# Patient Record
Sex: Female | Born: 1950 | Race: White | Hispanic: No | Marital: Married | Smoking: Former smoker
Health system: Southern US, Community
[De-identification: ages and names within clinical notes are randomized; demographics above are authoritative.]

## PROBLEM LIST (undated history)

## (undated) DIAGNOSIS — K589 Irritable bowel syndrome without diarrhea: Secondary | ICD-10-CM

## (undated) DIAGNOSIS — Z86010 Personal history of colonic polyps: Secondary | ICD-10-CM

## (undated) DIAGNOSIS — K529 Noninfective gastroenteritis and colitis, unspecified: Secondary | ICD-10-CM

## (undated) DIAGNOSIS — E119 Type 2 diabetes mellitus without complications: Secondary | ICD-10-CM

## (undated) DIAGNOSIS — A0472 Enterocolitis due to Clostridium difficile, not specified as recurrent: Secondary | ICD-10-CM

## (undated) DIAGNOSIS — I10 Essential (primary) hypertension: Secondary | ICD-10-CM

## (undated) DIAGNOSIS — I341 Nonrheumatic mitral (valve) prolapse: Secondary | ICD-10-CM

## (undated) DIAGNOSIS — E7800 Pure hypercholesterolemia, unspecified: Secondary | ICD-10-CM

## (undated) DIAGNOSIS — E1165 Type 2 diabetes mellitus with hyperglycemia: Secondary | ICD-10-CM

## (undated) DIAGNOSIS — F32A Depression, unspecified: Secondary | ICD-10-CM

## (undated) DIAGNOSIS — K7460 Unspecified cirrhosis of liver: Secondary | ICD-10-CM

## (undated) DIAGNOSIS — F329 Major depressive disorder, single episode, unspecified: Secondary | ICD-10-CM

## (undated) DIAGNOSIS — R600 Localized edema: Secondary | ICD-10-CM

## (undated) DIAGNOSIS — M1990 Unspecified osteoarthritis, unspecified site: Secondary | ICD-10-CM

## (undated) DIAGNOSIS — D696 Thrombocytopenia, unspecified: Secondary | ICD-10-CM

## (undated) DIAGNOSIS — B9681 Helicobacter pylori [H. pylori] as the cause of diseases classified elsewhere: Secondary | ICD-10-CM

## (undated) DIAGNOSIS — N816 Rectocele: Secondary | ICD-10-CM

## (undated) DIAGNOSIS — K2100 Gastro-esophageal reflux disease with esophagitis, without bleeding: Secondary | ICD-10-CM

## (undated) DIAGNOSIS — D649 Anemia, unspecified: Secondary | ICD-10-CM

## (undated) DIAGNOSIS — K5900 Constipation, unspecified: Secondary | ICD-10-CM

## (undated) DIAGNOSIS — K2970 Gastritis, unspecified, without bleeding: Secondary | ICD-10-CM

## (undated) DIAGNOSIS — T7840XA Allergy, unspecified, initial encounter: Secondary | ICD-10-CM

## (undated) DIAGNOSIS — F419 Anxiety disorder, unspecified: Secondary | ICD-10-CM

## (undated) DIAGNOSIS — D509 Iron deficiency anemia, unspecified: Secondary | ICD-10-CM

## (undated) DIAGNOSIS — E785 Hyperlipidemia, unspecified: Secondary | ICD-10-CM

## (undated) HISTORY — DX: Enterocolitis due to Clostridium difficile, not specified as recurrent: A04.72

## (undated) HISTORY — DX: Anxiety disorder, unspecified: F41.9

## (undated) HISTORY — DX: Pure hypercholesterolemia, unspecified: E78.00

## (undated) HISTORY — DX: Personal history of colonic polyps: Z86.010

## (undated) HISTORY — DX: Gastro-esophageal reflux disease with esophagitis, without bleeding: K21.00

## (undated) HISTORY — DX: Gastritis, unspecified, without bleeding: K29.70

## (undated) HISTORY — DX: Allergy, unspecified, initial encounter: T78.40XA

## (undated) HISTORY — PX: TUBAL LIGATION: SHX77

## (undated) HISTORY — DX: Thrombocytopenia, unspecified: D69.6

## (undated) HISTORY — DX: Unspecified cirrhosis of liver: K74.60

## (undated) HISTORY — DX: Type 2 diabetes mellitus without complications: E11.9

## (undated) HISTORY — DX: Helicobacter pylori (H. pylori) as the cause of diseases classified elsewhere: B96.81

## (undated) HISTORY — DX: Iron deficiency anemia, unspecified: D50.9

## (undated) HISTORY — PX: BILATERAL SALPINGOOPHORECTOMY: SHX1223

## (undated) HISTORY — PX: COLONOSCOPY: SHX174

## (undated) HISTORY — DX: Constipation, unspecified: K59.00

## (undated) HISTORY — DX: Rectocele: N81.6

## (undated) HISTORY — DX: Type 2 diabetes mellitus with hyperglycemia: E11.65

## (undated) HISTORY — PX: ESOPHAGOGASTRODUODENOSCOPY: SHX1529

## (undated) HISTORY — DX: Noninfective gastroenteritis and colitis, unspecified: K52.9

## (undated) HISTORY — DX: Major depressive disorder, single episode, unspecified: F32.9

## (undated) HISTORY — DX: Localized edema: R60.0

## (undated) HISTORY — PX: ABDOMINAL HYSTERECTOMY: SHX81

## (undated) HISTORY — DX: Unspecified osteoarthritis, unspecified site: M19.90

## (undated) HISTORY — DX: Irritable bowel syndrome, unspecified: K58.9

## (undated) HISTORY — DX: Depression, unspecified: F32.A

## (undated) HISTORY — DX: Anemia, unspecified: D64.9

## (undated) HISTORY — DX: Nonrheumatic mitral (valve) prolapse: I34.1

## (undated) HISTORY — DX: Hyperlipidemia, unspecified: E78.5

---

## 2001-07-29 ENCOUNTER — Encounter: Payer: Self-pay | Admitting: Specialist

## 2001-07-29 ENCOUNTER — Ambulatory Visit (HOSPITAL_COMMUNITY): Admission: RE | Admit: 2001-07-29 | Discharge: 2001-07-29 | Payer: Self-pay | Admitting: Specialist

## 2002-09-08 ENCOUNTER — Ambulatory Visit (HOSPITAL_COMMUNITY): Admission: RE | Admit: 2002-09-08 | Discharge: 2002-09-08 | Payer: Self-pay | Admitting: Specialist

## 2002-09-08 ENCOUNTER — Encounter: Payer: Self-pay | Admitting: Specialist

## 2004-05-29 ENCOUNTER — Ambulatory Visit (HOSPITAL_COMMUNITY): Admission: RE | Admit: 2004-05-29 | Discharge: 2004-05-29 | Payer: Self-pay | Admitting: *Deleted

## 2005-01-26 ENCOUNTER — Ambulatory Visit (HOSPITAL_COMMUNITY): Admission: RE | Admit: 2005-01-26 | Discharge: 2005-01-26 | Payer: Self-pay | Admitting: Pulmonary Disease

## 2005-06-14 ENCOUNTER — Ambulatory Visit (HOSPITAL_COMMUNITY): Admission: RE | Admit: 2005-06-14 | Discharge: 2005-06-14 | Payer: Self-pay | Admitting: *Deleted

## 2005-09-28 ENCOUNTER — Ambulatory Visit (HOSPITAL_COMMUNITY): Admission: RE | Admit: 2005-09-28 | Discharge: 2005-09-28 | Payer: Self-pay | Admitting: Pulmonary Disease

## 2006-07-23 ENCOUNTER — Ambulatory Visit (HOSPITAL_COMMUNITY): Admission: RE | Admit: 2006-07-23 | Discharge: 2006-07-23 | Payer: Self-pay | Admitting: Obstetrics and Gynecology

## 2006-10-22 ENCOUNTER — Ambulatory Visit (HOSPITAL_COMMUNITY): Payer: Self-pay | Admitting: Psychiatry

## 2006-11-21 ENCOUNTER — Ambulatory Visit (HOSPITAL_COMMUNITY): Payer: Self-pay | Admitting: Psychiatry

## 2007-01-21 ENCOUNTER — Ambulatory Visit (HOSPITAL_COMMUNITY): Payer: Self-pay | Admitting: Psychiatry

## 2007-02-03 ENCOUNTER — Ambulatory Visit: Payer: Self-pay | Admitting: Internal Medicine

## 2007-02-04 ENCOUNTER — Ambulatory Visit (HOSPITAL_COMMUNITY): Payer: Self-pay | Admitting: Psychiatry

## 2007-03-04 ENCOUNTER — Ambulatory Visit (HOSPITAL_COMMUNITY): Payer: Self-pay | Admitting: Psychiatry

## 2007-04-24 ENCOUNTER — Ambulatory Visit (HOSPITAL_COMMUNITY): Payer: Self-pay | Admitting: Psychiatry

## 2007-04-30 ENCOUNTER — Ambulatory Visit (HOSPITAL_BASED_OUTPATIENT_CLINIC_OR_DEPARTMENT_OTHER): Admission: RE | Admit: 2007-04-30 | Discharge: 2007-04-30 | Payer: Self-pay | Admitting: *Deleted

## 2007-06-24 ENCOUNTER — Ambulatory Visit (HOSPITAL_COMMUNITY): Payer: Self-pay | Admitting: Psychiatry

## 2007-08-04 ENCOUNTER — Ambulatory Visit (HOSPITAL_COMMUNITY): Admission: RE | Admit: 2007-08-04 | Discharge: 2007-08-04 | Payer: Self-pay | Admitting: Obstetrics and Gynecology

## 2007-08-19 ENCOUNTER — Ambulatory Visit (HOSPITAL_COMMUNITY): Payer: Self-pay | Admitting: Psychiatry

## 2007-09-24 ENCOUNTER — Ambulatory Visit: Payer: Self-pay | Admitting: Internal Medicine

## 2007-09-24 LAB — CONVERTED CEMR LAB
ALT: 75 units/L — ABNORMAL HIGH (ref 0–35)
HCV Ab: NEGATIVE
Hepatitis B Surface Ag: NEGATIVE
Total Protein: 8 g/dL (ref 6.0–8.3)

## 2007-09-29 ENCOUNTER — Ambulatory Visit (HOSPITAL_COMMUNITY): Admission: RE | Admit: 2007-09-29 | Discharge: 2007-09-29 | Payer: Self-pay | Admitting: Internal Medicine

## 2007-10-21 ENCOUNTER — Ambulatory Visit (HOSPITAL_COMMUNITY): Payer: Self-pay | Admitting: Psychiatry

## 2007-10-22 ENCOUNTER — Ambulatory Visit: Payer: Self-pay | Admitting: Internal Medicine

## 2007-10-22 LAB — CONVERTED CEMR LAB
Albumin: 4 g/dL (ref 3.5–5.2)
Alkaline Phosphatase: 62 units/L (ref 39–117)
Bilirubin, Direct: 0.1 mg/dL (ref 0.0–0.3)
Total Bilirubin: 0.7 mg/dL (ref 0.3–1.2)

## 2007-11-17 ENCOUNTER — Ambulatory Visit: Payer: Self-pay | Admitting: Internal Medicine

## 2007-11-17 LAB — CONVERTED CEMR LAB
Hemoglobin: 13.2 g/dL (ref 12.0–15.0)
Lymphocytes Relative: 33.7 % (ref 12.0–46.0)
Monocytes Relative: 7.4 % (ref 3.0–11.0)
Neutrophils Relative %: 58.3 % (ref 43.0–77.0)
Platelets: 188 10*3/uL (ref 150–400)
RBC: 4.61 M/uL (ref 3.87–5.11)
RDW: 12.7 % (ref 11.5–14.6)
Total CK: 101 units/L (ref 7–177)
WBC: 7.7 10*3/uL (ref 4.5–10.5)

## 2007-11-25 ENCOUNTER — Ambulatory Visit: Payer: Self-pay | Admitting: Internal Medicine

## 2007-11-25 ENCOUNTER — Encounter: Payer: Self-pay | Admitting: Internal Medicine

## 2007-11-25 LAB — CONVERTED CEMR LAB
Alpha-1-Globulin: 3.1 % (ref 2.9–4.9)
Alpha-2-Globulin: 10.2 % (ref 7.1–11.8)
Beta Globulin: 7.4 % — ABNORMAL HIGH (ref 4.7–7.2)
Ceruloplasmin: 44 mg/dL (ref 21–63)
Gamma Globulin: 17.4 % (ref 11.1–18.8)

## 2008-01-09 ENCOUNTER — Encounter (HOSPITAL_COMMUNITY): Admission: RE | Admit: 2008-01-09 | Discharge: 2008-02-08 | Payer: Self-pay | Admitting: Oncology

## 2008-01-09 ENCOUNTER — Ambulatory Visit (HOSPITAL_COMMUNITY): Payer: Self-pay | Admitting: Oncology

## 2008-01-20 ENCOUNTER — Ambulatory Visit (HOSPITAL_COMMUNITY): Payer: Self-pay | Admitting: Psychiatry

## 2008-02-06 DIAGNOSIS — E119 Type 2 diabetes mellitus without complications: Secondary | ICD-10-CM | POA: Insufficient documentation

## 2008-02-06 DIAGNOSIS — F411 Generalized anxiety disorder: Secondary | ICD-10-CM | POA: Insufficient documentation

## 2008-02-06 DIAGNOSIS — G5600 Carpal tunnel syndrome, unspecified upper limb: Secondary | ICD-10-CM | POA: Insufficient documentation

## 2008-02-06 DIAGNOSIS — F329 Major depressive disorder, single episode, unspecified: Secondary | ICD-10-CM

## 2008-02-06 DIAGNOSIS — E663 Overweight: Secondary | ICD-10-CM

## 2008-02-06 DIAGNOSIS — E785 Hyperlipidemia, unspecified: Secondary | ICD-10-CM | POA: Insufficient documentation

## 2008-03-15 ENCOUNTER — Ambulatory Visit (HOSPITAL_COMMUNITY): Payer: Self-pay | Admitting: Oncology

## 2008-03-15 ENCOUNTER — Encounter (HOSPITAL_COMMUNITY): Admission: RE | Admit: 2008-03-15 | Discharge: 2008-04-14 | Payer: Self-pay | Admitting: Oncology

## 2008-04-20 ENCOUNTER — Ambulatory Visit (HOSPITAL_COMMUNITY): Payer: Self-pay | Admitting: Psychiatry

## 2008-07-13 ENCOUNTER — Ambulatory Visit (HOSPITAL_COMMUNITY): Payer: Self-pay | Admitting: Psychiatry

## 2008-09-07 ENCOUNTER — Ambulatory Visit (HOSPITAL_COMMUNITY): Admission: RE | Admit: 2008-09-07 | Discharge: 2008-09-07 | Payer: Self-pay | Admitting: Obstetrics & Gynecology

## 2008-09-09 ENCOUNTER — Ambulatory Visit (HOSPITAL_COMMUNITY): Payer: Self-pay | Admitting: Psychiatry

## 2008-11-09 ENCOUNTER — Ambulatory Visit (HOSPITAL_COMMUNITY): Payer: Self-pay | Admitting: Psychiatry

## 2009-02-08 ENCOUNTER — Ambulatory Visit (HOSPITAL_COMMUNITY): Payer: Self-pay | Admitting: Psychiatry

## 2009-02-21 DIAGNOSIS — E785 Hyperlipidemia, unspecified: Secondary | ICD-10-CM

## 2009-02-21 HISTORY — DX: Hyperlipidemia, unspecified: E78.5

## 2009-03-15 ENCOUNTER — Ambulatory Visit (HOSPITAL_COMMUNITY): Payer: Self-pay | Admitting: Psychiatry

## 2009-04-14 ENCOUNTER — Ambulatory Visit (HOSPITAL_COMMUNITY): Payer: Self-pay | Admitting: Psychiatry

## 2009-05-03 ENCOUNTER — Encounter: Payer: Self-pay | Admitting: Cardiology

## 2009-05-12 ENCOUNTER — Ambulatory Visit (HOSPITAL_COMMUNITY): Payer: Self-pay | Admitting: Psychiatry

## 2009-07-12 ENCOUNTER — Ambulatory Visit (HOSPITAL_COMMUNITY): Payer: Self-pay | Admitting: Psychiatry

## 2009-08-04 ENCOUNTER — Ambulatory Visit (HOSPITAL_COMMUNITY): Payer: Self-pay | Admitting: Psychiatry

## 2009-08-23 ENCOUNTER — Ambulatory Visit (HOSPITAL_COMMUNITY): Payer: Self-pay | Admitting: Psychiatry

## 2009-10-04 ENCOUNTER — Ambulatory Visit (HOSPITAL_COMMUNITY): Payer: Self-pay | Admitting: Psychiatry

## 2009-10-12 ENCOUNTER — Ambulatory Visit (HOSPITAL_COMMUNITY): Admission: RE | Admit: 2009-10-12 | Discharge: 2009-10-12 | Payer: Self-pay | Admitting: Pulmonary Disease

## 2009-11-03 ENCOUNTER — Ambulatory Visit (HOSPITAL_COMMUNITY): Payer: Self-pay | Admitting: Psychiatry

## 2009-11-28 ENCOUNTER — Ambulatory Visit (HOSPITAL_COMMUNITY): Admission: RE | Admit: 2009-11-28 | Discharge: 2009-11-28 | Payer: Self-pay | Admitting: Pulmonary Disease

## 2009-11-30 ENCOUNTER — Encounter: Payer: Self-pay | Admitting: Internal Medicine

## 2009-12-17 HISTORY — PX: OTHER SURGICAL HISTORY: SHX169

## 2010-01-10 ENCOUNTER — Ambulatory Visit (HOSPITAL_COMMUNITY): Payer: Self-pay | Admitting: Psychiatry

## 2010-03-15 ENCOUNTER — Encounter (INDEPENDENT_AMBULATORY_CARE_PROVIDER_SITE_OTHER): Payer: Self-pay | Admitting: Cardiology

## 2010-03-16 ENCOUNTER — Ambulatory Visit (HOSPITAL_COMMUNITY): Payer: Self-pay | Admitting: Psychiatry

## 2010-05-01 ENCOUNTER — Encounter: Payer: Self-pay | Admitting: Internal Medicine

## 2010-05-08 ENCOUNTER — Encounter: Payer: Self-pay | Admitting: Internal Medicine

## 2010-06-15 ENCOUNTER — Ambulatory Visit (HOSPITAL_COMMUNITY): Payer: Self-pay | Admitting: Psychiatry

## 2010-06-27 ENCOUNTER — Encounter (INDEPENDENT_AMBULATORY_CARE_PROVIDER_SITE_OTHER): Payer: Self-pay | Admitting: *Deleted

## 2010-06-27 ENCOUNTER — Ambulatory Visit: Payer: Self-pay | Admitting: Internal Medicine

## 2010-06-27 DIAGNOSIS — D509 Iron deficiency anemia, unspecified: Secondary | ICD-10-CM | POA: Insufficient documentation

## 2010-06-27 DIAGNOSIS — D696 Thrombocytopenia, unspecified: Secondary | ICD-10-CM | POA: Insufficient documentation

## 2010-08-18 ENCOUNTER — Ambulatory Visit: Payer: Self-pay | Admitting: Internal Medicine

## 2010-10-10 ENCOUNTER — Ambulatory Visit (HOSPITAL_COMMUNITY): Payer: Self-pay | Admitting: Psychiatry

## 2010-10-26 ENCOUNTER — Ambulatory Visit (HOSPITAL_COMMUNITY)
Admission: RE | Admit: 2010-10-26 | Discharge: 2010-10-26 | Payer: Self-pay | Source: Home / Self Care | Admitting: Obstetrics & Gynecology

## 2011-01-02 ENCOUNTER — Ambulatory Visit (HOSPITAL_COMMUNITY)
Admission: RE | Admit: 2011-01-02 | Discharge: 2011-01-02 | Payer: Self-pay | Source: Home / Self Care | Attending: Psychiatry | Admitting: Psychiatry

## 2011-01-07 ENCOUNTER — Encounter: Payer: Self-pay | Admitting: Obstetrics and Gynecology

## 2011-01-16 ENCOUNTER — Ambulatory Visit (HOSPITAL_COMMUNITY)
Admission: RE | Admit: 2011-01-16 | Discharge: 2011-01-16 | Payer: Self-pay | Source: Home / Self Care | Attending: Psychiatry | Admitting: Psychiatry

## 2011-01-18 NOTE — Procedures (Signed)
Summary: Upper Endoscopy  Patient: [name] Note: All result statuses are Final unless otherwise noted.  Tests: (1) Upper Endoscopy (EGD)   EGD Upper Endoscopy       DONE     [facility]     [address]     [place], [place]  [address]           ENDOSCOPY PROCEDURE REPORT           PATIENT:  [name] [name], [name] [name]  MR#:  [id number]     BIRTHDATE:  07-02-1951, 58 yrs. old  GENDER:  female           ENDOSCOPIST:  [name], MD, [facility]           PROCEDURE DATE:  08/18/2010     PROCEDURE:  EGD, diagnostic     ASA CLASS:  Class II     INDICATIONS:  iron deficiency anemia prior colonoscopy 2008     negative           MEDICATIONS:   Fentanyl 25 mcg IV, Versed 4 mg IV     TOPICAL ANESTHETIC:  Exactacain Spray           DESCRIPTION OF PROCEDURE:   After the risks benefits and     alternatives of the procedure were thoroughly explained, informed     consent was obtained.  The LB GIF-H180 [id number] endoscope was     introduced through the mouth and advanced to the second portion of     the duodenum, without limitations.  The instrument was slowly     withdrawn as the mucosa was fully examined.     <<PROCEDUREIMAGES>>           Erythema was found in the antrum. Streaky erythema seen.     Otherwise the examination was normal. Z-line at 40 cm.     Retroflexed views revealed no abnormalities.    The scope was then     withdrawn from the patient and the procedure completed.           COMPLICATIONS:  None           ENDOSCOPIC IMPRESSION:     1) Erythema in the antrum     2) Otherwise normal examination     RECOMMENDATIONS:     1) Continue ferrous sulfate     2) return to Dr. Juanetta Gosling sometime this month re: follow-up     anemia     3) she says she performed hemoccults but I have not received the     results from Dr. Juanetta Gosling' office; if these were + would repeat a     colonoscopy, if negative, no further work-up. Defer follow-up of     hemoccults to Dr. Juanetta Gosling     4)  NOTE: ferritin was only 30 in 2008 so not necessarily a new     problem           REPEAT EXAM:  as needed           [name], MD, [facility]           CC:  Shaune Pollack, MD, The Patient           n.     Rosalie Doctor:   [name] at 08/18/2010 08:49 AM           Cassandria Anger, [id number]  Note: An exclamation mark (!) indicates a result that was not dispersed into the flowsheet. Document Creation  Date: 08/18/2010 8:49 AM _______________________________________________________________________  (1) Order result status: Final Collection or observation date-time: 08/18/2010 08:38 Requested date-time:  Receipt date-time:  Reported date-time:  Referring Physician:   Ordering Physician: Stan Head [phone]) Specimen Source:  Source: Launa Grill Order Number: [id number] Lab site:

## 2011-01-18 NOTE — Letter (Signed)
Summary: Diabetic Instructions  [facility]  [address] [place], [place] [address]   Phone: [phone]  Fax: [phone]    JASHLEY YELLIN [birth year] MRN: [id number]   _X_   ORAL DIABETIC MEDICATION INSTRUCTIONS  The day before your procedure:   Take your diabetic pill as you do normally  The day of your procedure:   Do not take your diabetic pill    We will check your blood sugar levels during the admission process and again in Recovery before discharging you home

## 2011-01-18 NOTE — Assessment & Plan Note (Signed)
Summary: low iron level--ch.    History of Present Illness Visit Type: Follow-up Consult Primary GI MD: Stan Head MD [facility] Primary Provider: Kari Baars, MD Requesting Provider: Kari Baars, MD Chief Complaint: Iron def History of Present Illness:   Patient feels weak and tired due to her iron def. She denies any other complaints.   She has been found to be anemic and iron-deficient by Dr. Juanetta Gosling in the past few months. No bleeding or GI symptoms and is s/p hysterectomy. she probably does not consume much iron in her diet.  Colonoscopy and TTG Ab negative in 2008.  Started iron about 1 month ago lab f/u yesterday - results pending   GI Review of Systems      Denies abdominal pain, acid reflux, belching, bloating, chest pain, dysphagia with liquids, dysphagia with solids, heartburn, loss of appetite, nausea, vomiting, vomiting blood, weight loss, and  weight gain.        Denies anal fissure, black tarry stools, change in bowel habit, constipation, diarrhea, diverticulosis, fecal incontinence, heme positive stool, hemorrhoids, irritable bowel syndrome, jaundice, light color stool, liver problems, rectal bleeding, and  rectal pain. Preventive Screening-Counseling & Management  Alcohol-Tobacco     Smoking Status: quit      Drug Use:  no.      Current Medications (verified): 1)  Welchol 625 Mg Tabs (Colesevelam Hcl) .... Take 3 Tablets By Mouth Twice A Day With Meals 2)  Dicyclomine Hcl 20 Mg Tabs (Dicyclomine Hcl) .... Take One By Mouth Three Times A Day 3)  Lovaza 1 Gm Caps (Omega-3-Acid Ethyl Esters) .... Take One By Mouth Two Times A Day 4)  Metformin Hcl 500 Mg Tabs (Metformin Hcl) .... Take One By Mouth Once Daily and One By Mouth Two Times A Day Every Other Day 5)  Actos 15 Mg Tabs (Pioglitazone Hcl) .... Take One By Mouth Once Daily 6)  Atenolol 25 Mg Tabs (Atenolol) .... Take 1.5 Tablets By Mouth Once Daily 7)  Nexium 40 Mg Cpdr (Esomeprazole Magnesium) ....  Take One By Mouth Once Daily 8)  Effexor Xr 75 Mg Xr24h-Cap (Venlafaxine Hcl) .... Take One By Mouth in The Morning and Two By Mouth Every Night 9)  Zetia 10 Mg Tabs (Ezetimibe) .... Take One By Mouth Every Other Day 10)  Cinnamon 1000 Mg Caps (Cinnamon) .... Take Two By Mouth Once Daily 11)  Iron 325 (65 Fe) Mg Tabs (Ferrous Sulfate) .... Take One By Mouth Once Daily 12)  Co Q-10 200 Mg Caps (Coenzyme Q10) .... Take One By Mouth Once Daily 13)  Biotin 1000 Mcg Tabs (Biotin) .... Take One By Mouth Once Daily 14)  Allergy 4 Mg Tabs (Chlorpheniramine Maleate) .... Take One By Mouth Once Daily  Allergies (verified): 1)  ! Codeine  Past History:  Past Medical History: Anemia Fatty Liver suspected (abnl transaminases and echodense liver on [place]) Anxiety and Panic Disorder Depression Diabetes II Hyperlipidemia Mitral Valve Prolapse IBS  Past Surgical History: Carpal Tunnel Release Hysterectomy  Family History: maternal uncles had cancer unknown type  Family History of Heart Disease: maternal family No FH of Colon Cancer:  Social History: Patient is a former smoker.  Alcohol Use - no Illicit Drug Use - no Smoking Status:  quit Drug Use:  no  Review of Systems       All other ROS negative except as per HPI.   Vital Signs:  Patient profile:   60 year old female Height:      72  inches Weight:      147.2 pounds BMI:     26.17 Pulse rate:   78 / minute Pulse rhythm:   regular BP sitting:   122 / 52  (right arm) Cuff size:   regular  Vitals Entered By: Harlow Mares CMA Duncan Dull) (June 27, 2010 11:25 AM)  Physical Exam  General:  Well developed, well nourished, no acute distress. Eyes:  anicteric Lungs:  Clear throughout to auscultation. Heart:  Regular rate and rhythm; no murmurs, rubs,  or bruits. Abdomen:  Soft, nontender and nondistended. No masses, hepatosplenomegaly or hernias noted. Normal bowel sounds. Skin:  1 ? spider Psych:  Alert and cooperative. Normal  mood and affect.   Impression & Recommendations:  Problem # 1:  ANEMIA, IRON DEFICIENCY (ICD-280.9) Assessment New review labs from Dr. Juanetta Gosling after iron started ? if he has done hemoccults - she thinks she may have negative colonoscopy 2008 so not doing that now but would depend on work-up we do now and clinical course as to whether it is repeated   Orders: EGD (EGD)  Problem # 2:  THROMBOCYTOPENIA (ICD-287.5) Assessment: New raises ? of liver disease - has had abnormal transaminases (not now) and ? of fatty liver will see if signs of portal htn at EGD  Patient Instructions: 1)  We will see you at your procedure on 08/29/10. 2)  Please hold diabetic medications as directed. 3)  [facility] Patient Information Guide given to patient.  4)  Upper Endoscopy brochure given.  5)  The medication list was reviewed and reconciled.  All changed / newly prescribed medications were explained.  A complete medication list was provided to the patient / caregiver.  Appended Document: low iron level--ch. No hemoccults forwarded after record request, only same labs already had

## 2011-01-18 NOTE — Letter (Signed)
Summary: Fredirick Maudlin MD  Fredirick Maudlin MD   Imported By: Lester [place] 07/07/2010 09:01:06  _____________________________________________________________________  External Attachment:    Type:   Image     Comment:   External Document

## 2011-01-18 NOTE — Procedures (Signed)
Summary: Colonoscopy: Normal   Colonoscopy  Procedure date:  11/25/2007  Findings:      Results: Normal. Location:  [facility].    Procedures Next Due Date:    Colonoscopy: 11/2017  Patient Name: [name] [name], Whisman MRN: [id number] Procedure Procedures: Colonoscopy CPT: 40981.    with biopsy. CPT: Q5068410.  Personnel: Endoscopist: [name], MD, [facility] [place].  Exam Location: Exam performed in Outpatient Clinic. Outpatient  Patient Consent: Procedure, Alternatives, Risks and Benefits discussed, consent obtained, from patient. Consent was obtained by the RN.  Indications  Average Risk Screening Routine.  History  Current Medications: Patient is not currently taking Coumadin.  Allergies: Allergic to CODEINE, JANUVIA.  Pre-Exam Physical: Performed Nov 25, 2007. Cardio-pulmonary exam, Rectal exam, HEENT exam , Abdominal exam, Mental status exam WNL.  Comments: Pt. history reviewed/updated, physical exam performed prior to initiation of sedation? YES Exam Exam: Extent of exam reached: Cecum, extent intended: Cecum.  The cecum was identified by appendiceal orifice and IC valve. Patient position: on left side. Time to Cecum: 00:05:55. Time for Withdrawl: 00:10:41. Colon retroflexion performed. Images taken. ASA Classification: II. Tolerance: excellent.  Monitoring: Pulse and BP monitoring, Oximetry used. Supplemental O2 given.  Colon Prep Used MiraLax for colon prep. Prep results: excellent.  Sedation Meds: Patient assessed and found to be appropriate for moderate (conscious) sedation. Fentanyl 75 mcg. given IV. Versed 6 mg. given IV.  Findings - NORMAL EXAM: Ascending Colon to Rectum.  OTHER FINDING: ? polypoid change of ileo-cecal valve found in Cecum. Biopsy/Other Finding taken.   Assessment  Comments: 1) POSSIBLE POLYPOID CHANGES AT ILEOCECAL VALVE 2) OTHERWISE NORMAL EXAM 3) EXCELLENT PREP Events  Unplanned Interventions: No intervention was  required.  Plans Medication Plan: Await pathology.  Disposition: After procedure patient sent to recovery. After recovery patient sent home.  Scheduling/Referral: Await pathology to schedule patient. Path Letter, to The Patient,   Comments: 1. IF THERE IS A POLYP AT ILEOCECAL VALVE, DOUBT IT COULD BE SNARED. SUSPECT IT IS REALLY NORMAL VARIANT. AWAIT BIOPSIES.  2. AMA, SPEP, CERULOPLASMIN, ANTI-SMOOTH MUSCLE ANTIBODY TODAY TO FURTHER WORK-UP ABNL LFT'S (790.4)  CC:   Kari Baars, MD  This report was created from the original endoscopy report, which was reviewed and signed by the above listed endoscopist.

## 2011-01-18 NOTE — Letter (Signed)
Summary: EGD Instructions  [facility]  [address] [place], [place] [address]   Phone: [phone]  Fax: [phone]       [name] [name]    Jul 26, 1951    MRN: [id number]       Procedure Day Dorna Bloom: [name], 08/18/10     Arrival Time: 7:30 AM     Procedure Time: 8:30 AM     Location of Procedure:                    _X_  [facility] (4th Floor)  PREPARATION FOR ENDOSCOPY   On FRIDAY, 08/18/10 THE DAY OF THE PROCEDURE:  1.   No solid foods, milk or milk products are allowed after midnight the night before your procedure.  2.   Do not drink anything colored red or purple.  Avoid juices with pulp.  No orange juice.  3.  You may drink clear liquids until 6:30 AM, which is 2 hours before your procedure.                                                                                                CLEAR LIQUIDS INCLUDE: Water Jello Ice Popsicles Tea (sugar ok, no milk/cream) Powdered fruit flavored drinks Coffee (sugar ok, no milk/cream) Gatorade Juice: apple, white grape, white cranberry  Lemonade Clear bullion, consomm, broth Carbonated beverages (any kind) Strained chicken noodle soup Hard Candy   MEDICATION INSTRUCTIONS  Unless otherwise instructed, you should take regular prescription medications with a small sip of water as early as possible the morning of your procedure.  Diabetic patients - see separate instructions.                 OTHER INSTRUCTIONS  You will need a responsible adult at least 58 years of age to accompany you and drive you home.   This person must remain in the waiting room during your procedure.  Wear loose fitting clothing that is easily removed.  Leave jewelry and other valuables at home.  However, you may wish to bring a book to read or an iPod/MP3 player to listen to music as you wait for your procedure to start.  Remove all body piercing jewelry and leave at home.  Total time from sign-in until  discharge is approximately 2-3 hours.  You should go home directly after your procedure and rest.  You can resume normal activities the day after your procedure.  The day of your procedure you should not:   Drive   Make legal decisions   Operate machinery   Drink alcohol   Return to work  You will receive specific instructions about eating, activities and medications before you leave.    The above instructions have been reviewed and explained to me by   Francee Piccolo, CMA (AAMA)     I fully understand and can verbalize these instructions _____________________________ Date 06/27/10

## 2011-01-18 NOTE — Miscellaneous (Signed)
Summary: letter not picked up from 10/2008 results--returned from front   Clinical Lists Changes     Note returned to me today from Samples drawer at front desk that Ms. Cassels did not pick up results letter dated 10/19/2008 and plan for care.  Sent to med rec to be scanned.  [name] Dubin, PharmD

## 2011-02-13 ENCOUNTER — Encounter (INDEPENDENT_AMBULATORY_CARE_PROVIDER_SITE_OTHER): Payer: 59 | Admitting: Psychiatry

## 2011-02-13 DIAGNOSIS — F411 Generalized anxiety disorder: Secondary | ICD-10-CM

## 2011-03-01 LAB — GLUCOSE, CAPILLARY: Glucose-Capillary: 130 mg/dL — ABNORMAL HIGH (ref 70–99)

## 2011-03-13 ENCOUNTER — Encounter (INDEPENDENT_AMBULATORY_CARE_PROVIDER_SITE_OTHER): Payer: 59 | Admitting: Psychiatry

## 2011-03-13 DIAGNOSIS — F411 Generalized anxiety disorder: Secondary | ICD-10-CM

## 2011-05-01 NOTE — Assessment & Plan Note (Signed)
[facility]                               LIPID CLINIC NOTE   [name], PLACIDE                     MRN:          [id number]  DATE:11/18/2007                            DOB:          Nov 18, 1951    The patient is evaluated in the lipid clinic for hyperlipidemia in the  setting of abnormal LFT's.  She has been followed by Dr. Leone Payor most  recently and is scheduled for pending colonoscopy.  The patient has been  cleared to begin intermittent use of cholesterol lowering medications by  Dr. Leone Payor.  Therefore, we will begin Vytorin 10/40 one tablet  Monday/Wednesday/Friday.  As discussed with the patient today on the  phone at 3 o'clock.  The patient will call with muscle aches, pain,  weakness, fatigue, or other problems.  She will have liver functions  obtained as well as a cholesterol panel drawn in one month at [facility].  She will call me in three weeks so that I can send  her paper work.  We discussed sending it on now, however, we have  concerns that it will be lost.      [name] Dubin, PharmD, BCPS, CPP  Electronically Signed      Rollene Rotunda, MD, [facility]  Electronically Signed   MP/MedQ  DD: 11/18/2007  DT: 11/19/2007  Job #: [id number]   cc:   [name], MD,FACG  Rollene Rotunda, MD, [facility]  Oneal Deputy. Juanetta Gosling, M.D.

## 2011-05-01 NOTE — Assessment & Plan Note (Signed)
[facility]                         GASTROENTEROLOGY OFFICE NOTE   [name], [name]                     MRN:          [id number]  DATE:10/22/2007                            DOB:          Jun 19, 1951    CHIEF COMPLAINT:  Follow-up of abnormal LFTs.   See my note of September 24, 2007, for full problem list.   In the interim I rechecked the labs, which showed persistently elevated  LFTs.  AST 67, ALT 75.  Her ferritin was 30, which is low normal.  She  had a negative hepatitis B surface antigen, C antibody, and antinuclear  antibody.  An ultrasound showed gallstones and she had echodense liver,  likely representing hepatic steatosis.  We went over these results  today.   MEDICATIONS:  1. Serax 10 mg b.i.d.  2. Metformin 500 mg daily.  3. Effexor XR 150 mg daily.  4. Nexium 40 mg daily.  5. Atenolol 25 mg daily.   She is sensitive to or allergic to CODEINE, JANUVIA or ACTOS.   The metformin she takes is b.i.d. but she had to reduce it to daily  because of diarrhea.  She still has a lot of bloating and gaseousness,  is wanting to talk about wondering if Beano would help.  A trial of  Align did not help with these problems.   ASSESSMENT:  1. Abnormal transaminases, likely due to steatosis versus      steatohepatitis.  Liver synthetic function and everything appears      to be good at this time.  2. Bloating, abdominal pain, probably irritable bowel syndrome.  The      possibility of celiac disease comes to mind, particularly since it      can cause abnormal transaminases as well.  3. She needs a screening colonoscopy (the abdominal symptoms have been      chronic).  4. Hyperlipidemia.  She needs therapy, it seems.   PLAN:  1. Recheck LFTs, check tissue transglutaminase antibody, check IgA      level today.  2. She can try Beano.  We will consider antibiotics versus other      treatment for irritable bowel syndrome unless her celiac  antibodies      are positive.  3. Check a CBC today as well (note, over time over the years, if she      ends up with what we think is steatohepatitis or steatosis, she      should have yearly CBCs to check her platelet count as well as      follow her LFTs periodically, at least once a year if not more,      depending upon which medication she is on).  A low platelet count      can be indicative of cirrhosis, which is a risk.  4. She should try to lose some weight, though she is really just      barely overweight.  5. Consider antispasmodics for irritable bowel symptoms as well.  6. Regarding her dyslipidemia treatment, it may be worthwhile to      consider more intermittent therapy rather  than daily therapy, i.e.,      take Vytorin three or four times a week.  I have seen some small      studies that indicate intermittent therapy with these or perhaps      statins (I believe the clinical series involved using a statin once      a week with minimal side effects but good results).  Obviously,      these are not randomized control trials but in this lady who is      intolerant of statins and had three times abnormal transaminases on      Vytorin, it might be worth a try.   Let's wait and see what the rest of my workup shows.  If she has  positive celiac antibodies, we will need an EGD with biopsy to confirm.  Otherwise, we will set up a follow-up or have her call back for a  colonoscopy when she is ready.  She did not really want to do a  screening colonoscopy yet.   More to follow pending these labs.     [name] Boop, MD,FACG  Electronically Signed    CEG/MedQ  DD: 10/22/2007  DT: 10/23/2007  Job #: [id number]   cc:   [name] Dubin, PharmD, BCPS, CPP  Oneal Deputy. Juanetta Gosling, M.D.

## 2011-05-01 NOTE — Assessment & Plan Note (Signed)
[facility]                         GASTROENTEROLOGY OFFICE NOTE   [name], [name]                     MRN:          [id number]  DATE:11/25/2007                            DOB:          04/07/1951    Prior to the patient's colonoscopy, we went over some of her lab work.  On November 17, 2007, she had a normal CBC and CPK.  Her tissue  transglutaminase antibody is normal.  She does not appear to have sprue.  Her IgG level was normal as well.  Her AST was 82 with top normal 37 and  ALT 97 with top normal 35 on November 5.  These were slightly higher  than previous.  Her hepatitis B surface antigen, C-antibody, antinuclear  antibody is negative.  Her ferritin was actually borderline at 30.  Her  CBC was normal.  Technically her ferritin is low normal.  It seems to me  that her problem is likely fatty liver.  She is going back on some  intermittent Vytorin, I think twice a week and she is going to have her  LFT's followed with [name] Dubin, PharmD, BCPS, CPP.  I think that is a  good plan for right now.  She is on metformin as well.  There is no  proven therapy for fatty liver.  I did discuss the option of going to  [facility] for an evaluation.  We could consider other serologic testing at  this time.  I would like to see the copies of her LFT's over time to  weigh in how much of this is from a lipid lowering agent versus  intrinsic issue.  I will plan follow-up based upon that.  It may come to  a liver biopsy.  We will hold off on that at this time as I am not sure  that would change clinical management.  I will go ahead and check  ceruloplasmin, antismooth muscle antibody, antimitochondrial antibody  today, as well as an SPEP.  This will complete the serologic workup,  though, I suspect those will be normal.  I think it is important to know  that.     [name], MD,FACG  Electronically Signed    CEG/MedQ  DD: 11/25/2007  DT: 11/25/2007  Job  #: [id number]   cc:   [name] Dubin, PharmD, BCPS, CPP  Oneal Deputy. Juanetta Gosling, M.D.

## 2011-05-01 NOTE — Assessment & Plan Note (Signed)
[facility]                         GASTROENTEROLOGY OFFICE NOTE   [name] [name], [name] [name]                     MRN:          [id number]  DATE:09/24/2007                            DOB:          05-Feb-1951    REFERRING PHYSICIANS:  Ramon Dredge L. Juanetta Gosling, M.D.  Also [name] Dubin,  PharmD, BCPS, CPP.   REASON FOR CONSULTATION:  Abnormal LFTs.   ASSESSMENT:  A 60 year old white woman with abnormal transaminases  greater than 2 times normal, but not greater than 3 times normal.  This  is in the setting of Vytorin therapy.  Possibilities include medication  reaction, certainly underlying fatty liver and nonalcoholic  steatohepatitis is possible, and she does seem to meet at least some of  the criteria for metabolic syndrome.   PLAN:  1. Repeat LFTs while she is off the Vytorin.  (They were trending      downward and she has not had them done for several months.)  2. Check an ANA, ferritin level, hepatitis B surface antigen,      hepatitis C antibody.  3. Check an ultrasound.  4. Consider checking a celiac panel.  That could be a cause of      abnormal LFTs, but will hold off on that now.  5. She has irritable bowel symptoms with chronic alternating bowel      habits.  I am going to put her on Align to see if that helps with      her bloating and gaseousness.  6. At some point, she should have a screening colonoscopy.  She does      not want to do that right now, but I will see her back in a month      and we will review all of these things and try to set that up as      well.   HISTORY:  This 60 year old white woman has been treated for  hyperlipidemia over the years.  She has been on multiple Statins and  cannot tolerate them.  She was on Vytorin earlier this year and had LFTs  showing an AST of 92, ALT 84 with top normals of 37 and 35.  That was on  April 17.  Her Vytorin was stopped.  Note that she had an ALT of 100 and  an AST of 74 a week prior to  that.  Subsequently, on May 5, her AST was  106, ALT 98, and then it fell to 64 and 77 respectively on July 8.  Note, she did have LFTs up to 3 times abnormal it seems, although not  quite.  She feels well, other than these irritable bowel complaints as  described.  There is no family history of liver disease.  She does not  use significant amounts of alcohol.  In fact, she does not drink.  There  is no chronic acetaminophen use.  She has not had exposure to  transfusions or problems with needle use, et Karie Soda.  I.e., there are no  significant liver disease risk factors that I can tell.   MEDICATIONS:  1. Serax 10 mg twice  a day.  2. Metformin 500 mg daily.  3. Effexor XR 150 mg daily.  4. Nexium 40 mg daily.  5. Atenolol 25 mg daily.   DRUG ALLERGIES:  CODEINE.  JANUVIA.  ACTOS.   PAST MEDICAL HISTORY:  1. Dyslipidemia.  2. Diabetes mellitus.  3. Overweight.  4. Anxiety and panic disorder.  5. Depression.  6. Prior tubal ligation.  7. Mitral valve prolapse, diagnosed by Dr. Daphene Jaeger in the past.  8. Previous carpal tunnel release.   FAMILY HISTORY:  See medical review form.  Grandparents have had heart  disease, alcoholism.  No colon cancer.  No liver disease is noted.   SOCIAL HISTORY:  She is married.  She is a housewife.  Two sons.  Lives  with her husband.  No alcohol, tobacco, or drugs.   REVIEW OF SYSTEMS:  Positive for insomnia.  All other systems are  negative.   PHYSICAL EXAM:  Shows her to be 5 feet 3 inches, weight 142 pounds, with  a body mass index of 25.2, which puts her in the overweight category.  Blood pressure 112/62, pulse 64.  EYES:  Anicteric.  ENT:  Normal mouth and posterior oropharynx.  NECK:  Supple.  No thyromegaly or mass.  CHEST:  Clear.  HEART:  S1, S2.  No murmurs, rubs, or gallops.  ABDOMEN:  Notable for slightly increased abdominal girth.  No  organomegaly or mass detected.  Nontender.  LYMPHATICS:  No neck or supraclavicular nodes.   No groin adenopathy.  LOWER EXTREMITIES:  Free of edema.  SKIN:  I see no striae or palmar erythema, or spider angioma.   I have reviewed the office notes we have here.  Labs as well.   I appreciate the opportunity to care for this patient.     [name], MD,FACG  Electronically Signed   CEG/MedQ  DD: 09/24/2007  DT: 09/25/2007  Job #: [id number]   cc:   Ramon Dredge L. Juanetta Gosling, M.D.  [name] Dubin, PharmD, BCPS, CPP

## 2011-05-01 NOTE — Assessment & Plan Note (Signed)
[facility]                               LIPID CLINIC NOTE   ALVINE, MOSTAFA                       MRN:          [id number]  DATE:07/13/2008                            DOB:          Dec 11, 1951    The patient follows in the Lipid Clinic.  She had labs drawn last  Thursday and I called her about her LFTs on Thursday evening.  They were  stable in the 1-1/2 to 2-1/2 times the upper range of normal values.  She has not been on any cholesterol medications since prior to this  test.  I received her lipid labs today that include total cholesterol of  322, triglyceride 186, HDL 52, LDL 233.  I called the patient tonight at  1810.  We discussed these labs and our options.  As she has been tried  on many other statins, we will try Welchol 6 tablets a day divided into  two doses with meals for a month.  I have called this to Scott at [facility]  in [place] for her request.  She will pick up a month's supply with  one refill.  She will have her labs checked in a month.  I will send the  lab order on or around August 07, 2008, to our [facility] for her  to have this blood work done at [facility] and call her when those labs  are checked.      [name] Dubin, PharmD, BCPS, CPP  Electronically Signed      Jesse Sans. Daleen Squibb, MD, [facility]  Electronically Signed   MP/MedQ  DD: 07/13/2008  DT: 07/14/2008  Job #: [id number]

## 2011-05-04 NOTE — Assessment & Plan Note (Signed)
[facility]                               LIPID CLINIC NOTE   [name] [name], [name] [name]                     MRN:          [id number]  DATE:05/08/2007                            DOB:          1951/12/01    TELEPHONE NOTE:   I spoke with the patient on the telephone at 4:30 in the afternoon on  May 08, 2007. [name] [name] had repeat LFT values completed at the  beginning of the month. These were only received today. Patient has been  feeling and doing well. She did have recent carpal tunnel surgery and  has not been taking any cholesterol lowering medicine at this time.  Liver function tests reveal normal LFT with the exception of an AST at  106, ALT of 98. In discussion with Dr. Antoine Poche I am concerned that the  patient's required GI consult in order to further assess and ascertain  the reason for her AST and ALT elevations. As the patient has not had  cholesterol medications in sometime, I am concerned in no particular  order for gallstones or fatty liver.  I will call to Dr. Juanetta [name] office  determine if GI consult would be appropriate based on Dr. Juanetta [name]  assessment. I have left voicemail for his office today and will follow  up in the next 24 hours. I appreciate the opportunity to continue to  care for this kind patient.      [name] Dubin, PharmD, BCPS, CPP  Electronically Signed      Rollene Rotunda, MD, [facility]  Electronically Signed   MP/MedQ  DD: 05/09/2007  DT: 05/09/2007  Job #: [id number]   cc:   [name] [name], M.D.

## 2011-05-04 NOTE — Assessment & Plan Note (Signed)
[facility]                               LIPID CLINIC NOTE   TRISTAN, BRAMBLE                       MRN:          [id number]  DATE:04/04/2007                            DOB:          04-23-1951    REFERRING PHYSICIAN:  Ramon Dredge L. Juanetta Gosling, M.D.   TIME OF CALL:  11:20 in the morning.   Ms. Nading supervising physician is Dr. Rollene Rotunda.   Ms. Doring is called after her lipid clinic followup blood work was  completed yesterday evening.   Briefly, she has had elevated LFTs to approximately 2.5 times the upper  limit of normal found last week on Vytorin 10/20 one tablet daily. A  repeat liver panel was obtained this week to assess further delineation  of passing illness versus more chronic liver function abnormality. The  LFTs revealed normal values with the exception of AST which was slightly  elevated at 92 and ALT slightly elevated at 84.   CURRENT MEDICATIONS:  The patient's current medications have been  reviewed and they include:  1. Effexor 75 mg daily.  2. Atenolol 25 mg daily.  3. Nexium 40 mg daily.  4. Serax twice daily.  5. Vytorin 10/20 one tablet daily.   I discussed these labs and the findings with the patient today at 11:20  in the morning. The patient will:  1. Discontinue her Vytorin 10/20 one tablet daily.  2. She will recheck her liver test in 2 weeks.   I have mailed a copy of these labs and lab requests to the patient this  morning. She will call me with problems and verify that she is not  having bad feelings and pain or darkness of urine or other symptoms.  Followup is scheduled with lab draws in [place] in 2 weeks.     [name] Dubin, PharmD, BCPS, CPP  Electronically Signed    MP/MedQ  DD: 04/04/2007  DT: 04/04/2007  Job #: [id number]

## 2011-05-04 NOTE — Op Note (Signed)
NAME:  [name], [name] 04/30/2007         ACCOUNT NO.:  [id number]   MEDICAL RECORD NO.:  [id number]          PATIENT TYPE:  AMB   LOCATION:  DSC                          FACILITY:  [facility]   PHYSICIAN:  Tennis Must Meyerdierks, M.D.DATE OF BIRTH:  [birth year]   DATE OF PROCEDURE:  DATE OF DISCHARGE:                               OPERATIVE REPORT   PREOPERATIVE DIAGNOSIS:  Left carpal tunnel syndrome.   POSTOPERATIVE DIAGNOSIS:  Left carpal tunnel syndrome.   PROCEDURE:  Decompression, median nerve, left carpal tunnel.   SURGEON:  Lowell Bouton, M.D.   ANESTHESIA:  0.5% Marcaine local with sedation.   OPERATIVE FINDINGS:  The patient had a very narrow carpal canal with  significant pressure on the nerve.  The motor branch was intact and  there were no masses in the carpal tunnel.   PROCEDURE:  Under 0.5% Marcaine local anesthesia with a tourniquet on  the left arm, the left hand was prepped and draped in the usual fashion  and after exsanguinating the limb, the tourniquet was inflated to 250  mmHg.  A 3 cm longitudinal incision was made in the palm just ulnar to  the thenar crease and carried down through the subcutaneous tissues.  Blunt dissection was carried through the superficial palmar fascia and  the carpal canal was identified using a hemostat up against the hook of  the hamate.  The transverse carpal ligament was then divided on the  ulnar border of the median nerve.  The proximal end of the ligament was  divided with the scissors after dissecting the nerve away from the  undersurface of the ligament.  The carpal canal was then palpated and  was found to be adequately decompressed.  The nerve was examined and the  motor branch was identified.  The wound was irrigated with saline.  The  skin was closed with 4-0 nylon sutures.  Sterile dressings were applied,  followed by a volar wrist splint.  The patient tolerated the procedure  well and went to the recovery room  awake and stable in good condition.      Lowell Bouton, M.D.  Electronically Signed     EMM/MEDQ  D:  04/30/2007  T:  04/30/2007  Job:  [id number]   cc:   Ramon Dredge L. Juanetta Gosling, M.D.

## 2011-05-15 ENCOUNTER — Encounter (INDEPENDENT_AMBULATORY_CARE_PROVIDER_SITE_OTHER): Payer: 59 | Admitting: Psychiatry

## 2011-05-15 DIAGNOSIS — F411 Generalized anxiety disorder: Secondary | ICD-10-CM

## 2011-07-10 ENCOUNTER — Encounter (INDEPENDENT_AMBULATORY_CARE_PROVIDER_SITE_OTHER): Payer: 59 | Admitting: Psychiatry

## 2011-07-10 DIAGNOSIS — F411 Generalized anxiety disorder: Secondary | ICD-10-CM

## 2011-09-04 ENCOUNTER — Encounter (INDEPENDENT_AMBULATORY_CARE_PROVIDER_SITE_OTHER): Payer: 59 | Admitting: Psychiatry

## 2011-09-04 DIAGNOSIS — F411 Generalized anxiety disorder: Secondary | ICD-10-CM

## 2011-09-06 LAB — IRON AND TIBC
Saturation Ratios: 14 — ABNORMAL LOW
TIBC: 430
UIBC: 368

## 2011-09-06 LAB — CBC
HCT: 36.1
Hemoglobin: 12.1
MCV: 81.7
RDW: 13.8
WBC: 5.9

## 2011-09-06 LAB — COMPREHENSIVE METABOLIC PANEL
AST: 82 — ABNORMAL HIGH
Albumin: 4
Alkaline Phosphatase: 67
CO2: 26
Chloride: 103
GFR calc non Af Amer: >60
Total Protein: 8

## 2011-09-06 LAB — FERRITIN: Ferritin: 26 (ref 10–291)

## 2011-09-10 LAB — CBC
MCHC: 34.9
WBC: 4.8

## 2011-09-10 LAB — IRON AND TIBC
Saturation Ratios: 22
UIBC: 288

## 2011-10-30 ENCOUNTER — Ambulatory Visit (INDEPENDENT_AMBULATORY_CARE_PROVIDER_SITE_OTHER): Payer: 59 | Admitting: Psychiatry

## 2011-10-30 ENCOUNTER — Other Ambulatory Visit (HOSPITAL_COMMUNITY): Payer: Self-pay | Admitting: Psychiatry

## 2011-10-30 ENCOUNTER — Encounter (HOSPITAL_COMMUNITY): Payer: 59 | Admitting: Psychiatry

## 2011-10-30 ENCOUNTER — Encounter (HOSPITAL_COMMUNITY): Payer: Self-pay | Admitting: Psychiatry

## 2011-10-30 VITALS — Wt 147.2 lb

## 2011-10-30 DIAGNOSIS — F411 Generalized anxiety disorder: Secondary | ICD-10-CM

## 2011-10-30 DIAGNOSIS — F419 Anxiety disorder, unspecified: Secondary | ICD-10-CM

## 2011-10-30 MED ORDER — PAXIL 40 MG PO TABS: ORAL_TABLET | ORAL | Status: DC

## 2011-10-30 NOTE — Progress Notes (Signed)
Patient came today for a followup appointment she's been taking Paxil with much improvement. She's been sleeping good and recently there are no severe panic attacks noted. She denies any swelling or shakes with the medication. She has no plans for Thanksgiving but hoping that she will see the family members on that day. She denies any crying spells agitation mood swing or anger. She denies any paranoid thinking or reported any side effects of medication.  Mental status examination. Patient is somewhat anxious but cooperative and maintained good eye contact her speech is soft clear and coherent. Her thought process is logical linear and goal-directed. There were no psychotic symptoms present. Her attention and concentration is okay. She is alert and oriented x3. She denies any auditory hallucinations suicidal thoughts or homicidal thoughts. Her insight judgment and pulse control is okay.  Assessment anxiety disorder  Plan continue brand name Paxil 60 mg daily. I have explained the risks and benefits of medication in detail I will see her again in 3 months.

## 2011-11-19 ENCOUNTER — Other Ambulatory Visit (HOSPITAL_COMMUNITY): Payer: Self-pay | Admitting: Psychiatry

## 2011-11-25 ENCOUNTER — Other Ambulatory Visit (HOSPITAL_COMMUNITY): Payer: Self-pay | Admitting: Psychiatry

## 2011-11-27 ENCOUNTER — Other Ambulatory Visit (HOSPITAL_COMMUNITY): Payer: Self-pay | Admitting: Psychiatry

## 2011-12-13 ENCOUNTER — Ambulatory Visit (HOSPITAL_COMMUNITY): Payer: 59

## 2011-12-13 ENCOUNTER — Ambulatory Visit (HOSPITAL_COMMUNITY)
Admission: RE | Admit: 2011-12-13 | Discharge: 2011-12-13 | Disposition: A | Discharge status: Home / Self Care | Payer: 59 | Source: Ambulatory Visit | Attending: Cardiovascular Disease | Admitting: Cardiovascular Disease

## 2011-12-13 DIAGNOSIS — I359 Nonrheumatic aortic valve disorder, unspecified: Secondary | ICD-10-CM | POA: Insufficient documentation

## 2011-12-13 DIAGNOSIS — E119 Type 2 diabetes mellitus without complications: Secondary | ICD-10-CM | POA: Insufficient documentation

## 2011-12-13 DIAGNOSIS — E785 Hyperlipidemia, unspecified: Secondary | ICD-10-CM | POA: Insufficient documentation

## 2011-12-13 NOTE — Progress Notes (Signed)
*  PRELIMINARY RESULTS* Echocardiogram 2D Echocardiogram has been performed.  Conrad [place] 12/13/2011, 10:37 AM

## 2012-01-01 ENCOUNTER — Ambulatory Visit (INDEPENDENT_AMBULATORY_CARE_PROVIDER_SITE_OTHER): Payer: 59 | Admitting: Psychiatry

## 2012-01-01 ENCOUNTER — Ambulatory Visit (HOSPITAL_COMMUNITY): Payer: 59 | Admitting: Psychiatry

## 2012-01-01 DIAGNOSIS — F411 Generalized anxiety disorder: Secondary | ICD-10-CM

## 2012-01-01 DIAGNOSIS — F419 Anxiety disorder, unspecified: Secondary | ICD-10-CM

## 2012-01-01 MED ORDER — PAXIL 40 MG PO TABS: ORAL_TABLET | ORAL | Status: DC

## 2012-01-01 NOTE — Progress Notes (Signed)
Patient came today for a followup appointment. She has a very good Christmas. She is taking Paxil brand name which is helping her. She denies any recent nervousness or panic attack. She did very well around family member on Christmas to. She is sleeping did and reported no concern or side effects of medication. She denies any crying spells agitation mood swing or anger. She denies any paranoid thinking.   Mental status examination.  Patient is pleasant cooperative and maintained good eye contact. She is well-groomed and well-dressed. She maintained good eye contact. At times she appeared anxious but overall she is well related in conversation. Her speech is soft clear and coherent. Her thought process logical linear and goal-directed. Her attention and concentration is okay. She denies any auditory or visual hallucination. There no psychotic symptoms present. She's alert and oriented x3 and her insight judgment and impulse control is okay.   Assessment  Anxiety disorder  Plan  continue brand name Paxil 60 mg daily. I have explained the risks and benefits of medication in detail I will see her again in 2 months.

## 2012-01-16 ENCOUNTER — Other Ambulatory Visit: Payer: Self-pay | Admitting: Adult Health

## 2012-01-16 DIAGNOSIS — Z139 Encounter for screening, unspecified: Secondary | ICD-10-CM

## 2012-01-22 ENCOUNTER — Other Ambulatory Visit (HOSPITAL_COMMUNITY): Payer: Self-pay | Admitting: Psychiatry

## 2012-01-22 ENCOUNTER — Ambulatory Visit (HOSPITAL_COMMUNITY): Admission: RE | Admit: 2012-01-22 | Payer: 59 | Source: Ambulatory Visit

## 2012-02-14 ENCOUNTER — Other Ambulatory Visit: Payer: Self-pay | Admitting: Adult Health

## 2012-02-14 DIAGNOSIS — Z139 Encounter for screening, unspecified: Secondary | ICD-10-CM

## 2012-02-19 ENCOUNTER — Ambulatory Visit (HOSPITAL_COMMUNITY)
Admission: RE | Admit: 2012-02-19 | Discharge: 2012-02-19 | Disposition: A | Discharge status: Home / Self Care | Payer: 59 | Source: Ambulatory Visit | Attending: Adult Health | Admitting: Adult Health

## 2012-02-19 DIAGNOSIS — Z1231 Encounter for screening mammogram for malignant neoplasm of breast: Secondary | ICD-10-CM | POA: Insufficient documentation

## 2012-02-19 DIAGNOSIS — Z139 Encounter for screening, unspecified: Secondary | ICD-10-CM

## 2012-02-26 ENCOUNTER — Ambulatory Visit (INDEPENDENT_AMBULATORY_CARE_PROVIDER_SITE_OTHER): Payer: 59 | Admitting: Psychiatry

## 2012-02-26 ENCOUNTER — Encounter (HOSPITAL_COMMUNITY): Payer: Self-pay | Admitting: Psychiatry

## 2012-02-26 DIAGNOSIS — F419 Anxiety disorder, unspecified: Secondary | ICD-10-CM

## 2012-02-26 DIAGNOSIS — F411 Generalized anxiety disorder: Secondary | ICD-10-CM

## 2012-02-26 MED ORDER — PAXIL 40 MG PO TABS: ORAL_TABLET | ORAL | Status: DC

## 2012-02-26 NOTE — Progress Notes (Signed)
Chief complaint I'm doing better on the medication  History of present illness Patient is 61 year old married Caucasian female who came for her followup appointment. She has been compliant with her Paxil 60 mg. She reported no side effects of medication. She reported less anxious, less depressed and denies any severe panic attack. She sleeps 6-7 hours without any problem. She has been taking brand name Paxil which is helping her a lot. She denies any agitation anger or mood swings. She denies any crying spells. She denies any drinking or using any illegal drugs.  Current psychiatric medication Paxil 60 mg daily Oxazepam 10 mg only as needed prescribed by primary care physician Dr. Ollen Bowl.  Medical history Hyperlipidemia, hypertension and diabetes.  Psychosocial history Patient lives with her husband. She has been married for 42 years. She has 2 children lives in [place].  Mental status examination Patient is pleasant cooperative and maintained a good eye contact. She is well-groomed and well-dressed. She is appropriate to her stated age. She described her mood is anxious and her affect is mood congruent. She's alert and oriented x3. Her attention and concentration is okay. She denies any active or passive suicidal thinking and homicidal thinking. She denies any auditory or visual hallucination. Her thought process is logical linear and goal-directed. There are no paranoia or delusions present at this time. Her insight judgment and impulse control is okay.  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 At this time she is not reporting any side effects of medication. I have explained the risks and benefits of medication in detail I will see her again in 3 months.

## 2012-03-16 ENCOUNTER — Other Ambulatory Visit (HOSPITAL_COMMUNITY): Payer: Self-pay | Admitting: Psychiatry

## 2012-03-17 ENCOUNTER — Other Ambulatory Visit (HOSPITAL_COMMUNITY): Payer: Self-pay | Admitting: Psychiatry

## 2012-03-17 NOTE — Telephone Encounter (Signed)
Given on 02/26/12 with one more additional refill

## 2012-05-20 ENCOUNTER — Other Ambulatory Visit (HOSPITAL_COMMUNITY): Payer: Self-pay | Admitting: Psychiatry

## 2012-05-27 ENCOUNTER — Encounter (HOSPITAL_COMMUNITY): Payer: Self-pay | Admitting: Psychiatry

## 2012-05-27 ENCOUNTER — Ambulatory Visit (INDEPENDENT_AMBULATORY_CARE_PROVIDER_SITE_OTHER): Payer: 59 | Admitting: Psychiatry

## 2012-05-27 DIAGNOSIS — F419 Anxiety disorder, unspecified: Secondary | ICD-10-CM

## 2012-05-27 DIAGNOSIS — F411 Generalized anxiety disorder: Secondary | ICD-10-CM

## 2012-05-27 MED ORDER — PAXIL 40 MG PO TABS: ORAL_TABLET | ORAL | Status: DC

## 2012-05-27 NOTE — Progress Notes (Signed)
Chief complaint I'm doing better on the medication  History of present illness Patient is 61 year old married Caucasian female who came for her followup appointment. She has been compliant with her Paxil 60 mg. She reported no side effects of medication. She reported less anxious, less depressed and denies any severe panic attack. She sleeps 6-7 hours without any problem.  She does not require oxazepam every night which has been prescribed by her primary care physician.  She has been taking brand name Paxil which is helping her a lot. She denies any agitation anger or mood swings. She denies any crying spells. She denies any drinking or using any illegal drugs.  Current psychiatric medication Paxil 60 mg daily Oxazepam 10 mg only as needed prescribed by primary care physician Dr. Ollen Bowl.  Medical history Hyperlipidemia, hypertension and diabetes.  Psychosocial history Patient lives with her husband. She has been married for 42 years. She has 2 children lives in [place].  Mental status examination Patient is pleasant cooperative and maintained a good eye contact. She is well-groomed and well-dressed. She is appropriate to her stated age. She described her mood is anxious and her affect is mood congruent. She's alert and oriented x3. Her attention and concentration is okay. She denies any active or passive suicidal thinking and homicidal thinking. She denies any auditory or visual hallucination. Her thought process is logical linear and goal-directed. There are no paranoia or delusions present at this time. Her insight judgment and impulse control is okay.  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  She also takes oxazepam 10 mg very rarely for insomnia.  At this time she is not reporting any side effects of medication. I have explained the risks and benefits of  medication in detail I will see her again in 3 months.  Portion of this note is generated with voice recognition software and may contain typographical error.

## 2012-06-22 ENCOUNTER — Other Ambulatory Visit (HOSPITAL_COMMUNITY): Payer: Self-pay | Admitting: Psychiatry

## 2012-06-22 NOTE — Telephone Encounter (Signed)
Given on 05/27/12 with one more refill.

## 2012-07-09 LAB — HM DIABETES FOOT EXAM

## 2012-08-26 ENCOUNTER — Ambulatory Visit (INDEPENDENT_AMBULATORY_CARE_PROVIDER_SITE_OTHER): Payer: 59 | Admitting: Psychiatry

## 2012-08-26 ENCOUNTER — Encounter (HOSPITAL_COMMUNITY): Payer: Self-pay | Admitting: Psychiatry

## 2012-08-26 VITALS — Wt 141.0 lb

## 2012-08-26 DIAGNOSIS — F411 Generalized anxiety disorder: Secondary | ICD-10-CM

## 2012-08-26 DIAGNOSIS — F419 Anxiety disorder, unspecified: Secondary | ICD-10-CM

## 2012-08-26 MED ORDER — PAXIL 40 MG PO TABS: ORAL_TABLET | ORAL | Status: DC

## 2012-08-26 MED ORDER — ZOLPIDEM TARTRATE 10 MG PO TABS: ORAL_TABLET | ORAL | Status: DC

## 2012-08-26 NOTE — Progress Notes (Signed)
Chief complaint Medication management and followup.    History of present illness Patient is 61 year old married Caucasian female who came for her followup appointment. She has been compliant with her Paxil 60 mg. She reported no side effects of medication.  She complained of insomnia on occasions and wondering if she can take Ambien as needed.  She is not taking Serax on a regular basis.  Her idea symptoms are much improved.  She denies any recent panic attack agitation anger mood swing.  She likes her brand name Paxil.  She's not drinking or using any illegal substance.  She's been regular exercise and able to lose more weight from the past.  She is excited about her upcoming wedding anniversary.  Current psychiatric medication Paxil 60 mg daily Oxazepam 10 mg only as needed prescribed by primary care physician Dr. Ollen Bowl.  She has not used in past few months.  Medical history Hyperlipidemia, hypertension and diabetes.  Psychosocial history Patient lives with her husband. She has been married for 42 years. She has 2 children lives in [place].  Mental status examination Patient is pleasant cooperative and maintained a good eye contact. She is well-groomed and well-dressed. She is appropriate to her stated age. She described her mood is anxious and her affect is mood congruent. She's alert and oriented x3. Her attention and concentration is okay. She denies any active or passive suicidal thinking and homicidal thinking. She denies any auditory or visual hallucination. Her thought process is logical linear and goal-directed. There are no paranoia or delusions present at this time. Her insight judgment and impulse control is okay.  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  She has lost weight from the past.  She is very happy about it.  I will  continue brand name Paxil 60 mg daily.  I will add Ambien 10 mg half to one tablet as needed for when necessary insomnia.  I recommend not to take Serax with Ambien.  Patient is not taking Serax on a regular basis.  I explained risks and benefits of medication especially interaction with benzodiazepine.  I recommend to call us if she has any question or concern about the medication.  I will see her again in 3 months.  Portion of this note is generated with voice recognition software and may contain typographical error.

## 2012-11-25 ENCOUNTER — Encounter (HOSPITAL_COMMUNITY): Payer: Self-pay | Admitting: Psychiatry

## 2012-11-25 ENCOUNTER — Ambulatory Visit (INDEPENDENT_AMBULATORY_CARE_PROVIDER_SITE_OTHER): Payer: 59 | Admitting: Psychiatry

## 2012-11-25 VITALS — BP 144/72 | HR 72 | Ht 63.0 in | Wt 139.4 lb

## 2012-11-25 DIAGNOSIS — F419 Anxiety disorder, unspecified: Secondary | ICD-10-CM

## 2012-11-25 DIAGNOSIS — F411 Generalized anxiety disorder: Secondary | ICD-10-CM

## 2012-11-25 DIAGNOSIS — F3289 Other specified depressive episodes: Secondary | ICD-10-CM

## 2012-11-25 DIAGNOSIS — F329 Major depressive disorder, single episode, unspecified: Secondary | ICD-10-CM

## 2012-11-25 MED ORDER — PAXIL 40 MG PO TABS: ORAL_TABLET | ORAL | Status: DC

## 2012-11-25 NOTE — Patient Instructions (Addendum)
[name] [name] is an Hydrologist who has worked with diet and medicine for 15 years.  Her phone number is [phone].  She is an excellent resource for managing diet and health.   [name] [name] is also an excellent resource about L Phenylalanine.   Cut back on sugar and carbohydrates, that means very limited fruits and starchy vegetables and very limited grains, breads  The goal is low GLYCEMIC INDEX.  Cut back on all wheat, rye, or barley  Eat avocados, eggs, lean meat like grass fed beef and chicken  Stevia is an very reasonable sweetener that helps the brain work and helps with weight management.  Have happy holidays!

## 2012-11-25 NOTE — Progress Notes (Signed)
Chief complaint Chief Complaint  Patient presents with  . Anxiety  . Follow-up  . Establish Care  . Medication Refill   Subjective: "I'm doing pretty good".  History of present illness Patient is 61 year old married Caucasian female who came for her followup appointment. Pt reports comp;inace with psychotropic medications enjoying good benefit with decreased libido noted with the Paxil.  She was informed that Periactin could be prescribed for that, but she was reluctant to try it just yet.  Discussed her using L Phenylalanine to help suppress her appetite.  Also gave her the name of Tilden Fossa to help consult on diet and food choices for her IBS, brain and diabetes.   Current psychiatric medication Paxil 60 mg daily  Medical history Hyperlipidemia, hypertension and diabetes.  Psychosocial history Patient lives with her husband. She has been married for 42 years. She has 2 children lives in [place].  Mental status examination Patient is pleasant cooperative and maintained a good eye contact. She is well-groomed and well-dressed. She is appropriate to her stated age. She described her mood is anxious and her affect is mood congruent. She's alert and oriented x3. Her attention and concentration is okay. She denies any active or passive suicidal thinking and homicidal thinking. She denies any auditory or visual hallucination. Her thought process is logical linear and goal-directed. There are no paranoia or delusions present at this time. Her insight judgment and impulse control is okay.  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  I reviewed CC, tobacco/med/surg Hx, meds effects/ side effects, problem list, therapies and responses as well as current situation/symptoms discussed options. See orders and pt instructions for more details.

## 2013-01-05 LAB — VITAMIN B12: Vitamin B-12: 504

## 2013-02-11 ENCOUNTER — Telehealth (HOSPITAL_COMMUNITY): Payer: Self-pay | Admitting: Psychiatry

## 2013-02-11 DIAGNOSIS — F419 Anxiety disorder, unspecified: Secondary | ICD-10-CM

## 2013-02-11 MED ORDER — PAXIL 40 MG PO TABS: ORAL_TABLET | ORAL | Status: DC

## 2013-02-11 NOTE — Telephone Encounter (Signed)
Request satisfied via eScripts  

## 2013-02-24 ENCOUNTER — Ambulatory Visit (INDEPENDENT_AMBULATORY_CARE_PROVIDER_SITE_OTHER): Payer: 59 | Admitting: Psychiatry

## 2013-02-24 ENCOUNTER — Encounter (HOSPITAL_COMMUNITY): Payer: Self-pay | Admitting: Psychiatry

## 2013-02-24 VITALS — Wt 138.6 lb

## 2013-02-24 DIAGNOSIS — F329 Major depressive disorder, single episode, unspecified: Secondary | ICD-10-CM

## 2013-02-24 DIAGNOSIS — F411 Generalized anxiety disorder: Secondary | ICD-10-CM

## 2013-02-24 DIAGNOSIS — F3289 Other specified depressive episodes: Secondary | ICD-10-CM

## 2013-02-24 DIAGNOSIS — F419 Anxiety disorder, unspecified: Secondary | ICD-10-CM

## 2013-02-24 MED ORDER — PAXIL 40 MG PO TABS: ORAL_TABLET | ORAL | Status: DC

## 2013-02-24 NOTE — Patient Instructions (Addendum)
CUT BACK/CUT OUT on sugar and carbohydrates, that means very limited fruits and starchy vegetables and very limited grains, breads  The goal is low GLYCEMIC INDEX.  CUT OUT all wheat, rye, or barley for the GLUTEN in them.  HIGH fat and LOW carbohydrate diet is the KEY.  Eat avocados, eggs, lean meat like grass fed beef and chicken  Nuts and seeds would be good foods as well.   Stevia is an excellent sweetener.  Safe for the brain.   Lowella Grip is also a good safe sweetener.  Almond butter is awesome.  Check out all this on the Internet.  [email or web address].com/ Has some good info on the High fat carb diet.

## 2013-02-24 NOTE — Progress Notes (Signed)
[facility] [address] Progress Note [name] [name] MRN: [id number] DOB: September 22, 1951 Age: 62 y.o.  Date: 02/24/2013 Start Time: 10:20 AM End Time: 10:40 AM  Chief Complaint: Chief Complaint  Patient presents with  . Anxiety  . Follow-up  . Medication Refill   Subjective: "I'm doing pretty good".  History of present illness Patient is 62 year old married Caucasian female who came for her followup appointment. Pt reports comp;inace with psychotropic medications enjoying good benefit with decreased libido noted with the Paxil.  She was informed that Periactin could be prescribed for that, but she was reluctant to try it just yet.  Discussed the benefit for her cholesterol adn blood sugar with the High fat low carb diet.  Gave her info on that.  Current psychiatric medication Paxil 60 mg daily Serax 10 mg once every 2 to 3 months  Family History family history includes ADD / ADHD in her brother; Alcohol abuse in her brothers; Anxiety disorder in her maternal aunt, maternal grandmother, and mother; Bipolar disorder in her other; and Drug abuse in her brother.  There is no history of Dementia, and OCD, and Paranoid behavior, and Physical abuse, and Schizophrenia, and Seizures, and Sexual abuse, .  Medical history Hyperlipidemia, hypertension and diabetes.  Psychosocial history Patient lives with her husband. She has been married for 42 years. She has 2 children lives in [place].  Mental status examination Patient is pleasant cooperative and maintained a good eye contact. She is well-groomed and well-dressed. She is appropriate to her stated age. She described her mood is anxious and her affect is mood congruent. She's alert and oriented x3. Her attention and concentration is okay. She denies any active or passive suicidal thinking and homicidal thinking. She denies any auditory or visual hallucination. Her thought process is logical linear and goal-directed. There are no  paranoia or delusions present at this time. Her insight judgment and impulse control is okay.  Lab Results: No results found for this or any previous visit (from the past 8736 hour(s)). PCP draws routine labs and cholesterol and blood sugar are the primary concerns.   Assessment  Axis I Anxiety disorder Axis II deferred Axis III see medical history Axis IV mild to moderate  Plan/Discussion: I took her vitals.  I reviewed CC, tobacco/med/surg Hx, meds effects/ side effects, problem list, therapies and responses as well as current situation/symptoms discussed options. Continue Paxil and occasional Serax See orders and pt instructions for more details.  Medical Decision Making Problem Points:  Established problem, stable/improving (1), Review of last therapy session (1) and Review of psycho-social stressors (1) Data Points:  Review or order clinical lab tests (1) Review of medication regiment & side effects (2)  I certify that outpatient services furnished can reasonably be expected to improve the patient's condition.   Orson Aloe, MD, [facility]

## 2013-02-25 ENCOUNTER — Other Ambulatory Visit: Payer: Self-pay | Admitting: Adult Health

## 2013-02-25 DIAGNOSIS — Z139 Encounter for screening, unspecified: Secondary | ICD-10-CM

## 2013-03-02 ENCOUNTER — Ambulatory Visit (HOSPITAL_COMMUNITY): Payer: Self-pay

## 2013-03-20 ENCOUNTER — Encounter: Payer: Self-pay | Admitting: *Deleted

## 2013-03-23 ENCOUNTER — Encounter: Payer: Self-pay | Admitting: Adult Health

## 2013-03-23 ENCOUNTER — Ambulatory Visit (INDEPENDENT_AMBULATORY_CARE_PROVIDER_SITE_OTHER): Payer: 59 | Admitting: Adult Health

## 2013-03-23 VITALS — BP 112/60 | HR 74 | Ht 63.0 in | Wt 140.0 lb

## 2013-03-23 DIAGNOSIS — K5900 Constipation, unspecified: Secondary | ICD-10-CM

## 2013-03-23 DIAGNOSIS — Z0000 Encounter for general adult medical examination without abnormal findings: Secondary | ICD-10-CM

## 2013-03-23 DIAGNOSIS — Z1212 Encounter for screening for malignant neoplasm of rectum: Secondary | ICD-10-CM

## 2013-03-23 DIAGNOSIS — Z01419 Encounter for gynecological examination (general) (routine) without abnormal findings: Secondary | ICD-10-CM

## 2013-03-23 DIAGNOSIS — K5901 Slow transit constipation: Secondary | ICD-10-CM

## 2013-03-23 DIAGNOSIS — F411 Generalized anxiety disorder: Secondary | ICD-10-CM

## 2013-03-23 DIAGNOSIS — N816 Rectocele: Secondary | ICD-10-CM

## 2013-03-23 LAB — HEMOCCULT GUIAC POC 1CARD (OFFICE): Fecal Occult Blood, POC: NEGATIVE

## 2013-03-23 NOTE — Patient Instructions (Addendum)
Follow in 1 year for physical  Increase fiber and water Discussed rectocele and how it affects BMs  Can sign up for my chart when gets computer

## 2013-03-23 NOTE — Progress Notes (Addendum)
Patient ID: RAJAH LAMBA, female   DOB: [birth year], 62 y.o.   MRN: [id number] History of Present Illness:[name] [name] is a 62 year old white female in for her physical. She still has issues with constipation.But has recently started using OTC herbal supplement.    Current Medications, Allergies, Past Medical History, Past Surgical History, Family History and Social History were reviewed in [employer] record.     Review of Systems:Patient denies any headaches, blurred vision, shortness of breath, chest pain, abdominal pain, problems with urination,  Sex is not often. She is having constipation issues. No joint pain or mood changes. Saw Dr. Juanetta Gosling recently and has labs there(A1c 7).     Physical Exam: General:  Well developed, well nourished, no acute distress Skin:  Warm and dry Neck:  Midline trachea, normal thyroid Lungs; Clear to auscultation bilaterally Breast:  No dominant palpable mass, retraction, or nipple discharge Cardiovascular: Regular rate and rhythm Abdomen:  Soft, non tender, no hepatosplenomegaly Pelvic:  External genitalia is normal in appearance for age.  The vagina is normal in appearance for her age with loss of rugae, Cervix and uterus are absent. No adnexal masses or tenderness noted. Rectal: Good sphincter tone, no polyps, or hemorrhoids felt.She has a moderate rectocele.( I told her how to use 2 fingers in the vagina to push back to ease the passage of stool, not interested in surgery yet).  Hemoccult negative. Extremities:  No swelling or varicosities noted Psych:  No mood changes   Impression:Yearly exam Constipation Rectocele History of diabetes, elevated cholesterol and anxiety.     Plan:Follow 1 year for physical exam, get mammogram yearly and colonoscopy in 2018. Increase fiber and water Call prn problems and when ready to quit using snuff

## 2013-04-28 ENCOUNTER — Ambulatory Visit (HOSPITAL_COMMUNITY): Payer: Self-pay

## 2013-05-20 ENCOUNTER — Ambulatory Visit (HOSPITAL_COMMUNITY)
Admission: RE | Admit: 2013-05-20 | Discharge: 2013-05-20 | Disposition: A | Discharge status: Home / Self Care | Payer: 59 | Source: Ambulatory Visit | Attending: Cardiovascular Disease | Admitting: Cardiovascular Disease

## 2013-05-20 DIAGNOSIS — R011 Cardiac murmur, unspecified: Secondary | ICD-10-CM | POA: Insufficient documentation

## 2013-05-20 DIAGNOSIS — E119 Type 2 diabetes mellitus without complications: Secondary | ICD-10-CM | POA: Insufficient documentation

## 2013-05-20 DIAGNOSIS — E785 Hyperlipidemia, unspecified: Secondary | ICD-10-CM | POA: Insufficient documentation

## 2013-05-20 DIAGNOSIS — I359 Nonrheumatic aortic valve disorder, unspecified: Secondary | ICD-10-CM

## 2013-05-20 NOTE — Progress Notes (Signed)
*  PRELIMINARY RESULTS* Echocardiogram 2D Echocardiogram has been performed.  [name] [name] Y 05/20/2013, 9:22 AM

## 2013-06-23 ENCOUNTER — Ambulatory Visit (HOSPITAL_COMMUNITY): Payer: Self-pay | Admitting: Psychiatry

## 2013-06-25 ENCOUNTER — Ambulatory Visit (HOSPITAL_COMMUNITY): Payer: Self-pay | Admitting: Psychiatry

## 2013-06-25 ENCOUNTER — Ambulatory Visit (INDEPENDENT_AMBULATORY_CARE_PROVIDER_SITE_OTHER): Payer: 59 | Admitting: Psychiatry

## 2013-06-25 ENCOUNTER — Encounter (HOSPITAL_COMMUNITY): Payer: Self-pay | Admitting: Psychiatry

## 2013-06-25 VITALS — Wt 138.0 lb

## 2013-06-25 DIAGNOSIS — F419 Anxiety disorder, unspecified: Secondary | ICD-10-CM

## 2013-06-25 DIAGNOSIS — F411 Generalized anxiety disorder: Secondary | ICD-10-CM

## 2013-06-25 MED ORDER — PAXIL 40 MG PO TABS: ORAL_TABLET | ORAL | Status: DC

## 2013-06-25 NOTE — Progress Notes (Signed)
[facility] [address] Progress Note [name] [name] MRN: [id number] DOB: Apr 28, 1951 Age: 62 y.o.  Date: 06/25/2013  Chief Complaint: Chief Complaint  Patient presents with  . Follow-up  . Medication Refill   History of present illness Patient is 62 year old married Caucasian female who came for her followup appointment. Pt reports comp;inace with psychotropic medications and denies any side effects.  She sleeping better.  She denies any panic attack or any nervousness.  Her anxiety is much better with the Paxil.  She is no longer taking Serax and her primary care physician switched to bentyl for her stomach issues.  She likes the new medication.  She is trying to maintain her weight.  Patient denies any agitation anger or mood swing.  She wants to continue her current psychiatric medication.  Current psychiatric medication Paxil 60 mg daily  Family History family history includes ADD / ADHD in her brother; Alcohol abuse in her brothers; Anxiety disorder in her maternal aunt, maternal grandmother, and mother; Bipolar disorder in her other; and Drug abuse in her brother.  There is no history of Dementia, and OCD, and Paranoid behavior, and Physical abuse, and Schizophrenia, and Seizures, and Sexual abuse, .  Medical history Hyperlipidemia, hypertension and diabetes.  Psychosocial history Patient lives with her husband. She has been married for 42 years. She has 2 children lives in [place].  Mental status examination Patient is pleasant cooperative and maintained a good eye contact. She is well-groomed and well-dressed. She is appropriate to her stated age. She described her mood is anxious and her affect is mood congruent. She's alert and oriented x3. Her attention and concentration is okay. She denies any active or passive suicidal thinking and homicidal thinking. She denies any auditory or visual hallucination. Her thought process is logical linear and goal-directed. There  are no paranoia or delusions present at this time. Her insight judgment and impulse control is okay.  Lab Results:  Results for orders placed in visit on 03/23/13 (from the past 8736 hour(s))  HEMOCCULT GUIAC POC 1CARD (OFFICE)   Collection Time    03/23/13 10:52 AM      Result Value Range   Fecal Occult Blood, POC Negative     Card #1 Date       Card #2 Fecal Occult Blod, POC       Card #2 Date       Card #3 Fecal Occult Blood, POC       Card #3 Date       PCP draws routine labs and cholesterol and blood sugar are the primary concerns.   Assessment  Axis I Anxiety disorder Axis II deferred Axis III see medical history Axis IV mild to moderate  Plan/Discussion: Patient is stable at Paxil 60 mg.  Recommend to continue.  Risk and benefits of the medication explain.  Recommend to call us back if she is any question or concern if she feels worsening of the symptom.  Followup in 5 months.  Medical Decision Making Problem Points:  Established problem, stable/improving (1), Review of last therapy session (1) and Review of psycho-social stressors (1) Data Points:  Review or order clinical lab tests (1) Review of medication regiment & side effects (2)  I certify that outpatient services furnished can reasonably be expected to improve the patient's condition.   Audrina Marten T., MD

## 2013-06-28 ENCOUNTER — Encounter: Payer: Self-pay | Admitting: *Deleted

## 2013-06-29 ENCOUNTER — Encounter: Payer: Self-pay | Admitting: Cardiovascular Disease

## 2013-06-30 ENCOUNTER — Ambulatory Visit (INDEPENDENT_AMBULATORY_CARE_PROVIDER_SITE_OTHER): Payer: 59 | Admitting: Cardiovascular Disease

## 2013-06-30 ENCOUNTER — Encounter: Payer: Self-pay | Admitting: Cardiovascular Disease

## 2013-06-30 VITALS — BP 110/62 | Ht 63.0 in | Wt 139.4 lb

## 2013-06-30 DIAGNOSIS — R002 Palpitations: Secondary | ICD-10-CM

## 2013-06-30 DIAGNOSIS — Z79899 Other long term (current) drug therapy: Secondary | ICD-10-CM

## 2013-06-30 DIAGNOSIS — I359 Nonrheumatic aortic valve disorder, unspecified: Secondary | ICD-10-CM

## 2013-06-30 DIAGNOSIS — E785 Hyperlipidemia, unspecified: Secondary | ICD-10-CM

## 2013-06-30 DIAGNOSIS — I351 Nonrheumatic aortic (valve) insufficiency: Secondary | ICD-10-CM

## 2013-06-30 MED ORDER — PITAVASTATIN CALCIUM 1 MG PO TABS: 1.0000 mg | ORAL_TABLET | Freq: Every day | ORAL | Status: DC

## 2013-06-30 NOTE — Progress Notes (Signed)
Patient ID: [name] [name], female   DOB: 07/21/1951, 62 y.o.   MRN: [id number]     HPI: [name] [name], is a 62 y.o. female who presents to the office today for a six-month cardiology evaluation. Ms. [name] [name] has a history of tachycardia palpitations which has been controlled on beta blocker therapy. Remotely, she has a history of hepatosplenomegaly. She has documented mild sclerosis and mild to moderate aortic insufficiency or aortic valve. Her last echo Doppler study was done in 2012. [name] [name] laboratory was last done by Dr. Juanetta Gosling in January 2014 which showed a glucose of 164. Total cholesterol 249 triglycerides 155 LDL cholesterol 157. She was on Z. he will call when that was done this in the past she had myalgias secondary to Lipitor, Crestor, and Zocor. Remotely, she did have mild LFT elevation in the setting of significant hyperglycemia and hepatosteatorrhea. She states on her current dose of beta blocker her palpitations have been well controlled. She denies chest pressure. She denies shortness of breath. She did have a two-year followup echo Dopplers which was done in 05/20/2013. This showed an ejection fraction of 60-65%. She had grade 1 diastolic dysfunction. Aortic valve mildly calcified annulus. She had mild-to-moderate regurgitation. Was mild aortic sclerosis. There is mild mitral regurgitation.  Past Medical History  Diagnosis Date  . Anxiety   . Diabetes mellitus type II   . Depression   . MVP (mitral valve prolapse)   . Rectocele   . Constipation 03/23/2013  . Hyperlipemia 02/21/09    NUC STRESS TEST-NORMAL- EF 74%  . Elevated cholesterol     Past Surgical History  Procedure Laterality Date  . Abdominal hysterectomy    . Carpel tunnel Bilateral 12/17/2009  . Tubal ligation    . Bilateral salpingoophorectomy      Allergies  Allergen Reactions  . Codeine Other (See Comments)    Took it years ago and doesn't remember the reaction    Current Outpatient  Prescriptions  Medication Sig Dispense Refill  . atenolol (TENORMIN) 25 MG tablet Take 50 mg by mouth 2 (two) times daily.       . Biotin 1 MG CAPS Take by mouth.      . cholecalciferol (VITAMIN D) 1000 UNITS tablet Take 1,000 Units by mouth daily.      Marland Kitchen CINNAMON PO Take 1,000 mg by mouth daily.      . clotrimazole-betamethasone (LOTRISONE) cream       . co-enzyme Q-10 30 MG capsule Take 100 mg by mouth daily. Takes 200mg       . desoximetasone (TOPICORT) 0.25 % cream       . dicyclomine (BENTYL) 20 MG tablet 20 mg every 6 (six) hours.       Boris Lown Oil 300 MG CAPS Take by mouth daily.      . metFORMIN (GLUCOPHAGE) 500 MG tablet       . PAXIL 40 MG tablet Take 1 and 1/2 tab daily  45 tablet  4  . penicillin v potassium (VEETID) 250 MG tablet       . UNABLE TO FIND Take 1,000 mg by mouth daily. triphala (herb)      . ZETIA 10 MG tablet Take 10 mg by mouth daily.       . Pitavastatin Calcium (LIVALO) 1 MG TABS Take 1 tablet (1 mg total) by mouth daily. Take 1 mg daily for 2 weeks then increase to 2 mg daily  30 tablet     No current  facility-administered medications for this visit.    Socially she is married 43 years. She has 2 children. She completed ninth grade education. There is no alcohol use. There is a history of snuff for tobacco use.  ROS is negative for fevers, chills or night sweats.  She denies any recent palpitations. She denies presyncope or syncope. She denies myalgias or arthralgias presently. She denies bleeding. She denies shortness of breath. She does not routinely exercise. There is a history of depression. Other system review is negative.  PE BP 110/62  Ht 5\' 3"  (1.6 m)  Wt 139 lb 6.4 oz (63.231 kg)  BMI 24.7 kg/m2  General: Alert, oriented, no distress.  Skin: normal turgor, no rashes HEENT: Normocephalic, atraumatic. Pupils round and reactive; sclera anicteric;no lid lag.  Nose without nasal septal hypertrophy Mouth/Parynx benign; Mallinpatti scale 2/3 Neck: No  JVD, no carotid briuts Lungs: clear to ausculatation and percussion; no wheezing or rales Heart: RRR, s1 s2 normal 1-2/6 systolic murmur in the aortic region and a 1/6 diastolic murmur compatible with her aortic sclerosis and aortic insufficiency. Abdomen: soft, nontender; no hepatosplenomehaly, BS+; abdominal aorta nontender and not dilated by palpation. Pulses 2+ Extremities: no clubbing cyanosis or edema, Homan's sign negative  Neurologic: grossly nonfocal  ECG: Sinus rhythm at 66 beats per minute. They're nondiagnostic T changes precordially.  LABS:  BMET    Component Value Date/Time   NA 136 01/09/2008 1239   K 4.3 01/09/2008 1239   CL 103 01/09/2008 1239   CO2 26 01/09/2008 1239   GLUCOSE 263* 01/09/2008 1239   BUN 11 01/09/2008 1239   CREATININE 0.66 01/09/2008 1239   CALCIUM 9.6 01/09/2008 1239   GFRNONAA >60 01/09/2008 1239   GFRAA  Value: >60        The eGFR has been calculated using the MDRD equation. This calculation has not been validated in all clinical 01/09/2008 1239     Hepatic Function Panel     Component Value Date/Time   PROT 8.0 01/09/2008 1239   ALBUMIN 4.0 01/09/2008 1239   AST 82* 01/09/2008 1239   ALT 74* 01/09/2008 1239   ALKPHOS 67 01/09/2008 1239   BILITOT 0.5 01/09/2008 1239   BILIDIR 0.1 10/22/2007 1112     CBC    Component Value Date/Time   WBC 4.8 03/15/2008 0916   RBC 4.37 03/15/2008 0916   HGB 12.5 03/15/2008 0916   HCT 35.8* 03/15/2008 0916   PLT 160 03/15/2008 0916   MCV 81.9 03/15/2008 0916   MCHC 34.9 03/15/2008 0916   RDW 15.1 03/15/2008 0916   MONOABS 0.6 11/17/2007 1451   EOSABS 0.0 11/17/2007 1451   BASOSABS 0.0 11/17/2007 1451     BNP No results found for this basename: probnp    Lipid Panel  No results found for this basename: chol, trig, hdl, cholhdl, vldl, ldlcalc     RADIOLOGY: No results found.    ASSESSMENT AND PLAN: [name] [name] palpitations have improved and she seems to be tolerating her current dose of atenolol 50 mg  twice a day. Her blood pressure is well-controlled on repeat by me was 130/78. A long discussion with her concerning her lipid status. In the past she did not tolerate simvastatin atorvastatin and Crestor. I discussed with her the opportunity of perhaps trying Livalo and provided her with samples. I recommended she discontinue her WelChol but continued Zetia. She will start Livalo at 1 mg for the frst 2 weeks and if she's able to tolerate this this  will then be titrated to 2 mg. In 4-6 weeks we will repeat CMP and do an NMR lipoprofile. I will see her in 2 months for followup evaluation.     Lennette Bihari, MD, [facility]  06/30/2013 12:19 PM

## 2013-06-30 NOTE — Patient Instructions (Addendum)
Your physician recommends that you schedule a follow-up appointment in: 2 month  Your physician has recommended you make the following change in your medication: STOP WELCHOL AND START LIVALO 1 MG FOR 2 WEEKS THEN 2 MG DAILY  Your physician recommends that you return for lab work in: 6 WEEKS CMP AND NMR LIPIDS

## 2013-07-14 ENCOUNTER — Telehealth: Payer: Self-pay | Admitting: Cardiovascular Disease

## 2013-07-14 NOTE — Telephone Encounter (Signed)
Please call-wants to give you an update on how she is doing with her cholesterol medicine.

## 2013-07-14 NOTE — Telephone Encounter (Signed)
Returned call.  Pt stated Dr. Tresa Endo put her on a cholesterol pill and told her to call back and let him know how it's doing.  Pt stated she isn't going to be able to take it b/c it's keeping her Blood Sugar up.  Stated he told her to stop taking WelChol and she wants to know if Dr. Tresa Endo wants her to go back on it or not.  Stated blood sugars have been 193, 212,197, 311, 245 since Thursday of last week.  Pt informed Dr. Tresa Endo will be notified for further instructions.  Pt verbalized understanding and agreed w/ plan.  Message forwarded to Dr. Pierre Bali, CMA.

## 2013-07-15 NOTE — Telephone Encounter (Signed)
Livalo should not increase her glucose. Continue treatment if possible.

## 2013-07-17 NOTE — Telephone Encounter (Signed)
Informed patient per Dr. Tresa Endo to continue the Livalo if possible. He doesn't feel this caused the increase i her glucose level. The patient informed me that she has stopped the livalo. She does feel that this is the reason and does not want her sugar levels to get out of control.

## 2013-07-28 ENCOUNTER — Other Ambulatory Visit: Payer: Self-pay

## 2013-07-28 MED ORDER — ATENOLOL 25 MG PO TABS: 50.0000 mg | ORAL_TABLET | Freq: Two times a day (BID) | ORAL | Status: DC

## 2013-07-28 NOTE — Telephone Encounter (Signed)
Rx was sent to pharmacy electronically. 

## 2013-07-29 ENCOUNTER — Other Ambulatory Visit: Payer: Self-pay | Admitting: *Deleted

## 2013-08-18 ENCOUNTER — Ambulatory Visit (HOSPITAL_COMMUNITY)
Admission: RE | Admit: 2013-08-18 | Discharge: 2013-08-18 | Disposition: A | Discharge status: Home / Self Care | Payer: 59 | Source: Ambulatory Visit | Attending: Adult Health | Admitting: Adult Health

## 2013-08-18 DIAGNOSIS — Z1231 Encounter for screening mammogram for malignant neoplasm of breast: Secondary | ICD-10-CM | POA: Insufficient documentation

## 2013-08-18 DIAGNOSIS — Z139 Encounter for screening, unspecified: Secondary | ICD-10-CM

## 2013-09-01 ENCOUNTER — Ambulatory Visit: Payer: Self-pay | Admitting: Cardiovascular Disease

## 2013-11-26 ENCOUNTER — Ambulatory Visit (HOSPITAL_COMMUNITY): Payer: Self-pay | Admitting: Psychiatry

## 2013-11-26 ENCOUNTER — Encounter (HOSPITAL_COMMUNITY): Payer: Self-pay | Admitting: Psychiatry

## 2013-11-26 ENCOUNTER — Ambulatory Visit (INDEPENDENT_AMBULATORY_CARE_PROVIDER_SITE_OTHER): Payer: 59 | Admitting: Psychiatry

## 2013-11-26 VITALS — BP 110/60

## 2013-11-26 DIAGNOSIS — F419 Anxiety disorder, unspecified: Secondary | ICD-10-CM

## 2013-11-26 DIAGNOSIS — F411 Generalized anxiety disorder: Secondary | ICD-10-CM

## 2013-11-26 MED ORDER — PAXIL 40 MG PO TABS: ORAL_TABLET | ORAL | Status: DC

## 2013-11-26 NOTE — Progress Notes (Signed)
Patient ID: [name] [name], female   DOB: [birth year], 62 y.o.   MRN: [id number] [facility] [address] Progress Note [name] [name] MRN: [id number] DOB: 1951/02/20 Age: 62 y.o.  Date: 11/26/2013  Chief Complaint: Chief Complaint  Patient presents with  . Anxiety  . Depression  . Follow-up   History of present illness Patient is 62 year old married Caucasian female lives with her husband in [place]. 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 She has 2 children and 3 grandchildren.  The patient states she's had anxiety issues for at least 20 years. She has mitral valve prolapse and she claims it "tore up my nerves." She's tried numerous medicines at Paxil as worked the best for her. Currently her anxiety is well controlled her mood is good. She doesn't sleep much at night only gets 3-4 hours of rest. This has been going on for about 5 years and she's not sure why she. She claims she's adjusted to it.  Current psychiatric medication Paxil 60 mg daily  Family History family history includes ADD / ADHD in her brother; Alcohol abuse in her brother, brother, and brother; Anxiety disorder in her maternal aunt, maternal grandmother, and mother; Bipolar disorder in her other; Drug abuse in her brother. There is no history of Dementia, OCD, Paranoid behavior, Physical abuse, Schizophrenia, Seizures, or Sexual abuse.  Medical history Hyperlipidemia, hypertension and diabetes.  Psychosocial history Patient lives with her husband. She has been married for 42 years. She has 2 children lives in [place].  Mental status examination Patient is pleasant cooperative and maintained a good eye contact. She is well-groomed and well-dressed. She is appropriate to her stated age. She described her mood is anxious and her affect is mood congruent. She's alert and oriented x3. Her attention and concentration is okay. She denies any active or passive suicidal thinking and homicidal thinking.  She denies any auditory or visual hallucination. Her thought process is logical linear and goal-directed. There are no paranoia or delusions present at this time. Her insight judgment and impulse control is okay.  Lab Results:  Results for orders placed in visit on 03/23/13 (from the past 8736 hour(s))  HEMOCCULT GUIAC POC 1CARD (OFFICE)   Collection Time    03/23/13 10:52 AM      Result Value Range   Fecal Occult Blood, POC Negative     Card #1 Date       Card #2 Fecal Occult Blod, POC       Card #2 Date       Card #3 Fecal Occult Blood, POC       Card #3 Date       PCP draws routine labs and cholesterol and blood sugar are the primary concerns.   Assessment  Axis I Anxiety disorder Axis II deferred Axis III see medical history Axis IV mild to moderate  Plan/Discussion: Patient is stable at Paxil 60 mg.  Recommend to continue.  Risk and benefits of the medication explain.  Recommend to call us back if she is any question or concern if she feels worsening of the symptom.  Followup in 4 months.  Medical Decision Making Problem Points:  Established problem, stable/improving (1), Review of last therapy session (1) and Review of psycho-social stressors (1) Data Points:  Review or order clinical lab tests (1) Review of medication regiment & side effects (2)  I certify that outpatient services furnished can reasonably be expected to improve the patient's condition.   Diannia Ruder, MD

## 2014-03-22 ENCOUNTER — Telehealth (HOSPITAL_COMMUNITY): Payer: Self-pay | Admitting: *Deleted

## 2014-03-22 NOTE — Telephone Encounter (Signed)
noted 

## 2014-03-24 ENCOUNTER — Encounter (HOSPITAL_COMMUNITY): Payer: Self-pay | Admitting: Psychiatry

## 2014-03-24 ENCOUNTER — Ambulatory Visit (INDEPENDENT_AMBULATORY_CARE_PROVIDER_SITE_OTHER): Payer: 59 | Admitting: Psychiatry

## 2014-03-24 VITALS — BP 120/60 | Ht 63.0 in | Wt 134.0 lb

## 2014-03-24 DIAGNOSIS — F411 Generalized anxiety disorder: Secondary | ICD-10-CM

## 2014-03-24 DIAGNOSIS — F419 Anxiety disorder, unspecified: Secondary | ICD-10-CM

## 2014-03-24 MED ORDER — PAXIL 40 MG PO TABS: ORAL_TABLET | ORAL | Status: DC

## 2014-03-24 NOTE — Progress Notes (Signed)
Patient ID: CORDIA MIKLOS, female   DOB: 1951/02/10, 63 y.o.   MRN: [id number] Patient ID: ANGELO CAROLL, female   DOB: 1951-07-07, 63 y.o.   MRN: [id number] [place] [address] Progress Note TANIYAH BALLOW MRN: [id number] DOB: Jan 31, 1951 Age: 63 y.o.  Date: 03/24/2014  Chief Complaint: Chief Complaint  Patient presents with  . Anxiety  . Depression  . Follow-up   History of present illness Patient is 63 year old married Caucasian female lives with her husband in [place]. 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 She has 2 children and 3 grandchildren.  The patient states she's had anxiety issues for at least 20 years. She has mitral valve prolapse and she claims it "tore up my nerves." She's tried numerous medicines at Paxil as worked the best for her. Currently her anxiety is well controlled her mood is good. She doesn't sleep much at night only gets 3-4 hours of rest. This has been going on for about 5 years and she's not sure why she. She claims she's adjusted to it.  The patient returns after 3 months. She continues to do fairly well. Her mood is stable. She has diabetes and hasn't really checked her blood sugar in quite some time. I urged her to see her primary doctor and have this checked and she agrees. She still doesn't sleep all that well but claims she is used to it  Current psychiatric medication Paxil 60 mg daily  Family History family history includes ADD / ADHD in her brother; Alcohol abuse in her brother, brother, and brother; Anxiety disorder in her maternal aunt, maternal grandmother, and mother; Bipolar disorder in her other; Drug abuse in her brother. There is no history of Dementia, OCD, Paranoid behavior, Physical abuse, Schizophrenia, Seizures, or Sexual abuse.  Medical history Hyperlipidemia, hypertension and diabetes.  Psychosocial history Patient lives with her husband. She has been married for 42 years. She has 2 children lives in [place].  Mental status examination Patient is pleasant cooperative and maintained a good eye contact. She is well-groomed and well-dressed. She is appropriate to her stated age. She described her mood as mildly anxious and her affect is mood congruent. She's alert and oriented x3. Her attention and concentration is okay. She denies any active or passive suicidal thinking and homicidal thinking. She denies any auditory or visual hallucination. Her thought process is logical linear and goal-directed. There are no paranoia or delusions present at this time. Her insight judgment and impulse control is okay.  Lab Results:  No results found for this or any previous visit (from the past 8736 hour(s)). PCP draws routine labs and cholesterol and blood sugar are the primary concerns.   Assessment  Axis I Anxiety disorder Axis II deferred Axis III see medical history Axis IV mild to moderate  Plan/Discussion: Patient is stable at Paxil 60 mg.  Recommend to continue.  Risk and benefits of the medication explain.  Recommend to call us back if she is any question or concern if she feels worsening of the symptom.  Followup in 4 months.  Medical Decision Making Problem Points:  Established problem, stable/improving (1), Review of last therapy session (1) and Review of psycho-social stressors (1) Data Points:  Review or order clinical lab tests (1) Review of medication regiment & side effects (2)  I certify that outpatient services furnished can reasonably be expected to improve the patient's condition.   Levonne Spiller, MD

## 2014-05-16 ENCOUNTER — Other Ambulatory Visit (HOSPITAL_COMMUNITY): Payer: Self-pay | Admitting: Psychiatry

## 2014-05-19 ENCOUNTER — Other Ambulatory Visit (HOSPITAL_COMMUNITY): Payer: Self-pay | Admitting: Psychiatry

## 2014-05-19 ENCOUNTER — Telehealth (HOSPITAL_COMMUNITY): Payer: Self-pay | Admitting: *Deleted

## 2014-05-19 DIAGNOSIS — F419 Anxiety disorder, unspecified: Secondary | ICD-10-CM

## 2014-05-19 MED ORDER — PAXIL 40 MG PO TABS: ORAL_TABLET | ORAL | Status: DC

## 2014-05-19 NOTE — Telephone Encounter (Signed)
done

## 2014-07-06 ENCOUNTER — Other Ambulatory Visit: Payer: Self-pay | Admitting: *Deleted

## 2014-07-06 MED ORDER — ATENOLOL 25 MG PO TABS: 50.0000 mg | ORAL_TABLET | Freq: Two times a day (BID) | ORAL | Status: DC

## 2014-07-06 NOTE — Telephone Encounter (Signed)
Rx refill sent to patient pharmacy   

## 2014-07-21 ENCOUNTER — Encounter (HOSPITAL_COMMUNITY): Payer: Self-pay | Admitting: Psychiatry

## 2014-07-21 ENCOUNTER — Ambulatory Visit (INDEPENDENT_AMBULATORY_CARE_PROVIDER_SITE_OTHER): Payer: 59 | Admitting: Psychiatry

## 2014-07-21 VITALS — BP 120/68 | Ht 63.0 in | Wt 135.0 lb

## 2014-07-21 DIAGNOSIS — F411 Generalized anxiety disorder: Secondary | ICD-10-CM

## 2014-07-21 MED ORDER — PAXIL 40 MG PO TABS: ORAL_TABLET | ORAL | Status: DC

## 2014-07-21 NOTE — Progress Notes (Signed)
Patient ID: [name] [name], female   DOB: Jul 11, 1951, 63 y.o.   MRN: [id number] Patient ID: [name] [name], female   DOB: 09/03/1951, 63 y.o.   MRN: [id number] Patient ID: [name] [name], female   DOB: Aug 25, 1951, 63 y.o.   MRN: [id number] [place] [address] Progress Note [name] [name] MRN: [id number] DOB: 19-Sep-1951 Age: 62 y.o.  Date: 07/21/2014  Chief Complaint: Chief Complaint  Patient presents with  . Anxiety  . Follow-up   History of present illness Patient is 63 year old married Caucasian female lives with her husband in [place]. 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 She has 2 children and 3 grandchildren.  The patient states she's had anxiety issues for at least 20 years. She has mitral valve prolapse and she claims it "tore up my nerves." She's tried numerous medicines at Paxil as worked the best for her. Currently her anxiety is well controlled her mood is good. She doesn't sleep much at night only gets 3-4 hours of rest. This has been going on for about 5 years and she's not sure why she. She claims she's adjusted to it.  The patient returns after 4 months. She continues to do well. Her mood is stable. She still doesn't sleep as much as she would like at night but she takes naps during the day to make up for it. She has been worried about her brother who is recovering from pancreatitis and she still misses her mom who died last Christmas. However she is not significantly depressed. She is staying busy with her house and yard work  Current psychiatric medication Paxil 60 mg daily  Family History family history includes ADD / ADHD in her brother; Alcohol abuse in her brother, brother, and brother; Anxiety disorder in her maternal aunt, maternal grandmother, and mother; Bipolar disorder in her other; Drug abuse in her brother. There is no history of Dementia, OCD, Paranoid behavior, Physical abuse, Schizophrenia, Seizures, or Sexual abuse.  Medical  history Hyperlipidemia, hypertension and diabetes.  Psychosocial history Patient lives with her husband. She has been married for 42 years. She has 2 children lives in [place].  Mental status examination Patient is pleasant cooperative and maintained a good eye contact. She is well-groomed and well-dressed. She is appropriate to her stated age. She described her mood as good and her affect is mood congruent. She's alert and oriented x3. Her attention and concentration is okay. She denies any active or passive suicidal thinking and homicidal thinking. She denies any auditory or visual hallucination. Her thought process is logical linear and goal-directed. There are no paranoia or delusions present at this time. Her insight judgment and impulse control is okay.  Lab Results:  No results found for this or any previous visit (from the past 8736 hour(s)). PCP draws routine labs and cholesterol and blood sugar are the primary concerns.   Assessment  Axis I Anxiety disorder Axis II deferred Axis III see medical history Axis IV mild to moderate  Plan/Discussion: Patient is stable at Paxil 60 mg.  Recommend to continue.  Risk and benefits of the medication explain.  Recommend to call us back if she is any question or concern if she feels worsening of the symptom.  Followup in 4 months.  Medical Decision Making Problem Points:  Established problem, stable/improving (1), Review of last therapy session (1) and Review of psycho-social stressors (1) Data Points:  Review or order clinical lab tests (1) Review of medication regiment & side effects (2)  I certify that outpatient services furnished can reasonably be expected to improve the patient's condition.   Levonne Spiller, MD

## 2014-07-22 ENCOUNTER — Telehealth: Payer: Self-pay | Admitting: Cardiovascular Disease

## 2014-07-22 NOTE — Telephone Encounter (Signed)
Closed encounter °

## 2014-08-09 ENCOUNTER — Other Ambulatory Visit: Payer: Self-pay

## 2014-08-09 MED ORDER — ATENOLOL 25 MG PO TABS: 50.0000 mg | ORAL_TABLET | Freq: Two times a day (BID) | ORAL | Status: DC

## 2014-08-09 NOTE — Telephone Encounter (Signed)
Rx was sent to pharmacy electronically. 

## 2014-08-17 ENCOUNTER — Ambulatory Visit (INDEPENDENT_AMBULATORY_CARE_PROVIDER_SITE_OTHER): Payer: 59 | Admitting: Psychiatry

## 2014-08-17 ENCOUNTER — Telehealth (HOSPITAL_COMMUNITY): Payer: Self-pay | Admitting: *Deleted

## 2014-08-17 ENCOUNTER — Encounter (HOSPITAL_COMMUNITY): Payer: Self-pay | Admitting: Psychiatry

## 2014-08-17 VITALS — BP 135/65 | HR 89 | Ht 63.0 in | Wt 136.2 lb

## 2014-08-17 DIAGNOSIS — F3289 Other specified depressive episodes: Secondary | ICD-10-CM

## 2014-08-17 DIAGNOSIS — F329 Major depressive disorder, single episode, unspecified: Secondary | ICD-10-CM

## 2014-08-17 DIAGNOSIS — F411 Generalized anxiety disorder: Secondary | ICD-10-CM

## 2014-08-17 MED ORDER — ARIPIPRAZOLE 2 MG PO TABS: 2.0000 mg | ORAL_TABLET | Freq: Every day | ORAL | Status: DC

## 2014-08-17 NOTE — Telephone Encounter (Signed)
It would be best for patient to come in this week. Please call back to schedule appt

## 2014-08-17 NOTE — Telephone Encounter (Signed)
pt calling stating her Paxil is not working. Pt states she is sad all the time, feeling depressed and not motivated to do anything. Pt would like for Dr. Harrington Challenger to call her back [phone].

## 2014-08-17 NOTE — Telephone Encounter (Signed)
Called pt and scheduled her for 08-17-14. Pt agreed.

## 2014-08-17 NOTE — Progress Notes (Signed)
Patient ID: [name] [name], female   DOB: 1951-02-19, 63 y.o.   MRN: [id number] Patient ID: [name] [name] , female   DOB: 1951-01-07, 63 y.o.   MRN: [id number] Patient ID: [name] [name], female   DOB: 01/08/1951, 63 y.o.   MRN: [id number] Patient ID: [name] [name], female   DOB: 11/20/1951, 63 y.o.   MRN: [id number] [name] [address] Progress Note [name] [name] MRN: [id number] DOB: [birth year] Age: 63 y.o.  Date: 08/17/2014  Chief Complaint: Chief Complaint  Patient presents with  . Anxiety  . Depression  . Follow-up   History of present illness Patient is 63 year old married Caucasian female lives with her husband in [place]. 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 She has 2 children and 3 grandchildren.  The patient states she's had anxiety issues for at least 20 years. She has mitral valve prolapse and she claims it "tore up my nerves." She's tried numerous medicines at Paxil as worked the best for her. Currently her anxiety is well controlled her mood is good. She doesn't sleep much at night only gets 3-4 hours of rest. This has been going on for about 5 years and she's not sure why she. She claims she's adjusted to it.  The patient returns after 4 weeks. She came back early because she has not been feeling right. She doesn't think the Paxil to working for her anymore because she feels more depressed. She's sad and has low energy. She's not suicidal and doesn't have any psychotic symptoms. She tried Wellbutrin before but it made her anxious. Prozac and Zoloft and Celexa did not help. She's very wary of switching antidepressants that might agree to augmentation so I suggested we add a low dose of Abilify  Current psychiatric medication Paxil 60 mg daily  Family History family history includes ADD / ADHD in her brother; Alcohol abuse in her brother, brother, and brother; Anxiety disorder in her maternal aunt, maternal grandmother, and mother; Bipolar disorder in  her other; Drug abuse in her brother. There is no history of Dementia, OCD, Paranoid behavior, Physical abuse, Schizophrenia, Seizures, or Sexual abuse.  Medical history Hyperlipidemia, hypertension and diabetes.  Psychosocial history Patient lives with her husband. She has been married for 42 years. She has 2 children lives in [place].  Mental status examination Patient is pleasant cooperative and maintained a good eye contact. She is well-groomed and well-dressed. She is appropriate to her stated age. She described her mood as low and her affect is congruent. She's thinking a lot about recent deaths in her family. She's alert and oriented x3. Her attention and concentration is okay. She denies any active or passive suicidal thinking and homicidal thinking. She denies any auditory or visual hallucination. Her thought process is logical linear and goal-directed. There are no paranoia or delusions present at this time. Her insight judgment and impulse control is okay.  Lab Results:  No results found for this or any previous visit (from the past 8736 hour(s)). PCP draws routine labs and cholesterol and blood sugar are the primary concerns.   Assessment  Axis I Anxiety disorder Axis II deferred Axis III see medical history Axis IV mild to moderate  Plan/Discussion: Patient  is not doing as well on  Paxil 60 mg.  we'll add Abilify 2 mg daily Risk and benefits of the medication explain.  Recommend to call us back if she is any question or concern if she feels worsening of the symptom.  Followup in  4 weeks  Medical Decision Making Problem Points:  Established problem, stable/improving (1), Review of last therapy session (1) and Review of psycho-social stressors (1) Data Points:  Review or order clinical lab tests (1) Review of medication regiment & side effects (2)  I certify that outpatient services furnished can reasonably be expected to improve the patient's condition.   Levonne Spiller, MD

## 2014-09-06 ENCOUNTER — Other Ambulatory Visit: Payer: Self-pay | Admitting: *Deleted

## 2014-09-06 ENCOUNTER — Telehealth: Payer: Self-pay | Admitting: Cardiovascular Disease

## 2014-09-06 DIAGNOSIS — R5383 Other fatigue: Secondary | ICD-10-CM

## 2014-09-06 DIAGNOSIS — E119 Type 2 diabetes mellitus without complications: Secondary | ICD-10-CM

## 2014-09-06 DIAGNOSIS — R5381 Other malaise: Secondary | ICD-10-CM

## 2014-09-06 DIAGNOSIS — Z79899 Other long term (current) drug therapy: Secondary | ICD-10-CM

## 2014-09-06 DIAGNOSIS — E785 Hyperlipidemia, unspecified: Secondary | ICD-10-CM

## 2014-09-06 NOTE — Telephone Encounter (Signed)
Message sent to Dr.Kelly's nurse Barry Brunner.

## 2014-09-06 NOTE — Telephone Encounter (Signed)
[name] [name] is needing a lab order mailed out to her before her appt on 09/16/14.. Thanks

## 2014-09-06 NOTE — Telephone Encounter (Signed)
Labs ordered and slips mailed to patient. 

## 2014-09-14 LAB — COMPREHENSIVE METABOLIC PANEL
ALT: 21 U/L (ref 0–35)
AST: 28 U/L (ref 0–37)
Albumin: 4.2 g/dL (ref 3.5–5.2)
Alkaline Phosphatase: 52 U/L (ref 39–117)
BUN: 9 mg/dL (ref 6–23)
CALCIUM: 9.8 mg/dL (ref 8.4–10.5)
CHLORIDE: 102 meq/L (ref 96–112)
CO2: 28 meq/L (ref 19–32)
Creat: 0.73 mg/dL (ref 0.50–1.10)
Glucose, Bld: 146 mg/dL — ABNORMAL HIGH (ref 70–99)
Potassium: 4.2 mEq/L (ref 3.5–5.3)
SODIUM: 139 meq/L (ref 135–145)
TOTAL PROTEIN: 7.4 g/dL (ref 6.0–8.3)
Total Bilirubin: 0.4 mg/dL (ref 0.2–1.2)

## 2014-09-14 LAB — CBC
HCT: 35.3 % — ABNORMAL LOW (ref 36.0–46.0)
HEMOGLOBIN: 12.1 g/dL (ref 12.0–15.0)
MCH: 26.8 pg (ref 26.0–34.0)
MCHC: 34.3 g/dL (ref 30.0–36.0)
MCV: 78.1 fL (ref 78.0–100.0)
PLATELETS: 107 10*3/uL — AB (ref 150–400)
RBC: 4.52 MIL/uL (ref 3.87–5.11)
RDW: 15.4 % (ref 11.5–15.5)
WBC: 5.7 10*3/uL (ref 4.0–10.5)

## 2014-09-14 LAB — HEMOGLOBIN A1C
Hgb A1c MFr Bld: 9.3 % — ABNORMAL HIGH (ref <5.7)
MEAN PLASMA GLUCOSE: 220 mg/dL — AB (ref <117)

## 2014-09-14 LAB — LIPID PANEL
CHOLESTEROL: 224 mg/dL — AB (ref 0–200)
HDL: 69 mg/dL (ref >39)
LDL Cholesterol: 128 mg/dL — ABNORMAL HIGH (ref 0–99)
Total CHOL/HDL Ratio: 3.2 Ratio
Triglycerides: 136 mg/dL (ref <150)
VLDL: 27 mg/dL (ref 0–40)

## 2014-09-14 LAB — TSH: TSH: 2.208 u[IU]/mL (ref 0.350–4.500)

## 2014-09-16 ENCOUNTER — Encounter: Payer: Self-pay | Admitting: Cardiovascular Disease

## 2014-09-16 ENCOUNTER — Ambulatory Visit (INDEPENDENT_AMBULATORY_CARE_PROVIDER_SITE_OTHER): Payer: 59 | Admitting: Cardiovascular Disease

## 2014-09-16 ENCOUNTER — Encounter (HOSPITAL_COMMUNITY): Payer: Self-pay | Admitting: Psychiatry

## 2014-09-16 ENCOUNTER — Ambulatory Visit (INDEPENDENT_AMBULATORY_CARE_PROVIDER_SITE_OTHER): Payer: 59 | Admitting: Psychiatry

## 2014-09-16 VITALS — BP 118/59 | HR 71 | Ht 63.0 in | Wt 135.2 lb

## 2014-09-16 VITALS — BP 128/60 | HR 78 | Ht 63.0 in | Wt 136.3 lb

## 2014-09-16 DIAGNOSIS — E118 Type 2 diabetes mellitus with unspecified complications: Secondary | ICD-10-CM

## 2014-09-16 DIAGNOSIS — R002 Palpitations: Secondary | ICD-10-CM

## 2014-09-16 DIAGNOSIS — F411 Generalized anxiety disorder: Secondary | ICD-10-CM

## 2014-09-16 DIAGNOSIS — IMO0002 Reserved for concepts with insufficient information to code with codable children: Secondary | ICD-10-CM | POA: Insufficient documentation

## 2014-09-16 DIAGNOSIS — E1169 Type 2 diabetes mellitus with other specified complication: Secondary | ICD-10-CM | POA: Insufficient documentation

## 2014-09-16 DIAGNOSIS — E119 Type 2 diabetes mellitus without complications: Secondary | ICD-10-CM

## 2014-09-16 DIAGNOSIS — F331 Major depressive disorder, recurrent, moderate: Secondary | ICD-10-CM

## 2014-09-16 DIAGNOSIS — F419 Anxiety disorder, unspecified: Secondary | ICD-10-CM

## 2014-09-16 DIAGNOSIS — E1165 Type 2 diabetes mellitus with hyperglycemia: Secondary | ICD-10-CM | POA: Insufficient documentation

## 2014-09-16 DIAGNOSIS — Z23 Encounter for immunization: Secondary | ICD-10-CM

## 2014-09-16 MED ORDER — PAROXETINE HCL 40 MG PO TABS: ORAL_TABLET | ORAL | Status: DC

## 2014-09-16 MED ORDER — NEBIVOLOL HCL 10 MG PO TABS: 10.0000 mg | ORAL_TABLET | Freq: Every day | ORAL | Status: DC

## 2014-09-16 MED ORDER — ARIPIPRAZOLE 5 MG PO TABS: 5.0000 mg | ORAL_TABLET | Freq: Every day | ORAL | Status: DC

## 2014-09-16 MED ORDER — NEBIVOLOL HCL 5 MG PO TABS: 5.0000 mg | ORAL_TABLET | Freq: Every day | ORAL | Status: DC

## 2014-09-16 NOTE — Progress Notes (Signed)
Patient ID: [name] [name], female   DOB: [birth year], 63 y.o.   MRN: [id number]     HPI: [name] [name] is a 63 y.o. female who presents to the office today for a one-year cardiology evaluation.   Ms. Sampley has a history of tachypalpitations which have been controlled with  beta blocker therapy. Remotely, she has a history of hepatosteatorrhea. She has documented mild sclerosis and mild to moderate aortic insufficiency or aortic valve. Her last echo Doppler study was done in 2012. Laboratory  by Dr. Luan Pulling in January 2014 which showed a glucose of 164. Total cholesterol 249 triglycerides 155 LDL cholesterol 157. She has had myalgias secondary to Lipitor, Crestor, and Zocor. Remotely, she did have mild LFT elevation in the setting of significant hyperglycemia and hepatosteatorrhea.  A two-year followup echo Dopplers in 05/20/2013 showed an ejection fraction of 60-65%. She had grade 1 diastolic dysfunction. Aortic valve mildly calcified annulus. She had mild-to-moderate regurgitation. Was mild aortic sclerosis. There is mild mitral regurgitation.  The palpitations have been improved with the increased dose of atenolol to 50 mg.  Since I last saw her, she admits to significant increase in depression.  Her mother died on Christmas day.  Her brother died in 04-02-23.  And her other brother recently was diagnosed with cancer.  She has been on Paxil 40 mg and recently Abilify was added to her medical regimen.  She is concerned perhaps that the beta blocker therapy, which currently is atenolol.  May potentially be contributing to some of her depression.  She has her flu shot given in the office today and this was administered.  Past Medical History  Diagnosis Date  . Anxiety   . Diabetes mellitus type II   . Depression   . MVP (mitral valve prolapse)   . Rectocele   . Constipation 03/23/2013  . Hyperlipemia 02/21/09    NUC STRESS TEST-NORMAL- EF 74%  . Elevated cholesterol     Past Surgical  History  Procedure Laterality Date  . Abdominal hysterectomy    . Carpel tunnel Bilateral 12/17/2009  . Tubal ligation    . Bilateral salpingoophorectomy      Allergies  Allergen Reactions  . Codeine Other (See Comments)    Took it years ago and doesn't remember the reaction    Current Outpatient Prescriptions  Medication Sig Dispense Refill  . ARIPiprazole (ABILIFY) 5 MG tablet Take 1 tablet (5 mg total) by mouth daily.  30 tablet  2  . benzonatate (TESSALON) 100 MG capsule as needed.       Marland Kitchen CINNAMON PO Take 1,000 mg by mouth daily.      . clotrimazole-betamethasone (LOTRISONE) cream as needed.       Marland Kitchen co-enzyme Q-10 30 MG capsule Take 200 mg by mouth daily. Takes $RemoveBefo'200mg'uoBkLrwKiDk$       . desoximetasone (TOPICORT) 0.25 % cream as needed.       Javier Docker Oil 300 MG CAPS Take by mouth daily.      . metFORMIN (GLUCOPHAGE) 500 MG tablet 500 mg 2 (two) times daily with a meal.       . Multiple Vitamins-Minerals (HAIR/SKIN/NAILS PO) Take by mouth daily.      . nebivolol (BYSTOLIC) 10 MG tablet Take 1 tablet (10 mg total) by mouth daily.  30 tablet  6  . PARoxetine (PAXIL) 40 MG tablet Take 1 and 1/2 tab daily  45 tablet  4  . WELCHOL 625 MG tablet Taking 6 Tablets daily      .  ZETIA 10 MG tablet Take 10 mg by mouth daily.        No current facility-administered medications for this visit.    Socially she is married 6 years. She has 2 children. She completed ninth grade education. There is no alcohol use. There is a history of snuff for tobacco use.   ROS General: Negative; No fevers, chills, or night sweats;  HEENT: Negative; No changes in vision or hearing, sinus congestion, difficulty swallowing Pulmonary: Negative; No cough, wheezing, shortness of breath, hemoptysis Cardiovascular: Negative; No chest pain, presyncope, syncope, palpitations GI: Negative; No nausea, vomiting, diarrhea, or abdominal pain GU: Negative; No dysuria, hematuria, or difficulty voiding Musculoskeletal: Negative; no  myalgias, joint pain, or weakness Hematologic/Oncology: Negative; no easy bruising, bleeding Endocrine: Negative; no heat/cold intolerance; no diabetes Neuro: Negative; no changes in balance, headaches Skin: Negative; No rashes or skin lesions Psychiatric: Positive for depression; No behavioral problems,  Sleep: Negative; No snoring, daytime sleepiness, hypersomnolence, bruxism, restless legs, hypnogognic hallucinations, no cataplexy Other comprehensive 14 point system review is negative.   PE BP 128/60  Pulse 78  Ht $R'5\' 3"'iA$  (1.6 m)  Wt 136 lb 4.8 oz (61.825 kg)  BMI 24.15 kg/m2  General: Alert, oriented, no distress.  Skin: normal turgor, no rashes HEENT: Normocephalic, atraumatic. Pupils round and reactive; sclera anicteric;no lid lag.  Nose without nasal septal hypertrophy Mouth/Parynx benign; Mallinpatti scale 2/3 Neck: No JVD, no carotid bruits with normal carotid Lungs: clear to ausculatation and percussion; no wheezing or rales Heart: RRR, s1 s2 normal 9-8/9 systolic murmur in the aortic region and a 1/6 diastolic murmur compatible with her aortic sclerosis and aortic insufficiency. Abdomen: soft, nontender; no hepatosplenomehaly, BS+; abdominal aorta nontender and not dilated by palpation. Pulses 2+ Extremities: no clubbing cyanosis or edema, Homan's sign negative  Neurologic: grossly nonfocal Psychological: Recent exacerbation of depression.  ECG (independently read by the): Sinus rhythm, 78 beats per minute.  ST-T changes V1 through V3, which are old.  Prior to July 2014 ECG: Sinus rhythm at 66 beats per minute. They're nondiagnostic T changes precordially.  LABS:  BMET    Component Value Date/Time   NA 139 09/13/2014 0730   K 4.2 09/13/2014 0730   CL 102 09/13/2014 0730   CO2 28 09/13/2014 0730   GLUCOSE 146* 09/13/2014 0730   BUN 9 09/13/2014 0730   CREATININE 0.73 09/13/2014 0730   CREATININE 0.66 01/09/2008 1239   CALCIUM 9.8 09/13/2014 0730   GFRNONAA >60 01/09/2008  1239   GFRAA  Value: >60        The eGFR has been calculated using the MDRD equation. This calculation has not been validated in all clinical 01/09/2008 1239     Hepatic Function Panel     Component Value Date/Time   PROT 7.4 09/13/2014 0730   ALBUMIN 4.2 09/13/2014 0730   AST 28 09/13/2014 0730   ALT 21 09/13/2014 0730   ALKPHOS 52 09/13/2014 0730   BILITOT 0.4 09/13/2014 0730   BILIDIR 0.1 10/22/2007 1112     CBC    Component Value Date/Time   WBC 5.7 09/13/2014 0730   RBC 4.52 09/13/2014 0730   HGB 12.1 09/13/2014 0730   HCT 35.3* 09/13/2014 0730   PLT 107* 09/13/2014 0730   MCV 78.1 09/13/2014 0730   MCH 26.8 09/13/2014 0730   MCHC 34.3 09/13/2014 0730   RDW 15.4 09/13/2014 0730   MONOABS 0.6 11/17/2007 1451   EOSABS 0.0 11/17/2007 1451   BASOSABS 0.0 11/17/2007 1451  BNP No results found for this basename: probnp    Lipid Panel     Component Value Date/Time   CHOL 224* 09/13/2014 0730     RADIOLOGY: No results found.    ASSESSMENT AND PLAN: Ms. Bergren is now 63 years old.  Her palpitations have improved with her increased atenolol dose to 50 mg.  However, recently, she has had increasing depression and now is on 2 medications.  I suspect this may be in reaction to the recent family deaths.  She was concerned about the possibility of beta blocker therapy contributed to his some depressive symptoms.  I elected to give her samples of diastolic take in place of atenolol.  She will start this at 5 mg and titrate this to 10 mg as tolerated and necessary for recurrent palpitations.  I reviewed her recent laboratory which revealed a hemoglobin A1c of 9.3.  I have recommended further titration of her metformin to 1000 mg twice a day and that she see Dr. Luan Pulling back in followup of her diabetes mellitus.  She has been intolerant to statin therapy.  She now is on WelChol and Zetia for hyperlipidemia.  Her most recent lipid studies were elevated with a cholesterol of 224, triglycerides  136, HDL 69, and LDL 128.  She did receive a flu shot in the office today.  I will see her back in the office in 4 months for followup evaluation.  Time spent 25 minutes  Troy Sine, MD, [facility]  09/16/2014 6:00 PM

## 2014-09-16 NOTE — Patient Instructions (Addendum)
Your physician has recommended you make the following change in your medication:STOP the atenolol. Start new prescription for Bystolic 10 mg. Take 1/2 tablet daily for 1 week then increase to 1 tablet daily.  Your physician recommends that you schedule a follow-up appointment in: 4 months.

## 2014-09-16 NOTE — Progress Notes (Signed)
Patient ID: [name] [name], female   DOB: 10-10-1951, 63 y.o.   MRN: [id number] Patient ID: [name] [name], female   DOB: 1951-02-17, 63 y.o.   MRN: [id number] Patient ID: [name] [name], female   DOB: 1951-04-29, 63 y.o.   MRN: [id number] Patient ID: [name] [name], female   DOB: 14-Jun-1951, 63 y.o.   MRN: [id number] Patient ID: [name] [name], female   DOB: 1951-07-14, 63 y.o.   MRN: [id number] [place] [address] Progress Note [name] [name] MRN: [id number] DOB: July 07, 1951 Age: 63 y.o.  Date: 09/16/2014  Chief Complaint: Chief Complaint  Patient presents with  . Anxiety  . Depression  . Follow-up   History of present illness Patient is 63 year old married Caucasian female lives with her husband in [place]. 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 She has 2 children and 3 grandchildren.  The patient states she's had anxiety issues for at least 20 years. She has mitral valve prolapse and she claims it "tore up my nerves." She's tried numerous medicines at Paxil as worked the best for her. Currently her anxiety is well controlled her mood is good. She doesn't sleep much at night only gets 3-4 hours of rest. This has been going on for about 5 years and she's not sure why she. She claims she's adjusted to it.  The patient returns after 4 weeks. Last time she was more depressed and wanted something to help as the Paxil was not working well by itself. We added Abilify 2 mg and is helped to some degree. Her energy is much better and she's doing more but she still feels sad. She's lost 3 family members in the last year and her brother is dying of cancer and her son recently moved away out of town. All these things have been "too much" she's not suicidal and wants to feel better. She's wary of changing antidepressants but agrees to an increased dose of Abilify  Current psychiatric medication Paxil 60 mg daily  Family History family history includes ADD / ADHD in her brother;  Alcohol abuse in her brother, brother, and brother; Anxiety disorder in her maternal aunt, maternal grandmother, and mother; Bipolar disorder in her other; Drug abuse in her brother. There is no history of Dementia, OCD, Paranoid behavior, Physical abuse, Schizophrenia, Seizures, or Sexual abuse.  Medical history Hyperlipidemia, hypertension and diabetes.  Psychosocial history Patient lives with her husband. She has been married for 42 years. She has 2 children lives in [place].  Mental status examination Patient is pleasant cooperative and maintained a good eye contact. She is well-groomed and well-dressed. She is appropriate to her stated age. She described her mood as low and her affect is congruent. She's thinking a lot about recent deaths in her family. She's alert and oriented x3. Her attention and concentration is okay. She denies any active or passive suicidal thinking and homicidal thinking. She denies any auditory or visual hallucination. Her thought process is logical linear and goal-directed. There are no paranoia or delusions present at this time. Her insight judgment and impulse control is okay.  Lab Results:  Results for orders placed in visit on 09/06/14 (from the past 8736 hour(s))  CBC   Collection Time    09/13/14  7:30 AM      Result Value Ref Range   WBC 5.7  4.0 - 10.5 K/uL   RBC 4.52  3.87 - 5.11 MIL/uL   Hemoglobin 12.1  12.0 - 15.0 g/dL  HCT 35.3 (*) 36.0 - 46.0 %   MCV 78.1  78.0 - 100.0 fL   MCH 26.8  26.0 - 34.0 pg   MCHC 34.3  30.0 - 36.0 g/dL   RDW 15.4  11.5 - 15.5 %   Platelets 107 (*) 150 - 400 K/uL  COMPREHENSIVE METABOLIC PANEL   Collection Time    09/13/14  7:30 AM      Result Value Ref Range   Sodium 139  135 - 145 mEq/L   Potassium 4.2  3.5 - 5.3 mEq/L   Chloride 102  96 - 112 mEq/L   CO2 28  19 - 32 mEq/L   Glucose, Bld 146 (*) 70 - 99 mg/dL   BUN 9  6 - 23 mg/dL   Creat 0.73  0.50 - 1.10 mg/dL   Total Bilirubin 0.4  0.2 - 1.2 mg/dL    Alkaline Phosphatase 52  39 - 117 U/L   AST 28  0 - 37 U/L   ALT 21  0 - 35 U/L   Total Protein 7.4  6.0 - 8.3 g/dL   Albumin 4.2  3.5 - 5.2 g/dL   Calcium 9.8  8.4 - 10.5 mg/dL  LIPID PANEL   Collection Time    09/13/14  7:30 AM      Result Value Ref Range   Cholesterol 224 (*) 0 - 200 mg/dL   Triglycerides 136  <150 mg/dL   HDL 69  >39 mg/dL   Total CHOL/HDL Ratio 3.2     VLDL 27  0 - 40 mg/dL   LDL Cholesterol 128 (*) 0 - 99 mg/dL  HEMOGLOBIN A1C   Collection Time    09/13/14  7:30 AM      Result Value Ref Range   Hemoglobin A1C 9.3 (*) <5.7 %   Mean Plasma Glucose 220 (*) <117 mg/dL  TSH   Collection Time    09/13/14  7:30 AM      Result Value Ref Range   TSH 2.208  0.350 - 4.500 uIU/mL   PCP draws routine labs and cholesterol and blood sugar are the primary concerns.   Assessment  Axis I Anxiety disorder Axis II deferred Axis III see medical history Axis IV mild to moderate  Plan/Discussion: Patient will continue Paxil 60 mg daily and increase Abilify from 2-5 mg daily Risk and benefits of the medication explain.  Recommend to call us back if she is any question or concern if she feels worsening of the symptom.  Followup in 4 weeks  Medical Decision Making Problem Points:  Established problem, stable/improving (1), Review of last therapy session (1) and Review of psycho-social stressors (1) Data Points:  Review or order clinical lab tests (1) Review of medication regiment & side effects (2)  I certify that outpatient services furnished can reasonably be expected to improve the patient's condition.   Levonne Spiller, MD

## 2014-09-16 NOTE — Patient Instructions (Signed)
Take 3 mg of Abilify for a couple days, then 4 mg for a couple days, then start 5 mg tablets

## 2014-09-23 ENCOUNTER — Telehealth: Payer: Self-pay | Admitting: *Deleted

## 2014-09-23 NOTE — Telephone Encounter (Signed)
Message copied by Lauralee Evener on Thu Sep 23, 2014  4:25 PM ------      Message from: Shelva Majestic A      Created: Thu Sep 23, 2014  9:39 AM       PLT low; Inc Glu and HbA1c, need better DM management; intolerant to statins, on zetia and welchol ------

## 2014-09-23 NOTE — Telephone Encounter (Signed)
Called patient to give lab results she informs me that he discussed them with her at the office appointment earlier this week.

## 2014-10-13 ENCOUNTER — Telehealth: Payer: Self-pay | Admitting: Cardiovascular Disease

## 2014-10-13 NOTE — Telephone Encounter (Signed)
Spoke to pt. She stated that she was previously taking Atenolol for her heart palpitations, but at the beginning of the month her and Dr. Claiborne Billings decided to discontinue that and try Bystolic. Pt feels that after taking the Bystolic for several weeks she is having more palpitations than she was with the Atenolol. Pt said that she requested to switch medications because she thought the Atenolol was adding to her depression. She states she still is having feelings of depression even with stopping the Atenolol. Pt would like to go back to taking the Atenolol because she felt it was working better for her.

## 2014-10-13 NOTE — Telephone Encounter (Signed)
Please call,having more palpitations since she started taking Bystolic.

## 2014-10-14 ENCOUNTER — Ambulatory Visit (INDEPENDENT_AMBULATORY_CARE_PROVIDER_SITE_OTHER): Payer: 59 | Admitting: Psychiatry

## 2014-10-14 ENCOUNTER — Encounter (HOSPITAL_COMMUNITY): Payer: Self-pay | Admitting: Psychiatry

## 2014-10-14 VITALS — BP 137/69 | HR 77 | Ht 63.0 in | Wt 133.2 lb

## 2014-10-14 DIAGNOSIS — F411 Generalized anxiety disorder: Secondary | ICD-10-CM

## 2014-10-14 DIAGNOSIS — F419 Anxiety disorder, unspecified: Secondary | ICD-10-CM

## 2014-10-14 MED ORDER — PAROXETINE HCL 40 MG PO TABS: ORAL_TABLET | ORAL | Status: DC

## 2014-10-14 MED ORDER — ARIPIPRAZOLE 5 MG PO TABS: 5.0000 mg | ORAL_TABLET | Freq: Every day | ORAL | Status: DC

## 2014-10-14 NOTE — Telephone Encounter (Signed)
Can go back to atenolol or increase the bystolic dose

## 2014-10-14 NOTE — Progress Notes (Signed)
Patient ID: [name] [name], female   DOB: 04/23/1951, 63 y.o.   MRN: [id number] Patient ID: [name] [name], female   DOB: Nov 19, 1951, 63 y.o.   MRN: [id number] Patient ID: [name] [name], female   DOB: 03-28-1951, 63 y.o.   MRN: [id number] Patient ID: [name] [name], female   DOB: March 25, 1951, 63 y.o.   MRN: [id number] Patient ID: [name] [name], female   DOB: 03/03/1951, 63 y.o.   MRN: [id number] Patient ID: [name] [name], female   DOB: 25-May-1951, 63 y.o.   MRN: [id number] [place] [address] Progress Note [name] [name] MRN: [id number] DOB: [birth year] Age: 63 y.o.  Date: 10/14/2014  Chief Complaint: Chief Complaint  Patient presents with  . Anxiety  . Depression  . Follow-up   History of present illness Patient is 64 year old married Caucasian female lives with her husband in [place]. 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 She has 2 children and 3 grandchildren.  The patient states she's had anxiety issues for at least 20 years. She has mitral valve prolapse and she claims it "tore up my nerves." She's tried numerous medicines at [facility] as worked the best for her. Currently her anxiety is well controlled her mood is good. She doesn't sleep much at night only gets 3-4 hours of rest. This has been going on for about 5 years and she's not sure why she. She claims she's adjusted to it.  The patient returns after 4 weeks. She's still feeling somewhat sad but the Abilify has helped her energy quite a bit. She thought perhaps the atenolol is contributing to her depression and her cardiologist changed her to bystolic but it's not made any difference in her palpitations are worse. She is going back to the atenolol. She mentioned today that she felt better when she took the name Brand Paxil so I suggested we try this for 4 weeks before we change her antidepressants. She mentions a number of losses in the last year including her mother brother-in-law and also the fact that her  brother developed cancer over the summer  Current psychiatric medication Paxil 60 mg daily  Family History family history includes ADD / ADHD in her brother; Alcohol abuse in her brother, brother, and brother; Anxiety disorder in her maternal aunt, maternal grandmother, and mother; Bipolar disorder in her other; Drug abuse in her brother. There is no history of Dementia, OCD, Paranoid behavior, Physical abuse, Schizophrenia, Seizures, or Sexual abuse.  Medical history Hyperlipidemia, hypertension and diabetes.  Psychosocial history Patient lives with her husband. She has been married for 42 years. She has 2 children lives in [place].  Mental status examination Patient is pleasant cooperative and maintained a good eye contact. She is well-groomed and well-dressed. She is appropriate to her stated age. She described her mood as low and her affect is congruent. She's thinking a lot about recent deaths in her family. She's alert and oriented x3. Her attention and concentration is okay. She denies any active or passive suicidal thinking and homicidal thinking. She denies any auditory or visual hallucination. Her thought process is logical linear and goal-directed. There are no paranoia or delusions present at this time. Her insight judgment and impulse control is okay.  Lab Results:  Results for orders placed in visit on 09/06/14 (from the past 8736 hour(s))  CBC   Collection Time    09/13/14  7:30 AM      Result Value Ref Range   WBC 5.7  4.0 -  10.5 K/uL   RBC 4.52  3.87 - 5.11 MIL/uL   Hemoglobin 12.1  12.0 - 15.0 g/dL   HCT 35.3 (*) 36.0 - 46.0 %   MCV 78.1  78.0 - 100.0 fL   MCH 26.8  26.0 - 34.0 pg   MCHC 34.3  30.0 - 36.0 g/dL   RDW 15.4  11.5 - 15.5 %   Platelets 107 (*) 150 - 400 K/uL  COMPREHENSIVE METABOLIC PANEL   Collection Time    09/13/14  7:30 AM      Result Value Ref Range   Sodium 139  135 - 145 mEq/L   Potassium 4.2  3.5 - 5.3 mEq/L   Chloride 102  96 - 112  mEq/L   CO2 28  19 - 32 mEq/L   Glucose, Bld 146 (*) 70 - 99 mg/dL   BUN 9  6 - 23 mg/dL   Creat 0.73  0.50 - 1.10 mg/dL   Total Bilirubin 0.4  0.2 - 1.2 mg/dL   Alkaline Phosphatase 52  39 - 117 U/L   AST 28  0 - 37 U/L   ALT 21  0 - 35 U/L   Total Protein 7.4  6.0 - 8.3 g/dL   Albumin 4.2  3.5 - 5.2 g/dL   Calcium 9.8  8.4 - 10.5 mg/dL  LIPID PANEL   Collection Time    09/13/14  7:30 AM      Result Value Ref Range   Cholesterol 224 (*) 0 - 200 mg/dL   Triglycerides 136  <150 mg/dL   HDL 69  >39 mg/dL   Total CHOL/HDL Ratio 3.2     VLDL 27  0 - 40 mg/dL   LDL Cholesterol 128 (*) 0 - 99 mg/dL  HEMOGLOBIN A1C   Collection Time    09/13/14  7:30 AM      Result Value Ref Range   Hemoglobin A1C 9.3 (*) <5.7 %   Mean Plasma Glucose 220 (*) <117 mg/dL  TSH   Collection Time    09/13/14  7:30 AM      Result Value Ref Range   TSH 2.208  0.350 - 4.500 uIU/mL   PCP draws routine labs and cholesterol and blood sugar are the primary concerns.   Assessment  Axis I Anxiety disorder Axis II deferred Axis III see medical history Axis IV mild to moderate  Plan/Discussion: Patient will continue Paxil 60 mg daily but we will make sure it is name brand only and continue Abilify 5 mg daily Risk and benefits of the medication explain.  Recommend to call us back if she is any question or concern if she feels worsening of the symptom.  Followup in 2 months  Medical Decision Making Problem Points:  Established problem, stable/improving (1), Review of last therapy session (1) and Review of psycho-social stressors (1) Data Points:  Review or order clinical lab tests (1) Review of medication regiment & side effects (2)  I certify that outpatient services furnished can reasonably be expected to improve the patient's condition.   Levonne Spiller, MD

## 2014-10-15 MED ORDER — ATENOLOL 50 MG PO TABS: 50.0000 mg | ORAL_TABLET | Freq: Two times a day (BID) | ORAL | Status: DC

## 2014-10-15 NOTE — Telephone Encounter (Addendum)
Pt decided after talking with Dr. Harrington Challenger (see office visit note 10/29) that she would no longer be taking the Bystolic and go back to taking the Atenolol. Dr. Claiborne Billings also stated that pt could go back on Atenolol. Pt voiced understanding that she would discontinue Bystolic and restart 50MG  of Atenolol BID. Pt requested refill. Refill request complete.

## 2014-10-15 NOTE — Telephone Encounter (Signed)
If she has resumed atenolol, she was on atenolol 50 mg daily not bid. Therefore advise either 25 mg bid or 50 mg daily.

## 2014-10-15 NOTE — Telephone Encounter (Signed)
Spoke with Pt.   Pt explained that she was taking "1 pill in the AM and 1 pill in the PM". After talking to the [facility] where she gets medicines filled I learned that the 1 pill she was describing was a 25mg  Atenolol. So the patient was only taking a total dose of 50mg  of Atenolol a day. Dr. Claiborne Billings ordered for her to take 100mg  total daily. I explained this to the patient. She voiced the understanding that she misunderstood what the pill bottle said. Pt is now clear that Dr. Claiborne Billings wants her to take 50mg  in the morning and 50mg  in the evening. She was concerned, as was I, about the change in dosage she would be taking. I informed the patient of symptoms of hypotension and told her if she experiences them to stop taking a whole pill and only take a half of pill, also to contact the office and make [place] aware. Will follow up with patient in a week or two.

## 2014-10-18 ENCOUNTER — Encounter (HOSPITAL_COMMUNITY): Payer: Self-pay | Admitting: Psychiatry

## 2014-10-19 MED ORDER — ATENOLOL 50 MG PO TABS: 50.0000 mg | ORAL_TABLET | Freq: Every day | ORAL | Status: DC

## 2014-10-19 NOTE — Addendum Note (Signed)
Addended by: Raiford Simmonds on: 10/19/2014 09:15 AM   Modules accepted: Orders

## 2014-10-19 NOTE — Telephone Encounter (Addendum)
CALLED AND SPOKE TO PATIENT  INFORMATION GVIEN PER DR KELLY. PATIENT IS ONLY TO TAKE 50 MG  DAILY. PATIENT VERBALIZED UNDERSTANDING  RN CHANGED MEDICATION ON ENCOUNTER

## 2014-10-20 LAB — MICROALBUMIN, URINE: Microalb, Ur: 1

## 2014-11-17 ENCOUNTER — Ambulatory Visit (HOSPITAL_COMMUNITY): Payer: Self-pay | Admitting: Psychiatry

## 2014-12-02 ENCOUNTER — Ambulatory Visit (INDEPENDENT_AMBULATORY_CARE_PROVIDER_SITE_OTHER): Payer: 59 | Admitting: Psychiatry

## 2014-12-02 ENCOUNTER — Encounter (HOSPITAL_COMMUNITY): Payer: Self-pay | Admitting: Psychiatry

## 2014-12-02 VITALS — BP 136/76 | HR 88 | Ht 63.0 in | Wt 132.0 lb

## 2014-12-02 DIAGNOSIS — F419 Anxiety disorder, unspecified: Secondary | ICD-10-CM

## 2014-12-02 DIAGNOSIS — F331 Major depressive disorder, recurrent, moderate: Secondary | ICD-10-CM

## 2014-12-02 DIAGNOSIS — F411 Generalized anxiety disorder: Secondary | ICD-10-CM

## 2014-12-02 MED ORDER — SERTRALINE HCL 100 MG PO TABS: 100.0000 mg | ORAL_TABLET | Freq: Every day | ORAL | Status: DC

## 2014-12-02 MED ORDER — ARIPIPRAZOLE 5 MG PO TABS: 5.0000 mg | ORAL_TABLET | Freq: Every day | ORAL | Status: DC

## 2014-12-02 NOTE — Progress Notes (Signed)
Patient ID: SHAWNA WEARING, female   DOB: [birth year], 63 y.o.   MRN: [id number] Patient ID: VIKKIE GOEDEN, female   DOB: Sep 29, 1951, 63 y.o.   MRN: [id number] Patient ID: MAKELA NIEHOFF, female   DOB: Apr 08, 1951, 63 y.o.   MRN: [id number] Patient ID: ABCDE ONEIL, female   DOB: 1951/04/23, 63 y.o.   MRN: [id number] Patient ID: KINA SHIFFMAN, female   DOB: November 17, 1951, 63 y.o.   MRN: [id number] Patient ID: NATHALY DAWKINS, female   DOB: [birth year], 63 y.o.   MRN: [id number] Patient ID: CASSONDRA STACHOWSKI, female   DOB: 1951-04-14, 63 y.o.   MRN: [id number] [place] [address] Progress Note AERON LHEUREUX MRN: [id number] DOB: [birth year] Age: 63 y.o.  Date: 12/02/2014  Chief Complaint: Chief Complaint  Patient presents with  . Depression  . Anxiety  . Follow-up   History of present illness Patient is 63 year old married Caucasian female lives with her husband in [place]. 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 She has 2 children and 3 grandchildren.  The patient states she's had anxiety issues for at least 20 years. She has mitral valve prolapse and she claims it "tore up my nerves." She's tried numerous medicines at Paxil as worked the best for her. Currently her anxiety is well controlled her mood is good. She doesn't sleep much at night only gets 3-4 hours of rest. This has been going on for about 5 years and she's not sure why she. She claims she's adjusted to it.  The patient returns after 4 weeks. She's still feeling sad. This year's been particularly difficult because her mother died last year on Christmas day. Her brother-in-law died in the last year as well. We tried brand-name Paxil but it's made no difference. She's tried numerous antidepressants but she's never tried Zoloft so I think we need to make this switch and she agrees. She denies being suicidal  Current psychiatric medication Paxil 60 mg daily Abilify 5 mg daily Family History family history includes  ADD / ADHD in her brother; Alcohol abuse in her brother, brother, and brother; Anxiety disorder in her maternal aunt, maternal grandmother, and mother; Bipolar disorder in her other; Drug abuse in her brother. There is no history of Dementia, OCD, Paranoid behavior, Physical abuse, Schizophrenia, Seizures, or Sexual abuse.  Medical history Hyperlipidemia, hypertension and diabetes.  Psychosocial history Patient lives with her husband. She has been married for 42 years. She has 2 children lives in [place].  Mental status examination Patient is pleasant cooperative and maintained a good eye contact. She is well-groomed and well-dressed. She is appropriate to her stated age. She described her mood as l depressed and her affect is congruent. She's thinking a lot about recent deaths in her family. She's alert and oriented x3. Her attention and concentration is okay. She denies any active or passive suicidal thinking and homicidal thinking. She denies any auditory or visual hallucination. Her thought process is logical linear and goal-directed. There are no paranoia or delusions present at this time. Her insight judgment and impulse control is okay.  Lab Results:  Results for orders placed or performed in visit on 09/06/14 (from the past 8736 hour(s))  CBC   Collection Time: 09/13/14  7:30 AM  Result Value Ref Range   WBC 5.7 4.0 - 10.5 K/uL   RBC 4.52 3.87 - 5.11 MIL/uL   Hemoglobin 12.1 12.0 - 15.0 g/dL   HCT 35.3 (L) 36.0 - 46.0 %  MCV 78.1 78.0 - 100.0 fL   MCH 26.8 26.0 - 34.0 pg   MCHC 34.3 30.0 - 36.0 g/dL   RDW 15.4 11.5 - 15.5 %   Platelets 107 (L) 150 - 400 K/uL  Comprehensive metabolic panel   Collection Time: 09/13/14  7:30 AM  Result Value Ref Range   Sodium 139 135 - 145 mEq/L   Potassium 4.2 3.5 - 5.3 mEq/L   Chloride 102 96 - 112 mEq/L   CO2 28 19 - 32 mEq/L   Glucose, Bld 146 (H) 70 - 99 mg/dL   BUN 9 6 - 23 mg/dL   Creat 0.73 0.50 - 1.10 mg/dL   Total Bilirubin  0.4 0.2 - 1.2 mg/dL   Alkaline Phosphatase 52 39 - 117 U/L   AST 28 0 - 37 U/L   ALT 21 0 - 35 U/L   Total Protein 7.4 6.0 - 8.3 g/dL   Albumin 4.2 3.5 - 5.2 g/dL   Calcium 9.8 8.4 - 10.5 mg/dL  Lipid panel   Collection Time: 09/13/14  7:30 AM  Result Value Ref Range   Cholesterol 224 (H) 0 - 200 mg/dL   Triglycerides 136 <150 mg/dL   HDL 69 >39 mg/dL   Total CHOL/HDL Ratio 3.2 Ratio   VLDL 27 0 - 40 mg/dL   LDL Cholesterol 128 (H) 0 - 99 mg/dL  Hemoglobin A1c   Collection Time: 09/13/14  7:30 AM  Result Value Ref Range   Hgb A1c MFr Bld 9.3 (H) <5.7 %   Mean Plasma Glucose 220 (H) <117 mg/dL  TSH   Collection Time: 09/13/14  7:30 AM  Result Value Ref Range   TSH 2.208 0.350 - 4.500 uIU/mL   PCP draws routine labs and cholesterol and blood sugar are the primary concerns.   Assessment  Axis I Anxiety disorder Axis II deferred Axis III see medical history Axis IV mild to moderate  Plan/Discussion: Patient will discontinue Paxil 60 mg daily and switch to Zoloft 100 mg daily and continue Abilify 5 mg daily Risk and benefits of the medication explain.  Recommend to call us back if she is any question or concern if she feels worsening of the symptom.  Followup in 4 weeks. She declines counseling  Medical Decision Making Problem Points:  Established problem, stable/improving (1), Review of last therapy session (1) and Review of psycho-social stressors (1) Data Points:  Review or order clinical lab tests (1) Review of medication regiment & side effects (2)  I certify that outpatient services furnished can reasonably be expected to improve the patient's condition.   Levonne Spiller, MD

## 2014-12-07 ENCOUNTER — Ambulatory Visit (HOSPITAL_COMMUNITY): Payer: Self-pay | Admitting: Psychiatry

## 2014-12-13 ENCOUNTER — Telehealth (HOSPITAL_COMMUNITY): Payer: Self-pay | Admitting: *Deleted

## 2014-12-13 NOTE — Telephone Encounter (Signed)
Pt is no longer taking Paxil. Pt is taking Sertraline HCL. Fax is about drug therapy if pt is on both medications. Pt was d/c from Paxil during last office visit.

## 2014-12-13 NOTE — Telephone Encounter (Signed)
FAX PATIENT INFORMATION REGARDING PAXIL AND SERTRALINE HCL.  PLEASE SEE FAXED INFORMATION SHEET.

## 2015-01-05 ENCOUNTER — Encounter (HOSPITAL_COMMUNITY): Payer: Self-pay | Admitting: Psychiatry

## 2015-01-05 ENCOUNTER — Ambulatory Visit (INDEPENDENT_AMBULATORY_CARE_PROVIDER_SITE_OTHER): Payer: 59 | Admitting: Psychiatry

## 2015-01-05 VITALS — BP 134/64 | HR 71 | Ht 63.0 in | Wt 138.0 lb

## 2015-01-05 DIAGNOSIS — F411 Generalized anxiety disorder: Secondary | ICD-10-CM

## 2015-01-05 DIAGNOSIS — F419 Anxiety disorder, unspecified: Secondary | ICD-10-CM

## 2015-01-05 DIAGNOSIS — F331 Major depressive disorder, recurrent, moderate: Secondary | ICD-10-CM

## 2015-01-05 MED ORDER — ARIPIPRAZOLE 5 MG PO TABS: 5.0000 mg | ORAL_TABLET | Freq: Every day | ORAL | Status: DC

## 2015-01-05 MED ORDER — SERTRALINE HCL 100 MG PO TABS: ORAL_TABLET | ORAL | Status: DC

## 2015-01-05 NOTE — Progress Notes (Signed)
Patient ID: [name] [name], female   DOB: 1951-05-14, 64 y.o.   MRN: [id number] Patient ID: [name] [name], female   DOB: Feb 25, 1951, 64 y.o.   MRN: [id number] Patient ID: [name] [name], female   DOB: April 07, 1951, 64 y.o.   MRN: [id number] Patient ID: [name] [name], female   DOB: 03-20-1951, 64 y.o.   MRN: [id number] Patient ID: [name] [name], female   DOB: 01-27-1951, 64 y.o.   MRN: [id number] Patient ID: [name] [name], female   DOB: [birth year], 64 y.o.   MRN: [id number] Patient ID: [name] [name], female   DOB: 15-Sep-1951, 64 y.o.   MRN: [id number] Patient ID: [name] [name], female   DOB: Jun 10, 1951, 64 y.o.   MRN: [id number] [name] [name] Progress Note [name] [name] MRN: [id number] DOB: 1951-09-11 Age: 64 y.o.  Date: 01/05/2015  Chief Complaint: Chief Complaint  Patient presents with  . Depression  . Anxiety  . Follow-up   History of present illness Patient is 64 year old married Caucasian female lives with her husband in [place]. 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 She has 2 children and 3 grandchildren.  The patient states she's had anxiety issues for at least 20 years. She has mitral valve prolapse and she claims it "tore up my nerves." She's tried numerous medicines at Paxil as worked the best for her. Currently her anxiety is well controlled her mood is good. She doesn't sleep much at night only gets 3-4 hours of rest. This has been going on for about 5 years and she's not sure why she. She claims she's adjusted to it.  The patient returns after 4 weeks. She is doing a little bit better and since we switched to Zoloft. She still has bad days but she has more good days and she had in the past. She doesn't feel like she's quite back to herself yet and I suggested we up the Zoloft 150 mg daily and she is in agreement. She denies suicidal ideation  Current psychiatric medication Zoloft 100 mg daily Abilify 5 mg daily Family History family history  includes ADD / ADHD in her brother; Alcohol abuse in her brother, brother, and brother; Anxiety disorder in her maternal aunt, maternal grandmother, and mother; Bipolar disorder in her other; Drug abuse in her brother. There is no history of Dementia, OCD, Paranoid behavior, Physical abuse, Schizophrenia, Seizures, or Sexual abuse.  Medical history Hyperlipidemia, hypertension and diabetes.  Psychosocial history Patient lives with her husband. She has been married for 42 years. She has 2 children lives in [place].  Mental status examination Patient is pleasant cooperative and maintained a good eye contact. She is well-groomed and well-dressed. She is appropriate to her stated age. She described her mood as improved and her affect is brighter She's thinking a lot about recent deaths in her family. She's alert and oriented x3. Her attention and concentration is okay. She denies any active or passive suicidal thinking and homicidal thinking. She denies any auditory or visual hallucination. Her thought process is logical linear and goal-directed. There are no paranoia or delusions present at this time. Her insight judgment and impulse control is okay.  Lab Results:  Results for orders placed or performed in visit on 09/06/14 (from the past 8736 hour(s))  CBC   Collection Time: 09/13/14  7:30 AM  Result Value Ref Range   WBC 5.7 4.0 - 10.5 K/uL   RBC 4.52 3.87 - 5.11 MIL/uL   Hemoglobin 12.1 12.0 -  15.0 g/dL   HCT 35.3 (L) 36.0 - 46.0 %   MCV 78.1 78.0 - 100.0 fL   MCH 26.8 26.0 - 34.0 pg   MCHC 34.3 30.0 - 36.0 g/dL   RDW 15.4 11.5 - 15.5 %   Platelets 107 (L) 150 - 400 K/uL  Comprehensive metabolic panel   Collection Time: 09/13/14  7:30 AM  Result Value Ref Range   Sodium 139 135 - 145 mEq/L   Potassium 4.2 3.5 - 5.3 mEq/L   Chloride 102 96 - 112 mEq/L   CO2 28 19 - 32 mEq/L   Glucose, Bld 146 (H) 70 - 99 mg/dL   BUN 9 6 - 23 mg/dL   Creat 0.73 0.50 - 1.10 mg/dL   Total  Bilirubin 0.4 0.2 - 1.2 mg/dL   Alkaline Phosphatase 52 39 - 117 U/L   AST 28 0 - 37 U/L   ALT 21 0 - 35 U/L   Total Protein 7.4 6.0 - 8.3 g/dL   Albumin 4.2 3.5 - 5.2 g/dL   Calcium 9.8 8.4 - 10.5 mg/dL  Lipid panel   Collection Time: 09/13/14  7:30 AM  Result Value Ref Range   Cholesterol 224 (H) 0 - 200 mg/dL   Triglycerides 136 <150 mg/dL   HDL 69 >39 mg/dL   Total CHOL/HDL Ratio 3.2 Ratio   VLDL 27 0 - 40 mg/dL   LDL Cholesterol 128 (H) 0 - 99 mg/dL  Hemoglobin A1c   Collection Time: 09/13/14  7:30 AM  Result Value Ref Range   Hgb A1c MFr Bld 9.3 (H) <5.7 %   Mean Plasma Glucose 220 (H) <117 mg/dL  TSH   Collection Time: 09/13/14  7:30 AM  Result Value Ref Range   TSH 2.208 0.350 - 4.500 uIU/mL   PCP draws routine labs and cholesterol and blood sugar are the primary concerns.   Assessment  Axis I Anxiety disorder Axis II deferred Axis III see medical history Axis IV mild to moderate  Plan/Discussion: Patient will increase Zoloft 150 mg daily and continue Abilify 5 mg daily Risk and benefits of the medication explain.  Recommend to call us back if she is any question or concern if she feels worsening of the symptom.  Followup in 6 weeks. She declines counseling  Medical Decision Making Problem Points:  Established problem, stable/improving (1), Review of last therapy session (1) and Review of psycho-social stressors (1) Data Points:  Review or order clinical lab tests (1) Review of medication regiment & side effects (2)  I certify that outpatient services furnished can reasonably be expected to improve the patient's condition.   Levonne Spiller, MD

## 2015-02-16 ENCOUNTER — Ambulatory Visit (INDEPENDENT_AMBULATORY_CARE_PROVIDER_SITE_OTHER): Payer: 59 | Admitting: Psychiatry

## 2015-02-16 ENCOUNTER — Encounter (HOSPITAL_COMMUNITY): Payer: Self-pay | Admitting: Psychiatry

## 2015-02-16 VITALS — BP 130/54 | HR 69 | Ht 63.0 in | Wt 140.0 lb

## 2015-02-16 DIAGNOSIS — F411 Generalized anxiety disorder: Secondary | ICD-10-CM

## 2015-02-16 DIAGNOSIS — F419 Anxiety disorder, unspecified: Secondary | ICD-10-CM

## 2015-02-16 MED ORDER — SERTRALINE HCL 100 MG PO TABS: 100.0000 mg | ORAL_TABLET | Freq: Every day | ORAL | Status: DC

## 2015-02-16 NOTE — Progress Notes (Signed)
Patient ID: [name] [name], female   DOB: [birth year], 64 y.o.   MRN: [id number] Patient ID: [name] [name], female   DOB: 1950-12-24, 64 y.o.   MRN: [id number] Patient ID: [name] [name], female   DOB: [birth year], 64 y.o.   MRN: [id number] Patient ID: [name] [name], female   DOB: January 07, 1951, 64 y.o.   MRN: [id number] Patient ID: [name] [name], female   DOB: May 04, 1951, 64 y.o.   MRN: [id number] Patient ID: [name] [name], female   DOB: Mar 27, 1951, 64 y.o.   MRN: [id number] Patient ID: [name] [name], female   DOB: 15-Feb-1951, 64 y.o.   MRN: [id number] Patient ID: [name] [name], female   DOB: [birth year], 64 y.o.   MRN: [id number] Patient ID: [name] [name], female   DOB: 1951/12/10, 64 y.o.   MRN: [id number] [place] [address] Progress Note WILFRED SIVERSON MRN: [id number] DOB: [birth year] Age: 64 y.o.  Date: 02/16/2015  Chief Complaint: Chief Complaint  Patient presents with  . Depression  . Follow-up   History of present illness Patient is 64 year old married Caucasian female lives with her husband in [place]. 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 She has 2 children and 3 grandchildren.  The patient states she's had anxiety issues for at least 20 years. She has mitral valve prolapse and she claims it "tore up my nerves." She's tried numerous medicines at [facility] as worked the best for her. Currently her anxiety is well controlled her mood is good. She doesn't sleep much at night only gets 3-4 hours of rest. This has been going on for about 5 years and she's not sure why she. She claims she's adjusted to it.  The patient returns after 6 weeks. She's been doing a little bit better on Zoloft. She doesn't like being on the Abilify as she does not see any benefit from it and is also causing her to eat all the time. She recently found out that her brothers cancer that started in his mouth has now spread to his lung and bones. She is very depressed about this as well. Last year  she lost her brother-in-law. She denies being suicidal and she still doing everything she needs to do at home. She would like to continue with the Zoloft. When we tried to increase it 250 mg she had heart palpitations and went back to the 100 mg dosage  Current psychiatric medication Zoloft 100 mg daily Abilify 5 mg daily Family History family history includes ADD / ADHD in her brother; Alcohol abuse in her brother, brother, and brother; Anxiety disorder in her maternal aunt, maternal grandmother, and mother; Bipolar disorder in her other; Drug abuse in her brother. There is no history of Dementia, OCD, Paranoid behavior, Physical abuse, Schizophrenia, Seizures, or Sexual abuse.  Medical history Hyperlipidemia, hypertension and diabetes.  Psychosocial history Patient lives with her husband. She has been married for 42 years. She has 2 children lives in [place].  Mental status examination Patient is pleasant cooperative and maintained a good eye contact. She is well-groomed and well-dressed. She is appropriate to her stated age. She described her mood as improved and her affect is brighter She's thinking a lot about recent deaths in her family and her brother's illness She's alert and oriented x3. Her attention and concentration is okay. She denies any active or passive suicidal thinking and homicidal thinking. She denies any auditory or visual hallucination. Her thought process is logical linear and goal-directed. There are no  paranoia or delusions present at this time. Her insight judgment and impulse control is okay.  Lab Results:  Results for orders placed or performed in visit on 09/06/14 (from the past 8736 hour(s))  CBC   Collection Time: 09/13/14  7:30 AM  Result Value Ref Range   WBC 5.7 4.0 - 10.5 K/uL   RBC 4.52 3.87 - 5.11 MIL/uL   Hemoglobin 12.1 12.0 - 15.0 g/dL   HCT 35.3 (L) 36.0 - 46.0 %   MCV 78.1 78.0 - 100.0 fL   MCH 26.8 26.0 - 34.0 pg   MCHC 34.3 30.0 - 36.0  g/dL   RDW 15.4 11.5 - 15.5 %   Platelets 107 (L) 150 - 400 K/uL  Comprehensive metabolic panel   Collection Time: 09/13/14  7:30 AM  Result Value Ref Range   Sodium 139 135 - 145 mEq/L   Potassium 4.2 3.5 - 5.3 mEq/L   Chloride 102 96 - 112 mEq/L   CO2 28 19 - 32 mEq/L   Glucose, Bld 146 (H) 70 - 99 mg/dL   BUN 9 6 - 23 mg/dL   Creat 0.73 0.50 - 1.10 mg/dL   Total Bilirubin 0.4 0.2 - 1.2 mg/dL   Alkaline Phosphatase 52 39 - 117 U/L   AST 28 0 - 37 U/L   ALT 21 0 - 35 U/L   Total Protein 7.4 6.0 - 8.3 g/dL   Albumin 4.2 3.5 - 5.2 g/dL   Calcium 9.8 8.4 - 10.5 mg/dL  Lipid panel   Collection Time: 09/13/14  7:30 AM  Result Value Ref Range   Cholesterol 224 (H) 0 - 200 mg/dL   Triglycerides 136 <150 mg/dL   HDL 69 >39 mg/dL   Total CHOL/HDL Ratio 3.2 Ratio   VLDL 27 0 - 40 mg/dL   LDL Cholesterol 128 (H) 0 - 99 mg/dL  Hemoglobin A1c   Collection Time: 09/13/14  7:30 AM  Result Value Ref Range   Hgb A1c MFr Bld 9.3 (H) <5.7 %   Mean Plasma Glucose 220 (H) <117 mg/dL  TSH   Collection Time: 09/13/14  7:30 AM  Result Value Ref Range   TSH 2.208 0.350 - 4.500 uIU/mL   PCP draws routine labs and cholesterol and blood sugar are the primary concerns.   Assessment  Axis I Anxiety disorder Axis II deferred Axis III see medical history Axis IV mild to moderate  Plan/Discussion: Patient will continue Zoloft 100 mg daily and discontinue Abilify Risk and benefits of the medication explain.  Recommend to call us back if she is any question or concern if she feels worsening of the symptom.  Followup in 2 months. She declines counseling  Medical Decision Making Problem Points:  Established problem, stable/improving (1), Review of last therapy session (1) and Review of psycho-social stressors (1) Data Points:  Review or order clinical lab tests (1) Review of medication regiment & side effects (2)  I certify that outpatient services furnished can reasonably be expected to improve  the patient's condition.   Levonne Spiller, MD

## 2015-03-10 ENCOUNTER — Telehealth: Payer: Self-pay | Admitting: Cardiovascular Disease

## 2015-03-10 NOTE — Telephone Encounter (Signed)
Spoke with pt, she states her palpitations have increased. Changes in medications include stopped paxil and started zoloft. She stopped krill oil and started fish oil. She is back on atenolol 50 mg once daily. No dietary changes, no other symptoms. Will forward for dr [facility] review.

## 2015-03-10 NOTE — Telephone Encounter (Signed)
Pt have been having palpitations,more than usual.

## 2015-03-12 NOTE — Telephone Encounter (Signed)
Change to atenolol 50 mg in am and 25 mg in pm; if palpitations continue, then 50 mg bid  If BP and P ok Hold if P < 54.

## 2015-03-14 NOTE — Telephone Encounter (Signed)
Left message for pt to call.

## 2015-03-14 NOTE — Telephone Encounter (Signed)
Spoke with pt, aware of dr [facility] recommendations. She reports she stopped the fish oil and the palpitations have improved. She is aware she can increase the atenolol if needed but she will need to let us know if she does that. Patient voiced understanding

## 2015-04-17 ENCOUNTER — Other Ambulatory Visit (HOSPITAL_COMMUNITY): Payer: Self-pay | Admitting: Psychiatry

## 2015-04-18 ENCOUNTER — Ambulatory Visit (INDEPENDENT_AMBULATORY_CARE_PROVIDER_SITE_OTHER): Payer: 59 | Admitting: Psychiatry

## 2015-04-18 ENCOUNTER — Encounter (HOSPITAL_COMMUNITY): Payer: Self-pay | Admitting: Psychiatry

## 2015-04-18 VITALS — BP 138/75 | HR 75 | Ht 63.0 in | Wt 134.8 lb

## 2015-04-18 DIAGNOSIS — F419 Anxiety disorder, unspecified: Secondary | ICD-10-CM

## 2015-04-18 DIAGNOSIS — F411 Generalized anxiety disorder: Secondary | ICD-10-CM

## 2015-04-18 DIAGNOSIS — F331 Major depressive disorder, recurrent, moderate: Secondary | ICD-10-CM

## 2015-04-18 MED ORDER — SERTRALINE HCL 100 MG PO TABS: ORAL_TABLET | ORAL | Status: DC

## 2015-04-18 NOTE — Progress Notes (Signed)
Patient ID: [name] [name], female   DOB: April 09, 1951, 64 y.o.   MRN: [id number] Patient ID: [name] [name], female   DOB: 1951-02-28, 64 y.o.   MRN: [id number] Patient ID: [name] [name], female   DOB: [birth year], 64 y.o.   MRN: [id number] Patient ID: [name] [name], female   DOB: [birth year], 64 y.o.   MRN: [id number] Patient ID: [name] [name], female   DOB: 1951/07/05, 64 y.o.   MRN: [id number] Patient ID: [name] [name], female   DOB: [birth year], 64 y.o.   MRN: [id number] Patient ID: [name] [name], female   DOB: [birth year], 64 y.o.   MRN: [id number] Patient ID: [name] [name], female   DOB: [birth year], 64 y.o.   MRN: [id number] Patient ID: [name] [name], female   DOB: 1951/10/20, 64 y.o.   MRN: [id number] [place] [address] Progress Note [name] [name] MRN: [id number] DOB: 08/10/1951 Age: 64 y.o.  Date: 04/18/2015  Chief Complaint: Chief Complaint  Patient presents with  . Depression  . Anxiety  . Follow-up   History of present illness Patient is 64 year old married Caucasian female lives with her husband in [place]. 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 She has 2 children and 3 grandchildren.  The patient states she's had anxiety issues for at least 20 years. She has mitral valve prolapse and she claims it "tore up my nerves." She's tried numerous medicines at [facility] as worked the best for her. Currently her anxiety is well controlled her mood is good. She doesn't sleep much at night only gets 3-4 hours of rest. This has been going on for about 5 years and she's not sure why she. She claims she's adjusted to it.    The patient returns after 6 weeks. She had to go up on the Zoloft because she was still depressed. She takes 100 mg in the morning and 50 in the evening. She felt like it was causing heart palpitations so she called her cardiologist and he suggested she go up on her atenolol. This seemed to take care of the palpitations. Her mood is improving and she  is less depressed but she still very worried about her brother who is slowly dying of metastatic esophageal cancer. She no longer takes Abilify doesn't feel like she needs it. She denies suicidal ideation   Current psychiatric medication Zoloft 150 mg daily  Family History family history includes ADD / ADHD in her brother; Alcohol abuse in her brother, brother, and brother; Anxiety disorder in her maternal aunt, maternal grandmother, and mother; Bipolar disorder in her other; Drug abuse in her brother. There is no history of Dementia, OCD, Paranoid behavior, Physical abuse, Schizophrenia, Seizures, or Sexual abuse.  Medical history Hyperlipidemia, hypertension and diabetes.  Psychosocial history Patient lives with her husband. She has been married for 42 years. She has 2 children lives in [place].  Mental status examination Patient is pleasant cooperative and maintained a good eye contact. She is well-groomed and well-dressed. She is appropriate to her stated age. She described her mood as goodand her affect is bright She's alert and oriented x3. Her attention and concentration is okay. She denies any active or passive suicidal thinking and homicidal thinking. She denies any auditory or visual hallucination. Her thought process is logical linear and goal-directed. There are no paranoia or delusions present at this time. Her insight judgment and impulse control is okay.  Lab Results:  Results for orders placed or performed in visit on 09/06/14 (from  the past 8736 hour(s))  CBC   Collection Time: 09/13/14  7:30 AM  Result Value Ref Range   WBC 5.7 4.0 - 10.5 K/uL   RBC 4.52 3.87 - 5.11 MIL/uL   Hemoglobin 12.1 12.0 - 15.0 g/dL   HCT 35.3 (L) 36.0 - 46.0 %   MCV 78.1 78.0 - 100.0 fL   MCH 26.8 26.0 - 34.0 pg   MCHC 34.3 30.0 - 36.0 g/dL   RDW 15.4 11.5 - 15.5 %   Platelets 107 (L) 150 - 400 K/uL  Comprehensive metabolic panel   Collection Time: 09/13/14  7:30 AM  Result Value Ref  Range   Sodium 139 135 - 145 mEq/L   Potassium 4.2 3.5 - 5.3 mEq/L   Chloride 102 96 - 112 mEq/L   CO2 28 19 - 32 mEq/L   Glucose, Bld 146 (H) 70 - 99 mg/dL   BUN 9 6 - 23 mg/dL   Creat 0.73 0.50 - 1.10 mg/dL   Total Bilirubin 0.4 0.2 - 1.2 mg/dL   Alkaline Phosphatase 52 39 - 117 U/L   AST 28 0 - 37 U/L   ALT 21 0 - 35 U/L   Total Protein 7.4 6.0 - 8.3 g/dL   Albumin 4.2 3.5 - 5.2 g/dL   Calcium 9.8 8.4 - 10.5 mg/dL  Lipid panel   Collection Time: 09/13/14  7:30 AM  Result Value Ref Range   Cholesterol 224 (H) 0 - 200 mg/dL   Triglycerides 136 <150 mg/dL   HDL 69 >39 mg/dL   Total CHOL/HDL Ratio 3.2 Ratio   VLDL 27 0 - 40 mg/dL   LDL Cholesterol 128 (H) 0 - 99 mg/dL  Hemoglobin A1c   Collection Time: 09/13/14  7:30 AM  Result Value Ref Range   Hgb A1c MFr Bld 9.3 (H) <5.7 %   Mean Plasma Glucose 220 (H) <117 mg/dL  TSH   Collection Time: 09/13/14  7:30 AM  Result Value Ref Range   TSH 2.208 0.350 - 4.500 uIU/mL   PCP draws routine labs and cholesterol and blood sugar are the primary concerns.   Assessment  Axis I Anxiety disorder Axis II deferred Axis III see medical history Axis IV mild to moderate  Plan/Discussion: Patient will continue Zoloft 100 mg every morning and 50 mg in the p.m. Risk and benefits of the medication explain.  Recommend to call us back if she is any question or concern if she feels worsening of the symptom.  Followup in 3 months. She declines counseling  Medical Decision Making Problem Points:  Established problem, stable/improving (1), Review of last therapy session (1) and Review of psycho-social stressors (1) Data Points:  Review or order clinical lab tests (1) Review of medication regiment & side effects (2)  I certify that outpatient services furnished can reasonably be expected to improve the patient's condition.   Levonne Spiller, MD

## 2015-07-18 LAB — BASIC METABOLIC PANEL
BUN: 7 (ref 4–21)
CREATININE: 0.6 (ref <=1.1)

## 2015-07-19 ENCOUNTER — Ambulatory Visit (HOSPITAL_COMMUNITY): Payer: Self-pay | Admitting: Psychiatry

## 2015-07-22 ENCOUNTER — Encounter: Payer: Self-pay | Admitting: Internal Medicine

## 2015-07-24 ENCOUNTER — Other Ambulatory Visit (HOSPITAL_COMMUNITY): Payer: Self-pay | Admitting: Psychiatry

## 2015-07-25 ENCOUNTER — Encounter (HOSPITAL_COMMUNITY): Payer: Self-pay | Admitting: Psychiatry

## 2015-07-25 ENCOUNTER — Ambulatory Visit (INDEPENDENT_AMBULATORY_CARE_PROVIDER_SITE_OTHER): Payer: 59 | Admitting: Psychiatry

## 2015-07-25 VITALS — BP 126/60 | Ht 63.0 in | Wt 129.0 lb

## 2015-07-25 DIAGNOSIS — F419 Anxiety disorder, unspecified: Secondary | ICD-10-CM | POA: Diagnosis not present

## 2015-07-25 DIAGNOSIS — F411 Generalized anxiety disorder: Secondary | ICD-10-CM

## 2015-07-25 DIAGNOSIS — F331 Major depressive disorder, recurrent, moderate: Secondary | ICD-10-CM

## 2015-07-25 MED ORDER — SERTRALINE HCL 100 MG PO TABS: 100.0000 mg | ORAL_TABLET | Freq: Two times a day (BID) | ORAL | Status: DC

## 2015-07-25 NOTE — Progress Notes (Signed)
Patient ID: [name] [name], female   DOB: 1951-08-25, 64 y.o.   MRN: [id number] Patient ID: [name] [name], female   DOB: 11/23/1951, 64 y.o.   MRN: [id number] Patient ID: [name] [name], female   DOB: 1951-04-24, 64 y.o.   MRN: [id number] Patient ID: [name] [name], female   DOB: July 06, 1951, 64 y.o.   MRN: [id number] Patient ID: [name] [name], female   DOB: Jan 09, 1951, 64 y.o.   MRN: [id number] Patient ID: [name] [name], female   DOB: 02-10-1951, 64 y.o.   MRN: [id number] Patient ID: [name] [name], female   DOB: [birth year], 64 y.o.   MRN: [id number] Patient ID: [name] [name], female   DOB: 09/23/1951, 63 y.o.   MRN: [id number] Patient ID: [name] [name], female   DOB: 05-28-1951, 64 y.o.   MRN: [id number] Patient ID: [name] [name], female   DOB: [birth year], 64 y.o.   MRN: [id number] [place] [address] Progress Note SHEYLI HORWITZ MRN: [id number] DOB: 1951/10/30 Age: 64 y.o.  Date: 07/25/2015  Chief Complaint: Chief Complaint  Patient presents with  . Depression  . Anxiety  . Follow-up   History of present illness Patient is 64 year old married Caucasian female lives with her husband in [place]. 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 She has 2 children and 3 grandchildren.  The patient states she's had anxiety issues for at least 20 years. She has mitral valve prolapse and she claims it "tore up my nerves." She's tried numerous medicines at [facility] as worked the best for her. Currently her anxiety is well controlled her mood is good. She doesn't sleep much at night only gets 3-4 hours of rest. This has been going on for about 5 years and she's not sure why she. She claims she's adjusted to it.    The patient returns after 6 weeks. She has been sad again because her brother with esophageal cancer died on 2023/05/31. She's had no appetite or energy and is lost on 129 pounds. Last March she weighed 140 pounds. She recently saw her primary physician, Dr. Luan Pulling and he's  run a lot of blood work. She is anemic and is taking iron replacement. She is not suicidal but just has low energy and is drawing a lot about the recent death and her family. She had declined an offer of counseling here. I suggested we try going up on her Zoloft and she agrees. In the past she sometimes felt it causes palpitations and I told her that we know right away if this happens   Current psychiatric medication Zoloft 150 mg daily  Family History family history includes ADD / ADHD in her brother; Alcohol abuse in her brother, brother, and brother; Anxiety disorder in her maternal aunt, maternal grandmother, and mother; Bipolar disorder in her other; Drug abuse in her brother. There is no history of Dementia, OCD, Paranoid behavior, Physical abuse, Schizophrenia, Seizures, or Sexual abuse.  Medical history Hyperlipidemia, hypertension and diabetes.  Psychosocial history Patient lives with her husband. She has been married for 42 years. She has 2 children lives in [place].  Mental status examination Patient is pleasant cooperative and maintained a good eye contact. She is well-groomed and well-dressed. She is appropriate to her stated age. She described her mood as low and sad and her affect is dysphoric She's alert and oriented x3. Her attention and concentration is okay. She denies any active or passive suicidal thinking and homicidal thinking. She denies any auditory or  visual hallucination. Her thought process is logical linear and goal-directed. There are no paranoia or delusions present at this time. Her insight judgment and impulse control is okay.  Lab Results:  Results for orders placed or performed in visit on 09/06/14 (from the past 8736 hour(s))  CBC   Collection Time: 09/13/14  7:30 AM  Result Value Ref Range   WBC 5.7 4.0 - 10.5 K/uL   RBC 4.52 3.87 - 5.11 MIL/uL   Hemoglobin 12.1 12.0 - 15.0 g/dL   HCT 35.3 (L) 36.0 - 46.0 %   MCV 78.1 78.0 - 100.0 fL   MCH 26.8  26.0 - 34.0 pg   MCHC 34.3 30.0 - 36.0 g/dL   RDW 15.4 11.5 - 15.5 %   Platelets 107 (L) 150 - 400 K/uL  Comprehensive metabolic panel   Collection Time: 09/13/14  7:30 AM  Result Value Ref Range   Sodium 139 135 - 145 mEq/L   Potassium 4.2 3.5 - 5.3 mEq/L   Chloride 102 96 - 112 mEq/L   CO2 28 19 - 32 mEq/L   Glucose, Bld 146 (H) 70 - 99 mg/dL   BUN 9 6 - 23 mg/dL   Creat 0.73 0.50 - 1.10 mg/dL   Total Bilirubin 0.4 0.2 - 1.2 mg/dL   Alkaline Phosphatase 52 39 - 117 U/L   AST 28 0 - 37 U/L   ALT 21 0 - 35 U/L   Total Protein 7.4 6.0 - 8.3 g/dL   Albumin 4.2 3.5 - 5.2 g/dL   Calcium 9.8 8.4 - 10.5 mg/dL  Lipid panel   Collection Time: 09/13/14  7:30 AM  Result Value Ref Range   Cholesterol 224 (H) 0 - 200 mg/dL   Triglycerides 136 <150 mg/dL   HDL 69 >39 mg/dL   Total CHOL/HDL Ratio 3.2 Ratio   VLDL 27 0 - 40 mg/dL   LDL Cholesterol 128 (H) 0 - 99 mg/dL  Hemoglobin A1c   Collection Time: 09/13/14  7:30 AM  Result Value Ref Range   Hgb A1c MFr Bld 9.3 (H) <5.7 %   Mean Plasma Glucose 220 (H) <117 mg/dL  TSH   Collection Time: 09/13/14  7:30 AM  Result Value Ref Range   TSH 2.208 0.350 - 4.500 uIU/mL   PCP draws routine labs and cholesterol and blood sugar are the primary concerns.   Assessment  Axis I Anxiety disorder Axis II deferred Axis III see medical history Axis IV mild to moderate  Plan/Discussion: Patient will increase Zoloft to 100 mg twice a day for depression Risk and benefits of the medication explain.  Recommend to call us back if she is any question or concern if she feels worsening of the symptom.  Followup in 6 weeks. She declines counseling  Medical Decision Making Problem Points:  Established problem, stable/improving (1), Review of last therapy session (1) and Review of psycho-social stressors (1) Data Points:  Review or order clinical lab tests (1) Review of medication regiment & side effects (2)  I certify that outpatient services furnished  can reasonably be expected to improve the patient's condition.   Levonne Spiller, MD

## 2015-07-27 LAB — FECAL OCCULT BLOOD, GUAIAC: Fecal Occult Blood: NEGATIVE

## 2015-09-06 ENCOUNTER — Encounter (HOSPITAL_COMMUNITY): Payer: Self-pay | Admitting: Psychiatry

## 2015-09-06 ENCOUNTER — Ambulatory Visit (INDEPENDENT_AMBULATORY_CARE_PROVIDER_SITE_OTHER): Payer: 59 | Admitting: Psychiatry

## 2015-09-06 VITALS — BP 127/64 | HR 60 | Ht 63.0 in | Wt 128.8 lb

## 2015-09-06 DIAGNOSIS — F419 Anxiety disorder, unspecified: Secondary | ICD-10-CM

## 2015-09-06 DIAGNOSIS — F331 Major depressive disorder, recurrent, moderate: Secondary | ICD-10-CM

## 2015-09-06 MED ORDER — SERTRALINE HCL 100 MG PO TABS: 100.0000 mg | ORAL_TABLET | Freq: Two times a day (BID) | ORAL | Status: DC

## 2015-09-06 NOTE — Progress Notes (Signed)
Patient ID: [name] [name], female   DOB: 1951-07-17, 64 y.o.   MRN: [id number] Patient ID: [name] [name], female   DOB: 1951-05-09, 64 y.o.   MRN: [id number] Patient ID: [name] [name], female   DOB: 10-23-1951, 64 y.o.   MRN: [id number] Patient ID: [name] [name], female   DOB: Apr 14, 1951, 64 y.o.   MRN: [id number] Patient ID: [name] [name], female   DOB: 1951-05-18, 64 y.o.   MRN: [id number] Patient ID: [name] [name], female   DOB: 1951-05-06, 64 y.o.   MRN: [id number] Patient ID: [name] [name], female   DOB: October 10, 1951, 64 y.o.   MRN: [id number] Patient ID: [name] [name], female   DOB: Oct 15, 1951, 64 y.o.   MRN: [id number] Patient ID: [name] [name], female   DOB: 06/14/1951, 63 y.o.   MRN: [id number] Patient ID: [name] [name], female   DOB: November 14, 1951, 64 y.o.   MRN: [id number] Patient ID: [name] [name], female   DOB: [birth year], 64 y.o.   MRN: [id number] [place] [address] Progress Note [name] [name] MRN: [id number] DOB: Jun 30, 1951 Age: 64 y.o.  Date: 09/06/2015  Chief Complaint: Chief Complaint  Patient presents with  . Depression  . Follow-up   History of present illness Patient is 64 year old married Caucasian female lives with her husband in [place]. 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 She has 2 children and 3 grandchildren.  The patient states she's had anxiety issues for at least 20 years. She has mitral valve prolapse and she claims it "tore up my nerves." She's tried numerous medicines at [facility] as worked the best for her. Currently her anxiety is well controlled her mood is good. She doesn't sleep much at night only gets 3-4 hours of rest. This has been going on for about 5 years and she's not sure why she. She claims she's adjusted to it.    The patient returns after 6 weeks. Last time she was very sad after her brother's death in 05-20-23. She wasn't eating well and had lost 10 pounds over the summer. Her mood was low so I increased her Zoloft  to 100 mg twice a day. She's doing a little bit better but still feels sad and worries a lot. Her sister had to have a carotid endarterectomy yesterday and this has her very worried. She was on Abilify in the past but started gaining weight and we stopped it. However I think she needs something for augmentation so we will try Rexulti   Current psychiatric medication Zoloft 200 mg daily  Family History family history includes ADD / ADHD in her brother; Alcohol abuse in her brother, brother, and brother; Anxiety disorder in her maternal aunt, maternal grandmother, and mother; Bipolar disorder in her other; Drug abuse in her brother. There is no history of Dementia, OCD, Paranoid behavior, Physical abuse, Schizophrenia, Seizures, or Sexual abuse.  Medical history Hyperlipidemia, hypertension and diabetes.  Psychosocial history Patient lives with her husband. She has been married for 42 years. She has 2 children lives in [place].  Mental status examination Patient is pleasant cooperative and maintained a good eye contact. She is well-groomed and well-dressed. However she looks tired and very thin She is appropriate to her stated age. She described her mood as low and sad and her affect is dysphoric She's alert and oriented x3. Her attention and concentration is okay. She denies any active or passive suicidal thinking and homicidal thinking. She denies any auditory or visual hallucination. Her  thought process is logical linear and goal-directed. There are no paranoia or delusions present at this time. Her insight judgment and impulse control is okay.  Lab Results:  Results for orders placed or performed in visit on 09/06/14 (from the past 8736 hour(s))  CBC   Collection Time: 09/13/14  7:30 AM  Result Value Ref Range   WBC 5.7 4.0 - 10.5 K/uL   RBC 4.52 3.87 - 5.11 MIL/uL   Hemoglobin 12.1 12.0 - 15.0 g/dL   HCT 35.3 (L) 36.0 - 46.0 %   MCV 78.1 78.0 - 100.0 fL   MCH 26.8 26.0 - 34.0 pg    MCHC 34.3 30.0 - 36.0 g/dL   RDW 15.4 11.5 - 15.5 %   Platelets 107 (L) 150 - 400 K/uL  Comprehensive metabolic panel   Collection Time: 09/13/14  7:30 AM  Result Value Ref Range   Sodium 139 135 - 145 mEq/L   Potassium 4.2 3.5 - 5.3 mEq/L   Chloride 102 96 - 112 mEq/L   CO2 28 19 - 32 mEq/L   Glucose, Bld 146 (H) 70 - 99 mg/dL   BUN 9 6 - 23 mg/dL   Creat 0.73 0.50 - 1.10 mg/dL   Total Bilirubin 0.4 0.2 - 1.2 mg/dL   Alkaline Phosphatase 52 39 - 117 U/L   AST 28 0 - 37 U/L   ALT 21 0 - 35 U/L   Total Protein 7.4 6.0 - 8.3 g/dL   Albumin 4.2 3.5 - 5.2 g/dL   Calcium 9.8 8.4 - 10.5 mg/dL  Lipid panel   Collection Time: 09/13/14  7:30 AM  Result Value Ref Range   Cholesterol 224 (H) 0 - 200 mg/dL   Triglycerides 136 <150 mg/dL   HDL 69 >39 mg/dL   Total CHOL/HDL Ratio 3.2 Ratio   VLDL 27 0 - 40 mg/dL   LDL Cholesterol 128 (H) 0 - 99 mg/dL  Hemoglobin A1c   Collection Time: 09/13/14  7:30 AM  Result Value Ref Range   Hgb A1c MFr Bld 9.3 (H) <5.7 %   Mean Plasma Glucose 220 (H) <117 mg/dL  TSH   Collection Time: 09/13/14  7:30 AM  Result Value Ref Range   TSH 2.208 0.350 - 4.500 uIU/mL   PCP draws routine labs and cholesterol and blood sugar are the primary concerns.   Assessment  Axis I Anxiety disorder Axis II deferred Axis III see medical history Axis IV mild to moderate  Plan/Discussion: Patient will continue Zoloft to 100 mg twice a day for depression. I have given her samples of Rexulti for augmentation Risk and benefits of the medication explain.  Recommend to call us back if she is any question or concern if she feels worsening of the symptom.  Followup in 4 weeks. She declines counseling  Medical Decision Making Problem Points:  Established problem, stable/improving (1), Review of last therapy session (1) and Review of psycho-social stressors (1) Data Points:  Review or order clinical lab tests (1) Review of medication regiment & side effects (2)  I  certify that outpatient services furnished can reasonably be expected to improve the patient's condition.   Levonne Spiller, MD

## 2015-09-23 ENCOUNTER — Encounter: Payer: Self-pay | Admitting: Internal Medicine

## 2015-09-23 ENCOUNTER — Ambulatory Visit (INDEPENDENT_AMBULATORY_CARE_PROVIDER_SITE_OTHER): Payer: Commercial Managed Care - HMO | Admitting: Internal Medicine

## 2015-09-23 VITALS — BP 126/54 | HR 76 | Ht 63.0 in | Wt 131.0 lb

## 2015-09-23 DIAGNOSIS — R197 Diarrhea, unspecified: Secondary | ICD-10-CM | POA: Diagnosis not present

## 2015-09-23 DIAGNOSIS — D509 Iron deficiency anemia, unspecified: Secondary | ICD-10-CM | POA: Diagnosis not present

## 2015-09-23 NOTE — Patient Instructions (Signed)
  You have been scheduled for a colonoscopy. Please follow written instructions given to you at your visit today.  Please pick up your over the counter prep supplies at the store. If you use inhalers (even only as needed), please bring them with you on the day of your procedure. Your physician has requested that you go to www.startemmi.com and enter the access code given to you at your visit today. This web site gives a general overview about your procedure. However, you should still follow specific instructions given to you by our office regarding your preparation for the procedure.   I appreciate the opportunity to care for you. Silvano Rusk, MD, [facility]

## 2015-09-23 NOTE — Progress Notes (Signed)
Referred by: Sinda Du, MD  Subjective:    Patient ID: [name] [name], female    DOB: 1951/04/28, 64 y.o.   MRN: [id number] Cc: anemia, diarrhea HPI the patient is here because of recurrent iron-deficiency anemia.I had performed a colonoscopy in 2008 that was negative. She returned with similar numbers and an endoscopy was unrevealing. Celiac screening is negative.Had stopped iron supplementation in the interim since I last saw her 2011 Urgent loose defecation x 4-6 weeks, some urge incontinence. Seems associated with an increase in Metformin.  No bleeding. She does not eat meat or dark green leafy vegetables or iron-containing foods it seems.  Wt Readings from Last 3 Encounters:  09/23/15 131 lb (59.421 kg)  09/06/15 128 lb 12.8 oz (58.423 kg)  07/25/15 129 lb (58.514 kg)    Allergies  Allergen Reactions  . Codeine Other (See Comments)    Took it years ago and doesn't remember the reaction   Outpatient Prescriptions Prior to Visit  Medication Sig Dispense Refill  . atenolol (TENORMIN) 50 MG tablet Take by mouth. Take 50 mg in AM and 25 mg in PM    . benzonatate (TESSALON) 100 MG capsule as needed.     Marland Kitchen BIOTIN PO Take 500 mg by mouth daily.     Marland Kitchen CINNAMON PO Take 2,000 mg by mouth daily.     . clotrimazole-betamethasone (LOTRISONE) cream as needed.     . desoximetasone (TOPICORT) 0.25 % cream as needed.     . dicyclomine (BENTYL) 20 MG tablet Take 20 mg by mouth 3 (three) times daily.     . Iron TABS Take 65 mg by mouth daily.     . metFORMIN (GLUCOPHAGE) 500 MG tablet 1,000 mg 2 (two) times daily with a meal.     . oxazepam (SERAX) 10 MG capsule Take 10 mg by mouth at bedtime as needed.     . sertraline (ZOLOFT) 100 MG tablet Take 1 tablet (100 mg total) by mouth 2 (two) times daily. 60 tablet 2  . WELCHOL 625 MG tablet Taking 6 Tablets daily    . ZETIA 10 MG tablet Take 10 mg by mouth daily.      No facility-administered medications prior to visit.   Past Medical  History  Diagnosis Date  . Anxiety   . Diabetes mellitus type II   . Depression   . MVP (mitral valve prolapse)   . Rectocele   . Constipation 03/23/2013  . Hyperlipemia 02/21/09    NUC STRESS TEST-NORMAL- EF 74%  . Elevated cholesterol   . Iron deficiency anemia    Past Surgical History  Procedure Laterality Date  . Abdominal hysterectomy    . Carpel tunnel Bilateral 12/17/2009  . Bilateral salpingoophorectomy    . Esophagogastroduodenoscopy    . Colonoscopy     Social History   Social History  . Marital Status: Married    Spouse Name: N/A  . Number of Children: N/A  . Years of Education: N/A   Social History Main Topics  . Smoking status: Former Smoker -- 30 years    Types: Cigarettes  . Smokeless tobacco: Current User    Types: Snuff     Comment: call when ready to quit  . Alcohol Use: No  . Drug Use: No  . Sexual Activity: No   Other Topics Concern  . None   Social History Narrative   Married 2 sons   She is a housewife   No caffeine   09/23/2015  Family History  Problem Relation Age of Onset  . Anxiety disorder Mother   . ADD / ADHD Brother   . Alcohol abuse Brother     x 3  . Drug abuse Brother   . Anxiety disorder Maternal Aunt   . Anxiety disorder Maternal Grandmother   . Dementia Neg Hx   . OCD Neg Hx   . Paranoid behavior Neg Hx   . Physical abuse Neg Hx   . Schizophrenia Neg Hx   . Seizures Neg Hx   . Sexual abuse Neg Hx   . Colon cancer Neg Hx   . Colon polyps Neg Hx   . Bipolar disorder Other   . Pancreatic cancer Neg Hx   . Esophageal cancer Neg Hx   . Stomach cancer Neg Hx   . Kidney disease Neg Hx   . Liver disease Neg Hx   . Diabetes Maternal Uncle        Review of Systems Depressed mood, fatigue, all other ROS negative     Objective:   Physical Exam @BP  146/64 mmHg  Pulse 76  Ht 5\' 3"  (1.6 m)  Wt 131 lb (59.421 kg)  BMI 23.21 kg/m2@  General:  Well-developed, well-nourished and in no acute  distress Eyes:  anicteric. ENT:   Mouth and posterior pharynx free of lesions. + dentures Neck:   supple w/o thyromegaly or mass.  Lungs: Clear to auscultation bilaterally. Heart:  S1S2, no rubs, murmurs, gallops. Abdomen:  soft, non-tender, no hepatosplenomegaly, hernia, or mass and BS+.  Rectal: Patti [place], [place] present.  Mild erythema in anoderm NL resting tone Non-tender No mass and no stool  Lymph:  no cervical or supraclavicular adenopathy. Extremities:   no edema, cyanosis or clubbing Skin   no rash. Neuro:  A&O x 3.  Psych:  appropriate mood and  Affect.   Data Reviewed: 2008 colonoscopy normal 2011 EGD erythematous antrum otherwise normal CBC Latest Ref Rng 09/13/2014 03/15/2008 01/09/2008  WBC 4.0 - 10.5 K/uL 5.7 4.8 5.9  Hemoglobin 12.0 - 15.0 g/dL 12.1 12.5 12.1  Hematocrit 36.0 - 46.0 % 35.3(L) 35.8(L) 36.1  Platelets 150 - 400 K/uL 107(L) 160 169    Lab Results  Component Value Date   FERRITIN 26 01/09/2008   TTG antibody -2008 IgA was normal  07/18/2015 hemoglobin 7 MCV 61 platelet count 102 white count 4 hemoglobin A1c 7.5% ferritin 8 folate and B12 normal at greater than 20 and 376    Assessment & Plan:  Diarrhea, unspecified type  Anemia, iron deficiency  Colonoscopy to investigate diarrhea and anemia.she may need additional investigation with Hemoccults and perhaps a capsule endoscopy. Whatever this is seems innocuous unless the diarrhea is a sign of something more troubling in the colon though I suspect it's from the metformin. I suspect whatever is going on lifelong iron supplementation will be necessary. We know thisis not celiac disease.  The risks and benefits as well as alternatives of endoscopic procedure(s) have been discussed and reviewed. All questions answered. The patient agrees to proceed.  I appreciate the opportunity to care for this patient. CC: Alonza Bogus, MD

## 2015-09-28 ENCOUNTER — Ambulatory Visit (AMBULATORY_SURGERY_CENTER): Payer: Commercial Managed Care - HMO | Admitting: Internal Medicine

## 2015-09-28 ENCOUNTER — Encounter: Payer: Self-pay | Admitting: Internal Medicine

## 2015-09-28 VITALS — BP 134/71 | HR 58 | Temp 95.8°F | Resp 27 | Ht 63.0 in | Wt 131.0 lb

## 2015-09-28 DIAGNOSIS — D509 Iron deficiency anemia, unspecified: Secondary | ICD-10-CM | POA: Diagnosis not present

## 2015-09-28 DIAGNOSIS — D124 Benign neoplasm of descending colon: Secondary | ICD-10-CM

## 2015-09-28 DIAGNOSIS — L309 Dermatitis, unspecified: Secondary | ICD-10-CM | POA: Insufficient documentation

## 2015-09-28 DIAGNOSIS — R197 Diarrhea, unspecified: Secondary | ICD-10-CM | POA: Diagnosis present

## 2015-09-28 LAB — GLUCOSE, CAPILLARY
GLUCOSE-CAPILLARY: 123 mg/dL — AB (ref 65–99)
GLUCOSE-CAPILLARY: 101 mg/dL — AB (ref 65–99)

## 2015-09-28 MED ORDER — SODIUM CHLORIDE 0.9 % IV SOLN: 500.0000 mL | INTRAVENOUS | Status: DC

## 2015-09-28 NOTE — Addendum Note (Signed)
Addended by: Randall Hiss B on: 09/28/2015 10:45 AM   Modules accepted: Orders

## 2015-09-28 NOTE — Op Note (Signed)
[facility]  [address] [place], [address]   COLONOSCOPY PROCEDURE REPORT  PATIENT: [name] [name], [name] [name]  MR#: [id number] BIRTHDATE: 07-08-1951 , 64  yrs. old GENDER: female ENDOSCOPIST: Gatha Mayer, MD, [facility] PROCEDURE DATE:  09/28/2015 PROCEDURE:   Colonoscopy, diagnostic, Colonoscopy with biopsy, and Colonoscopy with snare polypectomy First Screening Colonoscopy - Avg.  risk and is 50 yrs.  old or older - No.  Prior Negative Screening - Now for repeat screening. Less than 10 yrs Prior Negative Screening - Now for repeat screening.  Other: See Comments  History of Adenoma - Now for follow-up colonoscopy & has been > or = to 3 yrs.  N/A  Polyps removed today? Yes ASA CLASS:   Class II INDICATIONS:Unexplained iron deficiency anemia, Clinically significant diarrhea of unexplained origin, and Colorectal Neoplasm Risk Assessment for this procedure is average risk. MEDICATIONS: Propofol 160 mg IV and Monitored anesthesia care DESCRIPTION OF PROCEDURE:   After the risks benefits and alternatives of the procedure were thoroughly explained, informed consent was obtained.  The digital rectal exam revealed no abnormalities of the rectum.   The [id number]  endoscope was introduced through the anus and advanced to the terminal ileum which was intubated for a short distance. No adverse events experienced.   The quality of the prep was excellent.  (MiraLax was used)  The instrument was then slowly withdrawn as the colon was fully examined. Estimated blood loss is zero unless otherwise noted in this procedure report.  COLON FINDINGS: Two polypoid shaped polyps ranging from 7 to 2mm in size were found in the descending colon.  Polypectomies were performed using snare cautery (15 mm seni-pedunculated) and with a cold snare (7 mm sessile).  The resection was complete, the polyp tissue was completely retrieved and sent to histology.   The examination was otherwise  normal.   The examined terminal ileum appeared to be normal.  Retroflexed views revealed no abnormalities. The time to cecum = 3.5 Withdrawal time = 12.7   The scope was withdrawn and the procedure completed. COMPLICATIONS: There were no immediate complications.  ENDOSCOPIC IMPRESSION: 1.   Two polyps ranging from 7 to 53mm in size were found in the descending colon; polypectomies were performed using snare cautery and with a cold snare 2.   The examination was otherwise normal - excellent prep 3.   The examined terminal ileum appeared to be normal  RECOMMENDATIONS: 1.  Hold Aspirin and all other NSAIDS for 2 weeks. 2.  Await pathology results 3.  Consider hemoccults once biospy and polypectomy sites heal and if + capsule endoscopy small bowel. 4. Diarrhea better with reduced metformin 5. Calmoseptine for perianal dermatitis  eSigned:  Gatha Mayer, MD, [facility] 09/28/2015 10:58 AM  cc: Velvet Bathe, MD and The Patient

## 2015-09-28 NOTE — Progress Notes (Signed)
Called to room to assist during endoscopic procedure.  Patient ID and intended procedure confirmed with present staff. Received instructions for my participation in the procedure from the performing physician.  

## 2015-09-28 NOTE — Progress Notes (Signed)
A/ox3 pleased with MAC, report to RN 

## 2015-09-28 NOTE — Patient Instructions (Addendum)
I found and removed two polyps. I took biopsies to see if there is microscopic colitis. No cause of anemia seen.  I will let you know results and next steps. Try calmoseptine for the perianal rash.  I appreciate the opportunity to care for you. Gatha Mayer, MD, FACG    YOU HAD AN ENDOSCOPIC PROCEDURE TODAY AT [facility]:   Refer to the procedure report that was given to you for any specific questions about what was found during the examination.  If the procedure report does not answer your questions, please call your gastroenterologist to clarify.  If you requested that your care partner not be given the details of your procedure findings, then the procedure report has been included in a sealed envelope for you to review at your convenience later.  YOU SHOULD EXPECT: Some feelings of bloating in the abdomen. Passage of more gas than usual.  Walking can help get rid of the air that was put into your GI tract during the procedure and reduce the bloating. If you had a lower endoscopy (such as a colonoscopy or flexible sigmoidoscopy) you may notice spotting of blood in your stool or on the toilet paper. If you underwent a bowel prep for your procedure, you may not have a normal bowel movement for a few days.  Please Note:  You might notice some irritation and congestion in your nose or some drainage.  This is from the oxygen used during your procedure.  There is no need for concern and it should clear up in a day or so.  SYMPTOMS TO REPORT IMMEDIATELY:   Following lower endoscopy (colonoscopy or flexible sigmoidoscopy):  Excessive amounts of blood in the stool  Significant tenderness or worsening of abdominal pains  Swelling of the abdomen that is new, acute  Fever of 100F or higher    For urgent or emergent issues, a gastroenterologist can be reached at any hour by calling [phone].   DIET: Your first meal following the procedure should be a small  meal and then it is ok to progress to your normal diet. Heavy or fried foods are harder to digest and may make you feel nauseous or bloated.  Likewise, meals heavy in dairy and vegetables can increase bloating.  Drink plenty of fluids but you should avoid alcoholic beverages for 24 hours.  ACTIVITY:  You should plan to take it easy for the rest of today and you should NOT DRIVE or use heavy machinery until tomorrow (because of the sedation medicines used during the test).    FOLLOW UP: Our staff will call the number listed on your records the next business day following your procedure to check on you and address any questions or concerns that you may have regarding the information given to you following your procedure. If we do not reach you, we will leave a message.  However, if you are feeling well and you are not experiencing any problems, there is no need to return our call.  We will assume that you have returned to your regular daily activities without incident.  If any biopsies were taken you will be contacted by phone or by letter within the next 1-3 weeks.  Please call us at [phone] if you have not heard about the biopsies in 3 weeks.    SIGNATURES/CONFIDENTIALITY: You and/or your care partner have signed paperwork which will be entered into your electronic medical record.  These signatures attest to the fact that  that the information above on your After Visit Summary has been reviewed and is understood.  Full responsibility of the confidentiality of this discharge information lies with you and/or your care-partner.   INFORMATION ON POLYPS GIVEN TO YOU TODAY  HOLD ASPIRIN AND ANTI INFLAMMATORY PRODUCTS FOR 2 WEEKS- TYLENOL OK  CALMOSEPTINE FOR PERIANAL DERMATITIS  MAY CONTINUE IMODIUM 4-6 PER DAY FOR DIARRHEA

## 2015-09-29 ENCOUNTER — Telehealth: Payer: Self-pay | Admitting: *Deleted

## 2015-09-29 NOTE — Telephone Encounter (Signed)
  Follow up Call-  Call back number 09/28/2015  Post procedure Call Back phone  # [phone]  Permission to leave phone message Yes     Patient questions:  Do you have a fever, pain , or abdominal swelling? No. Pain Score  0 *  Have you tolerated food without any problems? Yes.    Have you been able to return to your normal activities? Yes.    Do you have any questions about your discharge instructions: Diet   No. Medications  No. Follow up visit  No.  Do you have questions or concerns about your Care? No.  Actions: * If pain score is 4 or above: No action needed, pain <4.

## 2015-10-05 ENCOUNTER — Other Ambulatory Visit: Payer: Self-pay

## 2015-10-05 ENCOUNTER — Encounter: Payer: Self-pay | Admitting: Internal Medicine

## 2015-10-05 DIAGNOSIS — Z860101 Personal history of adenomatous and serrated colon polyps: Secondary | ICD-10-CM

## 2015-10-05 DIAGNOSIS — D509 Iron deficiency anemia, unspecified: Secondary | ICD-10-CM

## 2015-10-05 DIAGNOSIS — Z86010 Personal history of colonic polyps: Secondary | ICD-10-CM

## 2015-10-05 HISTORY — DX: Personal history of adenomatous and serrated colon polyps: Z86.0101

## 2015-10-05 HISTORY — DX: Personal history of colonic polyps: Z86.010

## 2015-10-06 ENCOUNTER — Encounter (HOSPITAL_COMMUNITY): Payer: Self-pay | Admitting: Psychiatry

## 2015-10-06 ENCOUNTER — Ambulatory Visit (INDEPENDENT_AMBULATORY_CARE_PROVIDER_SITE_OTHER): Payer: 59 | Admitting: Psychiatry

## 2015-10-06 VITALS — BP 138/73 | HR 62 | Ht 63.0 in | Wt 128.4 lb

## 2015-10-06 DIAGNOSIS — F419 Anxiety disorder, unspecified: Secondary | ICD-10-CM | POA: Diagnosis not present

## 2015-10-06 DIAGNOSIS — F331 Major depressive disorder, recurrent, moderate: Secondary | ICD-10-CM

## 2015-10-06 MED ORDER — SERTRALINE HCL 100 MG PO TABS: 100.0000 mg | ORAL_TABLET | Freq: Two times a day (BID) | ORAL | Status: DC

## 2015-10-06 NOTE — Progress Notes (Signed)
Patient ID: [name] [name], female   DOB: 10/27/1951, 64 y.o.   MRN: [id number] Patient ID: [name] [name], female   DOB: 1951/02/14, 64 y.o.   MRN: [id number] Patient ID: [name] [name], female   DOB: November 12, 1951, 64 y.o.   MRN: [id number] Patient ID: [name] [name], female   DOB: 26-Jan-1951, 64 y.o.   MRN: [id number] Patient ID: [name] [name], female   DOB: Aug 30, 1951, 64 y.o.   MRN: [id number] Patient ID: [name] [name], female   DOB: 11-Apr-1951, 64 y.o.   MRN: [id number] Patient ID: [name] [name], female   DOB: 1951/08/25, 64 y.o.   MRN: [id number] Patient ID: [name] [name], female   DOB: Jun 11, 1951, 64 y.o.   MRN: [id number] Patient ID: [name] [name], female   DOB: [birth year], 64 y.o.   MRN: [id number] Patient ID: [name] [name], female   DOB: 12-01-1951, 64 y.o.   MRN: [id number] Patient ID: [name] [name], female   DOB: 1951/04/30, 64 y.o.   MRN: [id number] Patient ID: [name] [name], female   DOB: Mar 03, 1951, 64 y.o.   MRN: [id number] [place] [address] Progress Note [name] [name] MRN: [id number] DOB: 19-Dec-1950 Age: 64 y.o.  Date: 10/06/2015  Chief Complaint: Chief Complaint  Patient presents with  . Depression  . Follow-up   History of present illness Patient is 64 year old married Caucasian female lives with her husband in [place]. 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 She has 2 children and 3 grandchildren.  The patient states she's had anxiety issues for at least 20 years. She has mitral valve prolapse and she claims it "tore up my nerves." She's tried numerous medicines at [facility] as worked the best for her. Currently her anxiety is well controlled her mood is good. She doesn't sleep much at night only gets 3-4 hours of rest. This has been going on for about 5 years and she's not sure why she. She claims she's adjusted to it.  The patient returns after 4 weeks. Last time I gave her Rexulti for augmentation for her Zoloft. She claims she was  depressed and did not much appetite. However she never took it because she claims "I'm already on too much medicine." She starting to eat a little bit better since she had a colonoscopy a and everything was normal and 2 polyps were removed. She also found out that metformin was causing diarrhea and she's been switched to [place],. She's not quite as sad and she thinks is just that time his past since her brother died. She really doesn't want to go on anything else for augmentation and states she's going to go little bit longer on just the Zoloft. She denies suicidal ideation     Current psychiatric medication Zoloft 200 mg daily  Family History family history includes ADD / ADHD in her brother; Alcohol abuse in her brother; Anxiety disorder in her maternal aunt, maternal grandmother, and mother; Bipolar disorder in her other; Diabetes in her maternal uncle; Drug abuse in her brother. There is no history of Dementia, OCD, Paranoid behavior, Physical abuse, Schizophrenia, Seizures, Sexual abuse, Colon cancer, Colon polyps, Pancreatic cancer, Esophageal cancer, Stomach cancer, Kidney disease, or Liver disease.  Medical history Hyperlipidemia, hypertension and diabetes.  Psychosocial history Patient lives with her husband. She has been married for 42 years. She has 2 children lives in [place].  Mental status examination Patient is pleasant cooperative and maintained a good eye contact. She is well-groomed and well-dressed. However she  looks very thin She is appropriate to her stated age. She described her mood as better and her affect is a little brighter She's alert and oriented x3. Her attention and concentration is okay. She denies any active or passive suicidal thinking and homicidal thinking. She denies any auditory or visual hallucination. Her thought process is logical linear and goal-directed. There are no paranoia or delusions present at this time. Her insight judgment and impulse  control is okay.  Lab Results:  Results for orders placed or performed in visit on 09/28/15 (from the past 8736 hour(s))  Glucose, capillary   Collection Time: 09/28/15  9:20 AM  Result Value Ref Range   Glucose-Capillary 123 (H) 65 - 99 mg/dL  Glucose, capillary   Collection Time: 09/28/15 10:53 AM  Result Value Ref Range   Glucose-Capillary 101 (H) 65 - 99 mg/dL   PCP draws routine labs and cholesterol and blood sugar are the primary concerns.   Assessment  Axis I Anxiety disorder Axis II deferred Axis III see medical history Axis IV mild to moderate  Plan/Discussion: Patient will continue Zoloft to 100 mg twice a day for depression.  Risk and benefits of the medication explain.  Recommend to call us back if she is any question or concern if she feels worsening of the symptom.  Followup in 3 months. She declines counseling  Medical Decision Making Problem Points:  Established problem, stable/improving (1), Review of last therapy session (1) and Review of psycho-social stressors (1) Data Points:  Review or order clinical lab tests (1) Review of medication regiment & side effects (2)  I certify that outpatient services furnished can reasonably be expected to improve the patient's condition.   Levonne Spiller, MD

## 2015-10-12 ENCOUNTER — Other Ambulatory Visit: Payer: Self-pay | Admitting: Cardiovascular Disease

## 2015-10-12 ENCOUNTER — Other Ambulatory Visit: Payer: Self-pay

## 2015-10-12 MED ORDER — ATENOLOL 50 MG PO TABS: ORAL_TABLET | ORAL | Status: DC

## 2015-10-12 NOTE — Telephone Encounter (Signed)
Called patient who states she takes atenolol 50mg  QAM and 25mg  QHS. She is not taking 50mg  BID (or an extra 1/2 tablet) for palpitations. Previous instructions were too long to be e-scribed to pharmacy, thus was simplified. Patient scheduled for OV 11/17/15 with Dr. Claiborne Billings   Rx(s) sent to pharmacy electronically.

## 2015-11-17 ENCOUNTER — Ambulatory Visit (INDEPENDENT_AMBULATORY_CARE_PROVIDER_SITE_OTHER): Payer: Commercial Managed Care - HMO | Admitting: Cardiovascular Disease

## 2015-11-17 ENCOUNTER — Encounter: Payer: Self-pay | Admitting: Cardiovascular Disease

## 2015-11-17 VITALS — BP 128/64 | HR 52 | Ht 64.0 in | Wt 130.0 lb

## 2015-11-17 DIAGNOSIS — R002 Palpitations: Secondary | ICD-10-CM | POA: Diagnosis not present

## 2015-11-17 NOTE — Progress Notes (Signed)
Patient ID: ATIANNA HAIDAR, female   DOB: 15-Aug-1951, 64 y.o.   MRN: [id number]     HPI: [name] [name] is a 64 y.o. female who presents to the office today for a one-year cardiology evaluation.  [name] [name] has a history of tachypalpitations which have been controlled with  beta blocker therapy. Remotely, she has a history of hepatosteatorrhea. She has documented mild sclerosis and mild to moderate aortic insufficiency or aortic valve. [name] last echo Doppler study was done in 2012. Laboratory  by [name] [name] in January 2014 showed a glucose of 164. Total cholesterol 249 triglycerides 155 LDL cholesterol 157. She has had myalgias secondary to Lipitor, Crestor, and Zocor. Remotely, she did have mild LFT elevation in the setting of significant hyperglycemia and hepatosteatorrhea.  A two-year followup echo Dopplers in 05/20/2013 showed an ejection fraction of 60-65%. She had grade 1 diastolic dysfunction. Aortic valve mildly calcified annulus. She had mild-to-moderate regurgitation. Was mild aortic sclerosis. There is mild mitral regurgitation.  The palpitations have been improved with the increased dose of atenolol to 50 mg.  She has had significant issues with both depression and anxiety.  2 years ago.  [name] [name] died on Christmas day.  [name] [name] died in last 04-25-23 and another [name] died this year.   she denies episodes of chest pain. She is no longer on metformin and is now on interval, 4.  Diabetes.  She continues to take atenolol 50 mg in the morning and 25 mg at night. She is on Zetia and WelChol for hyperlipidemia.  She presents for evaluation.  Past Medical History  Diagnosis Date  . Anxiety   . Diabetes mellitus type II   . Depression   . MVP (mitral valve prolapse)   . Rectocele   . Constipation 03/23/2013  . Hyperlipemia 02/21/09    NUC STRESS TEST-NORMAL- EF 74%  . Elevated cholesterol   . Iron deficiency anemia   . Hx of adenomatous colonic polyps 10/05/2015    Past  Surgical History  Procedure Laterality Date  . Abdominal hysterectomy    . Carpel tunnel Bilateral 12/17/2009  . Bilateral salpingoophorectomy    . Esophagogastroduodenoscopy    . Colonoscopy      Allergies  Allergen Reactions  . Codeine Other (See Comments)    Took it years ago and doesn't remember the reaction    Current Outpatient Prescriptions  Medication Sig Dispense Refill  . atenolol (TENORMIN) 50 MG tablet Take 1 tablet (11m) by mouth in the morning and 1/2 tablet (216m at night. 45 tablet 2  . benzonatate (TESSALON) 100 MG capsule as needed.     . Marland KitchenIOTIN PO Take 500 mg by mouth daily.     . canagliflozin (INVOKANA) 300 MG TABS tablet Take 300 mg by mouth daily before breakfast.    . CINNAMON PO Take 2,000 mg by mouth daily.     . clotrimazole-betamethasone (LOTRISONE) cream as needed.     . desoximetasone (TOPICORT) 0.25 % cream as needed.     . dicyclomine (BENTYL) 20 MG tablet Take 20 mg by mouth 3 (three) times daily.     . Iron TABS Take 65 mg by mouth daily.     . Marland Kitchenxazepam (SERAX) 10 MG capsule Take 10 mg by mouth at bedtime as needed.     . sertraline (ZOLOFT) 100 MG tablet Take 1 tablet (100 mg total) by mouth 2 (two) times daily. 60 tablet 2  . WELCHOL 625 MG tablet Taking 6 Tablets daily    .  ZETIA 10 MG tablet Take 10 mg by mouth daily.      No current facility-administered medications for this visit.    Socially she is married 67 years. She has 2 children. She completed ninth grade education. There is no alcohol use. There is a history of snuff for tobacco use.   ROS General: Negative; No fevers, chills, or night sweats;  HEENT: Negative; No changes in vision or hearing, sinus congestion, difficulty swallowing Pulmonary: Negative; No cough, wheezing, shortness of breath, hemoptysis Cardiovascular: Negative; No chest pain, presyncope, syncope, palpitations GI: Negative; No nausea, vomiting, diarrhea, or abdominal pain GU: Negative; No dysuria, hematuria,  or difficulty voiding Musculoskeletal: Negative; no myalgias, joint pain, or weakness Hematologic/Oncology: Negative; no easy bruising, bleeding Endocrine: Negative; no heat/cold intolerance; no diabetes Neuro: Negative; no changes in balance, headaches Skin: Negative; No rashes or skin lesions Psychiatric: Positive for depression; No behavioral problems,  Sleep: Negative; No snoring, daytime sleepiness, hypersomnolence, bruxism, restless legs, hypnogognic hallucinations, no cataplexy Other comprehensive 14 point system review is negative.   PE BP 128/64 mmHg  Pulse 52  Ht _0  (1.626 m)  Wt 130 lb (58.968 kg)  BMI 22.30 kg/m2   Wt Readings from Last 3 Encounters:  11/17/15 130 lb (58.968 kg)  10/06/15 128 lb 6.4 oz (58.242 kg)  09/28/15 131 lb (59.421 kg)   General: Alert, oriented, no distress.  Skin: normal turgor, no rashes HEENT: Normocephalic, atraumatic. Pupils round and reactive; sclera anicteric;no lid lag.  Nose without nasal septal hypertrophy Mouth/Parynx benign; Mallinpatti scale 2/3 Neck: No JVD, no carotid bruits with normal carotid Lungs: clear to ausculatation and percussion; no wheezing or rales Heart: RRR, s1 s2 normal 7-8/4 systolic murmur in the aortic region and a 1/6 diastolic murmur compatible with [name] aortic sclerosis and aortic insufficiency. Abdomen: soft, nontender; no hepatosplenomehaly, BS+; abdominal aorta nontender and not dilated by palpation. Pulses 2+ Extremities: no clubbing cyanosis or edema, Homan's sign negative  Neurologic: grossly nonfocal Psychological: Recent exacerbation of depression.  ECG (independently read by me):  Sinus bradycardia 52 bpm.  Nonspecific ST-T changes.  October 2015 ECG (independently read by the): Sinus rhythm, 78 beats per minute.  ST-T changes V1 through V3, which are old.  Prior to July 2014 ECG: Sinus rhythm at 66 beats per minute. They're nondiagnostic T changes precordially.  LABS:  BMP Latest Ref Rng  09/13/2014 01/09/2008  Glucose 70 - 99 mg/dL 146(H) 263(H)  BUN 6 - 23 mg/dL 9 11  Creatinine 0.50 - 1.10 mg/dL 0.73 0.66  Sodium 135 - 145 mEq/L 139 136  Potassium 3.5 - 5.3 mEq/L 4.2 4.3  Chloride 96 - 112 mEq/L 102 103  CO2 19 - 32 mEq/L 28 26  Calcium 8.4 - 10.5 mg/dL 9.8 9.6   Hepatic Function Latest Ref Rng 09/13/2014 01/09/2008 10/22/2007  Total Protein 6.0 - 8.3 g/dL 7.4 8.0 7.9  Albumin 3.5 - 5.2 g/dL 4.2 4.0 4.0  AST 0 - 37 U/L 28 82(H) 82(H)  ALT 0 - 35 U/L 21 74(H) 97(H)  Alk Phosphatase 39 - 117 U/L 52 67 62  Total Bilirubin 0.2 - 1.2 mg/dL 0.4 0.5 0.7  Bilirubin, Direct 0.0-0.3 mg/dL - - 0.1   CBC Latest Ref Rng 09/13/2014 03/15/2008 01/09/2008  WBC 4.0 - 10.5 K/uL 5.7 4.8 5.9  Hemoglobin 12.0 - 15.0 g/dL 12.1 12.5 12.1  Hematocrit 36.0 - 46.0 % 35.3(L) 35.8(L) 36.1  Platelets 150 - 400 K/uL 107(L) 160 169   Lab Results  Component  Value Date   MCV 78.1 09/13/2014   MCV 81.9 03/15/2008   MCV 81.7 01/09/2008   Lab Results  Component Value Date   TSH 2.208 09/13/2014   Lab Results  Component Value Date   HGBA1C 9.3* 09/13/2014   Lipid Panel     Component Value Date/Time   CHOL 224* 09/13/2014 0730   TRIG 136 09/13/2014 0730   HDL 69 09/13/2014 0730   CHOLHDL 3.2 09/13/2014 0730   VLDL 27 09/13/2014 0730   LDLCALC 128* 09/13/2014 0730   RADIOLOGY: No results found.    ASSESSMENT AND PLAN: Ms. Bettendorf is  Is a 64 year old female who has a history of tachycardia palpitations that have been controlled with beta blocker therapy.  Over the past year.  She is tolerated atenolol 50 mg in the morning and 25 mg at night.  [name] ECG today shows sinus bradycardia for which she is asymptomatic. She now is on interval, for [name] diabetes mellitus. She has statin intolerance for hyperlipidemia and has been on combination therapy with both WelChol and Zetia. He does not have established CAD. She continues to have issues with depression and anxiety.  She is followed by Dr. Velvet Bathe in [place], for primary care. She tells me that he had recently checked laboratory. Last year, [name] diabetes was not well controlled.  He has a remote history of hepatis stearrhea and last year follow-up  Liver function studies were improved.  She will continue [name] current medical regimen.  I will see [name] one year for reevaluation.  Time spent 25 minutes  Troy Sine, MD, [facility]  11/17/2015 7:25 PM

## 2015-11-17 NOTE — Patient Instructions (Signed)
Your physician wants you to follow-up in: 6 months or sooner if needed. You will receive a reminder letter in the mail two months in advance. If you don't receive a letter, please call our office to schedule the follow-up appointment.   If you need a refill on your cardiac medications before your next appointment, please call your pharmacy. 

## 2015-11-29 ENCOUNTER — Other Ambulatory Visit: Payer: Self-pay | Admitting: Cardiovascular Disease

## 2015-11-29 NOTE — Telephone Encounter (Signed)
Rx request sent to pharmacy.  

## 2015-12-07 ENCOUNTER — Other Ambulatory Visit: Payer: 59

## 2015-12-07 DIAGNOSIS — D509 Iron deficiency anemia, unspecified: Secondary | ICD-10-CM

## 2015-12-07 LAB — HEMOCCULT SLIDES (X 3 CARDS)
Fecal Occult Blood: NEGATIVE
OCCULT 1: NEGATIVE
OCCULT 2: NEGATIVE
OCCULT 3: NEGATIVE
OCCULT 4: NEGATIVE
OCCULT 5: NEGATIVE

## 2015-12-07 NOTE — Progress Notes (Signed)
Quick Note:  Let her know no blood in stool Needs to stay on ferrous sulfate and f/u PCP  See me prn ______

## 2016-01-04 ENCOUNTER — Ambulatory Visit (HOSPITAL_COMMUNITY): Payer: Self-pay | Admitting: Psychiatry

## 2016-01-16 ENCOUNTER — Ambulatory Visit (INDEPENDENT_AMBULATORY_CARE_PROVIDER_SITE_OTHER): Payer: 59 | Admitting: Psychiatry

## 2016-01-16 ENCOUNTER — Encounter (HOSPITAL_COMMUNITY): Payer: Self-pay | Admitting: Psychiatry

## 2016-01-16 VITALS — BP 120/61 | HR 60 | Ht 64.0 in | Wt 135.4 lb

## 2016-01-16 DIAGNOSIS — F331 Major depressive disorder, recurrent, moderate: Secondary | ICD-10-CM | POA: Diagnosis not present

## 2016-01-16 MED ORDER — SERTRALINE HCL 100 MG PO TABS: 100.0000 mg | ORAL_TABLET | Freq: Two times a day (BID) | ORAL | Status: DC

## 2016-01-16 NOTE — Progress Notes (Signed)
Patient ID: [name] [name], female   DOB: Sep 15, 1951, 65 y.o.   MRN: [id number] Patient ID: [name] [name], female   DOB: [birth year], 65 y.o.   MRN: [id number] Patient ID: [name] [name], female   DOB: November 14, 1951, 65 y.o.   MRN: [id number] Patient ID: [name] [name], female   DOB: May 25, 1951, 65 y.o.   MRN: [id number] Patient ID: [name] [name], female   DOB: [birth year], 65 y.o.   MRN: [id number] Patient ID: [name] [name], female   DOB: 1951/12/01, 65 y.o.   MRN: [id number] Patient ID: [name] [name], female   DOB: 1951-06-18, 65 y.o.   MRN: [id number] Patient ID: [name] [name], female   DOB: [birth year], 65 y.o.   MRN: [id number] Patient ID: [name] [name], female   DOB: 1951/09/26, 65 y.o.   MRN: [id number] Patient ID: [name] [name], female   DOB: 1951/04/06, 65 y.o.   MRN: [id number] Patient ID: [name] [name], female   DOB: 1951/08/23, 65 y.o.   MRN: [id number] Patient ID: [name] [name], female   DOB: 06-26-1951, 65 y.o.   MRN: [id number] Patient ID: [name] [name], female   DOB: 1951-12-11, 65 y.o.   MRN: [id number] [place] [address] Progress Note [name] [name] MRN: [id number] DOB: 02/01/1951 Age: 65 y.o.  Date: 01/16/2016  Chief Complaint: Chief Complaint  Patient presents with  . Depression  . Follow-up   History of present illness Patient is 65 year old married Caucasian female lives with her husband in [place]. 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 She has 2 children and 3 grandchildren.  The patient states she's had anxiety issues for at least 20 years. She has mitral valve prolapse and she claims it "tore up my nerves." She's tried numerous medicines at [facility] as worked the best for her. Currently her anxiety is well controlled her mood is good. She doesn't sleep much at night only gets 3-4 hours of rest. This has been going on for about 5 years and she's not sure why she. She claims she's adjusted to it.  The patient returns after 3 months. She has  had yet another death in the family. Her 40 year old nephew died right before Christmas. He had Crohn's disease and numerous other medical problems. She states this makes for [name] [name] her family in the last year or so. She feels sad a lot but doesn't think she would benefit from any change in medication. She thinks the Zoloft helped and most of it is due to the circumstances. Her primary doctor gave her Serax and she takes this at only on very infrequent occasions when she feels extremely anxious     Current psychiatric medication Zoloft 200 mg daily  Family History family history includes ADD / ADHD in her brother; Alcohol abuse in her brother; Anxiety disorder in her maternal aunt, maternal grandmother, and mother; Bipolar disorder in her other; Diabetes in her maternal uncle; Drug abuse in her brother. There is no history of Dementia, OCD, Paranoid behavior, Physical abuse, Schizophrenia, Seizures, Sexual abuse, Colon cancer, Colon polyps, Pancreatic cancer, Esophageal cancer, Stomach cancer, Kidney disease, or Liver disease.  Medical history Hyperlipidemia, hypertension and diabetes.  Psychosocial history Patient lives with her husband. She has been married for 42 years. She has 2 children lives in [place].  Mental status examination Patient is pleasant cooperative and maintained a good eye contact. She is well-groomed and well-dressed. However she looks very thin She is appropriate to her stated age. She described her  mood as sad and her affect is a little constricted but she was able to smile and laugh at times She's alert and oriented x3. Her attention and concentration is okay. She denies any active or passive suicidal thinking and homicidal thinking. She denies any auditory or visual hallucination. Her thought process is logical linear and goal-directed. There are no paranoia or delusions present at this time. Her insight judgment and impulse control is okay.  Lab Results:  Results  for orders placed or performed in visit on 12/07/15 (from the past 8736 hour(s))  Hemoccult Cards (X3 cards)   Collection Time: 12/07/15  2:31 PM  Result Value Ref Range   OCCULT 1 Negative Negative   OCCULT 2 Negative Negative   OCCULT 3 Negative Negative   OCCULT 4 Negative Negative   OCCULT 5 Negative Negative   Fecal Occult Blood Negative Negative  Results for orders placed or performed in visit on 09/28/15 (from the past 8736 hour(s))  Glucose, capillary   Collection Time: 09/28/15  9:20 AM  Result Value Ref Range   Glucose-Capillary 123 (H) 65 - 99 mg/dL  Glucose, capillary   Collection Time: 09/28/15 10:53 AM  Result Value Ref Range   Glucose-Capillary 101 (H) 65 - 99 mg/dL   PCP draws routine labs and cholesterol and blood sugar are the primary concerns.   Assessment  Axis I Anxiety disorder Axis II deferred Axis III see medical history Axis IV mild to moderate  Plan/Discussion: Patient will continue Zoloft to 100 mg twice a day for depression.  Risk and benefits of the medication explain.  Recommend to call us back if she is any question or concern if she feels worsening of the symptom.  Followup in 3 months. She declines counseling  Medical Decision Making Problem Points:  Established problem, stable/improving (1), Review of last therapy session (1) and Review of psycho-social stressors (1) Data Points:  Review or order clinical lab tests (1) Review of medication regiment & side effects (2)  I certify that outpatient services furnished can reasonably be expected to improve the patient's condition.   Levonne Spiller, MD

## 2016-01-18 ENCOUNTER — Encounter: Payer: Self-pay | Admitting: Internal Medicine

## 2016-01-22 ENCOUNTER — Other Ambulatory Visit: Payer: Self-pay | Admitting: Cardiovascular Disease

## 2016-01-23 NOTE — Telephone Encounter (Signed)
Rx request sent to pharmacy.  

## 2016-04-10 ENCOUNTER — Ambulatory Visit (INDEPENDENT_AMBULATORY_CARE_PROVIDER_SITE_OTHER): Payer: 59 | Admitting: Psychiatry

## 2016-04-10 ENCOUNTER — Encounter (HOSPITAL_COMMUNITY): Payer: Self-pay | Admitting: Psychiatry

## 2016-04-10 VITALS — BP 131/67 | HR 58 | Ht 64.0 in | Wt 130.6 lb

## 2016-04-10 DIAGNOSIS — F411 Generalized anxiety disorder: Secondary | ICD-10-CM

## 2016-04-10 DIAGNOSIS — F331 Major depressive disorder, recurrent, moderate: Secondary | ICD-10-CM | POA: Diagnosis not present

## 2016-04-10 MED ORDER — SERTRALINE HCL 100 MG PO TABS: 100.0000 mg | ORAL_TABLET | Freq: Two times a day (BID) | ORAL | Status: DC

## 2016-04-10 NOTE — Progress Notes (Signed)
Patient ID: [name] [name], female   DOB: 10-19-1951, 65 y.o.   MRN: [id number] Patient ID: [name] [name], female   DOB: Jul 30, 1951, 65 y.o.   MRN: [id number] Patient ID: [name] [name], female   DOB: August 11, 1951, 65 y.o.   MRN: [id number] Patient ID: [name] [name], female   DOB: Feb 15, 1951, 64 y.o.   MRN: [id number] Patient ID: [name] [name], female   DOB: Dec 30, 1950, 65 y.o.   MRN: [id number] Patient ID: [name] [name], female   DOB: [birth year], 65 y.o.   MRN: [id number] Patient ID: [name] [name], female   DOB: May 02, 1951, 65 y.o.   MRN: [id number] Patient ID: [name] [name], female   DOB: 1951-09-29, 65 y.o.   MRN: [id number] Patient ID: [name] [name], female   DOB: 24-Feb-1951, 65 y.o.   MRN: [id number] Patient ID: [name] [name], female   DOB: 1951-12-09, 65 y.o.   MRN: [id number] Patient ID: [name] [name], female   DOB: Oct 03, 1951, 64 y.o.   MRN: [id number] Patient ID: [name] [name], female   DOB: 1951-04-03, 65 y.o.   MRN: [id number] Patient ID: [name] [name], female   DOB: [birth year], 65 y.o.   MRN: [id number] Patient ID: [name] [name], female   DOB: [birth year], 66 y.o.   MRN: [id number] [name] [id number] Progress Note [name] [name] MRN: [id number] DOB: Jul 13, 1951 Age: 65 y.o.  Date: 04/10/2016  Chief Complaint: Chief Complaint  Patient presents with  . Depression  . Follow-up   History of present illness Patient is 65 year old married Caucasian female lives with her husband in [place]. 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 She has 2 children and 3 grandchildren.  The patient states she's had anxiety issues for at least 20 years. She has mitral valve prolapse and she claims it "tore up my nerves." She's tried numerous medicines at [facility] as worked the best for her. Currently her anxiety is well controlled her mood is good. She doesn't sleep much at night only gets 3-4 hours of rest. This has been going on for about 5 years and she's not sure why she.  She claims she's adjusted to it.  The patient returns after 3 months. She she states that she starting to come out of her depression. She thinks it's from going through 4 deaths in her family in 2 years. Recently her mood is been better most of the time and she staying busy. She recently had 2 of her grandchildren visiting. She denies suicidal ideation. She still not sleeping that well and I suggested adding melatonin 5-10 mg     Current psychiatric medication Zoloft 200 mg daily  Family History family history includes ADD / ADHD in her brother; Alcohol abuse in her brother; Anxiety disorder in her maternal aunt, maternal grandmother, and mother; Bipolar disorder in her other; Diabetes in her maternal uncle; Drug abuse in her brother. There is no history of Dementia, OCD, Paranoid behavior, Physical abuse, Schizophrenia, Seizures, Sexual abuse, Colon cancer, Colon polyps, Pancreatic cancer, Esophageal cancer, Stomach cancer, Kidney disease, or Liver disease.  Medical history Hyperlipidemia, hypertension and diabetes.  Psychosocial history Patient lives with her husband. She has been married for 42 years. She has 2 children lives in [place].  Mental status examination Patient is pleasant cooperative and maintained a good eye contact. She is well-groomed and well-dressed. She is appropriate to her stated age. She described her mood as improved and her affect is a little constricted but she was able  to smile and laugh at times She's alert and oriented x3. Her attention and concentration is okay. She denies any active or passive suicidal thinking and homicidal thinking. She denies any auditory or visual hallucination. Her thought process is logical linear and goal-directed. There are no paranoia or delusions present at this time. Her insight judgment and impulse control is okay.  Lab Results:  Results for orders placed or performed in visit on 12/07/15 (from the past 8736 hour(s))   Hemoccult Cards (X3 cards)   Collection Time: 12/07/15  2:31 PM  Result Value Ref Range   OCCULT 1 Negative Negative   OCCULT 2 Negative Negative   OCCULT 3 Negative Negative   OCCULT 4 Negative Negative   OCCULT 5 Negative Negative   Fecal Occult Blood Negative Negative  Results for orders placed or performed in visit on 09/28/15 (from the past 8736 hour(s))  Glucose, capillary   Collection Time: 09/28/15  9:20 AM  Result Value Ref Range   Glucose-Capillary 123 (H) 65 - 99 mg/dL  Glucose, capillary   Collection Time: 09/28/15 10:53 AM  Result Value Ref Range   Glucose-Capillary 101 (H) 65 - 99 mg/dL   PCP draws routine labs and cholesterol and blood sugar are the primary concerns.   Assessment  Axis I Anxiety disorder Axis II deferred Axis III see medical history Axis IV mild to moderate  Plan/Discussion: Patient will continue Zoloft to 100 mg twice a day for depression.She can add melatonin as needed for insomnia  Risk and benefits of the medication explain.  Recommend to call us back if she is any question or concern if she feels worsening of the symptom.  Followup in 4 months. She declines counseling  Medical Decision Making Problem Points:  Established problem, stable/improving (1), Review of last therapy session (1) and Review of psycho-social stressors (1) Data Points:  Review or order clinical lab tests (1) Review of medication regiment & side effects (2)  I certify that outpatient services furnished can reasonably be expected to improve the patient's condition.   Levonne Spiller, MD

## 2016-05-13 ENCOUNTER — Other Ambulatory Visit: Payer: Self-pay | Admitting: Cardiovascular Disease

## 2016-05-17 ENCOUNTER — Other Ambulatory Visit: Payer: Self-pay | Admitting: Cardiovascular Disease

## 2016-05-29 ENCOUNTER — Ambulatory Visit (INDEPENDENT_AMBULATORY_CARE_PROVIDER_SITE_OTHER): Payer: Commercial Managed Care - HMO | Admitting: Cardiovascular Disease

## 2016-05-29 ENCOUNTER — Encounter: Payer: Self-pay | Admitting: Cardiovascular Disease

## 2016-05-29 VITALS — BP 108/56 | HR 56 | Ht 63.0 in | Wt 134.0 lb

## 2016-05-29 DIAGNOSIS — F411 Generalized anxiety disorder: Secondary | ICD-10-CM | POA: Diagnosis not present

## 2016-05-29 DIAGNOSIS — E119 Type 2 diabetes mellitus without complications: Secondary | ICD-10-CM | POA: Diagnosis not present

## 2016-05-29 DIAGNOSIS — E785 Hyperlipidemia, unspecified: Secondary | ICD-10-CM | POA: Diagnosis not present

## 2016-05-29 DIAGNOSIS — R002 Palpitations: Secondary | ICD-10-CM | POA: Diagnosis not present

## 2016-05-29 DIAGNOSIS — E782 Mixed hyperlipidemia: Secondary | ICD-10-CM | POA: Insufficient documentation

## 2016-05-29 NOTE — Patient Instructions (Signed)
Your physician wants you to follow-up in: 1 year or sooner if needed. You will receive a reminder letter in the mail two months in advance. If you don't receive a letter, please call our office to schedule the follow-up appointment.   If you need a refill on your cardiac medications before your next appointment, please call your pharmacy.   

## 2016-05-29 NOTE — Progress Notes (Signed)
Patient ID: [name] [name], female   DOB: 18-Nov-1951, 65 y.o.   MRN: [id number]     HPI: [name] [name] is a 65 y.o. female who presents to the office today for a 7 month cardiology evaluation.  Ms. Sweeting has a history of tachypalpitations which have been controlled with  beta blocker therapy. Remotely, she has a history of hepatosteatorrhea. She has documented mild sclerosis and mild to moderate aortic insufficiency or aortic valve. Her last echo Doppler study was done in 23-Jan-2011. Laboratory  by Dr. Luan Pulling in 01/23/2013 showed a glucose of 164. Total cholesterol 249 triglycerides 155 LDL cholesterol 157. She has had myalgias secondary to Lipitor, Crestor, and Zocor. Remotely, she did have mild LFT elevation in the setting of significant hyperglycemia and hepatosteatorrhea.  A two-year followup echo Dopplers in 05/20/2013 showed an ejection fraction of 60-65%. She had grade 1 diastolic dysfunction. Aortic valve mildly calcified annulus. She had mild-to-moderate regurgitation. Was mild aortic sclerosis. There is mild mitral regurgitation.  The palpitations have been improved with the increased dose of atenolol to 50 mg.  She has had significant issues with both depression and anxiety.  Over the past 2 years her mother died on Christmas day.  Her brother died in last 23-Apr-2023 and another brother also died in Jan 23, 2015.  She denies episodes of chest pain.  She is unaware of any recurrent episodes of palpitations or tachycardia.  She continues to take atenolol 50 mg in the morning and 25 mg in the evening.  She is statin intolerant and has been taking WelChol 6 tablets daily and Zetia 10 mg daily for hyperlipidemia.  She is taking invokana 300 mg daily for her diabetes.  She continues to take Zoloft 100 mg twice a day for her anxiety and depression   She presents for evaluation.  Past Medical History  Diagnosis Date  . Anxiety   . Diabetes mellitus type II   . Depression   . MVP (mitral valve prolapse)    . Rectocele   . Constipation 03/23/2013  . Hyperlipemia 02/21/09    NUC STRESS TEST-NORMAL- EF 74%  . Elevated cholesterol   . Iron deficiency anemia   . Hx of adenomatous colonic polyps 10/05/2015    Past Surgical History  Procedure Laterality Date  . Abdominal hysterectomy    . Carpel tunnel Bilateral 12/17/2009  . Bilateral salpingoophorectomy    . Esophagogastroduodenoscopy    . Colonoscopy      Allergies  Allergen Reactions  . Codeine Other (See Comments)    Took it years ago and doesn't remember the reaction    Current Outpatient Prescriptions  Medication Sig Dispense Refill  . atenolol (TENORMIN) 50 MG tablet TAKE 1 TABLET IN THE MORNING AND 1/2 TABLET IN THE EVENING 135 tablet 1  . benzonatate (TESSALON) 100 MG capsule as needed.     Marland Kitchen BIOTIN PO Take 500 mg by mouth daily.     . canagliflozin (INVOKANA) 300 MG TABS tablet Take 300 mg by mouth daily before breakfast.    . CINNAMON PO Take 2,000 mg by mouth daily.     . clotrimazole-betamethasone (LOTRISONE) cream as needed.     . desoximetasone (TOPICORT) 0.25 % cream as needed.     . dicyclomine (BENTYL) 20 MG tablet Take 20 mg by mouth 3 (three) times daily.     . Iron TABS Take 65 mg by mouth daily.     Marland Kitchen oxazepam (SERAX) 10 MG capsule Take 10 mg by mouth  at bedtime as needed.     . sertraline (ZOLOFT) 100 MG tablet Take 1 tablet (100 mg total) by mouth 2 (two) times daily. 60 tablet 3  . WELCHOL 625 MG tablet Taking 6 Tablets daily    . ZETIA 10 MG tablet Take 10 mg by mouth daily.      No current facility-administered medications for this visit.    Socially she is married 66 years. She has 2 children. She completed ninth grade education. There is no alcohol use. There is a history of snuff for tobacco use.   ROS General: Negative; No fevers, chills, or night sweats;  HEENT: Negative; No changes in vision or hearing, sinus congestion, difficulty swallowing Pulmonary: Negative; No cough, wheezing, shortness  of breath, hemoptysis Cardiovascular: Negative; No chest pain, presyncope, syncope, palpitations GI: Negative; No nausea, vomiting, diarrhea, or abdominal pain GU: Negative; No dysuria, hematuria, or difficulty voiding Musculoskeletal: Negative; no myalgias, joint pain, or weakness Hematologic/Oncology: Negative; no easy bruising, bleeding Endocrine: Negative; no heat/cold intolerance; no diabetes Neuro: Negative; no changes in balance, headaches Skin: Negative; No rashes or skin lesions Psychiatric: Positive for depression; No behavioral problems,  Sleep: Negative; No snoring, daytime sleepiness, hypersomnolence, bruxism, restless legs, hypnogognic hallucinations, no cataplexy Other comprehensive 14 point system review is negative.   PE BP 108/56 mmHg  Pulse 56  Ht 5' 3" (1.6 m)  Wt 134 lb (60.782 kg)  BMI 23.74 kg/m2   Repeat blood pressure by me was 128/66.  Wt Readings from Last 3 Encounters:  05/29/16 134 lb (60.782 kg)  04/10/16 130 lb 9.6 oz (59.24 kg)  01/16/16 135 lb 6.4 oz (61.417 kg)   General: Alert, oriented, no distress.  Skin: normal turgor, no rashes HEENT: Normocephalic, atraumatic. Pupils round and reactive; sclera anicteric;no lid lag.  Nose without nasal septal hypertrophy Mouth/Parynx benign; Mallinpatti scale 2/3 Neck: No JVD, no carotid bruits with normal carotid Lungs: clear to ausculatation and percussion; no wheezing or rales Heart: RRR, s1 s2 normal 2-9/9 systolic murmur in the aortic region and a 1/6 diastolic murmur compatible with her aortic sclerosis and aortic insufficiency. Abdomen: soft, nontender; no hepatosplenomehaly, BS+; abdominal aorta nontender and not dilated by palpation. Pulses 2+ Extremities: no clubbing cyanosis or edema, Homan's sign negative  Neurologic: grossly nonfocal Psychological: Recent exacerbation of siituational depression after the deaths of 3 of her family members  ECG (independently read by me): Sinus bradycardia 57  bpm.  Nonspecific ST-T changes.  December 2016 ECG (independently read by me):  Sinus bradycardia 52 bpm.  Nonspecific ST-T changes.  October 2015 ECG (independently read by the): Sinus rhythm, 78 beats per minute.  ST-T changes V1 through V3, which are old.  Prior to July 2014 ECG: Sinus rhythm at 66 beats per minute. They're nondiagnostic T changes precordially.  LABS:  BMP Latest Ref Rng 09/13/2014 01/09/2008  Glucose 70 - 99 mg/dL 146(H) 263(H)  BUN 6 - 23 mg/dL 9 11  Creatinine 0.50 - 1.10 mg/dL 0.73 0.66  Sodium 135 - 145 mEq/L 139 136  Potassium 3.5 - 5.3 mEq/L 4.2 4.3  Chloride 96 - 112 mEq/L 102 103  CO2 19 - 32 mEq/L 28 26  Calcium 8.4 - 10.5 mg/dL 9.8 9.6   Hepatic Function Latest Ref Rng 09/13/2014 01/09/2008 10/22/2007  Total Protein 6.0 - 8.3 g/dL 7.4 8.0 7.9  Albumin 3.5 - 5.2 g/dL 4.2 4.0 4.0  AST 0 - 37 U/L 28 82(H) 82(H)  ALT 0 - 35 U/L 21 74(H) 97(H)  Alk Phosphatase 39 - 117 U/L 52 67 62  Total Bilirubin 0.2 - 1.2 mg/dL 0.4 0.5 0.7  Bilirubin, Direct 0.0-0.3 mg/dL - - 0.1   CBC Latest Ref Rng 09/13/2014 03/15/2008 01/09/2008  WBC 4.0 - 10.5 K/uL 5.7 4.8 5.9  Hemoglobin 12.0 - 15.0 g/dL 12.1 12.5 12.1  Hematocrit 36.0 - 46.0 % 35.3(L) 35.8(L) 36.1  Platelets 150 - 400 K/uL 107(L) 160 169   Lab Results  Component Value Date   MCV 78.1 09/13/2014   MCV 81.9 03/15/2008   MCV 81.7 01/09/2008   Lab Results  Component Value Date   TSH 2.208 09/13/2014   Lab Results  Component Value Date   HGBA1C 9.3* 09/13/2014   Lipid Panel     Component Value Date/Time   CHOL 224* 09/13/2014 0730   TRIG 136 09/13/2014 0730   HDL 69 09/13/2014 0730   CHOLHDL 3.2 09/13/2014 0730   VLDL 27 09/13/2014 0730   LDLCALC 128* 09/13/2014 0730   RADIOLOGY: No results found.    ASSESSMENT AND PLAN: Ms. Parks is  Is a 65 year old female who has a history of tachypalpitations that have been controlled with beta blocker therapy. She is tolerating atenolol 50 mg in the  morning and 25 mg at night.  Her ECG today shows sinus bradycardia for which she is asymptomatic. She is on invokana for her diabetes mellitus. She has statin intolerance for hyperlipidemia and has been on combination therapy with both WelChol and Zetia.  She has a history of hepatis stearrhea with previous elevation of her liver function studies.  She does not have established CAD. She continues to have issues with depression and anxiety.  She is followed by Dr. Velvet Bathe in [place], for primary care.  Dr. Luan Pulling.  Will be checking laboratory.  She continues to have issues with depression but this has been improved with Zoloft.  She has remained active and has been planting a lot of flowers in her garden.  She will continue current therapy as prescribed.  I will see her in one year for reevaluation.    Troy Sine, MD, [facility]  05/29/2016 5:24 PM

## 2016-08-07 ENCOUNTER — Ambulatory Visit (INDEPENDENT_AMBULATORY_CARE_PROVIDER_SITE_OTHER): Payer: 59 | Admitting: Psychiatry

## 2016-08-07 ENCOUNTER — Encounter (HOSPITAL_COMMUNITY): Payer: Self-pay | Admitting: Psychiatry

## 2016-08-07 VITALS — BP 133/61 | HR 56 | Ht 63.0 in | Wt 130.6 lb

## 2016-08-07 DIAGNOSIS — F331 Major depressive disorder, recurrent, moderate: Secondary | ICD-10-CM | POA: Diagnosis not present

## 2016-08-07 MED ORDER — SERTRALINE HCL 100 MG PO TABS: 100.0000 mg | ORAL_TABLET | Freq: Two times a day (BID) | ORAL | 3 refills | Status: DC

## 2016-08-07 MED ORDER — BREXPIPRAZOLE 1 MG PO TABS: 1.0000 mg | ORAL_TABLET | Freq: Every day | ORAL | 2 refills | Status: DC

## 2016-08-07 NOTE — Progress Notes (Signed)
Patient ID: [name] [name], female   DOB: 02/21/1951, 65 y.o.   MRN: [id number] Patient ID: [name] [name], female   DOB: [birth year], 65 y.o.   MRN: [id number] Patient ID: [name] [name], female   DOB: 05-May-1951, 65 y.o.   MRN: [id number] Patient ID: [name] [name], female   DOB: 1951/06/24, 65 y.o.   MRN: [id number] Patient ID: [name] [name], female   DOB: 05/09/1951, 65 y.o.   MRN: [id number] Patient ID: [name] [name], female   DOB: 1951-12-06, 65 y.o.   MRN: [id number] Patient ID: [name] [name], female   DOB: [birth year], 65 y.o.   MRN: [id number] Patient ID: [name] [name], female   DOB: February 03, 1951, 65 y.o.   MRN: [id number] Patient ID: [name] [name], female   DOB: Nov 18, 1951, 65 y.o.   MRN: [id number] Patient ID: [name] [name], female   DOB: 11-May-1951, 65 y.o.   MRN: [id number] Patient ID: [name] [name], female   DOB: [birth year], 65 y.o.   MRN: [id number] Patient ID: [name] [name], female   DOB: 1951-04-14, 65 y.o.   MRN: [id number] Patient ID: [name] [name], female   DOB: 02-05-1951, 65 y.o.   MRN: [id number] Patient ID: [name] [name], female   DOB: 10-Apr-1951, 65 y.o.   MRN: [id number] [place] [id number] Progress Note [name] [name] MRN: [id number] DOB: 1951/11/06 Age: 65 y.o.  Date: 08/07/2016  Chief Complaint: Chief Complaint  Patient presents with  . Depression  . Anxiety  . Follow-up   History of present illness Patient is 65 year old married Caucasian female lives with her husband in [place]. 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 She has 2 children and 3 grandchildren.  The patient states she's had anxiety issues for at least 20 years. She has mitral valve prolapse and she claims it "tore up my nerves." She's tried numerous medicines at [facility]axil as worked the best for her. Currently her anxiety is well controlled her mood is good. She doesn't sleep much at night only gets 3-4 hours of rest. This has been going on for about 5 years and she's not  sure why she. She claims she's adjusted to it.  The patient returns after 3 months. She she states that she tries to stay busy but still feels depressed. She's never gotten over the deaths of several family members. Last year I given her Rexulti samples for augmentation and she never use them. She is coming to the conclusion that she needs to try something to boost up the Zoloft. Her energy is low and she doesn't feel like doing a lot. She denies suicidal ideation.     Current psychiatric medication Zoloft 200 mg daily  Family History family history includes ADD / ADHD in her brother; Alcohol abuse in her brother; Anxiety disorder in her maternal aunt, maternal grandmother, and mother; Bipolar disorder in her other; Diabetes in her maternal uncle; Drug abuse in her brother.  Medical history Hyperlipidemia, hypertension and diabetes.  Psychosocial history Patient lives with her husband. She has been married for 42 years. She has 2 children lives in [place].  Mental status examination Patient is pleasant cooperative and maintained a good eye contact. She is well-groomed and well-dressed. She is appropriate to her stated age. She described her mood as low and her affect is a little constricted  She's alert and oriented x3. Her attention and concentration is okay. She denies any active or passive suicidal thinking and homicidal thinking. She denies any  auditory or visual hallucination. Her thought process is logical linear and goal-directed. There are no paranoia or delusions present at this time. Her insight judgment and impulse control is okay.  Lab Results:  Results for orders placed or performed in visit on 12/07/15 (from the past 8736 hour(s))  Hemoccult Cards (X3 cards)   Collection Time: 12/07/15  2:31 PM  Result Value Ref Range   OCCULT 1 Negative Negative   OCCULT 2 Negative Negative   OCCULT 3 Negative Negative   OCCULT 4 Negative Negative   OCCULT 5 Negative Negative    Fecal Occult Blood Negative Negative  Results for orders placed or performed in visit on 09/28/15 (from the past 8736 hour(s))  Glucose, capillary   Collection Time: 09/28/15  9:20 AM  Result Value Ref Range   Glucose-Capillary 123 (H) 65 - 99 mg/dL  Glucose, capillary   Collection Time: 09/28/15 10:53 AM  Result Value Ref Range   Glucose-Capillary 101 (H) 65 - 99 mg/dL   PCP draws routine labs and cholesterol and blood sugar are the primary concerns.   Assessment  Axis I Anxiety disorder Axis II deferred Axis III see medical history Axis IV mild to moderate  Plan/Discussion: Patient will continue Zoloft to 100 mg twice a day for depression. She was given Rexulti samples to begin with 0.5 mg daily and then advance to 1 mg daily after one week Risk and benefits of the medication explain.  Recommend to call us back if she is any question or concern if she feels worsening of the symptom.  Followup in 4 weeks. She declines counseling  Medical Decision Making Problem Points:  Established problem, stable/improving (1), Review of last therapy session (1) and Review of psycho-social stressors (1) Data Points:  Review or order clinical lab tests (1) Review of medication regiment & side effects (2)  I certify that outpatient services furnished can reasonably be expected to improve the patient's condition.   Levonne Spiller, MD

## 2016-08-13 ENCOUNTER — Telehealth (HOSPITAL_COMMUNITY): Payer: Self-pay | Admitting: *Deleted

## 2016-08-13 NOTE — Telephone Encounter (Signed)
Pt called stating she would like to inform Dr. Harrington Challenger that she will not be taking her Rexulti that she was changed to. Per pt, after she read all of the side effects, she have decided to keep taking the Zoloft. Per pt, she did not start the [place]. Per pt, since she did not take the [place], she would like to resch her appt she have with Dr. Harrington Challenger in 4 wks. Pt was rescheduled and informed note will be sent to provider. Pt verbalized understanding .

## 2016-08-13 NOTE — Telephone Encounter (Signed)
noted 

## 2016-09-05 ENCOUNTER — Ambulatory Visit (HOSPITAL_COMMUNITY): Payer: Self-pay | Admitting: Psychiatry

## 2016-09-25 ENCOUNTER — Encounter (HOSPITAL_COMMUNITY): Payer: Self-pay | Admitting: Psychiatry

## 2016-09-25 ENCOUNTER — Ambulatory Visit (INDEPENDENT_AMBULATORY_CARE_PROVIDER_SITE_OTHER): Payer: 59 | Admitting: Psychiatry

## 2016-09-25 VITALS — BP 140/65 | HR 63 | Ht 63.0 in | Wt 130.6 lb

## 2016-09-25 DIAGNOSIS — F419 Anxiety disorder, unspecified: Secondary | ICD-10-CM

## 2016-09-25 DIAGNOSIS — F331 Major depressive disorder, recurrent, moderate: Secondary | ICD-10-CM

## 2016-09-25 MED ORDER — SERTRALINE HCL 100 MG PO TABS: 100.0000 mg | ORAL_TABLET | Freq: Two times a day (BID) | ORAL | 3 refills | Status: DC

## 2016-09-25 NOTE — Progress Notes (Signed)
Patient ID: [name] [name], female   DOB: May 10, 1951, 65 y.o.   MRN: [id number] Patient ID: [name] [name], female   DOB: Aug 10, 1951, 65 y.o.   MRN: [id number] Patient ID: [name] [name], female   DOB: 02-Mar-1951, 65 y.o.   MRN: [id number] Patient ID: [name] [name], female   DOB: 1951/03/18, 65 y.o.   MRN: [id number] Patient ID: [name] [name], female   DOB: Dec 01, 1951, 65 y.o.   MRN: [id number] Patient ID: [name] [name], female   DOB: 13-Jan-1951, 65 y.o.   MRN: [id number] Patient ID: [name] [name], female   DOB: 04/08/1951, 65 y.o.   MRN: [id number] Patient ID: [name] [name], female   DOB: 08/02/1951, 65 y.o.   MRN: [id number] Patient ID: [name] [name], female   DOB: 07-10-1951, 65 y.o.   MRN: [id number] Patient ID: [name] [name], female   DOB: [birth year], 65 y.o.   MRN: [id number] Patient ID: [name] [name], female   DOB: 30-Dec-1950, 65 y.o.   MRN: [id number] Patient ID: [name] [name], female   DOB: 1951/05/15, 65 y.o.   MRN: [id number] Patient ID: [name] [name], female   DOB: February 07, 1951, 65 y.o.   MRN: [id number] Patient ID: [name] [name], female   DOB: February 13, 1951, 65 y.o.   MRN: [id number] [place] [id number] Progress Note [name] [name] MRN: [id number] DOB: 11-Jul-1951 Age: 65 y.o.  Date: 09/25/2016  Chief Complaint: Chief Complaint  Patient presents with  . Depression  . Follow-up   History of present illness Patient is 65 year old married Caucasian female lives with her husband in [place]. 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 She has 2 children and 3 grandchildren.  The patient states she's had anxiety issues for at least 20 years. She has mitral valve prolapse and she claims it "tore up my nerves." She's tried numerous medicines at [facility] as worked the best for her. Currently her anxiety is well controlled her mood is good. She doesn't sleep much at night only gets 3-4 hours of rest. This has been going on for about 5 years and she's not sure why  she. She claims she's adjusted to it.  The patient returns after 6 weeks. Last time I gave her rexulti samples to try along with the Zoloft because she claimed she was still depressed. She was afraid to try them because of potential side effects. She states that she is doing a little bit better on her own. She is staying busy. She sleeping fairly well. She denies any suicidal ideation. She states that she has good and bad days and the bad days happen when she thinks about her deceased family members. She still may try either XLT and she has the samples and she will let me know if she does try them and they help.     Current psychiatric medication Zoloft 200 mg daily  Family History family history includes ADD / ADHD in her brother; Alcohol abuse in her brother; Anxiety disorder in her maternal aunt, maternal grandmother, and mother; Bipolar disorder in her other; Diabetes in her maternal uncle; Drug abuse in her brother.  Medical history Hyperlipidemia, hypertension and diabetes.  Psychosocial history Patient lives with her husband. She has been married for 42 years. She has 2 children lives in [place].  Mental status examination Patient is pleasant cooperative and maintained a good eye contact. She is well-groomed and well-dressed. She is appropriate to her stated age. She described her mood as good today and  her affect is a little brighter She's alert and oriented x3. Her attention and concentration is okay. She denies any active or passive suicidal thinking and homicidal thinking. She denies any auditory or visual hallucination. Her thought process is logical linear and goal-directed. There are no paranoia or delusions present at this time. Her insight judgment and impulse control is okay.  Lab Results:  Results for orders placed or performed in visit on 12/07/15 (from the past 8736 hour(s))  Hemoccult Cards (X3 cards)   Collection Time: 12/07/15  2:31 PM  Result Value Ref Range    OCCULT 1 Negative Negative   OCCULT 2 Negative Negative   OCCULT 3 Negative Negative   OCCULT 4 Negative Negative   OCCULT 5 Negative Negative   Fecal Occult Blood Negative Negative  Results for orders placed or performed in visit on 09/28/15 (from the past 8736 hour(s))  Glucose, capillary   Collection Time: 09/28/15  9:20 AM  Result Value Ref Range   Glucose-Capillary 123 (H) 65 - 99 mg/dL  Glucose, capillary   Collection Time: 09/28/15 10:53 AM  Result Value Ref Range   Glucose-Capillary 101 (H) 65 - 99 mg/dL   PCP draws routine labs and cholesterol and blood sugar are the primary concerns.   Assessment  Axis I Anxiety disorder Axis II deferred Axis III see medical history Axis IV mild to moderate  Plan/Discussion: Patient will continue Zoloft to 100 mg twice a day for depression. She still has Rexulti samples to begin with 0.5 mg daily and then advance to 1 mg daily after one week Risk and benefits of the medication explain.  Recommend to call us back if she is any question or concern if she feels worsening of the symptom.  Followup in 3 months. She declines counseling  Medical Decision Making Problem Points:  Established problem, stable/improving (1), Review of last therapy session (1) and Review of psycho-social stressors (1) Data Points:  Review or order clinical lab tests (1) Review of medication regiment & side effects (2)  I certify that outpatient services furnished can reasonably be expected to improve the patient's condition.   Levonne Spiller, MD

## 2016-10-08 LAB — TSH: TSH: 2 (ref <=5.90)

## 2016-10-21 ENCOUNTER — Other Ambulatory Visit: Payer: Self-pay | Admitting: Cardiovascular Disease

## 2016-10-22 NOTE — Telephone Encounter (Signed)
Rx request sent to pharmacy.  

## 2016-11-12 ENCOUNTER — Other Ambulatory Visit: Payer: Self-pay | Admitting: Cardiovascular Disease

## 2016-11-12 NOTE — Telephone Encounter (Signed)
REFILL 

## 2016-12-19 ENCOUNTER — Other Ambulatory Visit (HOSPITAL_COMMUNITY): Payer: Self-pay | Admitting: Psychiatry

## 2016-12-19 ENCOUNTER — Telehealth (HOSPITAL_COMMUNITY): Payer: Self-pay | Admitting: *Deleted

## 2016-12-19 MED ORDER — BREXPIPRAZOLE 2 MG PO TABS: 2.0000 mg | ORAL_TABLET | Freq: Every day | ORAL | 2 refills | Status: DC

## 2016-12-19 NOTE — Telephone Encounter (Signed)
Called pt to resch appt for 12-26-16 due to provider out of office that morning. lmtcb and office number provided on voicemail.

## 2016-12-19 NOTE — Telephone Encounter (Signed)
Sent to pharmacy 

## 2016-12-19 NOTE — Telephone Encounter (Signed)
Called pt and lmtcb due to previous phone call

## 2016-12-19 NOTE — Progress Notes (Unsigned)
rexulti 

## 2016-12-19 NOTE — Telephone Encounter (Signed)
phone call from patient. had samples of Rexulti 2 mg.  She has been on them almost three weeks.  she only have one more week left.    She said she is doing better and would like prescription.   She would like generic if they come in generic.

## 2016-12-20 ENCOUNTER — Telehealth (HOSPITAL_COMMUNITY): Payer: Self-pay | Admitting: *Deleted

## 2016-12-20 ENCOUNTER — Encounter (HOSPITAL_COMMUNITY): Payer: Self-pay | Admitting: Psychiatry

## 2016-12-20 NOTE — Telephone Encounter (Signed)
phone call again to contact patient regarding provider out of office 12/26/16.   Female answered and said she is not home.

## 2016-12-26 ENCOUNTER — Ambulatory Visit (HOSPITAL_COMMUNITY): Payer: Self-pay | Admitting: Psychiatry

## 2017-01-02 ENCOUNTER — Ambulatory Visit (HOSPITAL_COMMUNITY): Payer: Self-pay | Admitting: Psychiatry

## 2017-01-07 ENCOUNTER — Ambulatory Visit (HOSPITAL_COMMUNITY): Payer: Self-pay | Admitting: Psychiatry

## 2017-01-15 ENCOUNTER — Ambulatory Visit (INDEPENDENT_AMBULATORY_CARE_PROVIDER_SITE_OTHER): Payer: 59 | Admitting: Psychiatry

## 2017-01-15 ENCOUNTER — Encounter (HOSPITAL_COMMUNITY): Payer: Self-pay | Admitting: Psychiatry

## 2017-01-15 VITALS — BP 133/67 | HR 63 | Ht 63.0 in | Wt 131.8 lb

## 2017-01-15 DIAGNOSIS — F411 Generalized anxiety disorder: Secondary | ICD-10-CM

## 2017-01-15 DIAGNOSIS — F331 Major depressive disorder, recurrent, moderate: Secondary | ICD-10-CM | POA: Diagnosis not present

## 2017-01-15 MED ORDER — SERTRALINE HCL 100 MG PO TABS: 100.0000 mg | ORAL_TABLET | Freq: Two times a day (BID) | ORAL | 3 refills | Status: DC

## 2017-01-15 MED ORDER — BREXPIPRAZOLE 2 MG PO TABS: 2.0000 mg | ORAL_TABLET | Freq: Every day | ORAL | 2 refills | Status: DC

## 2017-01-15 NOTE — Progress Notes (Signed)
Patient ID: [name] [name], female   DOB: 02-12-1951, 66 y.o.   MRN: [id number] Patient ID: [name] [name], female   DOB: [birth year], 66 y.o.   MRN: [id number] Patient ID: [name] [name], female   DOB: 1950-12-23, 66 y.o.   MRN: [id number] Patient ID: [name] [name], female   DOB: [birth year], 66 y.o.   MRN: [id number] Patient ID: [name] [name], female   DOB: Jan 19, 1951, 66 y.o.   MRN: [id number] Patient ID: [name] [name], female   DOB: Jan 24, 1951, 66 y.o.   MRN: [id number] Patient ID: [name] [name], female   DOB: 04-Nov-1951, 66 y.o.   MRN: [id number] Patient ID: [name] [name], female   DOB: 1951-04-21, 66 y.o.   MRN: [id number] Patient ID: [name] [name], female   DOB: 12-31-1950, 66 y.o.   MRN: [id number] Patient ID: [name] [name], female   DOB: 20-Jan-1951, 66 y.o.   MRN: [id number] Patient ID: [name] [name], female   DOB: 12/14/1951, 66 y.o.   MRN: [id number] Patient ID: [name] [name], female   DOB: Oct 28, 1951, 66 y.o.   MRN: [id number] Patient ID: [name] [name], female   DOB: May 13, 1951, 66 y.o.   MRN: [id number] Patient ID: [name] [name], female   DOB: Feb 06, 1951, 66 y.o.   MRN: [id number] [place] [id number] Progress Note [name] [name] MRN: [id number] DOB: 1951/08/24 Age: 66 y.o.  Date: 01/15/2017  Chief Complaint: Chief Complaint  Patient presents with  . Depression  . Follow-up   History of present illness Patient is 66 year old married Caucasian female lives with her husband in [place]. 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 She has 2 children and 3 grandchildren.  The patient states she's had anxiety issues for at least 20 years. She has mitral valve prolapse and she claims it "tore up my nerves." She's tried numerous medicines at [facility] as worked the best for her. Currently her anxiety is well controlled her mood is good. She doesn't sleep much at night only gets 3-4 hours of rest. This has been going on for about 5 years and she's not sure why she.  She claims she's adjusted to it.  The patient returns after 3 months. Last time we added Rexulti to her Zoloft and she thinks it's helped a little bit. She still misses her family who have died over the last couple years. She does enjoy spending time with her children and grandchildren. Her energy is still somewhat low and she is reluctant to go out in crowds because of her irritable bowel. She is worried that she will have a diarrhea episode. I suggested she go back to GI since there are a lot of new medications that she can try for this. She denies being suicidal and in general her mood is stable     Current psychiatric medication Zoloft 200 mg daily  Family History family history includes ADD / ADHD in her brother; Alcohol abuse in her brother; Anxiety disorder in her maternal aunt, maternal grandmother, and mother; Bipolar disorder in her other; Diabetes in her maternal uncle; Drug abuse in her brother.  Medical history Hyperlipidemia, hypertension and diabetes.  Psychosocial history Patient lives with her husband. She has been married for 42 years. She has 2 children lives in [place].  Mental status examination Patient is pleasant cooperative and maintained a good eye contact. She is well-groomed and well-dressed. She is appropriate to her stated age. She described her mood as good today and her affect is  fairly bright She's alert and oriented x3. Her attention and concentration is okay. She denies any active or passive suicidal thinking and homicidal thinking. She denies any auditory or visual hallucination. Her thought process is logical linear and goal-directed. There are no paranoia or delusions present at this time. Her insight judgment and impulse control is okay.  Lab Results:  No results found for this or any previous visit (from the past 8736 hour(s)). PCP draws routine labs and cholesterol and blood sugar are the primary concerns.   Assessment  Axis I Anxiety  disorder Axis II deferred Axis III see medical history Axis IV mild to moderate  Plan/Discussion: Patient will continue Zoloft to 100 mg twice a day for depression. She will continue Rexulti 2 milligrams daily for augmentation  Recommend to call us back if she is any question or concern if she feels worsening of the symptom.  Followup in 3 months. She declines counseling  Medical Decision Making Problem Points:  Established problem, stable/improving (1), Review of last therapy session (1) and Review of psycho-social stressors (1) Data Points:  Review or order clinical lab tests (1) Review of medication regiment & side effects (2)  I certify that outpatient services furnished can reasonably be expected to improve the patient's condition.   Levonne Spiller, MD

## 2017-02-21 LAB — HEMOGLOBIN A1C: HEMOGLOBIN A1C: 9.7

## 2017-04-02 ENCOUNTER — Ambulatory Visit (INDEPENDENT_AMBULATORY_CARE_PROVIDER_SITE_OTHER): Payer: 59 | Admitting: Psychiatry

## 2017-04-02 ENCOUNTER — Encounter (HOSPITAL_COMMUNITY): Payer: Self-pay | Admitting: Psychiatry

## 2017-04-02 VITALS — BP 124/60 | HR 62 | Ht 63.0 in | Wt 132.0 lb

## 2017-04-02 DIAGNOSIS — F419 Anxiety disorder, unspecified: Secondary | ICD-10-CM | POA: Diagnosis not present

## 2017-04-02 DIAGNOSIS — F331 Major depressive disorder, recurrent, moderate: Secondary | ICD-10-CM

## 2017-04-02 MED ORDER — BREXPIPRAZOLE 2 MG PO TABS: 2.0000 mg | ORAL_TABLET | Freq: Every day | ORAL | 2 refills | Status: DC

## 2017-04-02 MED ORDER — SERTRALINE HCL 100 MG PO TABS: 100.0000 mg | ORAL_TABLET | Freq: Two times a day (BID) | ORAL | 3 refills | Status: DC

## 2017-04-02 NOTE — Progress Notes (Signed)
Patient ID: [name] [name], female   DOB: [birth year], 66 y.o.   MRN: [id number] Patient ID: [name] [name], female   DOB: [birth year], 66 y.o.   MRN: [id number] Patient ID: [name] [name], female   DOB: 25-Apr-1951, 66 y.o.   MRN: [id number] Patient ID: [name] [name], female   DOB: [birth year], 66 y.o.   MRN: [id number] Patient ID: [name] [name], female   DOB: 1951/05/23, 66 y.o.   MRN: [id number] Patient ID: [name] [name], female   DOB: 06/17/1951, 66 y.o.   MRN: [id number] Patient ID: [name] [name], female   DOB: 1951-06-16, 66 y.o.   MRN: [id number] Patient ID: [name] [name], female   DOB: 06/11/1951, 66 y.o.   MRN: [id number] Patient ID: [name] [name], female   DOB: 1951-05-22, 66 y.o.   MRN: [id number] Patient ID: [name] [name], female   DOB: 06/05/1951, 66 y.o.   MRN: [id number] Patient ID: [name] [name], female   DOB: 1951-10-08, 66 y.o.   MRN: [id number] Patient ID: [name] [name], female   DOB: Apr 30, 1951, 66 y.o.   MRN: [id number] Patient ID: [name] [name], female   DOB: February 16, 1951, 66 y.o.   MRN: [id number] Patient ID: [name] [name], female   DOB: 1951-02-04, 66 y.o.   MRN: [id number] [place] [address] Progress Note [name] [name] MRN: [id number] DOB: 07/30/1951 Age: 66 y.o.  Date: 04/02/2017  Chief Complaint: Chief Complaint  Patient presents with  . Depression  . Anxiety  . Follow-up   History of present illness Patient is 66 year old married Caucasian female lives with her husband in [place]. 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 She has 2 children and 3 grandchildren.  The patient states she's had anxiety issues for at least 20 years. She has mitral valve prolapse and she claims it "tore up my nerves." She's tried numerous medicines at [facility] as worked the best for her. Currently her anxiety is well controlled her mood is good. She doesn't sleep much at night only gets 3-4 hours of rest. This has been going on for about 5 years and she's not  sure why she. She claims she's adjusted to it.  The patient returns after 3 months. Her mood is pretty good and she is staying busy. She is still not sleeping well and missed her sleep study because her son was ill. She doesn't want to try any medication for sleep because she is "already on too much medicine." I suggested she try melatonin. She takes a nap in the afternoons and this may be coming into her nighttime sleep. She does think the addition of Rexulti has helped her mood. She denies suicidal ideation     Current psychiatric medication Zoloft 200 mg daily  Family History family history includes ADD / ADHD in her brother; Alcohol abuse in her brother; Anxiety disorder in her maternal aunt, maternal grandmother, and mother; Bipolar disorder in her other; Diabetes in her maternal uncle; Drug abuse in her brother.  Medical history Hyperlipidemia, hypertension and diabetes.  Psychosocial history Patient lives with her husband. She has been married for 42 years. She has 2 children lives in [place].  Mental status examination Patient is pleasant cooperative and maintained a good eye contact. She is well-groomed and well-dressed. She is appropriate to her stated age. She described her mood as good  and her affect is fairly bright She's alert and oriented x3. Her attention and concentration is okay. She denies any active or passive  suicidal thinking and homicidal thinking. She denies any auditory or visual hallucination. Her thought process is logical linear and goal-directed. There are no paranoia or delusions present at this time. Her insight judgment and impulse control is okay.  Lab Results:  No results found for this or any previous visit (from the past 8736 hour(s)). PCP draws routine labs and cholesterol and blood sugar are the primary concerns.   Assessment  Axis I Anxiety disorder Axis II deferred Axis III see medical history Axis IV mild to  moderate  Plan/Discussion: Patient will continue Zoloft to 100 mg twice a day for depression. She will continue Rexulti 2 milligrams daily for augmentation  Recommend to call us back if she is any question or concern if she feels worsening of the symptom.  Followup in 4 months. She declines counseling  Medical Decision Making Problem Points:  Established problem, stable/improving (1), Review of last therapy session (1) and Review of psycho-social stressors (1) Data Points:  Review or order clinical lab tests (1) Review of medication regiment & side effects (2)  I certify that outpatient services furnished can reasonably be expected to improve the patient's condition.   Levonne Spiller, MD

## 2017-06-11 ENCOUNTER — Ambulatory Visit (INDEPENDENT_AMBULATORY_CARE_PROVIDER_SITE_OTHER): Payer: 59 | Admitting: Internal Medicine

## 2017-06-11 ENCOUNTER — Encounter (INDEPENDENT_AMBULATORY_CARE_PROVIDER_SITE_OTHER): Payer: Self-pay

## 2017-06-11 ENCOUNTER — Encounter: Payer: Self-pay | Admitting: Internal Medicine

## 2017-06-11 VITALS — BP 118/58 | HR 60 | Ht 63.0 in | Wt 133.0 lb

## 2017-06-11 DIAGNOSIS — R1033 Periumbilical pain: Secondary | ICD-10-CM

## 2017-06-11 NOTE — Patient Instructions (Addendum)
  Hold your Januvia per Dr Carlean Purl and call us in a week with an update on how your doing.    I appreciate the opportunity to care for you. Silvano Rusk, MD, [facility]

## 2017-06-11 NOTE — Progress Notes (Signed)
[name] [name] 66 y.o. 1951-03-01 [id number]  Assessment & Plan:   Encounter Diagnosis  Name Primary?  . Periumbilical abdominal pain Yes    1. Abdominal pain - Could be side effect of Januvia. Advised patient to try stopping it for 1 week to see if abdominal pain resolves, and to call us about whether or not that worked. Discussed the possibility of higher blood sugar as a result of this. Instructed to call with any questions or concerns.   I have personally seen the patient, reviewed and repeated key elements of the history and physical and participated in formation of the assessment and plan the student has documented. Gatha Mayer, MD, Maxwell Marion, MD   Subjective:   Chief Complaint:  HPI [name] [name] is a 66 year old female who presents with abdominal pain, which "feels like it's knotted up" x 1 month. She associates this with recent emotional stress from 4 deaths in the family. Her family physician prescribed her dicyclomine 20mg  tid about a year ago which reportedly worked at first but lately has not been working as well.   She also recently started taking Zoloft 100mg  and Rexulti 2mg  for anxiety and depression, which she reports has been helping.   Of note, she is T2 diabetic and is taking Invokana and Januvia.  Denies fever, chills, weight loss, diarrhea/constipation, melena. Does report some occasional bloating. Denies dysuria, but does report increase in urinary frequency ever since she started taking Invokana. She feels this is not a new problem, but a recurrence of a problem she's had in the past.   Wt Readings from Last 3 Encounters:  06/11/17 133 lb (60.3 kg)  04/02/17 132 lb (59.9 kg)  01/15/17 131 lb 12.8 oz (59.8 kg)    Allergies  Allergen Reactions  . Codeine Other (See Comments)    Took it years ago and doesn't remember the reaction   Current Meds  Medication Sig  . atenolol (TENORMIN) 50 MG tablet Take by mouth. Taking 50 mg in Am  and 25 mg in PM  . benzonatate (TESSALON) 100 MG capsule as needed.   Marland Kitchen BIOTIN PO Take 500 mg by mouth daily.   . Brexpiprazole (REXULTI) 2 MG TABS Take 2 mg by mouth daily.  Marland Kitchen CINNAMON PO Take 2,000 mg by mouth daily.   . clotrimazole-betamethasone (LOTRISONE) cream as needed.   . desoximetasone (TOPICORT) 0.25 % cream as needed.   . dicyclomine (BENTYL) 20 MG tablet Take 20 mg by mouth 3 (three) times daily.   . sertraline (ZOLOFT) 100 MG tablet Take 1 tablet (100 mg total) by mouth 2 (two) times daily.  Marland Kitchen SITagliptin Phosphate (JANUVIA PO) Take by mouth.  [name] [name] 625 MG tablet Taking 6 Tablets daily  . ZETIA 10 MG tablet Take 10 mg by mouth daily.    Past Medical History:  Diagnosis Date  . Anxiety   . Constipation 03/23/2013  . Depression   . Diabetes mellitus type II   . Elevated cholesterol   . Hx of adenomatous colonic polyps 10/05/2015  . Hyperlipemia 02/21/09   NUC STRESS TEST-NORMAL- EF 74%  . Iron deficiency anemia   . MVP (mitral valve prolapse)   . Rectocele    Past Surgical History:  Procedure Laterality Date  . ABDOMINAL HYSTERECTOMY    . BILATERAL SALPINGOOPHORECTOMY    . carpel tunnel Bilateral 12/17/2009  . COLONOSCOPY    . ESOPHAGOGASTRODUODENOSCOPY     Social History   Social History  .  Marital status: Married    Spouse name: N/A  . Number of children: N/A  . Years of education: N/A   Occupational History  . Not on file.   Social History Main Topics  . Smoking status: Former Smoker    Years: 30.00    Types: Cigarettes  . Smokeless tobacco: Current User    Types: Snuff     Comment: call when ready to quit  . Alcohol use No  . Drug use: No  . Sexual activity: No   Other Topics Concern  . Not on file   Social History Narrative   Married 2 sons   She is a housewife   No caffeine   09/23/2015      family history includes ADD / ADHD in her brother; Alcohol abuse in her brother; Anxiety disorder in her maternal aunt, maternal grandmother,  and mother; Bipolar disorder in her other; Diabetes in her maternal uncle; Drug abuse in her brother.   Review of Systems Noted in HPI. Otherwise negative.   Objective:   Physical Exam @BP  (!) 118/58 (BP Location: Left Arm, Patient Position: Sitting, Cuff Size: Normal)   Pulse 60   Ht 5\' 3"  (1.6 m)   Wt 133 lb (60.3 kg)   BMI 23.56 kg/m @  General:  Well-developed, well-nourished and in no acute distress Eyes:  anicteric. Neck:   supple w/o thyromegaly or mass.  Lungs: Clear to auscultation bilaterally. Heart:  S1S2, no rubs, gallops. Blowing systolic murmur noted. Consistent with history.  Abdomen:  soft, non-tender, no hepatosplenomegaly, hernia, or mass and BS+.  Rectal: Not done Lymph:  no cervical or supraclavicular adenopathy. Extremities:   no edema, cyanosis or clubbing Skin   no rash. Neuro:  A&O x 3.  Psych:  appropriate mood and  Affect.   Data Reviewed: Prior notes reviewed

## 2017-06-21 ENCOUNTER — Telehealth: Payer: Self-pay | Admitting: Internal Medicine

## 2017-06-21 NOTE — Telephone Encounter (Signed)
Holding [name] [name] has not helped her symptoms.  Please advise

## 2017-06-23 NOTE — Telephone Encounter (Signed)
Resume januvia  I recommend an EGD re periumbilical and epigastric pain  Can offer an 0730 if needed

## 2017-06-24 NOTE — Telephone Encounter (Signed)
Patient notified of the recommendations She will come in for a pre-visit on Wed and EGD 07/03/17 7:30

## 2017-06-26 ENCOUNTER — Ambulatory Visit (AMBULATORY_SURGERY_CENTER): Payer: Self-pay

## 2017-06-26 VITALS — Ht 62.5 in | Wt 136.4 lb

## 2017-06-26 DIAGNOSIS — R1013 Epigastric pain: Secondary | ICD-10-CM

## 2017-06-26 NOTE — Progress Notes (Signed)
No allergies to eggs or soy No diet meds No home oxygen No past problems with anesthesia  Declined emmi 

## 2017-06-28 LAB — HEMOGLOBIN A1C: Hemoglobin A1C: 10.4

## 2017-07-03 ENCOUNTER — Ambulatory Visit (AMBULATORY_SURGERY_CENTER): Payer: 59 | Admitting: Internal Medicine

## 2017-07-03 VITALS — BP 104/53 | HR 72 | Temp 98.4°F | Resp 13 | Ht 62.0 in | Wt 136.0 lb

## 2017-07-03 DIAGNOSIS — K2950 Unspecified chronic gastritis without bleeding: Secondary | ICD-10-CM | POA: Diagnosis not present

## 2017-07-03 DIAGNOSIS — G8929 Other chronic pain: Secondary | ICD-10-CM | POA: Diagnosis not present

## 2017-07-03 DIAGNOSIS — B9681 Helicobacter pylori [H. pylori] as the cause of diseases classified elsewhere: Secondary | ICD-10-CM

## 2017-07-03 DIAGNOSIS — R1013 Epigastric pain: Secondary | ICD-10-CM | POA: Diagnosis not present

## 2017-07-03 DIAGNOSIS — K2970 Gastritis, unspecified, without bleeding: Secondary | ICD-10-CM | POA: Diagnosis not present

## 2017-07-03 MED ORDER — SODIUM CHLORIDE 0.9 % IV SOLN: 500.0000 mL | INTRAVENOUS | Status: DC

## 2017-07-03 NOTE — Progress Notes (Signed)
Called to room to assist during endoscopic procedure.  Patient ID and intended procedure confirmed with present staff. Received instructions for my participation in the procedure from the performing physician.  

## 2017-07-03 NOTE — Progress Notes (Signed)
A/ox3 pleased with MAC, report to Sheila RN 

## 2017-07-03 NOTE — Op Note (Signed)
[place] Patient Name: [name] [name] Procedure Date: 07/03/2017 7:28 AM MRN: [id number] Endoscopist: Gatha Mayer , MD Age: 66 Referring MD:  Date of Birth: [birth year] Gender: Female Account #: [id number] Procedure:                Upper GI endoscopy Indications:              Epigastric abdominal pain Medicines:                Propofol per Anesthesia, Monitored Anesthesia Care Procedure:                Pre-Anesthesia Assessment:                           - Prior to the procedure, a History and Physical                            was performed, and patient medications and                            allergies were reviewed. The patient's tolerance of                            previous anesthesia was also reviewed. The risks                            and benefits of the procedure and the sedation                            options and risks were discussed with the patient.                            All questions were answered, and informed consent                            was obtained. Prior Anticoagulants: The patient has                            taken no previous anticoagulant or antiplatelet                            agents. ASA Grade Assessment: II - A patient with                            mild systemic disease. After reviewing the risks                            and benefits, the patient was deemed in                            satisfactory condition to undergo the procedure.                           After obtaining informed consent, the endoscope was  passed under direct vision. Throughout the                            procedure, the patient's blood pressure, pulse, and                            oxygen saturations were monitored continuously. The                            Endoscope was introduced through the mouth, and                            advanced to the second part of duodenum. The upper                            GI  endoscopy was accomplished without difficulty.                            The patient tolerated the procedure well. Scope In: Scope Out: Findings:                 Diffuse moderate mucosal changes characterized by                            congestion, friability (with contact bleeding),                            smoothness and altered texture were found in the                            entire examined stomach, antrum minimally involved                            mainly body and proximal. Biopsies were taken with                            a cold forceps for histology. Verification of                            patient identification for the specimen was done.                            Estimated blood loss was minimal.                           The exam was otherwise without abnormality.                           The cardia and gastric fundus were otherwise normal                            on retroflexion. Complications:            No immediate complications. Estimated Blood Loss:     Estimated blood loss was minimal. Impression:               -  Congested, friable (with contact bleeding),                            smooth and texture changed mucosa in the stomach,                            especially proximal. Biopsied.                           - The examination was otherwise normal. Recommendation:           - Patient has a contact number available for                            emergencies. The signs and symptoms of potential                            delayed complications were discussed with the                            patient. Return to normal activities tomorrow.                            Written discharge instructions were provided to the                            patient.                           - Resume previous diet.                           - Continue present medications.                           - Await pathology results. Gatha Mayer, MD 07/03/2017 7:55:21  AM This report has been signed electronically.

## 2017-07-03 NOTE — Progress Notes (Signed)
Pt's states no medical or surgical changes since previsit or office visit. 

## 2017-07-03 NOTE — Patient Instructions (Addendum)
The stomach lining looks a bit irritated - not sure why - I took biopsies.  Will let you know by next week.  Hang in there.  I appreciate the opportunity to care for you. Gatha Mayer, MD, FACG    YOU HAD AN ENDOSCOPIC PROCEDURE TODAY AT [facility]:   Refer to the procedure report that was given to you for any specific questions about what was found during the examination.  If the procedure report does not answer your questions, please call your gastroenterologist to clarify.  If you requested that your care partner not be given the details of your procedure findings, then the procedure report has been included in a sealed envelope for you to review at your convenience later.  YOU SHOULD EXPECT: Some feelings of bloating in the abdomen. Passage of more gas than usual.  Walking can help get rid of the air that was put into your GI tract during the procedure and reduce the bloating. If you had a lower endoscopy (such as a colonoscopy or flexible sigmoidoscopy) you may notice spotting of blood in your stool or on the toilet paper. If you underwent a bowel prep for your procedure, you may not have a normal bowel movement for a few days.  Please Note:  You might notice some irritation and congestion in your nose or some drainage.  This is from the oxygen used during your procedure.  There is no need for concern and it should clear up in a day or so.  SYMPTOMS TO REPORT IMMEDIATELY:     Following upper endoscopy (EGD)  Vomiting of blood or coffee ground material  New chest pain or pain under the shoulder blades  Painful or persistently difficult swallowing  New shortness of breath  Fever of 100F or higher  Black, tarry-looking stools  For urgent or emergent issues, a gastroenterologist can be reached at any hour by calling [phone].   DIET:  We do recommend a small meal at first, but then you may proceed to your regular diet.  Drink plenty of fluids but  you should avoid alcoholic beverages for 24 hours.  ACTIVITY:  You should plan to take it easy for the rest of today and you should NOT DRIVE or use heavy machinery until tomorrow (because of the sedation medicines used during the test).    FOLLOW UP: Our staff will call the number listed on your records the next business day following your procedure to check on you and address any questions or concerns that you may have regarding the information given to you following your procedure. If we do not reach you, we will leave a message.  However, if you are feeling well and you are not experiencing any problems, there is no need to return our call.  We will assume that you have returned to your regular daily activities without incident.  If any biopsies were taken you will be contacted by phone or by letter within the next 1-3 weeks.  Please call us at [phone] if you have not heard about the biopsies in 3 weeks.    SIGNATURES/CONFIDENTIALITY: You and/or your care partner have signed paperwork which will be entered into your electronic medical record.  These signatures attest to the fact that that the information above on your After Visit Summary has been reviewed and is understood.  Full responsibility of the confidentiality of this discharge information lies with you and/or your care-partner.   Resume medications. Information  given on Gastritis.

## 2017-07-04 ENCOUNTER — Telehealth: Payer: Self-pay

## 2017-07-04 NOTE — Telephone Encounter (Signed)
  Follow up Call-  Call back number 07/03/2017 09/28/2015  Post procedure Call Back phone  # [phone]  Permission to leave phone message Yes Yes  Some recent data might be hidden     Patient questions:  Do you have a fever, pain , or abdominal swelling? No. Pain Score  0 *  Have you tolerated food without any problems? Yes.    Have you been able to return to your normal activities? Yes.    Do you have any questions about your discharge instructions: Diet   No. Medications  No. Follow up visit  No.  Do you have questions or concerns about your Care? No.  Actions: * If pain score is 4 or above: No action needed, pain <4.

## 2017-07-10 ENCOUNTER — Other Ambulatory Visit: Payer: Self-pay

## 2017-07-10 ENCOUNTER — Encounter: Payer: Self-pay | Admitting: Internal Medicine

## 2017-07-10 DIAGNOSIS — A048 Other specified bacterial intestinal infections: Secondary | ICD-10-CM

## 2017-07-10 DIAGNOSIS — K2970 Gastritis, unspecified, without bleeding: Secondary | ICD-10-CM

## 2017-07-10 DIAGNOSIS — B9681 Helicobacter pylori [H. pylori] as the cause of diseases classified elsewhere: Secondary | ICD-10-CM

## 2017-07-10 HISTORY — DX: Helicobacter pylori (H. pylori) as the cause of diseases classified elsewhere: B96.81

## 2017-07-10 MED ORDER — DOXYCYCLINE HYCLATE 100 MG PO CAPS: 100.0000 mg | ORAL_CAPSULE | Freq: Two times a day (BID) | ORAL | 0 refills | Status: AC

## 2017-07-10 MED ORDER — BISMUTH SUBSALICYLATE 262 MG PO CHEW: 524.0000 mg | CHEWABLE_TABLET | Freq: Four times a day (QID) | ORAL | 0 refills | Status: AC

## 2017-07-10 MED ORDER — METRONIDAZOLE 250 MG PO TABS: 250.0000 mg | ORAL_TABLET | Freq: Four times a day (QID) | ORAL | 0 refills | Status: AC

## 2017-07-10 MED ORDER — OMEPRAZOLE 20 MG PO CPDR: 20.0000 mg | DELAYED_RELEASE_CAPSULE | Freq: Two times a day (BID) | ORAL | 0 refills | Status: DC

## 2017-07-10 NOTE — Progress Notes (Signed)
Please call patient and let her know we found H pylori gastritis  1) Omeprazole 20 mg 2 times a day x 14 d 2) Pepto Bismol 2 tabs (262 mg each) 4 times a day x 14 d 3) Metronidazole 250 mg 4 times a day x 14 d 4) doxycycline 100 mg 2 times a day x 14 d  After 14 d stop omeprazole also  In 4 weeks after treatment completed do H. Pylori stool antigen - dx H. Pylori gastritis   [facility] no letter or recall needed but cc her PCP

## 2017-07-29 ENCOUNTER — Encounter (HOSPITAL_COMMUNITY): Payer: Self-pay | Admitting: Psychiatry

## 2017-07-29 ENCOUNTER — Ambulatory Visit (INDEPENDENT_AMBULATORY_CARE_PROVIDER_SITE_OTHER): Payer: 59 | Admitting: Psychiatry

## 2017-07-29 VITALS — BP 123/58 | HR 61 | Ht 62.0 in | Wt 131.4 lb

## 2017-07-29 DIAGNOSIS — F331 Major depressive disorder, recurrent, moderate: Secondary | ICD-10-CM | POA: Diagnosis not present

## 2017-07-29 DIAGNOSIS — I341 Nonrheumatic mitral (valve) prolapse: Secondary | ICD-10-CM | POA: Diagnosis not present

## 2017-07-29 DIAGNOSIS — Z818 Family history of other mental and behavioral disorders: Secondary | ICD-10-CM | POA: Diagnosis not present

## 2017-07-29 DIAGNOSIS — Z811 Family history of alcohol abuse and dependence: Secondary | ICD-10-CM | POA: Diagnosis not present

## 2017-07-29 MED ORDER — BREXPIPRAZOLE 2 MG PO TABS: 2.0000 mg | ORAL_TABLET | Freq: Every day | ORAL | 2 refills | Status: DC

## 2017-07-29 MED ORDER — SERTRALINE HCL 100 MG PO TABS: 100.0000 mg | ORAL_TABLET | Freq: Two times a day (BID) | ORAL | 3 refills | Status: DC

## 2017-07-29 NOTE — Progress Notes (Signed)
Patient ID: [name] [name], female   DOB: February 19, 1951, 66 y.o.   MRN: [id number] Patient ID: [name] [name], female   DOB: 08/22/1951, 66 y.o.   MRN: [id number] Patient ID: [name] [name], female   DOB: 1951/06/27, 66 y.o.   MRN: [id number] Patient ID: [name] [name], female   DOB: 25-Sep-1951, 66 y.o.   MRN: [id number] Patient ID: [name] [name], female   DOB: December 27, 1950, 66 y.o.   MRN: [id number] Patient ID: [name] [name], female   DOB: [birth year], 66 y.o.   MRN: [id number] Patient ID: [name] [name], female   DOB: 07/09/1951, 66 y.o.   MRN: [id number] Patient ID: [name] [name], female   DOB: [birth year], 66 y.o.   MRN: [id number] Patient ID: [name] [name], female   DOB: [birth year], 66 y.o.   MRN: [id number] Patient ID: [name] [name], female   DOB: 22-Dec-1950, 66 y.o.   MRN: [id number] Patient ID: [name] [name], female   DOB: 10-20-1951, 66 y.o.   MRN: [id number] Patient ID: [name] [name], female   DOB: [birth year], 66 y.o.   MRN: [id number] Patient ID: [name] [name], female   DOB: September 13, 1951, 66 y.o.   MRN: [id number] Patient ID: [name] [name], female   DOB: 05-Jan-1951, 66 y.o.   MRN: [id number] [place] [address] Progress Note [name] [name] MRN: [id number] DOB: [birth year] Age: 66 y.o.  Date: 07/29/2017  Chief Complaint: Chief Complaint  Patient presents with  . Depression  . Anxiety  . Follow-up   History of present illness Patient is 66 year old married Caucasian female lives with her husband in [place]. 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 She has 2 children and 3 grandchildren.  The patient states she's had anxiety issues for at least 20 years. She has mitral valve prolapse and she claims it "tore up my nerves." She's tried numerous medicines at [facility] as worked the best for her. Currently her anxiety is well controlled her mood is good. She doesn't sleep much at night only gets 3-4 hours of rest. This has been going on for about 5 years and she's not  sure why she. She claims she's adjusted to it.  The patient returns after 3 months. He states that she's been more worried lately because her diabetes is not under good control. Her last A1c was around 10. She's trying to eat healthy foods but doesn't seem to be working that well. She is already on 2 oral medications and may have to go on insulin. She is actually losing not gaining weight. She is scheduled to see an endocrine specialist next month. She states she is not depressed but still only sleeps about 4 hours a night but doesn't want to go on sleeping pills. She is willing to try melatonin. She still maintains that that rexulti has helped her mood     Current psychiatric medication Zoloft 200 mg daily  Family History family history includes ADD / ADHD in her brother; Alcohol abuse in her brother; Anxiety disorder in her maternal aunt, maternal grandmother, and mother; Bipolar disorder in her other; Diabetes in her maternal uncle; Drug abuse in her brother.  Medical history Hyperlipidemia, hypertension and diabetes.  Psychosocial history Patient lives with her husband. She has been married for 42 years. She has 2 children lives in [place].  Mental status examination Patient is pleasant cooperative and maintained a good eye contact. She is well-groomed and well-dressed. She is appropriate to her stated age. She described her  mood as good  and her affect is fairly bright She's alert and oriented x3. Her attention and concentration is okay. She denies any active or passive suicidal thinking and homicidal thinking. She denies any auditory or visual hallucination. Her thought process is logical linear and goal-directed. There are no paranoia or delusions present at this time. Her insight judgment and impulse control is okay.  Lab Results:  No results found for this or any previous visit (from the past 8736 hour(s)). PCP draws routine labs and cholesterol and blood sugar are the primary  concerns.   Assessment  Axis I Anxiety disorder Axis II deferred Axis III see medical history Axis IV mild to moderate  Plan/Discussion: Patient will continue Zoloft to 100 mg twice a day for depression. She will continue Rexulti 2 milligrams daily for augmentation  Recommend to call us back if she is any question or concern if she feels worsening of the symptom.  Followup in 3 months. She declines counseling  Medical Decision Making Problem Points:  Established problem, stable/improving (1), Review of last therapy session (1) and Review of psycho-social stressors (1) Data Points:  Review or order clinical lab tests (1) Review of medication regiment & side effects (2)  I certify that outpatient services furnished can reasonably be expected to improve the patient's condition.   Levonne Spiller, MD

## 2017-09-03 ENCOUNTER — Ambulatory Visit (INDEPENDENT_AMBULATORY_CARE_PROVIDER_SITE_OTHER): Payer: 59 | Admitting: "Endocrinology

## 2017-09-03 ENCOUNTER — Encounter: Payer: Self-pay | Admitting: "Endocrinology

## 2017-09-03 VITALS — BP 125/64 | HR 65 | Ht 62.0 in | Wt 134.0 lb

## 2017-09-03 DIAGNOSIS — E1165 Type 2 diabetes mellitus with hyperglycemia: Secondary | ICD-10-CM | POA: Diagnosis not present

## 2017-09-03 DIAGNOSIS — IMO0002 Reserved for concepts with insufficient information to code with codable children: Secondary | ICD-10-CM

## 2017-09-03 DIAGNOSIS — E782 Mixed hyperlipidemia: Secondary | ICD-10-CM | POA: Diagnosis not present

## 2017-09-03 DIAGNOSIS — E118 Type 2 diabetes mellitus with unspecified complications: Secondary | ICD-10-CM

## 2017-09-03 NOTE — Progress Notes (Signed)
Subjective:    Patient ID: [name] [name], female    DOB: 12/16/1951.  she is being seen in consultation for management of currently uncontrolled symptomatic diabetes requested by  Sinda Du, MD.   Past Medical History:  Diagnosis Date  . Anxiety   . Constipation 03/23/2013  . Depression   . Diabetes mellitus type II   . Elevated cholesterol   . Helicobacter pylori gastritis 07/10/2017   Dx EGD -  1) Omeprazole 20 mg 2 times a day x 14 d 2) Pepto Bismol 2 tabs (262 mg each) 4 times a day x 14 d 3) Metronidazole 250 mg 4 times a day x 14 d 4) doxycycline 100 mg 2 times a day x 14 d  After 14 d stop omeprazole also  In 4 weeks after treatment completed do H. Pylori stool antigen - dx H. Pylori gastritis   . Hx of adenomatous colonic polyps 10/05/2015  . Hyperlipemia 02/21/09   NUC STRESS TEST-NORMAL- EF 74%  . Iron deficiency anemia   . MVP (mitral valve prolapse)   . Rectocele    Past Surgical History:  Procedure Laterality Date  . ABDOMINAL HYSTERECTOMY    . BILATERAL SALPINGOOPHORECTOMY    . carpel tunnel Bilateral 12/17/2009  . COLONOSCOPY    . ESOPHAGOGASTRODUODENOSCOPY     Social History   Social History  . Marital status: Married    Spouse name: N/A  . Number of children: N/A  . Years of education: N/A   Social History Main Topics  . Smoking status: Former Smoker    Years: 30.00    Types: Cigarettes    Quit date: 09/03/1997  . Smokeless tobacco: Current User    Types: Snuff     Comment: call when ready to quit  . Alcohol use No  . Drug use: No  . Sexual activity: No   Other Topics Concern  . None   Social History Narrative   Married 2 sons   She is a housewife   No caffeine   09/23/2015      Outpatient Encounter Prescriptions as of 09/03/2017  Medication Sig  . [DISCONTINUED] canagliflozin (INVOKANA) 300 MG TABS tablet Take 300 mg by mouth daily before breakfast.  . atenolol (TENORMIN) 50 MG tablet Take by mouth. Taking 50 mg in Am and 25 mg in PM   . BIOTIN PO Take 500 mg by mouth daily.   . Brexpiprazole (REXULTI) 2 MG TABS Take 2 mg by mouth daily.  Marland Kitchen CINNAMON PO Take 2,000 mg by mouth daily.   Marland Kitchen dicyclomine (BENTYL) 20 MG tablet Take 20 mg by mouth 3 (three) times daily.   . sertraline (ZOLOFT) 100 MG tablet Take 1 tablet (100 mg total) by mouth 2 (two) times daily.  Marland Kitchen SITagliptin Phosphate (JANUVIA PO) Take 100 mg by mouth daily.  Earnestine Mealing 625 MG tablet Taking 6 Tablets daily  . ZETIA 10 MG tablet Take 10 mg by mouth daily.   . [DISCONTINUED] clotrimazole-betamethasone (LOTRISONE) cream as needed.   . [DISCONTINUED] desoximetasone (TOPICORT) 0.25 % cream as needed.   . [DISCONTINUED] omeprazole (PRILOSEC) 20 MG capsule Take 1 capsule (20 mg total) by mouth 2 (two) times daily.   Facility-Administered Encounter Medications as of 09/03/2017  Medication  . 0.9 %  sodium chloride infusion    ALLERGIES: Allergies  Allergen Reactions  . Codeine Other (See Comments)    Took it years ago and doesn't remember the reaction    VACCINATION STATUS: Immunization History  Administered Date(s) Administered  . Influenza,inj,Quad PF,6+ Mos 09/16/2014    Diabetes  She presents for her initial diabetic visit. She has type 2 diabetes mellitus. Onset time: She was diagnosed at approximate age of 66 years. Her disease course has been worsening (Her last 3 A1c's starting from the most recent: 10.4%, 9.7%, 8.5%.). There are no hypoglycemic associated symptoms. Pertinent negatives for hypoglycemia include no confusion, headaches, pallor or seizures. Associated symptoms include blurred vision, fatigue, polydipsia and polyuria. Pertinent negatives for diabetes include no chest pain and no polyphagia. There are no hypoglycemic complications. Symptoms are worsening. There are no diabetic complications. Risk factors for coronary artery disease include diabetes mellitus, dyslipidemia, sedentary lifestyle and tobacco exposure. Current diabetic treatment  includes oral agent (dual therapy). Her weight is stable. She is following a generally unhealthy diet. When asked about meal planning, she reported none. She has not had a previous visit with a dietitian. She never participates in exercise. (She did not bring any meter nor logs to review today. She admits that she does not monitor blood glucose regularly.) An ACE inhibitor/angiotensin II receptor blocker is not being taken. She does not see a podiatrist.Eye exam is current (She reports no retinopathy from a visit with ophthalmologist 2 years ago.).  Hyperlipidemia  This is a chronic problem. The current episode started more than 1 year ago. Exacerbating diseases include diabetes. Pertinent negatives include no chest pain, myalgias or shortness of breath. Risk factors for coronary artery disease include diabetes mellitus, dyslipidemia and a sedentary lifestyle.    Review of Systems  Constitutional: Positive for fatigue. Negative for chills, fever and unexpected weight change.  HENT: Negative for trouble swallowing and voice change.   Eyes: Positive for blurred vision. Negative for visual disturbance.  Respiratory: Negative for cough, shortness of breath and wheezing.   Cardiovascular: Negative for chest pain, palpitations and leg swelling.  Gastrointestinal: Negative for diarrhea, nausea and vomiting.  Endocrine: Positive for polydipsia and polyuria. Negative for cold intolerance, heat intolerance and polyphagia.  Musculoskeletal: Negative for arthralgias and myalgias.  Skin: Negative for color change, pallor, rash and wound.  Neurological: Negative for seizures and headaches.  Psychiatric/Behavioral: Negative for confusion and suicidal ideas.    Objective:    BP 125/64   Pulse 65   Ht 5' 2" (1.575 m)   Wt 134 lb (60.8 kg)   BMI 24.51 kg/m   Wt Readings from Last 3 Encounters:  09/03/17 134 lb (60.8 kg)  07/03/17 136 lb (61.7 kg)  06/26/17 136 lb 6.4 oz (61.9 kg)     Physical Exam   Constitutional: She is oriented to person, place, and time. She appears well-developed.  HENT:  Head: Normocephalic and atraumatic.  Eyes: EOM are normal.  Neck: Normal range of motion. Neck supple. No tracheal deviation present. No thyromegaly present.  Cardiovascular: Normal rate and regular rhythm.   Pulmonary/Chest: Effort normal and breath sounds normal.  Abdominal: Soft. Bowel sounds are normal. There is no tenderness. There is no guarding.  Musculoskeletal: Normal range of motion. She exhibits no edema.  Neurological: She is alert and oriented to person, place, and time. She has normal reflexes. No cranial nerve deficit. Coordination normal.  Skin: Skin is warm and dry. No rash noted. No erythema. No pallor.  Psychiatric: She has a normal mood and affect. Judgment normal.    CMP ( most recent) CMP     Component Value Date/Time   NA 139 09/13/2014 0730   K 4.2 09/13/2014 0730     CL 102 09/13/2014 0730   CO2 28 09/13/2014 0730   GLUCOSE 146 (H) 09/13/2014 0730   BUN 7 07/18/2015   CREATININE 0.6 07/18/2015   CREATININE 0.73 09/13/2014 0730   CALCIUM 9.8 09/13/2014 0730   PROT 7.4 09/13/2014 0730   ALBUMIN 4.2 09/13/2014 0730   AST 28 09/13/2014 0730   ALT 21 09/13/2014 0730   ALKPHOS 52 09/13/2014 0730   BILITOT 0.4 09/13/2014 0730   GFRNONAA >60 01/09/2008 1239   GFRAA  01/09/2008 1239    >60        The eGFR has been calculated using the MDRD equation. This calculation has not been validated in all clinical     Diabetic Labs (most recent): Lab Results  Component Value Date   HGBA1C 10.4 06/28/2017   HGBA1C 9.7 02/21/2017   HGBA1C 9.3 (H) 09/13/2014     Lipid Panel ( most recent) Lipid Panel     Component Value Date/Time   CHOL 224 (H) 09/13/2014 0730   TRIG 136 09/13/2014 0730   HDL 69 09/13/2014 0730   CHOLHDL 3.2 09/13/2014 0730   VLDL 27 09/13/2014 0730   LDLCALC 128 (H) 09/13/2014 0730     Assessment & Plan:   1. Uncontrolled type 2 diabetes  mellitus with complication, without long-term current use of insulin ([place])  - Patient has currently uncontrolled symptomatic type 2 DM since  65 years of age,  with most recent A1c of 10.4 %. Recent labs reviewed.  - She does not report any gross complications, however, RAECHAL RABEN remains at a high risk for more acute and chronic complications which include CAD, CVA, CKD, retinopathy, and neuropathy. These are all discussed in detail with the patient.  - I have counseled her on diet management by adopting a carbohydrate restricted/protein rich diet.  - Suggestion is made for her to avoid simple carbohydrates  from her diet including Cakes, Sweet Desserts, Ice Cream, Soda (diet and regular), Sweet Tea, Candies, Chips, Cookies, Store Bought Juices, Alcohol in Excess of  1-2 drinks a day, Artificial Sweeteners, and "Sugar-free" Products. This will help patient to have stable blood glucose profile and potentially avoid unintended weight gain.  - I encouraged her to switch to  unprocessed or minimally processed complex starch and increased protein intake (animal or plant source), fruits, and vegetables.  - she is advised to stick to a routine mealtimes to eat 3 meals  a day and avoid unnecessary snacks ( to snack only to correct hypoglycemia).   - she will be scheduled with Jearld Fenton, RDN, CDE for individualized DM education.  - I have approached her with the following individualized plan to manage diabetes and patient agrees:   - I  will proceed to initiate  strict monitoring of glucose 4 times a day-before meals and at bedtime, and return in one week with her meter and logs for reevaluation.  - If her lisinopril profile is significantly above target, she would be considered for basal insulin.  -Patient is encouraged to call clinic for blood glucose levels less than 70 or above 300 mg /dl. - I will continue Januvia 100 mg by mouth daily, therapeutically suitable for patient . - I will  discontinue Invokana , risk outweighs benefit for this patient.  - She is not a suitable candidate for  incretin therapy due to her body habitus.  - Patient specific target  A1c;  LDL, HDL, Triglycerides, and  Waist Circumference were discussed in detail.  2) BP/HTN:  controlled. Patient is not on ACEI/ARB. 3) Lipids/HPL:   lipid panel unknown, patient is not on statins, I advised her to continue Zetia and welchol.  4)  Weight/Diet: CDE Consult will be initiated , exercise, and detailed carbohydrates information provided.  5) Chronic Care/Health Maintenance:  -she  Is not  on ACEI/ARB and Statin medications,   is encouraged to continue to follow up with Ophthalmology, Dentist,  Podiatrist at least yearly or according to recommendations, and advised to  stay away from smoking. I have recommended yearly flu vaccine and pneumonia vaccination at least every 5 years; moderate intensity exercise for up to 150 minutes weekly; and  sleep for at least 7 hours a day.  - Time spent with the patient: 1 hour, of which >50% was spent in obtaining information about her symptoms, reviewing her previous labs, evaluations, and treatments, counseling her about her  currently uncontrolled diabetes, hyperlipidemia, and developing a plan for long term treatment; she had a number of questions which I addressed.  - Patient to bring meter and  blood glucose logs during her next visit.  - I advised patient to maintain close follow up with Hawkins, Edward, MD for primary care needs.  Follow up plan: - Return in about 1 week (around 09/10/2017) for follow up with meter and logs- no labs.  Gebre , MD Phone: [phone]  Fax: [phone]   09/03/2017, 2:03 PM This note was partially dictated with voice recognition software. Similar sounding words can be transcribed inadequately or may not  be corrected upon review. 

## 2017-09-03 NOTE — Patient Instructions (Signed)

## 2017-09-11 ENCOUNTER — Ambulatory Visit (INDEPENDENT_AMBULATORY_CARE_PROVIDER_SITE_OTHER): Payer: 59 | Admitting: "Endocrinology

## 2017-09-11 ENCOUNTER — Encounter: Payer: Self-pay | Admitting: "Endocrinology

## 2017-09-11 VITALS — BP 118/61 | HR 70 | Ht 62.0 in | Wt 137.0 lb

## 2017-09-11 DIAGNOSIS — E118 Type 2 diabetes mellitus with unspecified complications: Secondary | ICD-10-CM | POA: Diagnosis not present

## 2017-09-11 DIAGNOSIS — E1165 Type 2 diabetes mellitus with hyperglycemia: Secondary | ICD-10-CM | POA: Diagnosis not present

## 2017-09-11 DIAGNOSIS — E782 Mixed hyperlipidemia: Secondary | ICD-10-CM | POA: Diagnosis not present

## 2017-09-11 DIAGNOSIS — IMO0002 Reserved for concepts with insufficient information to code with codable children: Secondary | ICD-10-CM

## 2017-09-11 DIAGNOSIS — Z91199 Patient's noncompliance with other medical treatment and regimen due to unspecified reason: Secondary | ICD-10-CM | POA: Insufficient documentation

## 2017-09-11 DIAGNOSIS — Z9119 Patient's noncompliance with other medical treatment and regimen: Secondary | ICD-10-CM

## 2017-09-11 MED ORDER — METFORMIN HCL 500 MG PO TABS: 500.0000 mg | ORAL_TABLET | Freq: Two times a day (BID) | ORAL | 2 refills | Status: DC

## 2017-09-11 NOTE — Progress Notes (Signed)
Subjective:    Patient ID: [name] [name], female    DOB: 1950-12-22.  she is being seen in consultation for management of currently uncontrolled symptomatic diabetes requested by  Kari Baars, MD.   Past Medical History:  Diagnosis Date  . Anxiety   . Constipation 03/23/2013  . Depression   . Diabetes mellitus type II   . Elevated cholesterol   . Helicobacter pylori gastritis 07/10/2017   Dx EGD -  1) Omeprazole 20 mg 2 times a day x 14 d 2) Pepto Bismol 2 tabs (262 mg each) 4 times a day x 14 d 3) Metronidazole 250 mg 4 times a day x 14 d 4) doxycycline 100 mg 2 times a day x 14 d  After 14 d stop omeprazole also  In 4 weeks after treatment completed do H. Pylori stool antigen - dx H. Pylori gastritis   . Hx of adenomatous colonic polyps 10/05/2015  . Hyperlipemia 02/21/09   NUC STRESS TEST-NORMAL- EF 74%  . Iron deficiency anemia   . MVP (mitral valve prolapse)   . Rectocele    Past Surgical History:  Procedure Laterality Date  . ABDOMINAL HYSTERECTOMY    . BILATERAL SALPINGOOPHORECTOMY    . carpel tunnel Bilateral 12/17/2009  . COLONOSCOPY    . ESOPHAGOGASTRODUODENOSCOPY     Social History   Social History  . Marital status: Married    Spouse name: N/A  . Number of children: N/A  . Years of education: N/A   Social History Main Topics  . Smoking status: Former Smoker    Years: 30.00    Types: Cigarettes    Quit date: 09/03/1997  . Smokeless tobacco: Current User    Types: Snuff     Comment: call when ready to quit  . Alcohol use No  . Drug use: No  . Sexual activity: No   Other Topics Concern  . None   Social History Narrative   Married 2 sons   She is a housewife   No caffeine   09/23/2015      Outpatient Encounter Prescriptions as of 09/11/2017  Medication Sig  . atenolol (TENORMIN) 50 MG tablet Take by mouth. Taking 50 mg in Am and 25 mg in PM  . BIOTIN PO Take 500 mg by mouth daily.   . Brexpiprazole (REXULTI) 2 MG TABS Take 2 mg by mouth daily.   Marland Kitchen CINNAMON PO Take 2,000 mg by mouth daily.   Marland Kitchen dicyclomine (BENTYL) 20 MG tablet Take 20 mg by mouth 3 (three) times daily.   . metFORMIN (GLUCOPHAGE) 500 MG tablet Take 1 tablet (500 mg total) by mouth 2 (two) times daily with a meal.  . sertraline (ZOLOFT) 100 MG tablet Take 1 tablet (100 mg total) by mouth 2 (two) times daily.  Marland Kitchen SITagliptin Phosphate (JANUVIA PO) Take 100 mg by mouth daily.  [name] [name] 625 MG tablet Taking 6 Tablets daily  . ZETIA 10 MG tablet Take 10 mg by mouth daily.   . [DISCONTINUED] 0.9 %  sodium chloride infusion    No facility-administered encounter medications on file as of 09/11/2017.     ALLERGIES: Allergies  Allergen Reactions  . Codeine Other (See Comments)    Took it years ago and doesn't remember the reaction    VACCINATION STATUS: Immunization History  Administered Date(s) Administered  . Influenza,inj,Quad PF,6+ Mos 09/16/2014    Diabetes  She presents for her follow-up diabetic visit. She has type 2 diabetes mellitus. Onset time: She  was diagnosed at approximate age of 82 years. Her disease course has been worsening (Her last 3 A1c's starting from the most recent: 10.4%, 9.7%, 8.5%.). There are no hypoglycemic associated symptoms. Pertinent negatives for hypoglycemia include no confusion, headaches, pallor or seizures. Associated symptoms include blurred vision, fatigue, polydipsia and polyuria. Pertinent negatives for diabetes include no chest pain and no polyphagia. There are no hypoglycemic complications. Symptoms are worsening. There are no diabetic complications. Risk factors for coronary artery disease include diabetes mellitus, dyslipidemia, sedentary lifestyle and tobacco exposure. Current diabetic treatment includes oral agent (dual therapy). Her weight is stable. She is following a generally unhealthy diet. When asked about meal planning, she reported none. She has not had a previous visit with a dietitian. She never participates in  exercise. Her breakfast blood glucose range is generally >200 mg/dl. Her lunch blood glucose range is generally >200 mg/dl. Her dinner blood glucose range is generally >200 mg/dl. Her bedtime blood glucose range is generally >200 mg/dl. Her overall blood glucose range is >200 mg/dl. An ACE inhibitor/angiotensin II receptor blocker is not being taken. She does not see a podiatrist.Eye exam is current (She reports no retinopathy from a visit with ophthalmologist 2 years ago.).  Hyperlipidemia  This is a chronic problem. The current episode started more than 1 year ago. Exacerbating diseases include diabetes. Pertinent negatives include no chest pain, myalgias or shortness of breath. Risk factors for coronary artery disease include diabetes mellitus, dyslipidemia and a sedentary lifestyle.    Review of Systems  Constitutional: Positive for fatigue. Negative for chills, fever and unexpected weight change.  HENT: Negative for trouble swallowing and voice change.   Eyes: Positive for blurred vision. Negative for visual disturbance.  Respiratory: Negative for cough, shortness of breath and wheezing.   Cardiovascular: Negative for chest pain, palpitations and leg swelling.  Gastrointestinal: Negative for diarrhea, nausea and vomiting.  Endocrine: Positive for polydipsia and polyuria. Negative for cold intolerance, heat intolerance and polyphagia.  Musculoskeletal: Negative for arthralgias and myalgias.  Skin: Negative for color change, pallor, rash and wound.  Neurological: Negative for seizures and headaches.  Psychiatric/Behavioral: Negative for confusion and suicidal ideas.    Objective:    BP 118/61   Pulse 70   Ht '5\' 2"'$  (1.575 m)   Wt 137 lb (62.1 kg)   BMI 25.06 kg/m   Wt Readings from Last 3 Encounters:  09/11/17 137 lb (62.1 kg)  09/03/17 134 lb (60.8 kg)  07/03/17 136 lb (61.7 kg)     Physical Exam  Constitutional: She is oriented to person, place, and time. She appears  well-developed.  HENT:  Head: Normocephalic and atraumatic.  Eyes: EOM are normal.  Neck: Normal range of motion. Neck supple. No tracheal deviation present. No thyromegaly present.  Cardiovascular: Normal rate and regular rhythm.   Pulmonary/Chest: Effort normal and breath sounds normal.  Abdominal: Soft. Bowel sounds are normal. There is no tenderness. There is no guarding.  Musculoskeletal: Normal range of motion. She exhibits no edema.  Neurological: She is alert and oriented to person, place, and time. She has normal reflexes. No cranial nerve deficit. Coordination normal.  Skin: Skin is warm and dry. No rash noted. No erythema. No pallor.  Psychiatric: She has a normal mood and affect. Judgment normal.    CMP ( most recent) CMP     Component Value Date/Time   NA 139 09/13/2014 0730   K 4.2 09/13/2014 0730   CL 102 09/13/2014 0730   CO2 28  09/13/2014 0730   GLUCOSE 146 (H) 09/13/2014 0730   BUN 7 07/18/2015   CREATININE 0.6 07/18/2015   CREATININE 0.73 09/13/2014 0730   CALCIUM 9.8 09/13/2014 0730   PROT 7.4 09/13/2014 0730   ALBUMIN 4.2 09/13/2014 0730   AST 28 09/13/2014 0730   ALT 21 09/13/2014 0730   ALKPHOS 52 09/13/2014 0730   BILITOT 0.4 09/13/2014 0730   GFRNONAA >60 01/09/2008 1239   GFRAA  01/09/2008 1239    >60        The eGFR has been calculated using the MDRD equation. This calculation has not been validated in all clinical     Diabetic Labs (most recent): Lab Results  Component Value Date   HGBA1C 10.4 06/28/2017   HGBA1C 9.7 02/21/2017   HGBA1C 9.3 (H) 09/13/2014     Lipid Panel ( most recent) Lipid Panel     Component Value Date/Time   CHOL 224 (H) 09/13/2014 0730   TRIG 136 09/13/2014 0730   HDL 69 09/13/2014 0730   CHOLHDL 3.2 09/13/2014 0730   VLDL 27 09/13/2014 0730   LDLCALC 128 (H) 09/13/2014 0730     Assessment & Plan:   1. Uncontrolled type 2 diabetes mellitus with complication, without long-term current use of insulin  ([place])  - Patient has currently uncontrolled symptomatic type 2 DM since  66 years of age,  with most recent A1c of 10.4 %. Recent labs reviewed. - She came with persistently above target blood glucose readings averaging between 250 and 300 mg/dL. - She does not report any gross complications, however, MAYDELL KNOEBEL remains at a high risk for more acute and chronic complications which include CAD, CVA, CKD, retinopathy, and neuropathy. These are all discussed in detail with the patient.  - I have counseled her on diet management by adopting a carbohydrate restricted/protein rich diet.  Suggestion is made for her to avoid simple carbohydrates  from her diet including Cakes, Sweet Desserts, Ice Cream, Soda (diet and regular), Sweet Tea, Candies, Chips, Cookies, Store Bought Juices, Alcohol in Excess of  1-2 drinks a day, Artificial Sweeteners, and "Sugar-free" Products. This will help patient to have stable blood glucose profile and potentially avoid unintended weight gain.   - I encouraged her to switch to  unprocessed or minimally processed complex starch and increased protein intake (animal or plant source), fruits, and vegetables.  - she is advised to stick to a routine mealtimes to eat 3 meals  a day and avoid unnecessary snacks ( to snack only to correct hypoglycemia).   - I have approached her with the following individualized plan to manage diabetes and patient agrees:   - Based on her presenting glycemic profile ( EAG 250-300) , she was approached for at least basal insulin, however, unfortunately she declined this offer- she says she wants to discuss this with her husband. - She would rather retry the metformin that she said she did not tolerate before. - I will continue Januvia 100 mg by mouth daily, therapeutically suitable for patient . - I discussed and initiated low-dose metformin 500 mg by mouth twice a day. - She is willing to continue to monitor blood glucose 2 times  daily-before breakfast and at bedtime. -Patient is encouraged to call clinic for blood glucose levels less than 70 or above 300 mg /dl. - She is not a suitable candidate for  incretin therapy due to her body habitus.  - Patient specific target  A1c;  LDL, HDL, Triglycerides, and  Waist Circumference were discussed in detail.  2) BP/HTN:  controlled. Patient is not on ACEI/ARB. 3) Lipids/HPL:   lipid panel unknown, patient is not on statins, I advised her to continue Zetia and welchol.  4)  Weight/Diet: CDE Consult has been initiated , exercise, and detailed carbohydrates information provided.  5) Chronic Care/Health Maintenance:  -she  Is not  on ACEI/ARB and Statin medications,   is encouraged to continue to follow up with Ophthalmology, Dentist,  Podiatrist at least yearly or according to recommendations, and advised to  stay away from smoking. I have recommended yearly flu vaccine and pneumonia vaccination at least every 5 years; moderate intensity exercise for up to 150 minutes weekly; and  sleep for at least 7 hours a day.  - Time spent with the patient: 25 min, of which >50% was spent in reviewing her sugar logs , discussing her hypo- and hyper-glycemic episodes, reviewing her current and  previous labs and insulin doses and developing a plan to avoid hypo- and hyper-glycemia.   - Patient to bring meter and  blood glucose logs during her next visit.  - I advised patient to maintain close follow up with Sinda Du, MD for primary care needs.  Follow up plan: - Return in about 5 weeks (around 10/16/2017) for meter, and logs, follow up with pre-visit labs, meter, and logs.  Glade Lloyd, MD Phone: [phone]  Fax: [phone]   09/11/2017, 1:16 PM This note was partially dictated with voice recognition software. Similar sounding words can be transcribed inadequately or may not  be corrected upon review.

## 2017-09-11 NOTE — Patient Instructions (Signed)

## 2017-09-18 ENCOUNTER — Other Ambulatory Visit: Payer: Self-pay | Admitting: Adult Health

## 2017-09-18 DIAGNOSIS — Z1231 Encounter for screening mammogram for malignant neoplasm of breast: Secondary | ICD-10-CM

## 2017-09-25 ENCOUNTER — Ambulatory Visit (HOSPITAL_COMMUNITY)
Admission: RE | Admit: 2017-09-25 | Discharge: 2017-09-25 | Disposition: A | Discharge status: Home / Self Care | Payer: 59 | Source: Ambulatory Visit | Attending: Adult Health | Admitting: Adult Health

## 2017-09-25 DIAGNOSIS — Z1231 Encounter for screening mammogram for malignant neoplasm of breast: Secondary | ICD-10-CM | POA: Diagnosis not present

## 2017-09-25 LAB — HM MAMMOGRAPHY

## 2017-10-01 ENCOUNTER — Encounter: Payer: 59 | Attending: Pulmonary Disease | Admitting: Nutrition

## 2017-10-01 VITALS — Ht 62.0 in | Wt 137.0 lb

## 2017-10-01 DIAGNOSIS — E1165 Type 2 diabetes mellitus with hyperglycemia: Secondary | ICD-10-CM | POA: Insufficient documentation

## 2017-10-01 DIAGNOSIS — E118 Type 2 diabetes mellitus with unspecified complications: Secondary | ICD-10-CM | POA: Insufficient documentation

## 2017-10-01 DIAGNOSIS — Z713 Dietary counseling and surveillance: Secondary | ICD-10-CM | POA: Diagnosis present

## 2017-10-01 DIAGNOSIS — IMO0002 Reserved for concepts with insufficient information to code with codable children: Secondary | ICD-10-CM

## 2017-10-01 DIAGNOSIS — E785 Hyperlipidemia, unspecified: Secondary | ICD-10-CM

## 2017-10-01 NOTE — Patient Instructions (Signed)
Goals 1. Follow Plate Method 2. Eat three balanced meals per day 3. Eat 2-3 carb choices per meal 4. No snacks unless BS is less than 70. 5. Take Medications as prescribed Cut out sweets, sodas, diet sodas and processed foods.

## 2017-10-01 NOTE — Progress Notes (Signed)
  Medical Nutrition Therapy:  Appt start time: 1100 end time:  1200.   Assessment:  Primary concerns today: Diabets Type 2. Has had it for 3-5 years. Sees Dr. Dorris Fetch.  Metformin  1000 mg a day and Januvia 100 mg/d. NO longer taking Invokana. Live with husband.  She doees the cooking and shopping.  Most foods baked and broiled and some fried.  Testing blood sugars twice a day.  Forgot her meter FBS:  140- 235 mg/d;.   HS 140-250's.  Eat three meals per day.  Has lost 7-8 lbs when on the Invokana. No longer on Invokana. Denies signs of high blood sugars. Can't tell when bs are high.   Preferred Learning Style:   No preference indicated   Learning Readiness:  Ready  Change in progress   MEDICATIONS: se list   DIETARY INTAKE:  24-hr recall:  B ( AM): Toast and egg, water, coffee Snk ( AM):  misc sweets L ( PM):  Black eyed peas, greens, slaw and cornbread, water Snk ( PM): misc sweets or chips etc. D ( PM):same at lunch. water Snk ( PM): potato chips,  Det Rootbeer Beverages: water, diet sodas.  Usual physical activity: walking  Estimated energy needs: 1500  calories 170 g carbohydrates 112 g protein 42 g fat  Progress Towards Goal(s):  In progress.   Nutritional Diagnosis:  NB-1.1 Food and nutrition-related knowledge deficit As related to Diabetes.  As evidenced by A1C 10.4%..    Intervention:  Nutrition and Diabetes education provided on My Plate, CHO counting, meal planning, portion sizes, timing of meals, avoiding snacks between meals unless having a low blood sugar, target ranges for A1C and blood sugars, signs/symptoms and treatment of hyper/hypoglycemia, monitoring blood sugars, taking medications as prescribed, benefits of exercising 30 minutes per day and prevention of complications of DM.  Goals 1. Follow Plate Method 2. Eat three balanced meals per day 3. Eat 2-3 carb choices per meal 4. No snacks unless BS is less than 70. 5. Take Medications as  prescribed Cut out sweets, sodas, diet sodas and processed foods.  Teaching Method Utilized: Visual Auditory Hands on  Handouts given during visit include:  The Plate Method   Meal Plan Card   Barriers to learning/adherence to lifestyle change:  none  Demonstrated degree of understanding via:  Teach Back   Monitoring/Evaluation:  Dietary intake, exercise,  Meal planning, and body weight in  2 weeks.

## 2017-10-17 LAB — COMPLETE METABOLIC PANEL WITH GFR
AG Ratio: 1.2 (calc) (ref 1.0–2.5)
ALBUMIN MSPROF: 4.2 g/dL (ref 3.6–5.1)
ALT: 20 U/L (ref 6–29)
AST: 30 U/L (ref 10–35)
Alkaline phosphatase (APISO): 48 U/L (ref 33–130)
BUN: 11 mg/dL (ref 7–25)
CALCIUM: 9.4 mg/dL (ref 8.6–10.4)
CO2: 25 mmol/L (ref 20–32)
CREATININE: 0.79 mg/dL (ref 0.50–0.99)
Chloride: 103 mmol/L (ref 98–110)
GFR, EST NON AFRICAN AMERICAN: 79 mL/min/{1.73_m2} (ref >=60)
GFR, Est African American: 92 mL/min/{1.73_m2} (ref >=60)
GLOBULIN: 3.4 g/dL (ref 1.9–3.7)
Glucose, Bld: 142 mg/dL — ABNORMAL HIGH (ref 65–99)
Potassium: 3.9 mmol/L (ref 3.5–5.3)
Sodium: 137 mmol/L (ref 135–146)
TOTAL PROTEIN: 7.6 g/dL (ref 6.1–8.1)
Total Bilirubin: 0.6 mg/dL (ref 0.2–1.2)

## 2017-10-17 LAB — HEMOGLOBIN A1C
EAG (MMOL/L): 11.2 (calc)
Hgb A1c MFr Bld: 8.7 % of total Hgb — ABNORMAL HIGH (ref <5.7)
Mean Plasma Glucose: 203 (calc)

## 2017-10-23 ENCOUNTER — Encounter: Payer: 59 | Attending: Pulmonary Disease | Admitting: Nutrition

## 2017-10-23 ENCOUNTER — Encounter: Payer: Self-pay | Admitting: "Endocrinology

## 2017-10-23 ENCOUNTER — Ambulatory Visit (INDEPENDENT_AMBULATORY_CARE_PROVIDER_SITE_OTHER): Payer: 59 | Admitting: "Endocrinology

## 2017-10-23 VITALS — BP 131/68 | HR 72 | Ht 62.0 in | Wt 137.0 lb

## 2017-10-23 VITALS — Ht 62.0 in | Wt 137.0 lb

## 2017-10-23 DIAGNOSIS — E1165 Type 2 diabetes mellitus with hyperglycemia: Secondary | ICD-10-CM

## 2017-10-23 DIAGNOSIS — E118 Type 2 diabetes mellitus with unspecified complications: Principal | ICD-10-CM

## 2017-10-23 DIAGNOSIS — E782 Mixed hyperlipidemia: Secondary | ICD-10-CM

## 2017-10-23 DIAGNOSIS — IMO0002 Reserved for concepts with insufficient information to code with codable children: Secondary | ICD-10-CM

## 2017-10-23 NOTE — Progress Notes (Signed)
  Medical Nutrition Therapy:  Appt start time: 1030 end time:  1100  Assessment:  Primary concerns today: Diabets Type 2.  Saw Dr. Dorris Fetch today. Metformin 500 mg BID and Januvia. FBS:  120's  Bedtimes. 150-200's.  She has started walking daily 25 minutes. Cut ouit sweets and junk food. Still struggling to give up diet cokes and chips. Make slow progress. Engaged for healthier eating habits and lifestyle.  Lab Results  Component Value Date   HGBA1C 8.7 (H) 10/16/2017   Preferred Learning Style:   No preference indicated   Learning Readiness:  Ready  Change in progress   MEDICATIONS: se list   DIETARY INTAKE:  24-hr recall:  B ( AM): egg and 1 slice toast, fruit. Water, Snk ( AM):   L ( PM): Pot roast, blacked eyed peas and steamed veggies, water Snk ( PM):  D ( PM): Left overs from lunch. Snk ( PM): chips, Beverages: water, diet sodas.  Usual physical activity: walking  Estimated energy needs: 1500  calories 170 g carbohydrates 112 g protein 42 g fat  Progress Towards Goal(s):  In progress.   Nutritional Diagnosis:  NB-1.1 Food and nutrition-related knowledge deficit As related to Diabetes.  As evidenced by A1C 10.4%..    Intervention:  Nutrition and Diabetes education provided on My Plate, CHO counting, meal planning, portion sizes, timing of meals, avoiding snacks between meals unless having a low blood sugar, target ranges for A1C and blood sugars, signs/symptoms and treatment of hyper/hypoglycemia, monitoring blood sugars, taking medications as prescribed, benefits of exercising 30 minutes per day and prevention of complications of DM. Goal . Eat 2-3 carbchoices per meal Get A1C down to 7%. Test blood sugars as discussed. Cut out diet soda and chips Walk 30 minutes daily   Teaching Method Utilized: Visual Auditory Hands on  Handouts given during visit include:  The Plate Method   Meal Plan Card   Barriers to learning/adherence to lifestyle  change:  none  Demonstrated degree of understanding via:  Teach Back   Monitoring/Evaluation:  Dietary intake, exercise,  Meal planning, and body weight in  2-3 months

## 2017-10-23 NOTE — Patient Instructions (Addendum)
Goal . Eat 2-3 carbchoices per meal Get A1C down to 7%. Test blood sugars as discussed. Cut out diet soda and chips Walk 30 minutes daily

## 2017-10-23 NOTE — Progress Notes (Signed)
Subjective:    Patient ID: [name] [name], female    DOB: Aug 16, 1951.  she is being seen in f/u for management of currently uncontrolled symptomatic diabetes requested by  Sinda Du, MD.   Past Medical History:  Diagnosis Date  . Anxiety   . Constipation 03/23/2013  . Depression   . Diabetes mellitus type II   . Elevated cholesterol   . Helicobacter pylori gastritis 07/10/2017   Dx EGD -  1) Omeprazole 20 mg 2 times a day x 14 d 2) Pepto Bismol 2 tabs (262 mg each) 4 times a day x 14 d 3) Metronidazole 250 mg 4 times a day x 14 d 4) doxycycline 100 mg 2 times a day x 14 d  After 14 d stop omeprazole also  In 4 weeks after treatment completed do H. Pylori stool antigen - dx H. Pylori gastritis   . Hx of adenomatous colonic polyps 10/05/2015  . Hyperlipemia 02/21/09   NUC STRESS TEST-NORMAL- EF 74%  . Iron deficiency anemia   . MVP (mitral valve prolapse)   . Rectocele    Past Surgical History:  Procedure Laterality Date  . ABDOMINAL HYSTERECTOMY    . BILATERAL SALPINGOOPHORECTOMY    . carpel tunnel Bilateral 12/17/2009  . COLONOSCOPY    . ESOPHAGOGASTRODUODENOSCOPY     Social History   Socioeconomic History  . Marital status: Married    Spouse name: None  . Number of children: None  . Years of education: None  . Highest education level: None  Social Needs  . Financial resource strain: None  . Food insecurity - worry: None  . Food insecurity - inability: None  . Transportation needs - medical: None  . Transportation needs - non-medical: None  Occupational History  . None  Tobacco Use  . Smoking status: Former Smoker    Years: 30.00    Types: Cigarettes    Last attempt to quit: 09/03/1997    Years since quitting: 20.1  . Smokeless tobacco: Current User    Types: Snuff  . Tobacco comment: call when ready to quit  Substance and Sexual Activity  . Alcohol use: No    Alcohol/week: 0.0 oz  . Drug use: No  . Sexual activity: No    Birth control/protection:  Surgical  Other Topics Concern  . None  Social History Narrative   Married 2 sons   She is a housewife   No caffeine   09/23/2015   Outpatient Encounter Medications as of 10/23/2017  Medication Sig  . atenolol (TENORMIN) 50 MG tablet Take by mouth. Taking 50 mg in Am and 25 mg in PM  . BIOTIN PO Take 500 mg by mouth daily.   . Brexpiprazole (REXULTI) 2 MG TABS Take 2 mg by mouth daily.  Marland Kitchen CINNAMON PO Take 2,000 mg by mouth daily.   Marland Kitchen dicyclomine (BENTYL) 20 MG tablet Take 20 mg by mouth 3 (three) times daily.   . metFORMIN (GLUCOPHAGE) 500 MG tablet Take 1 tablet (500 mg total) by mouth 2 (two) times daily with a meal.  . sertraline (ZOLOFT) 100 MG tablet Take 1 tablet (100 mg total) by mouth 2 (two) times daily.  Marland Kitchen SITagliptin Phosphate (JANUVIA PO) Take 100 mg by mouth daily.  Earnestine Mealing 625 MG tablet Taking 6 Tablets daily  . ZETIA 10 MG tablet Take 10 mg by mouth daily.    No facility-administered encounter medications on file as of 10/23/2017.     ALLERGIES: Allergies  Allergen Reactions  .  Codeine Other (See Comments)    Took it years ago and doesn't remember the reaction    VACCINATION STATUS: Immunization History  Administered Date(s) Administered  . Influenza,inj,Quad PF,6+ Mos 09/16/2014    Diabetes  She presents for her follow-up diabetic visit. She has type 2 diabetes mellitus. Onset time: She was diagnosed at approximate age of 52 years. Her disease course has been improving (Her last 3 A1c's starting from the most recent: 10.4%, 9.7%, 8.5%.). There are no hypoglycemic associated symptoms. Pertinent negatives for hypoglycemia include no confusion, headaches, pallor or seizures. Associated symptoms include blurred vision and fatigue. Pertinent negatives for diabetes include no chest pain, no polydipsia, no polyphagia and no polyuria. There are no hypoglycemic complications. Symptoms are improving. There are no diabetic complications. Risk factors for coronary artery  disease include diabetes mellitus, dyslipidemia, sedentary lifestyle and tobacco exposure. Current diabetic treatment includes oral agent (dual therapy). Her weight is stable. She is following a generally unhealthy diet. When asked about meal planning, she reported none. She has not had a previous visit with a dietitian. She never participates in exercise. Her breakfast blood glucose range is generally 180-200 mg/dl. Her bedtime blood glucose range is generally 180-200 mg/dl. Her overall blood glucose range is 180-200 mg/dl. An ACE inhibitor/angiotensin II receptor blocker is not being taken. She does not see a podiatrist.Eye exam is current (She reports no retinopathy from a visit with ophthalmologist 2 years ago.).  Hyperlipidemia  This is a chronic problem. The current episode started more than 1 year ago. Exacerbating diseases include diabetes. Pertinent negatives include no chest pain, myalgias or shortness of breath. Risk factors for coronary artery disease include diabetes mellitus, dyslipidemia and a sedentary lifestyle.    Review of Systems  Constitutional: Positive for fatigue. Negative for chills, fever and unexpected weight change.  HENT: Negative for trouble swallowing and voice change.   Eyes: Positive for blurred vision. Negative for visual disturbance.  Respiratory: Negative for cough, shortness of breath and wheezing.   Cardiovascular: Negative for chest pain, palpitations and leg swelling.  Gastrointestinal: Negative for diarrhea, nausea and vomiting.  Endocrine: Negative for cold intolerance, heat intolerance, polydipsia, polyphagia and polyuria.  Musculoskeletal: Negative for arthralgias and myalgias.  Skin: Negative for color change, pallor, rash and wound.  Neurological: Negative for seizures and headaches.  Psychiatric/Behavioral: Negative for confusion and suicidal ideas.    Objective:    BP 131/68 (BP Location: Right Arm, Patient Position: Sitting, Cuff Size: Normal)    Pulse 72   Ht 5\' 2"  (1.575 m)   Wt 137 lb (62.1 kg)   BMI 25.06 kg/m   Wt Readings from Last 3 Encounters:  10/23/17 137 lb (62.1 kg)  10/01/17 137 lb (62.1 kg)  09/11/17 137 lb (62.1 kg)     Physical Exam  Constitutional: She is oriented to person, place, and time. She appears well-developed.  HENT:  Head: Normocephalic and atraumatic.  Eyes: EOM are normal.  Neck: Normal range of motion. Neck supple. No tracheal deviation present. No thyromegaly present.  Cardiovascular: Normal rate and regular rhythm.  Pulmonary/Chest: Effort normal and breath sounds normal.  Abdominal: Soft. Bowel sounds are normal. There is no tenderness. There is no guarding.  Musculoskeletal: Normal range of motion. She exhibits no edema.  Neurological: She is alert and oriented to person, place, and time. She has normal reflexes. No cranial nerve deficit. Coordination normal.  Skin: Skin is warm and dry. No rash noted. No erythema. No pallor.  Psychiatric: She has  a normal mood and affect. Judgment normal.    CMP ( most recent) CMP     Component Value Date/Time   NA 137 10/16/2017 0727   K 3.9 10/16/2017 0727   CL 103 10/16/2017 0727   CO2 25 10/16/2017 0727   GLUCOSE 142 (H) 10/16/2017 0727   BUN 11 10/16/2017 0727   BUN 7 07/18/2015   CREATININE 0.79 10/16/2017 0727   CALCIUM 9.4 10/16/2017 0727   PROT 7.6 10/16/2017 0727   ALBUMIN 4.2 09/13/2014 0730   AST 30 10/16/2017 0727   ALT 20 10/16/2017 0727   ALKPHOS 52 09/13/2014 0730   BILITOT 0.6 10/16/2017 0727   GFRNONAA 79 10/16/2017 0727   GFRAA 92 10/16/2017 0727     Diabetic Labs (most recent): Lab Results  Component Value Date   HGBA1C 8.7 (H) 10/16/2017   HGBA1C 10.4 06/28/2017   HGBA1C 9.7 02/21/2017     Lipid Panel ( most recent) Lipid Panel     Component Value Date/Time   CHOL 224 (H) 09/13/2014 0730   TRIG 136 09/13/2014 0730   HDL 69 09/13/2014 0730   CHOLHDL 3.2 09/13/2014 0730   VLDL 27 09/13/2014 0730   LDLCALC  128 (H) 09/13/2014 0730     Assessment & Plan:   1. Uncontrolled type 2 diabetes mellitus with complication, without long-term current use of insulin ([place])  - Patient has currently uncontrolled symptomatic type 2 DM since  66 years of age. - She came with improved glycemic profile and significantly improved A1c of 8.7% from 10.4%. Recent labs reviewed.  - She does not report any gross complications, however, DARLEAN WARMOTH remains at a high risk for more acute and chronic complications which include CAD, CVA, CKD, retinopathy, and neuropathy. These are all discussed in detail with the patient.  - I have counseled her on diet management by adopting a carbohydrate restricted/protein rich diet.  -  Suggestion is made for her to avoid simple carbohydrates  from her diet including Cakes, Sweet Desserts / Pastries, Ice Cream, Soda (diet and regular), Sweet Tea, Candies, Chips, Cookies, Store Bought Juices, Alcohol in Excess of  1-2 drinks a day, Artificial Sweeteners, and "Sugar-free" Products. This will help patient to have stable blood glucose profile and potentially avoid unintended weight gain.  - I encouraged her to switch to  unprocessed or minimally processed complex starch and increased protein intake (animal or plant source), fruits, and vegetables.  - she is advised to stick to a routine mealtimes to eat 3 meals  a day and avoid unnecessary snacks ( to snack only to correct hypoglycemia).   - I have approached her with the following individualized plan to manage diabetes and patient agrees:   - Based on her presenting with improved glycemic profile, and A1c of 8.7% from 10.4%, she will not need insulin treatment for now.  - She has tolerated metformin, will continue at 500 minute grams by mouth twice a day. - I will continue Januvia 100 mg by mouth daily, therapeutically suitable for patient .  -Patient is encouraged to call clinic for blood glucose levels less than 70 or above 300  mg /dl. - She is not a suitable candidate for  incretin therapy due to her body habitus.  - Patient specific target  A1c;  LDL, HDL, Triglycerides, and  Waist Circumference were discussed in detail.  2) BP/HTN:  controlled. Patient is not on ACEI/ARB. 3) Lipids/HPL:   lipid panel unknown, patient is not on statins, I  advised her to continue Zetia and welchol.  4)  Weight/Diet: CDE Consult is in progress , exercise, and detailed carbohydrates information provided.  5) Chronic Care/Health Maintenance:  -she  Is not  on ACEI/ARB and Statin medications,   is encouraged to continue to follow up with Ophthalmology, Dentist,  Podiatrist at least yearly or according to recommendations, and advised to  stay away from smoking. I have recommended yearly flu vaccine and pneumonia vaccination at least every 5 years; moderate intensity exercise for up to 150 minutes weekly; and  sleep for at least 7 hours a day.  - I advised patient to maintain close follow up with Sinda Du, MD for primary care needs.  Follow up plan: - Return in about 3 months (around 01/23/2018) for follow up with pre-visit labs. - Time spent with the patient: 25 min, of which >50% was spent in reviewing her sugar logs , discussing her hypo- and hyper-glycemic episodes, reviewing her current and  previous labs and insulin doses and developing a plan to avoid hypo- and hyper-glycemia.   Glade Lloyd, MD Phone: [phone]  Fax: [phone]   10/23/2017, 10:22 AM This note was partially dictated with voice recognition software. Similar sounding words can be transcribed inadequately or may not  be corrected upon review.

## 2017-10-24 ENCOUNTER — Encounter (HOSPITAL_COMMUNITY): Payer: Self-pay | Admitting: Psychiatry

## 2017-10-24 ENCOUNTER — Ambulatory Visit (INDEPENDENT_AMBULATORY_CARE_PROVIDER_SITE_OTHER): Payer: 59 | Admitting: Psychiatry

## 2017-10-24 VITALS — BP 133/65 | HR 64 | Ht 62.0 in | Wt 136.6 lb

## 2017-10-24 DIAGNOSIS — F331 Major depressive disorder, recurrent, moderate: Secondary | ICD-10-CM | POA: Diagnosis not present

## 2017-10-24 DIAGNOSIS — Z811 Family history of alcohol abuse and dependence: Secondary | ICD-10-CM

## 2017-10-24 DIAGNOSIS — Z87891 Personal history of nicotine dependence: Secondary | ICD-10-CM

## 2017-10-24 DIAGNOSIS — Z818 Family history of other mental and behavioral disorders: Secondary | ICD-10-CM

## 2017-10-24 DIAGNOSIS — G4700 Insomnia, unspecified: Secondary | ICD-10-CM

## 2017-10-24 DIAGNOSIS — Z813 Family history of other psychoactive substance abuse and dependence: Secondary | ICD-10-CM

## 2017-10-24 MED ORDER — BREXPIPRAZOLE 2 MG PO TABS: 2.0000 mg | ORAL_TABLET | Freq: Every day | ORAL | 2 refills | Status: DC

## 2017-10-24 MED ORDER — SERTRALINE HCL 100 MG PO TABS: 100.0000 mg | ORAL_TABLET | Freq: Two times a day (BID) | ORAL | 3 refills | Status: DC

## 2017-10-24 NOTE — Progress Notes (Signed)
[facility] MD/PA/NP OP Progress Note  10/24/2017 10:47 AM [name] [name]  MRN:  [id number]  Chief Complaint:  Chief Complaint    Depression; Anxiety; Follow-up     HPI: Patient is 66 year old married Caucasian female lives with her husband in [place]. 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 She has 2 children and 3 grandchildren.  The patient states she's had anxiety issues for at least 20 years. She has mitral valve prolapse and she claims it "tore up my nerves." She's tried numerous medicines at [facility] as worked the best for her. Currently her anxiety is well controlled her mood is good. She doesn't sleep much at night only gets 3-4 hours of rest. This has been going on for about 5 years and she's not sure why she. She claims she's adjusted to it.  She returns after 3 months.  She states that she has been working very hard to get her A1c down.  It was up as high as 10.4.  She is working with a Engineer, maintenance (IT) and changing her eating habits.  It is now down to 8.7.  She is also exercising and walking.  This is helped her sleep a little bit.  For the most part her mood has been stable and she denies being significantly depressed.  She still does not sleep all that much but it may be a little bit better with the exercise.  She does not feel particularly tired.  The addition of rexulti has helped her mood Visit Diagnosis: Major depression  Past Psychiatric History: Long-term outpatient treatment  Past Medical History:  Past Medical History:  Diagnosis Date  . Anxiety   . Constipation 03/23/2013  . Depression   . Diabetes mellitus type II   . Elevated cholesterol   . Helicobacter pylori gastritis 07/10/2017   Dx EGD -  1) Omeprazole 20 mg 2 times a day x 14 d 2) Pepto Bismol 2 tabs (262 mg each) 4 times a day x 14 d 3) Metronidazole 250 mg 4 times a day x 14 d 4) doxycycline 100 mg 2 times a day x 14 d  After 14 d stop omeprazole also  In 4 weeks after treatment completed do H. Pylori stool  antigen - dx H. Pylori gastritis   . Hx of adenomatous colonic polyps 10/05/2015  . Hyperlipemia 02/21/09   NUC STRESS TEST-NORMAL- EF 74%  . Iron deficiency anemia   . MVP (mitral valve prolapse)   . Rectocele     Past Surgical History:  Procedure Laterality Date  . ABDOMINAL HYSTERECTOMY    . BILATERAL SALPINGOOPHORECTOMY    . carpel tunnel Bilateral 12/17/2009  . COLONOSCOPY    . ESOPHAGOGASTRODUODENOSCOPY      Family Psychiatric History: See below Family History:  Family History  Problem Relation Age of Onset  . Anxiety disorder Mother   . ADD / ADHD Brother   . Alcohol abuse Brother        x 3  . Drug abuse Brother   . Anxiety disorder Maternal Aunt   . Anxiety disorder Maternal Grandmother   . Bipolar disorder Other   . Diabetes Maternal Uncle   . Dementia Neg Hx   . OCD Neg Hx   . Paranoid behavior Neg Hx   . Physical abuse Neg Hx   . Schizophrenia Neg Hx   . Seizures Neg Hx   . Sexual abuse Neg Hx   . Colon cancer Neg Hx   . Colon polyps Neg Hx   .  Pancreatic cancer Neg Hx   . Esophageal cancer Neg Hx   . Stomach cancer Neg Hx   . Kidney disease Neg Hx   . Liver disease Neg Hx     Social History:  Social History   Socioeconomic History  . Marital status: Married    Spouse name: None  . Number of children: None  . Years of education: None  . Highest education level: None  Social Needs  . Financial resource strain: None  . Food insecurity - worry: None  . Food insecurity - inability: None  . Transportation needs - medical: None  . Transportation needs - non-medical: None  Occupational History  . None  Tobacco Use  . Smoking status: Former Smoker    Years: 30.00    Types: Cigarettes    Last attempt to quit: 09/03/1997    Years since quitting: 20.1  . Smokeless tobacco: Current User    Types: Snuff  . Tobacco comment: call when ready to quit  Substance and Sexual Activity  . Alcohol use: No    Alcohol/week: 0.0 oz  . Drug use: No  . Sexual  activity: No    Birth control/protection: Surgical  Other Topics Concern  . None  Social History Narrative   Married 2 sons   She is a housewife   No caffeine   09/23/2015    Allergies:  Allergies  Allergen Reactions  . Codeine Other (See Comments)    Took it years ago and doesn't remember the reaction    Metabolic Disorder Labs: Lab Results  Component Value Date   HGBA1C 8.7 (H) 10/16/2017   MPG 203 10/16/2017   MPG 220 (H) 09/13/2014   No results found for: PROLACTIN Lab Results  Component Value Date   CHOL 224 (H) 09/13/2014   TRIG 136 09/13/2014   HDL 69 09/13/2014   CHOLHDL 3.2 09/13/2014   VLDL 27 09/13/2014   LDLCALC 128 (H) 09/13/2014   Lab Results  Component Value Date   TSH 2.208 09/13/2014    Therapeutic Level Labs: No results found for: LITHIUM No results found for: VALPROATE No components found for:  CBMZ  Current Medications: Current Outpatient Medications  Medication Sig Dispense Refill  . atenolol (TENORMIN) 50 MG tablet Take by mouth. Taking 50 mg in Am and 25 mg in PM    . BIOTIN PO Take 500 mg by mouth daily.     . Brexpiprazole (REXULTI) 2 MG TABS Take 2 mg daily by mouth. 30 tablet 2  . CINNAMON PO Take 2,000 mg by mouth daily.     Marland Kitchen dicyclomine (BENTYL) 20 MG tablet Take 20 mg by mouth 3 (three) times daily.     . metFORMIN (GLUCOPHAGE) 500 MG tablet Take 1 tablet (500 mg total) by mouth 2 (two) times daily with a meal. 60 tablet 2  . sertraline (ZOLOFT) 100 MG tablet Take 1 tablet (100 mg total) 2 (two) times daily by mouth. 60 tablet 3  . SITagliptin Phosphate (JANUVIA PO) Take 100 mg by mouth daily.    Earnestine Mealing 625 MG tablet Taking 6 Tablets daily    . ZETIA 10 MG tablet Take 10 mg by mouth daily.      No current facility-administered medications for this visit.      Musculoskeletal: Strength & Muscle Tone: within normal limits Gait & Station: normal Patient leans: N/A  Psychiatric Specialty Exam: Review of Systems   Psychiatric/Behavioral: The patient has insomnia.   All other systems reviewed  and are negative.   Blood pressure 133/65, pulse 64, height 5\' 2"  (1.575 m), weight 136 lb 9.6 oz (62 kg).Body mass index is 24.98 kg/m.  General Appearance: Casual and Fairly Groomed  Eye Contact:  Good  Speech:  Clear and Coherent  Volume:  Normal  Mood:  Euthymic  Affect:  Congruent  Thought Process:  Goal Directed  Orientation:  Full (Time, Place, and Person)  Thought Content: WDL   Suicidal Thoughts:  No  Homicidal Thoughts:  No  Memory:  Immediate;   Good Recent;   Good Remote;   Good  Judgement:  Fair  Insight:  Fair  Psychomotor Activity:  Normal  Concentration:  Concentration: Good and Attention Span: Good  Recall:  Good  Fund of Knowledge: Good  Language: Good  Akathisia:  No  Handed:  Right  AIMS (if indicated): not done  Assets:  Communication Skills Desire for Improvement Resilience Social Support  ADL's:  Intact  Cognition: WNL  Sleep:  Fair   Screenings: PHQ2-9     Office Visit from 10/23/2017 in [facility] Nutrition from 10/01/2017 in [facility] Office Visit from 09/11/2017 in [facility] Office Visit from 09/03/2017 in [facility]  PHQ-2 Total Score  0  0  0  0       Assessment and Plan: Patient is a 66 year old female with a history of depression.  She has had a good response to Zoloft 200 mg daily and the addition of Rexulti has helped as well.  I praised her for working on her diet and exercise which will help mood.  She will return to see me in 3 months   Levonne Spiller, MD 10/24/2017, 10:47 AM

## 2017-10-31 ENCOUNTER — Encounter: Payer: Self-pay | Admitting: Nutrition

## 2017-11-15 ENCOUNTER — Other Ambulatory Visit: Payer: Self-pay | Admitting: Cardiovascular Disease

## 2017-12-06 ENCOUNTER — Other Ambulatory Visit: Payer: Self-pay | Admitting: "Endocrinology

## 2017-12-26 ENCOUNTER — Other Ambulatory Visit (HOSPITAL_COMMUNITY): Payer: Self-pay | Admitting: Pulmonary Disease

## 2017-12-26 DIAGNOSIS — M7989 Other specified soft tissue disorders: Secondary | ICD-10-CM

## 2017-12-26 DIAGNOSIS — R609 Edema, unspecified: Secondary | ICD-10-CM

## 2017-12-26 LAB — HM DIABETES FOOT EXAM

## 2017-12-27 ENCOUNTER — Ambulatory Visit (HOSPITAL_COMMUNITY)
Admission: RE | Admit: 2017-12-27 | Discharge: 2017-12-27 | Disposition: A | Discharge status: Home / Self Care | Payer: 59 | Source: Ambulatory Visit | Attending: Pulmonary Disease | Admitting: Pulmonary Disease

## 2017-12-27 ENCOUNTER — Other Ambulatory Visit (HOSPITAL_COMMUNITY): Payer: Self-pay

## 2017-12-27 DIAGNOSIS — R2243 Localized swelling, mass and lump, lower limb, bilateral: Secondary | ICD-10-CM | POA: Insufficient documentation

## 2017-12-27 DIAGNOSIS — I080 Rheumatic disorders of both mitral and aortic valves: Secondary | ICD-10-CM | POA: Diagnosis not present

## 2017-12-27 DIAGNOSIS — R609 Edema, unspecified: Secondary | ICD-10-CM | POA: Insufficient documentation

## 2017-12-27 DIAGNOSIS — E785 Hyperlipidemia, unspecified: Secondary | ICD-10-CM | POA: Insufficient documentation

## 2017-12-27 DIAGNOSIS — Z87891 Personal history of nicotine dependence: Secondary | ICD-10-CM | POA: Diagnosis not present

## 2017-12-27 DIAGNOSIS — E119 Type 2 diabetes mellitus without complications: Secondary | ICD-10-CM | POA: Diagnosis not present

## 2017-12-27 DIAGNOSIS — M7989 Other specified soft tissue disorders: Secondary | ICD-10-CM

## 2017-12-27 NOTE — Progress Notes (Signed)
*  PRELIMINARY RESULTS* Echocardiogram 2D Echocardiogram has been performed.  Leavy Cella 12/27/2017, 2:24 PM

## 2018-01-14 ENCOUNTER — Telehealth: Payer: Self-pay | Admitting: Nutrition

## 2018-01-14 NOTE — Telephone Encounter (Signed)
TC from [employer]. BS was 311 mg/dl AFTER(not before)  she ate breakfast; toast and PB this am. Hasn't been testing per Dr. Dorris Fetch' recommendation. +Dry mouth, + Urination +Craves ice  And sweets.  Eating three meals per day; B)toast + PB L) PIntos, biscuit, chicken,  D) Same as lunch. Drinks some water but not a lot.  Metformin 500 mg BID Januvia 100 mg a day A1C 10/18   8.7%  She is scheduled to get labs done next Monday.  Per Discussion with DR. Nida, he requested I contact patient and encourage increase water to 4-5 bottles per day, increase lower carb vegetables and test blood sugars BEFORE meals and bedtime( 4 times per day). IF BS is above 300 BEFORE meals 3x, call his office. Pt. Verbalized understanding.

## 2018-01-20 LAB — COMPLETE METABOLIC PANEL WITH GFR
AG Ratio: 1.2 (calc) (ref 1.0–2.5)
ALT: 21 U/L (ref 6–29)
AST: 35 U/L (ref 10–35)
Albumin: 4 g/dL (ref 3.6–5.1)
Alkaline phosphatase (APISO): 50 U/L (ref 33–130)
BUN: 10 mg/dL (ref 7–25)
CO2: 26 mmol/L (ref 20–32)
CREATININE: 0.78 mg/dL (ref 0.50–0.99)
Calcium: 8.9 mg/dL (ref 8.6–10.4)
Chloride: 104 mmol/L (ref 98–110)
GFR, EST NON AFRICAN AMERICAN: 80 mL/min/{1.73_m2} (ref >=60)
GFR, Est African American: 93 mL/min/{1.73_m2} (ref >=60)
GLOBULIN: 3.4 g/dL (ref 1.9–3.7)
Glucose, Bld: 251 mg/dL — ABNORMAL HIGH (ref 65–99)
Potassium: 4.1 mmol/L (ref 3.5–5.3)
Sodium: 137 mmol/L (ref 135–146)
Total Bilirubin: 0.6 mg/dL (ref 0.2–1.2)
Total Protein: 7.4 g/dL (ref 6.1–8.1)

## 2018-01-20 LAB — HEMOGLOBIN A1C
EAG (MMOL/L): 13 (calc)
Hgb A1c MFr Bld: 9.8 % of total Hgb — ABNORMAL HIGH (ref <5.7)
MEAN PLASMA GLUCOSE: 235 (calc)

## 2018-01-24 ENCOUNTER — Ambulatory Visit (HOSPITAL_COMMUNITY): Payer: Self-pay | Admitting: Psychiatry

## 2018-01-24 ENCOUNTER — Ambulatory Visit (HOSPITAL_COMMUNITY): Payer: 59 | Admitting: Psychiatry

## 2018-01-24 ENCOUNTER — Encounter (HOSPITAL_COMMUNITY): Payer: Self-pay | Admitting: Psychiatry

## 2018-01-24 VITALS — BP 137/74 | HR 68 | Ht 62.0 in | Wt 137.0 lb

## 2018-01-24 DIAGNOSIS — F331 Major depressive disorder, recurrent, moderate: Secondary | ICD-10-CM

## 2018-01-24 DIAGNOSIS — I341 Nonrheumatic mitral (valve) prolapse: Secondary | ICD-10-CM

## 2018-01-24 DIAGNOSIS — E118 Type 2 diabetes mellitus with unspecified complications: Secondary | ICD-10-CM | POA: Diagnosis not present

## 2018-01-24 DIAGNOSIS — Z811 Family history of alcohol abuse and dependence: Secondary | ICD-10-CM

## 2018-01-24 DIAGNOSIS — F419 Anxiety disorder, unspecified: Secondary | ICD-10-CM

## 2018-01-24 DIAGNOSIS — G4700 Insomnia, unspecified: Secondary | ICD-10-CM

## 2018-01-24 DIAGNOSIS — Z818 Family history of other mental and behavioral disorders: Secondary | ICD-10-CM | POA: Diagnosis not present

## 2018-01-24 DIAGNOSIS — Z813 Family history of other psychoactive substance abuse and dependence: Secondary | ICD-10-CM

## 2018-01-24 DIAGNOSIS — F17220 Nicotine dependence, chewing tobacco, uncomplicated: Secondary | ICD-10-CM | POA: Diagnosis not present

## 2018-01-24 MED ORDER — SERTRALINE HCL 100 MG PO TABS: 100.0000 mg | ORAL_TABLET | Freq: Two times a day (BID) | ORAL | 3 refills | Status: DC

## 2018-01-24 MED ORDER — BREXPIPRAZOLE 2 MG PO TABS: 2.0000 mg | ORAL_TABLET | Freq: Every day | ORAL | 3 refills | Status: DC

## 2018-01-24 NOTE — Progress Notes (Signed)
[facility] MD/PA/NP OP Progress Note  01/24/2018 10:20 AM [name] [name]  MRN:  [id number]  Chief Complaint:  Chief Complaint    Depression; Follow-up     EHU:[id number] is 67 year old married Caucasian female lives with her husband in [place]. 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 She has 2 children and 3 grandchildren.  The patient states she's had anxiety issues for at least 20 years. She has mitral valve prolapse and she claims it "tore up my nerves." She's tried numerous medicines at [facility] as worked the best for her. Currently her anxiety is well controlled her mood is good. She doesn't sleep much at night only gets 3-4 hours of rest. This has been going on for about 5 years and she's not sure why she. She claims she's adjusted to it.  She returns after 3 months.  She states that she is struggling to get her diabetes under control.  She still not sleeping well and only sleeps 3-4 hours a night.  On further questioning it turns out that she falls asleep on the couch and from the TV every night sleeps 3 or 4 hours and then gets up and often has a snack.  I explained that this is not good for either her mental health or her diabetes.   this is probably why her blood sugar is high in the morning and also why she is so tired.  She states her husband gets up and goes to bed at 9 PM and I urged her to do the same thing because a person cannot get proper sleep on the couch.  She states overall her mood is good and she is less anxious and has more energy since we added Rexulti Visit Diagnosis:    ICD-10-CM   1. 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 Anxiety   . Constipation 03/23/2013  . Depression   . Diabetes mellitus type II   . Elevated cholesterol   . Helicobacter pylori gastritis 07/10/2017   Dx EGD -  1) Omeprazole 20  mg 2 times a day x 14 d 2) Pepto Bismol 2 tabs (262 mg each) 4 times a day x 14 d 3) Metronidazole 250 mg 4 times a day x 14 d 4) doxycycline 100 mg 2 times a day x 14 d  After 14 d stop omeprazole also  In 4 weeks after treatment completed do H. Pylori stool antigen - dx H. Pylori gastritis   . Hx of adenomatous colonic polyps 10/05/2015  . Hyperlipemia 02/21/09   NUC STRESS TEST-NORMAL- EF 74%  . Iron deficiency anemia   . MVP (mitral valve prolapse)   . Rectocele     Past Surgical History:  Procedure Laterality Date  . ABDOMINAL HYSTERECTOMY    . BILATERAL SALPINGOOPHORECTOMY    . carpel tunnel Bilateral 12/17/2009  . COLONOSCOPY    . ESOPHAGOGASTRODUODENOSCOPY      Family Psychiatric History: See below  Family History:  Family History  Problem Relation Age of Onset  . Anxiety disorder Mother   . ADD / ADHD Brother   . Alcohol abuse Brother        x 3  . Drug abuse Brother   . Anxiety disorder Maternal Aunt   . Anxiety disorder Maternal Grandmother   . Bipolar disorder Other   . Diabetes Maternal Uncle   .  Dementia Neg Hx   . OCD Neg Hx   . Paranoid behavior Neg Hx   . Physical abuse Neg Hx   . Schizophrenia Neg Hx   . Seizures Neg Hx   . Sexual abuse Neg Hx   . Colon cancer Neg Hx   . Colon polyps Neg Hx   . Pancreatic cancer Neg Hx   . Esophageal cancer Neg Hx   . Stomach cancer Neg Hx   . Kidney disease Neg Hx   . Liver disease Neg Hx     Social History:  Social History   Socioeconomic History  . Marital status: Married    Spouse name: None  . Number of children: None  . Years of education: None  . Highest education level: None  Social Needs  . Financial resource strain: None  . Food insecurity - worry: None  . Food insecurity - inability: None  . Transportation needs - medical: None  . Transportation needs - non-medical: None  Occupational History  . None  Tobacco Use  . Smoking status: Former Smoker    Years: 30.00    Types: Cigarettes    Last  attempt to quit: 09/03/1997    Years since quitting: 20.4  . Smokeless tobacco: Current User    Types: Snuff  . Tobacco comment: call when ready to quit  Substance and Sexual Activity  . Alcohol use: No    Alcohol/week: 0.0 oz  . Drug use: No  . Sexual activity: No    Birth control/protection: Surgical  Other Topics Concern  . None  Social History Narrative   Married 2 sons   She is a housewife   No caffeine   09/23/2015    Allergies:  Allergies  Allergen Reactions  . Codeine Other (See Comments)    Took it years ago and doesn't remember the reaction    Metabolic Disorder Labs: Lab Results  Component Value Date   HGBA1C 9.8 (H) 01/20/2018   MPG 235 01/20/2018   MPG 203 10/16/2017   No results found for: PROLACTIN Lab Results  Component Value Date   CHOL 224 (H) 09/13/2014   TRIG 136 09/13/2014   HDL 69 09/13/2014   CHOLHDL 3.2 09/13/2014   VLDL 27 09/13/2014   LDLCALC 128 (H) 09/13/2014   Lab Results  Component Value Date   TSH 2.208 09/13/2014    Therapeutic Level Labs: No results found for: LITHIUM No results found for: VALPROATE No components found for:  CBMZ  Current Medications: Current Outpatient Medications  Medication Sig Dispense Refill  . atenolol (TENORMIN) 50 MG tablet TAKE 1 TABLET (50 MG TOTAL) BY MOUTH 2 (TWO) TIMES DAILY. 180 tablet 3  . BIOTIN PO Take 500 mg by mouth daily.     . Brexpiprazole (REXULTI) 2 MG TABS Take 2 mg by mouth daily. 30 tablet 3  . CINNAMON PO Take 2,000 mg by mouth daily.     Marland Kitchen dicyclomine (BENTYL) 20 MG tablet Take 20 mg by mouth 3 (three) times daily.     . metFORMIN (GLUCOPHAGE) 500 MG tablet TAKE 1 TABLET (500 MG TOTAL) BY MOUTH 2 (TWO) TIMES DAILY WITH A MEAL. 60 tablet 2  . sertraline (ZOLOFT) 100 MG tablet Take 1 tablet (100 mg total) by mouth 2 (two) times daily. 60 tablet 3  . SITagliptin Phosphate (JANUVIA PO) Take 100 mg by mouth daily.    Earnestine Mealing 625 MG tablet Taking 6 Tablets daily    . ZETIA 10 MG  tablet Take 10 mg by mouth daily.      No current facility-administered medications for this visit.      Musculoskeletal: Strength & Muscle Tone: within normal limits Gait & Station: normal Patient leans: N/A  Psychiatric Specialty Exam: Review of Systems  Psychiatric/Behavioral: The patient has insomnia.   All other systems reviewed and are negative.   Blood pressure 137/74, pulse 68, height 5\' 2"  (1.575 m), weight 137 lb (62.1 kg), SpO2 99 %.Body mass index is 25.06 kg/m.  General Appearance: Casual and Fairly Groomed  Eye Contact:  Good  Speech:  Clear and Coherent  Volume:  Normal  Mood:  Euthymic  Affect:  Congruent  Thought Process:  Goal Directed  Orientation:  Full (Time, Place, and Person)  Thought Content: WDL   Suicidal Thoughts:  No  Homicidal Thoughts:  No  Memory:  Immediate;   Good Recent;   Good Remote;   Fair  Judgement:  Fair  Insight:  Fair  Psychomotor Activity:  Normal  Concentration:  Concentration: Good and Attention Span: Good  Recall:  Good  Fund of Knowledge: Good  Language: Good  Akathisia:  No  Handed:  Right  AIMS (if indicated): not done  Assets:  Communication Skills Desire for Improvement Resilience Social Support Talents/Skills  ADL's:  Intact  Cognition: WNL  Sleep:  Poor   Screenings: PHQ2-9     Office Visit from 10/23/2017 in [facility] Nutrition from 10/01/2017 in [facility] Office Visit from 09/11/2017 in [facility] Office Visit from 09/03/2017 in [facility]  PHQ-2 Total Score  0  0  0  0       Assessment and Plan: This patient is a 67 year old female with a history of depression anxiety and insomnia.  Her mood has improved on the combination of Zoloft 200 mg daily and rexulti 2 mg daily for augmentation.  We discussed at length how to improve her sleep hygiene and she claims that she will try.  At her  request the patient will return to see me in 6 months   Levonne Spiller, MD 01/24/2018, 10:20 AM

## 2018-01-27 ENCOUNTER — Encounter: Payer: Self-pay | Admitting: Nutrition

## 2018-01-27 ENCOUNTER — Ambulatory Visit: Payer: 59 | Admitting: "Endocrinology

## 2018-01-27 ENCOUNTER — Encounter: Payer: 59 | Attending: Pulmonary Disease | Admitting: Nutrition

## 2018-01-27 ENCOUNTER — Encounter: Payer: Self-pay | Admitting: "Endocrinology

## 2018-01-27 VITALS — Ht 62.0 in | Wt 140.0 lb

## 2018-01-27 VITALS — BP 136/65 | HR 64 | Ht 62.0 in | Wt 140.0 lb

## 2018-01-27 DIAGNOSIS — E118 Type 2 diabetes mellitus with unspecified complications: Secondary | ICD-10-CM | POA: Insufficient documentation

## 2018-01-27 DIAGNOSIS — IMO0002 Reserved for concepts with insufficient information to code with codable children: Secondary | ICD-10-CM

## 2018-01-27 DIAGNOSIS — E782 Mixed hyperlipidemia: Secondary | ICD-10-CM | POA: Diagnosis not present

## 2018-01-27 DIAGNOSIS — E1165 Type 2 diabetes mellitus with hyperglycemia: Secondary | ICD-10-CM

## 2018-01-27 MED ORDER — INSULIN GLARGINE 300 UNIT/ML ~~LOC~~ SOPN: 20.0000 [IU] | PEN_INJECTOR | Freq: Every day | SUBCUTANEOUS | 2 refills | Status: DC

## 2018-01-27 MED ORDER — INSULIN PEN NEEDLE 32G X 4 MM MISC: 1.0000 | Freq: Four times a day (QID) | 5 refills | Status: DC

## 2018-01-27 NOTE — Patient Instructions (Signed)
Goals 1. Continue to increase water intake to 5 bottles per day 2. Stop sucking on mints during day 3. Cut out sugared cereal 4. Increase and add 2 slices toast with breakfast. 5. Increase fresh fruits and vegetables

## 2018-01-27 NOTE — Progress Notes (Signed)
Subjective:    Patient ID: [name] [name], female    DOB: 03/31/1951.  she is being seen in f/u for management of currently uncontrolled symptomatic diabetes requested by  Sinda Du, MD.   Past Medical History:  Diagnosis Date  . Anxiety   . Constipation 03/23/2013  . Depression   . Diabetes mellitus type II   . Elevated cholesterol   . Helicobacter pylori gastritis 07/10/2017   Dx EGD -  1) Omeprazole 20 mg 2 times a day x 14 d 2) Pepto Bismol 2 tabs (262 mg each) 4 times a day x 14 d 3) Metronidazole 250 mg 4 times a day x 14 d 4) doxycycline 100 mg 2 times a day x 14 d  After 14 d stop omeprazole also  In 4 weeks after treatment completed do H. Pylori stool antigen - dx H. Pylori gastritis   . Hx of adenomatous colonic polyps 10/05/2015  . Hyperlipemia 02/21/09   NUC STRESS TEST-NORMAL- EF 74%  . Iron deficiency anemia   . MVP (mitral valve prolapse)   . Rectocele    Past Surgical History:  Procedure Laterality Date  . ABDOMINAL HYSTERECTOMY    . BILATERAL SALPINGOOPHORECTOMY    . carpel tunnel Bilateral 12/17/2009  . COLONOSCOPY    . ESOPHAGOGASTRODUODENOSCOPY     Social History   Socioeconomic History  . Marital status: Married    Spouse name: None  . Number of children: None  . Years of education: None  . Highest education level: None  Social Needs  . Financial resource strain: None  . Food insecurity - worry: None  . Food insecurity - inability: None  . Transportation needs - medical: None  . Transportation needs - non-medical: None  Occupational History  . None  Tobacco Use  . Smoking status: Former Smoker    Years: 30.00    Types: Cigarettes    Last attempt to quit: 09/03/1997    Years since quitting: 20.4  . Smokeless tobacco: Current User    Types: Snuff  . Tobacco comment: call when ready to quit  Substance and Sexual Activity  . Alcohol use: No    Alcohol/week: 0.0 oz  . Drug use: No  . Sexual activity: No    Birth control/protection:  Surgical  Other Topics Concern  . None  Social History Narrative   Married 2 sons   She is a housewife   No caffeine   09/23/2015   Outpatient Encounter Medications as of 01/27/2018  Medication Sig  . atenolol (TENORMIN) 50 MG tablet TAKE 1 TABLET (50 MG TOTAL) BY MOUTH 2 (TWO) TIMES DAILY.  Marland Kitchen BIOTIN PO Take 500 mg by mouth daily.   . Brexpiprazole (REXULTI) 2 MG TABS Take 2 mg by mouth daily.  Marland Kitchen CINNAMON PO Take 2,000 mg by mouth daily.   Marland Kitchen dicyclomine (BENTYL) 20 MG tablet Take 20 mg by mouth 3 (three) times daily.   . Insulin Glargine (TOUJEO SOLOSTAR) 300 UNIT/ML SOPN Inject 20 Units into the skin at bedtime.  . Insulin Pen Needle (BD PEN NEEDLE NANO U/F) 32G X 4 MM MISC 1 each by Does not apply route 4 (four) times daily.  . metFORMIN (GLUCOPHAGE) 500 MG tablet TAKE 1 TABLET (500 MG TOTAL) BY MOUTH 2 (TWO) TIMES DAILY WITH A MEAL.  Marland Kitchen sertraline (ZOLOFT) 100 MG tablet Take 1 tablet (100 mg total) by mouth 2 (two) times daily.  Marland Kitchen SITagliptin Phosphate (JANUVIA PO) Take 100 mg by mouth daily.  Marland Kitchen  WELCHOL 625 MG tablet Taking 6 Tablets daily  . ZETIA 10 MG tablet Take 10 mg by mouth daily.    No facility-administered encounter medications on file as of 01/27/2018.     ALLERGIES: Allergies  Allergen Reactions  . Codeine Other (See Comments)    Took it years ago and doesn't remember the reaction    VACCINATION STATUS: Immunization History  Administered Date(s) Administered  . Influenza,inj,Quad PF,6+ Mos 09/16/2014    Diabetes  She presents for her follow-up diabetic visit. She has type 2 diabetes mellitus. Onset time: She was diagnosed at approximate age of 67 years. Her disease course has been worsening (Her last 3 A1c's starting from the most recent: 10.4%, 9.7%, 8.5%.). There are no hypoglycemic associated symptoms. Pertinent negatives for hypoglycemia include no confusion, headaches, pallor or seizures. Associated symptoms include blurred vision, fatigue, polydipsia,  polyphagia and polyuria. Pertinent negatives for diabetes include no chest pain. There are no hypoglycemic complications. Symptoms are worsening. There are no diabetic complications. Risk factors for coronary artery disease include diabetes mellitus, dyslipidemia, sedentary lifestyle and tobacco exposure. Current diabetic treatment includes oral agent (dual therapy). Her weight is stable. She is following a generally unhealthy diet. When asked about meal planning, she reported none. She has not had a previous visit with a dietitian. She never participates in exercise. Her breakfast blood glucose range is generally >200 mg/dl. Her bedtime blood glucose range is generally >200 mg/dl. Her overall blood glucose range is >200 mg/dl. An ACE inhibitor/angiotensin II receptor blocker is not being taken. She does not see a podiatrist.Eye exam is current (She reports no retinopathy from a visit with ophthalmologist 2 years ago.).  Hyperlipidemia  This is a chronic problem. The current episode started more than 1 year ago. Exacerbating diseases include diabetes. Pertinent negatives include no chest pain, myalgias or shortness of breath. Current antihyperlipidemic treatment includes ezetimibe. Compliance problems include adherence to diet.  Risk factors for coronary artery disease include diabetes mellitus, dyslipidemia and a sedentary lifestyle.    Review of Systems  Constitutional: Positive for fatigue. Negative for chills, fever and unexpected weight change.  HENT: Negative for trouble swallowing and voice change.   Eyes: Positive for blurred vision. Negative for visual disturbance.  Respiratory: Negative for cough, shortness of breath and wheezing.   Cardiovascular: Negative for chest pain, palpitations and leg swelling.  Gastrointestinal: Negative for diarrhea, nausea and vomiting.  Endocrine: Positive for polydipsia, polyphagia and polyuria. Negative for cold intolerance and heat intolerance.   Musculoskeletal: Negative for arthralgias and myalgias.  Skin: Negative for color change, pallor, rash and wound.  Neurological: Negative for seizures and headaches.  Psychiatric/Behavioral: Negative for confusion and suicidal ideas.    Objective:    BP 136/65   Pulse 64   Ht 5\' 2"  (1.575 m)   Wt 140 lb (63.5 kg)   BMI 25.61 kg/m   Wt Readings from Last 3 Encounters:  01/27/18 140 lb (63.5 kg)  01/27/18 140 lb (63.5 kg)  10/23/17 137 lb (62.1 kg)     Physical Exam  Constitutional: She is oriented to person, place, and time. She appears well-developed.  HENT:  Head: Normocephalic and atraumatic.  Eyes: EOM are normal.  Neck: Normal range of motion. Neck supple. No tracheal deviation present. No thyromegaly present.  Cardiovascular: Normal rate and regular rhythm.  Pulmonary/Chest: Effort normal and breath sounds normal.  Abdominal: Soft. Bowel sounds are normal. There is no tenderness. There is no guarding.  Musculoskeletal: Normal range of motion.  She exhibits no edema.  Neurological: She is alert and oriented to person, place, and time. She has normal reflexes. No cranial nerve deficit. Coordination normal.  Skin: Skin is warm and dry. No rash noted. No erythema. No pallor.  Psychiatric: She has a normal mood and affect. Judgment normal.    CMP ( most recent) CMP     Component Value Date/Time   NA 137 01/20/2018 0921   K 4.1 01/20/2018 0921   CL 104 01/20/2018 0921   CO2 26 01/20/2018 0921   GLUCOSE 251 (H) 01/20/2018 0921   BUN 10 01/20/2018 0921   BUN 7 07/18/2015   CREATININE 0.78 01/20/2018 0921   CALCIUM 8.9 01/20/2018 0921   PROT 7.4 01/20/2018 0921   ALBUMIN 4.2 09/13/2014 0730   AST 35 01/20/2018 0921   ALT 21 01/20/2018 0921   ALKPHOS 52 09/13/2014 0730   BILITOT 0.6 01/20/2018 0921   GFRNONAA 80 01/20/2018 0921   GFRAA 93 01/20/2018 0921     Diabetic Labs (most recent): Lab Results  Component Value Date   HGBA1C 9.8 (H) 01/20/2018   HGBA1C  8.7 (H) 10/16/2017   HGBA1C 10.4 06/28/2017     Lipid Panel ( most recent) Lipid Panel     Component Value Date/Time   CHOL 224 (H) 09/13/2014 0730   TRIG 136 09/13/2014 0730   HDL 69 09/13/2014 0730   CHOLHDL 3.2 09/13/2014 0730   VLDL 27 09/13/2014 0730   LDLCALC 128 (H) 09/13/2014 0730     Assessment & Plan:   1. Uncontrolled type 2 diabetes mellitus with complication, without long-term current use of insulin ([place])  - Patient has currently uncontrolled symptomatic type 2 DM since  67 years of age. - She came with worsening glycemic profile, A1c increasing to 9.8% from 8.7%.    Recent labs reviewed.  - She does not report any gross complications, however, [name] [name] remains at a high risk for more acute and chronic complications which include CAD, CVA, CKD, retinopathy, and neuropathy. These are all discussed in detail with the patient.  - I have counseled her on diet management by adopting a carbohydrate restricted/protein rich diet.  -  Suggestion is made for her to avoid simple carbohydrates  from her diet including Cakes, Sweet Desserts / Pastries, Ice Cream, Soda (diet and regular), Sweet Tea, Candies, Chips, Cookies, Store Bought Juices, Alcohol in Excess of  1-2 drinks a day, Artificial Sweeteners, and "Sugar-free" Products. This will help patient to have stable blood glucose profile and potentially avoid unintended weight gain.   - I encouraged her to switch to  unprocessed or minimally processed complex starch and increased protein intake (animal or plant source), fruits, and vegetables.  - she is advised to stick to a routine mealtimes to eat 3 meals  a day and avoid unnecessary snacks ( to snack only to correct hypoglycemia).   - I have approached her with the following individualized plan to manage diabetes and patient agrees:   - Based on her presentation with hyperglycemia significantly above target and higher A1c of 9.8% from 8.7%, she is approached for at  least basal insulin.  She reluctantly accepts.  -I discussed and initiated Toujeo 20 units nightly associated with strict monitoring of blood glucose 4 times a day-before meals and at bedtime and for her to return in 2 weeks with her meter and logs for reevaluation.  - She has tolerated metformin, will continue at 500 mg by mouth twice a day. -  I will continue Januvia 100 mg by mouth daily, therapeutically suitable for patient .  -Patient is encouraged to call clinic for blood glucose levels less than 70 or above 300 mg /dl. - She is not a suitable candidate for  incretin therapy due to her body habitus.  - Patient specific target  A1c;  LDL, HDL, Triglycerides, and  Waist Circumference were discussed in detail.  2) BP/HTN: Her blood pressure is controlled to target.   Patient is not on ACEI/ARB. 3) Lipids/HPL: Her recent LDL showing uncontrolled status at 128.   I advised her to continue Zetia and welchol.  4)  Weight/Diet: CDE Consult is in progress , exercise, and detailed carbohydrates information provided.  5) Chronic Care/Health Maintenance:  -she  is not  on ACEI/ARB and Statin medications,   is encouraged to continue to follow up with Ophthalmology, Dentist,  Podiatrist at least yearly or according to recommendations, and advised to  stay away from smoking. I have recommended yearly flu vaccine and pneumonia vaccination at least every 5 years; moderate intensity exercise for up to 150 minutes weekly; and  sleep for at least 7 hours a day. - Time spent with the patient: 25 min, of which >50% was spent in reviewing her blood glucose logs , discussing her hypo- and hyper-glycemic episodes, reviewing her current and  previous labs and insulin doses and developing a plan to avoid hypo- and hyper-glycemia. Please refer to Patient Instructions for Blood Glucose Monitoring and Insulin/Medications Dosing Guide"  in media tab for additional information.  - I advised patient to maintain close follow  up with Sinda Du, MD for primary care needs.  Follow up plan: - Return in about 2 weeks (around 02/10/2018) for follow up with meter and logs- no labs. - Time spent with the patient: 25 min, of which >50% was spent in reviewing her sugar logs , discussing her hypo- and hyper-glycemic episodes, reviewing her current and  previous labs and insulin doses and developing a plan to avoid hypo- and hyper-glycemia.   Glade Lloyd, MD Phone: [phone]  Fax: [phone]   01/27/2018, 8:13 PM This note was partially dictated with voice recognition software. Similar sounding words can be transcribed inadequately or may not  be corrected upon review.

## 2018-01-27 NOTE — Progress Notes (Signed)
  Medical Nutrition Therapy:  Appt start time: 0930 end time:  1000 Assessment:  Primary concerns today: Diabets Type 2.  Sees  Dr. Dorris Fetch today. Metformin 500 mg BID and Januvia. FBS: BS 200-300's in am. Complains of being thirsty often, frequent urination and being tired. Still eating sugared cereal and craving sweets often. Drinks only 2 bottles of water a day.  Down to drinking 1 diet soda a day.  Diet is low in fresh fruit and vegetables.  A1C 9.8%, up from 8.7%.  Lab Results  Component Value Date   HGBA1C 9.8 (H) 01/20/2018   Preferred Learning Style:   No preference indicated   Learning Readiness:  Ready  Change in progress   MEDICATIONS: se list   DIETARY INTAKE:  24-hr recall:  B ( AM): Toast 2 slices and pb, water Snk ( AM):   L ( PM): [place] chicken, baked potatoes and steamed vegetables, water Snk ( PM):  Diet soda D ( PM):  Bowl of cereal-lucky charms. water Snk ( PM): orange Beverages: water,  Usual physical activity: walking  Estimated energy needs: 1500  calories 170 g carbohydrates 112 g protein 42 g fat  Progress Towards Goal(s):  In progress.   Nutritional Diagnosis:  NB-1.1 Food and nutrition-related knowledge deficit As related to Diabetes.  As evidenced by A1C 10.4%..    Intervention:  Nutrition and Diabetes education provided on My Plate, CHO counting, meal planning, portion sizes, timing of meals, avoiding snacks between meals unless having a low blood sugar, target ranges for A1C and blood sugars, signs/symptoms and treatment of hyper/hypoglycemia, monitoring blood sugars, taking medications as prescribed, benefits of exercising 30 minutes per day and prevention of complications of DM.   Goal 1. Continue to increase water intake to 5 bottles per day 2. Stop sucking on mints during day 3. Cut out sugared cereal and eat non sugared cereal. 4. Increase and add 2 slices toast with breakfast. 5. Increase fresh fruits and vegetables   Teaching  Method Utilized: Visual Auditory Hands on  Handouts given during visit include:  The Plate Method   Meal Plan Card   Barriers to learning/adherence to lifestyle change:  none  Demonstrated degree of understanding via:  Teach Back   Monitoring/Evaluation:  Dietary intake, exercise,  Meal planning, and body weight in  2-3 months. She may benefit from a low dose of long acting insulin to improve her DM.

## 2018-02-10 ENCOUNTER — Encounter: Payer: Self-pay | Admitting: "Endocrinology

## 2018-02-10 ENCOUNTER — Encounter: Payer: 59 | Attending: Pulmonary Disease | Admitting: Nutrition

## 2018-02-10 ENCOUNTER — Ambulatory Visit: Payer: 59 | Admitting: "Endocrinology

## 2018-02-10 VITALS — BP 146/70 | HR 70 | Ht 62.0 in | Wt 140.0 lb

## 2018-02-10 DIAGNOSIS — E1165 Type 2 diabetes mellitus with hyperglycemia: Secondary | ICD-10-CM | POA: Insufficient documentation

## 2018-02-10 DIAGNOSIS — E118 Type 2 diabetes mellitus with unspecified complications: Secondary | ICD-10-CM | POA: Insufficient documentation

## 2018-02-10 DIAGNOSIS — I10 Essential (primary) hypertension: Secondary | ICD-10-CM | POA: Diagnosis not present

## 2018-02-10 DIAGNOSIS — IMO0002 Reserved for concepts with insufficient information to code with codable children: Secondary | ICD-10-CM

## 2018-02-10 DIAGNOSIS — Z713 Dietary counseling and surveillance: Secondary | ICD-10-CM | POA: Diagnosis not present

## 2018-02-10 DIAGNOSIS — E782 Mixed hyperlipidemia: Secondary | ICD-10-CM

## 2018-02-10 MED ORDER — INSULIN GLARGINE 300 UNIT/ML ~~LOC~~ SOPN: 30.0000 [IU] | PEN_INJECTOR | Freq: Every day | SUBCUTANEOUS | 2 refills | Status: DC

## 2018-02-10 MED ORDER — LISINOPRIL 10 MG PO TABS: 10.0000 mg | ORAL_TABLET | Freq: Every day | ORAL | 3 refills | Status: DC

## 2018-02-10 NOTE — Progress Notes (Signed)
  Medical Nutrition Therapy:  Appt start time: 1030 end time:  1100 Assessment:  Primary concerns today: Diabets Type 2.  Hasn't changed much at all  She notes she hasn't been focused in managing her diabetes. BS are still running 200's. Saw Dr. Dorris Fetch today. Toujeo increased to 30 units a day, Januvia and Metformin 500 mg BID. Stays with dry mouth Says she craves ice all the time, and has no energy.Marland Kitchen Has been anemic in the past and was on iron. Advised to contact PCP to discuss rechecking for anemia and start treatment.   Current diet is still inconsistent to meet her needs and improve her DM.  Lab Results  Component Value Date   HGBA1C 9.8 (H) 01/20/2018   Preferred Learning Style:   No preference indicated   Learning Readiness:  Ready  Change in progress   MEDICATIONS: se list   DIETARY INTAKE:  24-hr recall:  B ( AM): Toast 2 slices and pb, water Snk ( AM):   L ( PM):Greek chicken and baked potatoes and steamed vegetables, water Snk ( PM):  misc yogurt  D ( PM):  Left overs from lunch. Snk ( PM): misc junk food. Beverages: water,  Usual physical activity: walking  Estimated energy needs: 1500  calories 170 g carbohydrates 112 g protein 42 g fat  Progress Towards Goal(s):  In progress.   Nutritional Diagnosis:  NB-1.1 Food and nutrition-related knowledge deficit As related to Diabetes.  As evidenced by A1C 10.4%..    Intervention:  Nutrition and Diabetes education provided on My Plate, CHO counting, meal planning, portion sizes, timing of meals, avoiding snacks between meals unless having a low blood sugar, target ranges for A1C and blood sugars, signs/symptoms and treatment of hyper/hypoglycemia, monitoring blood sugars, taking medications as prescribed, benefits of exercising 30 minutes per day and prevention of complications of DM.   Goals Drink 4 bottles of water per day. INcrease vegetables and fruit with meals. Contact Dr. Luan Pulling to get blood work for  possible anemia and ask about taking iron pills.   Teaching Method Utilized: Visual Auditory Hands on  Handouts given during visit include:  The Plate Method   Meal Plan Card   Barriers to learning/adherence to lifestyle change:  none  Demonstrated degree of understanding via:  Teach Back   Monitoring/Evaluation:  Dietary intake, exercise,  Meal planning, and body weight in  2-3 months. She may need to be evaluated for possible anemia.

## 2018-02-10 NOTE — Progress Notes (Signed)
Subjective:    Patient ID: [name], female    DOB: 1951/07/12.  she is being seen in follow-up for management of currently uncontrolled symptomatic diabetes requested by  Sinda Du, MD.   Past Medical History:  Diagnosis Date  . Anxiety   . Constipation 03/23/2013  . Depression   . Diabetes mellitus type II   . Elevated cholesterol   . Helicobacter pylori gastritis 07/10/2017   Dx EGD -  1) Omeprazole 20 mg 2 times a day x 14 d 2) Pepto Bismol 2 tabs (262 mg each) 4 times a day x 14 d 3) Metronidazole 250 mg 4 times a day x 14 d 4) doxycycline 100 mg 2 times a day x 14 d  After 14 d stop omeprazole also  In 4 weeks after treatment completed do H. Pylori stool antigen - dx H. Pylori gastritis   . Hx of adenomatous colonic polyps 10/05/2015  . Hyperlipemia 02/21/09   NUC STRESS TEST-NORMAL- EF 74%  . Iron deficiency anemia   . MVP (mitral valve prolapse)   . Rectocele    Past Surgical History:  Procedure Laterality Date  . ABDOMINAL HYSTERECTOMY    . BILATERAL SALPINGOOPHORECTOMY    . carpel tunnel Bilateral 12/17/2009  . COLONOSCOPY    . ESOPHAGOGASTRODUODENOSCOPY     Social History   Socioeconomic History  . Marital status: Married    Spouse name: None  . Number of children: None  . Years of education: None  . Highest education level: None  Social Needs  . Financial resource strain: None  . Food insecurity - worry: None  . Food insecurity - inability: None  . Transportation needs - medical: None  . Transportation needs - non-medical: None  Occupational History  . None  Tobacco Use  . Smoking status: Former Smoker    Years: 30.00    Types: Cigarettes    Last attempt to quit: 09/03/1997    Years since quitting: 20.4  . Smokeless tobacco: Current User    Types: Snuff  . Tobacco comment: call when ready to quit  Substance and Sexual Activity  . Alcohol use: No    Alcohol/week: 0.0 oz  . Drug use: No  . Sexual activity: No    Birth control/protection:  Surgical  Other Topics Concern  . None  Social History Narrative   Married 2 sons   She is a housewife   No caffeine   09/23/2015   Outpatient Encounter Medications as of 02/10/2018  Medication Sig  . atenolol (TENORMIN) 50 MG tablet TAKE 1 TABLET (50 MG TOTAL) BY MOUTH 2 (TWO) TIMES DAILY.  Marland Kitchen BIOTIN PO Take 500 mg by mouth daily.   . Brexpiprazole (REXULTI) 2 MG TABS Take 2 mg by mouth daily.  Marland Kitchen CINNAMON PO Take 2,000 mg by mouth daily.   Marland Kitchen dicyclomine (BENTYL) 20 MG tablet Take 20 mg by mouth 3 (three) times daily.   . Insulin Glargine (TOUJEO SOLOSTAR) 300 UNIT/ML SOPN Inject 20 Units into the skin at bedtime.  . Insulin Pen Needle (BD PEN NEEDLE NANO U/F) 32G X 4 MM MISC 1 each by Does not apply route 4 (four) times daily.  . metFORMIN (GLUCOPHAGE) 500 MG tablet TAKE 1 TABLET (500 MG TOTAL) BY MOUTH 2 (TWO) TIMES DAILY WITH A MEAL.  Marland Kitchen sertraline (ZOLOFT) 100 MG tablet Take 1 tablet (100 mg total) by mouth 2 (two) times daily.  Marland Kitchen SITagliptin Phosphate (JANUVIA PO) Take 100 mg by mouth daily.  Marland Kitchen  WELCHOL 625 MG tablet Taking 6 Tablets daily  . ZETIA 10 MG tablet Take 10 mg by mouth daily.    No facility-administered encounter medications on file as of 02/10/2018.     ALLERGIES: Allergies  Allergen Reactions  . Codeine Other (See Comments)    Took it years ago and doesn't remember the reaction    VACCINATION STATUS: Immunization History  Administered Date(s) Administered  . Influenza,inj,Quad PF,6+ Mos 09/16/2014    Diabetes  She presents for her follow-up diabetic visit. She has type 2 diabetes mellitus. Onset time: She was diagnosed at approximate age of 66 years. Her disease course has been worsening (Her last 3 A1c's starting from the most recent: 10.4%, 9.7%, 8.5%.). There are no hypoglycemic associated symptoms. Pertinent negatives for hypoglycemia include no confusion, headaches, pallor or seizures. Associated symptoms include blurred vision, fatigue, polydipsia,  polyphagia and polyuria. Pertinent negatives for diabetes include no chest pain. There are no hypoglycemic complications. Symptoms are improving. There are no diabetic complications. Risk factors for coronary artery disease include diabetes mellitus, dyslipidemia, sedentary lifestyle and tobacco exposure. Current diabetic treatment includes oral agent (dual therapy). Her weight is stable. She is following a generally unhealthy diet. When asked about meal planning, she reported none. She has not had a previous visit with a dietitian. She never participates in exercise. Her breakfast blood glucose range is generally >200 mg/dl. Her lunch blood glucose range is generally 180-200 mg/dl. Her dinner blood glucose range is generally 180-200 mg/dl. Her bedtime blood glucose range is generally 180-200 mg/dl. Her overall blood glucose range is 180-200 mg/dl. An ACE inhibitor/angiotensin II receptor blocker is not being taken. She does not see a podiatrist.Eye exam is current (She reports no retinopathy from a visit with ophthalmologist 2 years ago.).  Hyperlipidemia  This is a chronic problem. The current episode started more than 1 year ago. Exacerbating diseases include diabetes. Pertinent negatives include no chest pain, myalgias or shortness of breath. Current antihyperlipidemic treatment includes ezetimibe. Compliance problems include adherence to diet.  Risk factors for coronary artery disease include diabetes mellitus, dyslipidemia and a sedentary lifestyle.    Review of Systems  Constitutional: Positive for fatigue. Negative for chills, fever and unexpected weight change.  HENT: Negative for trouble swallowing and voice change.   Eyes: Positive for blurred vision. Negative for visual disturbance.  Respiratory: Negative for cough, shortness of breath and wheezing.   Cardiovascular: Negative for chest pain, palpitations and leg swelling.  Gastrointestinal: Negative for diarrhea, nausea and vomiting.   Endocrine: Positive for polydipsia, polyphagia and polyuria. Negative for cold intolerance and heat intolerance.  Musculoskeletal: Negative for arthralgias and myalgias.  Skin: Negative for color change, pallor, rash and wound.  Neurological: Negative for seizures and headaches.  Psychiatric/Behavioral: Negative for confusion and suicidal ideas.    Objective:    BP (!) 146/70   Pulse 70   Ht 5\' 2"  (1.575 m)   Wt 140 lb (63.5 kg)   BMI 25.61 kg/m   Wt Readings from Last 3 Encounters:  02/10/18 140 lb (63.5 kg)  01/27/18 140 lb (63.5 kg)  01/27/18 140 lb (63.5 kg)     Physical Exam  Constitutional: She is oriented to person, place, and time. She appears well-developed.  HENT:  Head: Normocephalic and atraumatic.  Eyes: EOM are normal.  Neck: Normal range of motion. Neck supple. No tracheal deviation present. No thyromegaly present.  Cardiovascular: Normal rate and regular rhythm.  Pulmonary/Chest: Effort normal and breath sounds normal.  Abdominal:  Soft. Bowel sounds are normal. There is no tenderness. There is no guarding.  Musculoskeletal: Normal range of motion. She exhibits no edema.  Neurological: She is alert and oriented to person, place, and time. She has normal reflexes. No cranial nerve deficit. Coordination normal.  Skin: Skin is warm and dry. No rash noted. No erythema. No pallor.  Psychiatric: She has a normal mood and affect. Judgment normal.    CMP ( most recent) CMP     Component Value Date/Time   NA 137 01/20/2018 0921   K 4.1 01/20/2018 0921   CL 104 01/20/2018 0921   CO2 26 01/20/2018 0921   GLUCOSE 251 (H) 01/20/2018 0921   BUN 10 01/20/2018 0921   BUN 7 07/18/2015   CREATININE 0.78 01/20/2018 0921   CALCIUM 8.9 01/20/2018 0921   PROT 7.4 01/20/2018 0921   ALBUMIN 4.2 09/13/2014 0730   AST 35 01/20/2018 0921   ALT 21 01/20/2018 0921   ALKPHOS 52 09/13/2014 0730   BILITOT 0.6 01/20/2018 0921   GFRNONAA 80 01/20/2018 0921   GFRAA 93 01/20/2018  0921     Diabetic Labs (most recent): Lab Results  Component Value Date   HGBA1C 9.8 (H) 01/20/2018   HGBA1C 8.7 (H) 10/16/2017   HGBA1C 10.4 06/28/2017     Lipid Panel ( most recent) Lipid Panel     Component Value Date/Time   CHOL 224 (H) 09/13/2014 0730   TRIG 136 09/13/2014 0730   HDL 69 09/13/2014 0730   CHOLHDL 3.2 09/13/2014 0730   VLDL 27 09/13/2014 0730   LDLCALC 128 (H) 09/13/2014 0730     Assessment & Plan:   1. Uncontrolled type 2 diabetes mellitus with complication, without long-term current use of insulin ([place])  - Patient has currently uncontrolled symptomatic type 2 DM since  67 years of age. - She came with still above target fasting blood glucose profile, improving postprandial blood glucose profile.  Her recent A1c was high at 9.8%.       Recent labs reviewed.  - She does not report any gross complications, however, DEBBORAH ALONGE remains at a high risk for more acute and chronic complications which include CAD, CVA, CKD, retinopathy, and neuropathy. These are all discussed in detail with the patient.  - I have counseled her on diet management by adopting a carbohydrate restricted/protein rich diet.  -  Suggestion is made for her to avoid simple carbohydrates  from her diet including Cakes, Sweet Desserts / Pastries, Ice Cream, Soda (diet and regular), Sweet Tea, Candies, Chips, Cookies, Store Bought Juices, Alcohol in Excess of  1-2 drinks a day, Artificial Sweeteners, and "Sugar-free" Products. This will help patient to have stable blood glucose profile and potentially avoid unintended weight gain.  - I encouraged her to switch to  unprocessed or minimally processed complex starch and increased protein intake (animal or plant source), fruits, and vegetables.  - she is advised to stick to a routine mealtimes to eat 3 meals  a day and avoid unnecessary snacks ( to snack only to correct hypoglycemia).   - I have approached her with the following  individualized plan to manage diabetes and patient agrees:   - Based on her presentation with significant fasting hyperglycemia , she will need higher dose of basal insulin.   -She is getting more comfortable to using insulin. -I discussed and increased Toujeo to 30 units nightly associated with strict monitoring of blood glucose 2 times a day-before breakfast and at bedtime . -  She has tolerated metformin, will continue at 500 mg by mouth twice a day. - I will continue Januvia 100 mg by mouth daily, therapeutically suitable for patient .  -Patient is encouraged to call clinic for blood glucose levels less than 70 or above 300 mg /dl. - She is not a suitable candidate for GLP-1RA therapy due to her body habitus.  - Patient specific target  A1c;  LDL, HDL, Triglycerides, and  Waist Circumference were discussed in detail.  2) BP/HTN: Her blood pressure is controlled to target.   Patient is not on ACEI/ARB.  I will initiate low-dose lisinopril therapy for her. 3) Lipids/HPL: Her recent LDL showing uncontrolled status at 128.   I advised her to continue Zetia and welchol.  4)  Weight/Diet: CDE Consult is in progress , exercise, and detailed carbohydrates information provided.  5) Chronic Care/Health Maintenance:  -she  is not  on ACEI/ARB and Statin medications,   is encouraged to continue to follow up with Ophthalmology, Dentist,  Podiatrist at least yearly or according to recommendations, and advised to  stay away from smoking. I have recommended yearly flu vaccine and pneumonia vaccination at least every 5 years; moderate intensity exercise for up to 150 minutes weekly; and  sleep for at least 7 hours a day.  - Time spent with the patient: 25 min, of which >50% was spent in reviewing her blood glucose logs , discussing her hypo- and hyper-glycemic episodes, reviewing her current and  previous labs and insulin doses and developing a plan to avoid hypo- and hyper-glycemia. Please refer to Patient  Instructions for Blood Glucose Monitoring and Insulin/Medications Dosing Guide"  in media tab for additional information.   - I advised patient to maintain close follow up with Sinda Du, MD for primary care needs.  Follow up plan: - Return in about 10 weeks (around 04/21/2018) for follow up with pre-visit labs, meter, and logs. - Time spent with the patient: 25 min, of which >50% was spent in reviewing her sugar logs , discussing her hypo- and hyper-glycemic episodes, reviewing her current and  previous labs and insulin doses and developing a plan to avoid hypo- and hyper-glycemia.   Glade Lloyd, MD Phone: [phone]  Fax: [phone]   02/10/2018, 10:21 AM This note was partially dictated with voice recognition software. Similar sounding words can be transcribed inadequately or may not  be corrected upon review.

## 2018-02-10 NOTE — Patient Instructions (Signed)

## 2018-02-10 NOTE — Patient Instructions (Addendum)
Goals Drink 4 bottles of water per day. INcrease vegetables and fruit with meals. Contact Dr. Luan Pulling to get blood work for possible anemia and ask about taking iron pills.

## 2018-02-12 ENCOUNTER — Encounter: Payer: Self-pay | Admitting: Nutrition

## 2018-02-18 LAB — IRON,TIBC AND FERRITIN PANEL: Ferritin: 19

## 2018-03-08 ENCOUNTER — Other Ambulatory Visit: Payer: Self-pay | Admitting: "Endocrinology

## 2018-04-16 LAB — COMPLETE METABOLIC PANEL WITH GFR
AG Ratio: 1.4 (calc) (ref 1.0–2.5)
ALBUMIN MSPROF: 4.2 g/dL (ref 3.6–5.1)
ALT: 20 U/L (ref 6–29)
AST: 33 U/L (ref 10–35)
Alkaline phosphatase (APISO): 50 U/L (ref 33–130)
BILIRUBIN TOTAL: 0.6 mg/dL (ref 0.2–1.2)
BUN: 12 mg/dL (ref 7–25)
CHLORIDE: 103 mmol/L (ref 98–110)
CO2: 28 mmol/L (ref 20–32)
CREATININE: 0.79 mg/dL (ref 0.50–0.99)
Calcium: 9.1 mg/dL (ref 8.6–10.4)
GFR, EST AFRICAN AMERICAN: 92 mL/min/{1.73_m2} (ref >=60)
GFR, Est Non African American: 79 mL/min/{1.73_m2} (ref >=60)
GLOBULIN: 3 g/dL (ref 1.9–3.7)
GLUCOSE: 193 mg/dL — AB (ref 65–99)
Potassium: 4.2 mmol/L (ref 3.5–5.3)
SODIUM: 139 mmol/L (ref 135–146)
TOTAL PROTEIN: 7.2 g/dL (ref 6.1–8.1)

## 2018-04-16 LAB — T4, FREE: FREE T4: 1 ng/dL (ref 0.8–1.8)

## 2018-04-16 LAB — TSH: TSH: 1.32 mIU/L (ref 0.40–4.50)

## 2018-04-16 LAB — LIPID PANEL
CHOL/HDL RATIO: 2.9 (calc) (ref <5.0)
Cholesterol: 188 mg/dL (ref <200)
HDL: 64 mg/dL (ref >50)
LDL CHOLESTEROL (CALC): 105 mg/dL — AB
Non-HDL Cholesterol (Calc): 124 mg/dL (calc) (ref <130)
Triglycerides: 101 mg/dL (ref <150)

## 2018-04-17 LAB — HEMOGLOBIN A1C
Hgb A1c MFr Bld: 8.8 % of total Hgb — ABNORMAL HIGH (ref <5.7)
Mean Plasma Glucose: 206 (calc)
eAG (mmol/L): 11.4 (calc)

## 2018-04-17 LAB — VITAMIN D 25 HYDROXY (VIT D DEFICIENCY, FRACTURES): VIT D 25 HYDROXY: 26 ng/mL — AB (ref 30–100)

## 2018-04-23 ENCOUNTER — Encounter: Payer: Self-pay | Admitting: Nutrition

## 2018-04-23 ENCOUNTER — Ambulatory Visit: Payer: 59 | Admitting: "Endocrinology

## 2018-04-23 ENCOUNTER — Encounter: Payer: Self-pay | Admitting: "Endocrinology

## 2018-04-23 ENCOUNTER — Encounter: Payer: 59 | Attending: Pulmonary Disease | Admitting: Nutrition

## 2018-04-23 VITALS — Ht 62.0 in | Wt 143.0 lb

## 2018-04-23 VITALS — BP 135/65 | HR 65 | Ht 62.0 in | Wt 143.0 lb

## 2018-04-23 DIAGNOSIS — E1165 Type 2 diabetes mellitus with hyperglycemia: Secondary | ICD-10-CM

## 2018-04-23 DIAGNOSIS — E118 Type 2 diabetes mellitus with unspecified complications: Secondary | ICD-10-CM | POA: Diagnosis not present

## 2018-04-23 DIAGNOSIS — I10 Essential (primary) hypertension: Secondary | ICD-10-CM | POA: Diagnosis not present

## 2018-04-23 DIAGNOSIS — Z713 Dietary counseling and surveillance: Secondary | ICD-10-CM | POA: Diagnosis not present

## 2018-04-23 DIAGNOSIS — E782 Mixed hyperlipidemia: Secondary | ICD-10-CM | POA: Diagnosis not present

## 2018-04-23 DIAGNOSIS — IMO0002 Reserved for concepts with insufficient information to code with codable children: Secondary | ICD-10-CM

## 2018-04-23 DIAGNOSIS — Z794 Long term (current) use of insulin: Secondary | ICD-10-CM | POA: Diagnosis not present

## 2018-04-23 DIAGNOSIS — Z6826 Body mass index (BMI) 26.0-26.9, adult: Secondary | ICD-10-CM | POA: Diagnosis not present

## 2018-04-23 MED ORDER — SITAGLIPTIN PHOSPHATE 50 MG PO TABS: 100.0000 mg | ORAL_TABLET | Freq: Every day | ORAL | 6 refills | Status: DC

## 2018-04-23 MED ORDER — INSULIN GLARGINE 300 UNIT/ML ~~LOC~~ SOPN: 40.0000 [IU] | PEN_INJECTOR | Freq: Every day | SUBCUTANEOUS | 2 refills | Status: DC

## 2018-04-23 NOTE — Patient Instructions (Signed)

## 2018-04-23 NOTE — Progress Notes (Signed)
Subjective:    Patient ID: [name] [name], female    DOB: 1951/01/18.  she is being seen in follow-up for management of currently uncontrolled symptomatic diabetes requested by  Sinda Du, MD.   Past Medical History:  Diagnosis Date  . Anxiety   . Constipation 03/23/2013  . Depression   . Diabetes mellitus type II   . Elevated cholesterol   . Helicobacter pylori gastritis 07/10/2017   Dx EGD -  1) Omeprazole 20 mg 2 times a day x 14 d 2) Pepto Bismol 2 tabs (262 mg each) 4 times a day x 14 d 3) Metronidazole 250 mg 4 times a day x 14 d 4) doxycycline 100 mg 2 times a day x 14 d  After 14 d stop omeprazole also  In 4 weeks after treatment completed do H. Pylori stool antigen - dx H. Pylori gastritis   . Hx of adenomatous colonic polyps 10/05/2015  . Hyperlipemia 02/21/09   NUC STRESS TEST-NORMAL- EF 74%  . Iron deficiency anemia   . MVP (mitral valve prolapse)   . Rectocele    Past Surgical History:  Procedure Laterality Date  . ABDOMINAL HYSTERECTOMY    . BILATERAL SALPINGOOPHORECTOMY    . carpel tunnel Bilateral 12/17/2009  . COLONOSCOPY    . ESOPHAGOGASTRODUODENOSCOPY     Social History   Socioeconomic History  . Marital status: Married    Spouse name: Not on file  . Number of children: Not on file  . Years of education: Not on file  . Highest education level: Not on file  Occupational History  . Not on file  Social Needs  . Financial resource strain: Not on file  . Food insecurity:    Worry: Not on file    Inability: Not on file  . Transportation needs:    Medical: Not on file    Non-medical: Not on file  Tobacco Use  . Smoking status: Former Smoker    Years: 30.00    Types: Cigarettes    Last attempt to quit: 09/03/1997    Years since quitting: 20.6  . Smokeless tobacco: Current User    Types: Snuff  . Tobacco comment: call when ready to quit  Substance and Sexual Activity  . Alcohol use: No    Alcohol/week: 0.0 oz  . Drug use: No  . Sexual  activity: Never    Birth control/protection: Surgical  Lifestyle  . Physical activity:    Days per week: Not on file    Minutes per session: Not on file  . Stress: Not on file  Relationships  . Social connections:    Talks on phone: Not on file    Gets together: Not on file    Attends religious service: Not on file    Active member of club or organization: Not on file    Attends meetings of clubs or organizations: Not on file    Relationship status: Not on file  Other Topics Concern  . Not on file  Social History Narrative   Married 2 sons   She is a housewife   No caffeine   09/23/2015   Outpatient Encounter Medications as of 04/23/2018  Medication Sig  . atenolol (TENORMIN) 50 MG tablet TAKE 1 TABLET (50 MG TOTAL) BY MOUTH 2 (TWO) TIMES DAILY.  Marland Kitchen BIOTIN PO Take 500 mg by mouth daily.   . Brexpiprazole (REXULTI) 2 MG TABS Take 2 mg by mouth daily.  Marland Kitchen CINNAMON PO Take 2,000 mg by mouth  daily.   . dicyclomine (BENTYL) 20 MG tablet Take 20 mg by mouth 3 (three) times daily.   . Insulin Glargine (TOUJEO SOLOSTAR) 300 UNIT/ML SOPN Inject 40 Units into the skin at bedtime.  . Insulin Pen Needle (BD PEN NEEDLE NANO U/F) 32G X 4 MM MISC 1 each by Does not apply route 4 (four) times daily.  Marland Kitchen lisinopril (PRINIVIL,ZESTRIL) 10 MG tablet Take 1 tablet (10 mg total) by mouth daily.  . metFORMIN (GLUCOPHAGE) 500 MG tablet TAKE 1 TABLET (500 MG TOTAL) BY MOUTH 2 (TWO) TIMES DAILY WITH A MEAL.  Marland Kitchen sertraline (ZOLOFT) 100 MG tablet Take 1 tablet (100 mg total) by mouth 2 (two) times daily.  . sitaGLIPtin (JANUVIA) 50 MG tablet Take 2 tablets (100 mg total) by mouth daily.  Earnestine Mealing 625 MG tablet Taking 6 Tablets daily  . ZETIA 10 MG tablet Take 10 mg by mouth daily.   . [DISCONTINUED] Insulin Glargine (TOUJEO SOLOSTAR) 300 UNIT/ML SOPN Inject 30 Units into the skin at bedtime.  . [DISCONTINUED] SITagliptin Phosphate (JANUVIA PO) Take 100 mg by mouth daily.   No facility-administered encounter  medications on file as of 04/23/2018.     ALLERGIES: Allergies  Allergen Reactions  . Codeine Other (See Comments)    Took it years ago and doesn't remember the reaction    VACCINATION STATUS: Immunization History  Administered Date(s) Administered  . Influenza,inj,Quad PF,6+ Mos 09/16/2014    Diabetes  She presents for her follow-up diabetic visit. She has type 2 diabetes mellitus. Onset time: She was diagnosed at approximate age of 66 years. Her disease course has been improving (Her last 3 A1c's starting from the most recent: 10.4%, 9.7%, 8.5%.). There are no hypoglycemic associated symptoms. Pertinent negatives for hypoglycemia include no confusion, headaches, pallor or seizures. Associated symptoms include fatigue. Pertinent negatives for diabetes include no blurred vision, no chest pain, no polydipsia, no polyphagia and no polyuria. There are no hypoglycemic complications. Symptoms are improving. There are no diabetic complications. Risk factors for coronary artery disease include diabetes mellitus, dyslipidemia, sedentary lifestyle and tobacco exposure. Current diabetic treatment includes oral agent (dual therapy). Her weight is stable. She is following a generally unhealthy diet. When asked about meal planning, she reported none. She has not had a previous visit with a dietitian. She never participates in exercise. Her breakfast blood glucose range is generally 140-180 mg/dl. Her bedtime blood glucose range is generally 180-200 mg/dl. Her overall blood glucose range is 180-200 mg/dl. An ACE inhibitor/angiotensin II receptor blocker is not being taken. She does not see a podiatrist.Eye exam is current (She reports no retinopathy from a visit with ophthalmologist 2 years ago.).  Hyperlipidemia  This is a chronic problem. The current episode started more than 1 year ago. Exacerbating diseases include diabetes. Pertinent negatives include no chest pain, myalgias or shortness of breath. Current  antihyperlipidemic treatment includes ezetimibe. Compliance problems include adherence to diet.  Risk factors for coronary artery disease include diabetes mellitus, dyslipidemia and a sedentary lifestyle.    Review of Systems  Constitutional: Positive for fatigue. Negative for chills, fever and unexpected weight change.  HENT: Negative for trouble swallowing and voice change.   Eyes: Negative for blurred vision and visual disturbance.  Respiratory: Negative for cough, shortness of breath and wheezing.   Cardiovascular: Negative for chest pain, palpitations and leg swelling.  Gastrointestinal: Negative for diarrhea, nausea and vomiting.  Endocrine: Negative for cold intolerance, heat intolerance, polydipsia, polyphagia and polyuria.  Musculoskeletal: Negative for  arthralgias and myalgias.  Skin: Negative for color change, pallor, rash and wound.  Neurological: Negative for seizures and headaches.  Psychiatric/Behavioral: Negative for confusion and suicidal ideas.    Objective:    BP 135/65   Pulse 65   Ht 5\' 2"  (1.575 m)   Wt 143 lb (64.9 kg)   BMI 26.16 kg/m   Wt Readings from Last 3 Encounters:  04/23/18 143 lb (64.9 kg)  04/23/18 143 lb (64.9 kg)  02/10/18 140 lb (63.5 kg)     Physical Exam  Constitutional: She is oriented to person, place, and time. She appears well-developed.  HENT:  Head: Normocephalic and atraumatic.  Eyes: EOM are normal.  Neck: Normal range of motion. Neck supple. No tracheal deviation present. No thyromegaly present.  Cardiovascular: Normal rate.  Pulmonary/Chest: Effort normal.  Abdominal: There is no tenderness. There is no guarding.  Musculoskeletal: Normal range of motion. She exhibits no edema.  Neurological: She is alert and oriented to person, place, and time. She has normal reflexes. No cranial nerve deficit. Coordination normal.  Skin: Skin is warm and dry. No rash noted. No erythema. No pallor.  Psychiatric: She has a normal mood and  affect. Judgment normal.    CMP ( most recent) CMP     Component Value Date/Time   NA 139 04/16/2018 0732   K 4.2 04/16/2018 0732   CL 103 04/16/2018 0732   CO2 28 04/16/2018 0732   GLUCOSE 193 (H) 04/16/2018 0732   BUN 12 04/16/2018 0732   BUN 7 07/18/2015   CREATININE 0.79 04/16/2018 0732   CALCIUM 9.1 04/16/2018 0732   PROT 7.2 04/16/2018 0732   ALBUMIN 4.2 09/13/2014 0730   AST 33 04/16/2018 0732   ALT 20 04/16/2018 0732   ALKPHOS 52 09/13/2014 0730   BILITOT 0.6 04/16/2018 0732   GFRNONAA 79 04/16/2018 0732   GFRAA 92 04/16/2018 0732     Diabetic Labs (most recent): Lab Results  Component Value Date   HGBA1C 8.8 (H) 04/16/2018   HGBA1C 9.8 (H) 01/20/2018   HGBA1C 8.7 (H) 10/16/2017     Lipid Panel ( most recent) Lipid Panel     Component Value Date/Time   CHOL 188 04/16/2018 0732   TRIG 101 04/16/2018 0732   HDL 64 04/16/2018 0732   CHOLHDL 2.9 04/16/2018 0732   VLDL 27 09/13/2014 0730   LDLCALC 105 (H) 04/16/2018 0732     Assessment & Plan:   1. Uncontrolled type 2 diabetes mellitus with complication, without long-term current use of insulin ([place])  - Patient has currently uncontrolled symptomatic type 2 DM since  67 years of age. - She came with improved blood glucose profile, A1c improving to 8.8% from 9.8%.      Recent labs reviewed.  - She does not report any gross complications, however, DEZAREE TRACEY remains at a high risk for more acute and chronic complications which include CAD, CVA, CKD, retinopathy, and neuropathy. These are all discussed in detail with the patient.  - I have counseled her on diet management by adopting a carbohydrate restricted/protein rich diet.  -  Suggestion is made for her to avoid simple carbohydrates  from her diet including Cakes, Sweet Desserts / Pastries, Ice Cream, Soda (diet and regular), Sweet Tea, Candies, Chips, Cookies, Store Bought Juices, Alcohol in Excess of  1-2 drinks a day, Artificial Sweeteners, and  "Sugar-free" Products. This will help patient to have stable blood glucose profile and potentially avoid unintended weight gain.  -  I encouraged her to switch to  unprocessed or minimally processed complex starch and increased protein intake (animal or plant source), fruits, and vegetables.  - she is advised to stick to a routine mealtimes to eat 3 meals  a day and avoid unnecessary snacks ( to snack only to correct hypoglycemia).   - I have approached her with the following individualized plan to manage diabetes and patient agrees:   -She presents with improved but still significantly above target glucose profile both fasting and postprandial.   -I advised her to increase her Lantus to 40 units nightly, associated with strict monitoring of blood glucose 2 times a day-before breakfast and at bedtime . - She has tolerated metformin, will continue at 500 mg by mouth twice a day. - I discussed and lowered her Januvia to 50 mg p.o. daily with breakfast after she finishes her current supply of Januvia 100 mg.    -Patient is encouraged to call clinic for blood glucose levels less than 70 or above 300 mg /dl. - She is not a suitable candidate for GLP-1RA therapy due to her body habitus.  - Patient specific target  A1c;  LDL, HDL, Triglycerides, and  Waist Circumference were discussed in detail.  2) BP/HTN: Her blood pressure is controlled to target.  She has responded to low-dose lisinopril with better control of high blood pressure.  I advised her to continue lisinopril 10 mg p.o. daily with breakfast.  3) Lipids/HPL: Her recent LDL showing uncontrolled status at 128.  She does not tolerate statins.  I advised her to continue Zetia and welchol.  4)  Weight/Diet: CDE Consult is in progress , exercise, and detailed carbohydrates information provided.  5) Chronic Care/Health Maintenance:  -she  is not  on ACEI/ARB and Statin medications,   is encouraged to continue to follow up with Ophthalmology,  Dentist,  Podiatrist at least yearly or according to recommendations, and advised to  stay away from smoking. I have recommended yearly flu vaccine and pneumonia vaccination at least every 5 years; moderate intensity exercise for up to 150 minutes weekly; and  sleep for at least 7 hours a day.    - I advised patient to maintain close follow up with Sinda Du, MD for primary care needs.  - Time spent with the patient: 25 min, of which >50% was spent in reviewing her blood glucose logs , discussing her hypo- and hyper-glycemic episodes, reviewing her current and  previous labs and insulin doses and developing a plan to avoid hypo- and hyper-glycemia. Please refer to Patient Instructions for Blood Glucose Monitoring and Insulin/Medications Dosing Guide"  in media tab for additional information. [name] [name] participated in the discussions, expressed understanding, and voiced agreement with the above plans.  All questions were answered to her satisfaction. she is encouraged to contact clinic should she have any questions or concerns prior to her return visit.   Follow up plan: - Return in about 3 months (around 07/24/2018) for follow up with pre-visit labs, meter, and logs. - Time spent with the patient: 25 min, of which >50% was spent in reviewing her sugar logs , discussing her hypo- and hyper-glycemic episodes, reviewing her current and  previous labs and insulin doses and developing a plan to avoid hypo- and hyper-glycemia.   Glade Lloyd, MD Phone: [phone]  Fax: [phone]   04/23/2018, 1:06 PM This note was partially dictated with voice recognition software. Similar sounding words can be transcribed inadequately or may not  be corrected  upon review.

## 2018-04-23 NOTE — Patient Instructions (Addendum)
Goals 1. Walk 30 minutes daily. 2. Cut out the junk food. 3. Keep drinking water 4. Get A1C to 7.5%. Taking 40 units of Toujeo daily, Januvia and Metformin daily.

## 2018-04-23 NOTE — Progress Notes (Signed)
  Medical Nutrition Therapy:  Appt start time: 1100 end time:  1130 Assessment:  Primary concerns today: Diabets Type 2.  Hasn't changed much at all. Still trying to get rid of junk food. She notes she hasn't been focused in managing her diabetes.. Saw Dr. Dorris Fetch today. Toujeo increased to 40 units a day, Januvia and Metformin 500 mg BID.    Current diet is still inconsistent to meet her needs and improve her DM. Hasn't had iron levels check yet from PCP. Needs to make an appt.   Willing to start walking.  Lab Results  Component Value Date   HGBA1C 8.8 (H) 04/16/2018   Preferred Learning Style:   No preference indicated   Learning Readiness:  Ready  Change in progress   MEDICATIONS: see list   DIETARY INTAKE:  24-hr recall:  B ( AM): Toast 2 slices and pb, water Snk ( AM):   L ( PM):PInto beans, grilled vegetables, water Snk ( PM):  misc yogurt  D ( PM):  Left overs from lunch. Snk ( PM):  Usual physical activity: walking  Estimated energy needs: 1500  calories 170 g carbohydrates 112 g protein 42 g fat  Progress Towards Goal(s):  In progress.   Nutritional Diagnosis:  NB-1.1 Food and nutrition-related knowledge deficit As related to Diabetes.  As evidenced by A1C 10.4%..    Intervention:  Nutrition and Diabetes education provided on My Plate, CHO counting, meal planning, portion sizes, timing of meals, avoiding snacks between meals unless having a low blood sugar, target ranges for A1C and blood sugars, signs/symptoms and treatment of hyper/hypoglycemia, monitoring blood sugars, taking medications as prescribed, benefits of exercising 30 minutes per day and prevention of complications of DM.   Goals Drink 4 bottles of water per day. INcrease vegetables and fruit with meals. Contact Dr. Luan Pulling to get blood work for possible anemia and ask about taking iron pills.   Teaching Method Utilized: Visual Auditory Hands on  Handouts given during visit include:  The  Plate Method   Meal Plan Card   Barriers to learning/adherence to lifestyle change:  none  Demonstrated degree of understanding via:  Teach Back   Monitoring/Evaluation:  Dietary intake, exercise,  Meal planning, and body weight in  2-3 months. She may need to be evaluated for possible anemia.

## 2018-05-28 LAB — COMPLETE METABOLIC PANEL WITH GFR
AG Ratio: 1.4 (calc) (ref 1.0–2.5)
ALBUMIN MSPROF: 4.5 g/dL (ref 3.6–5.1)
ALKALINE PHOSPHATASE (APISO): 51 U/L (ref 33–130)
ALT: 23 U/L (ref 6–29)
AST: 32 U/L (ref 10–35)
BUN: 9 mg/dL (ref 7–25)
CHLORIDE: 102 mmol/L (ref 98–110)
CO2: 27 mmol/L (ref 20–32)
CREATININE: 0.73 mg/dL (ref 0.50–0.99)
Calcium: 9.7 mg/dL (ref 8.6–10.4)
GFR, Est African American: 101 mL/min/{1.73_m2} (ref >=60)
GFR, Est Non African American: 87 mL/min/{1.73_m2} (ref >=60)
GLUCOSE: 233 mg/dL — AB (ref 65–99)
Globulin: 3.3 g/dL (calc) (ref 1.9–3.7)
Potassium: 4.4 mmol/L (ref 3.5–5.3)
Sodium: 137 mmol/L (ref 135–146)
Total Bilirubin: 0.6 mg/dL (ref 0.2–1.2)
Total Protein: 7.8 g/dL (ref 6.1–8.1)

## 2018-05-28 LAB — HEMOGLOBIN A1C
Hgb A1c MFr Bld: 8.4 % of total Hgb — ABNORMAL HIGH (ref <5.7)
Mean Plasma Glucose: 194 (calc)
eAG (mmol/L): 10.8 (calc)

## 2018-06-08 ENCOUNTER — Other Ambulatory Visit: Payer: Self-pay | Admitting: "Endocrinology

## 2018-06-14 ENCOUNTER — Other Ambulatory Visit: Payer: Self-pay | Admitting: "Endocrinology

## 2018-06-16 ENCOUNTER — Telehealth: Payer: Self-pay | Admitting: "Endocrinology

## 2018-06-16 NOTE — Telephone Encounter (Signed)
[name] [name] is asking for a refill on Insulin Glargine (TOUJEO SOLOSTAR) 300 UNIT/ML SOPN  Please advise?

## 2018-06-17 NOTE — Telephone Encounter (Signed)
Rx sent 

## 2018-07-03 ENCOUNTER — Other Ambulatory Visit: Payer: Self-pay | Admitting: "Endocrinology

## 2018-07-23 ENCOUNTER — Encounter (HOSPITAL_COMMUNITY): Payer: Self-pay | Admitting: Psychiatry

## 2018-07-23 ENCOUNTER — Ambulatory Visit: Payer: 59 | Admitting: "Endocrinology

## 2018-07-23 ENCOUNTER — Ambulatory Visit (HOSPITAL_COMMUNITY): Payer: 59 | Admitting: Psychiatry

## 2018-07-23 ENCOUNTER — Ambulatory Visit: Payer: Self-pay | Admitting: Nutrition

## 2018-07-23 VITALS — BP 160/70 | HR 69 | Ht 62.0 in | Wt 144.0 lb

## 2018-07-23 DIAGNOSIS — F331 Major depressive disorder, recurrent, moderate: Secondary | ICD-10-CM | POA: Diagnosis not present

## 2018-07-23 MED ORDER — SERTRALINE HCL 100 MG PO TABS: 100.0000 mg | ORAL_TABLET | Freq: Two times a day (BID) | ORAL | 5 refills | Status: DC

## 2018-07-23 NOTE — Progress Notes (Signed)
[facility] MD/PA/NP OP Progress Note  07/23/2018 10:18 AM [name] [name]  MRN:  [id number]  Chief Complaint:  Chief Complaint    Depression; Anxiety; Follow-up     HPI: Patient is 67 year old married Caucasian female lives with her husband in [place]. 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 She has 2 children and 3 grandchildren.  The patient states she's had anxiety issues for at least 20 years. She has mitral valve prolapse and she claims it "tore up my nerves." She's tried numerous medicines at [facility] as worked the best for her. Currently her anxiety is well controlled her mood is good. She doesn't sleep much at night only gets 3-4 hours of rest. This has been going on for about 5 years and she's not sure why she. She claims she's adjusted to it.   the patient returns after 6 months.  She claims that she stopped the [place] because she is worried about being able to pay for it when she goes on Medicare in October.  She states that she has been off his about 2 months and has not seen any difference in her mood.  She states her mood is generally fairly neutral.  She denies being seriously depressed or suicidal.  Her sleep has not changed much.  She still falls asleep on the couch and then goes to bed later.  She is working with a nutritionist regarding her blood sugar but she does not seem to be following any of the recommendations because she is "hard headed."  On the positive side her A1c is continue to improve although it is still over 8. Visit Diagnosis:    ICD-10-CM   1. 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 Anxiety   . Constipation 03/23/2013  . Depression   . Diabetes mellitus type II   . Elevated cholesterol   . Helicobacter pylori gastritis 07/10/2017   Dx EGD -  1) Omeprazole 20 mg 2 times a day x 14 d 2) Pepto  Bismol 2 tabs (262 mg each) 4 times a day x 14 d 3) Metronidazole 250 mg 4 times a day x 14 d 4) doxycycline 100 mg 2 times a day x 14 d  After 14 d stop omeprazole also  In 4 weeks after treatment completed do H. Pylori stool antigen - dx H. Pylori gastritis   . Hx of adenomatous colonic polyps 10/05/2015  . Hyperlipemia 02/21/09   NUC STRESS TEST-NORMAL- EF 74%  . Iron deficiency anemia   . MVP (mitral valve prolapse)   . Rectocele     Past Surgical History:  Procedure Laterality Date  . ABDOMINAL HYSTERECTOMY    . BILATERAL SALPINGOOPHORECTOMY    . carpel tunnel Bilateral 12/17/2009  . COLONOSCOPY    . ESOPHAGOGASTRODUODENOSCOPY      Family Psychiatric History: See below  Family History:  Family History  Problem Relation Age of Onset  . Anxiety disorder Mother   . ADD / ADHD Brother   . Alcohol abuse Brother        x 3  . Drug abuse Brother   . Anxiety disorder Maternal Aunt   . Anxiety disorder Maternal Grandmother   . Bipolar disorder Other   . Diabetes Maternal Uncle   . Dementia Neg Hx   . OCD Neg Hx   . Paranoid behavior Neg Hx   .  Physical abuse Neg Hx   . Schizophrenia Neg Hx   . Seizures Neg Hx   . Sexual abuse Neg Hx   . Colon cancer Neg Hx   . Colon polyps Neg Hx   . Pancreatic cancer Neg Hx   . Esophageal cancer Neg Hx   . Stomach cancer Neg Hx   . Kidney disease Neg Hx   . Liver disease Neg Hx     Social History:  Social History   Socioeconomic History  . Marital status: Married    Spouse name: Not on file  . Number of children: Not on file  . Years of education: Not on file  . Highest education level: Not on file  Occupational History  . Not on file  Social Needs  . Financial resource strain: Not on file  . Food insecurity:    Worry: Not on file    Inability: Not on file  . Transportation needs:    Medical: Not on file    Non-medical: Not on file  Tobacco Use  . Smoking status: Former Smoker    Years: 30.00    Types: Cigarettes     Last attempt to quit: 09/03/1997    Years since quitting: 20.8  . Smokeless tobacco: Current User    Types: Snuff  . Tobacco comment: call when ready to quit  Substance and Sexual Activity  . Alcohol use: No    Alcohol/week: 0.0 oz  . Drug use: No  . Sexual activity: Never    Birth control/protection: Surgical  Lifestyle  . Physical activity:    Days per week: Not on file    Minutes per session: Not on file  . Stress: Not on file  Relationships  . Social connections:    Talks on phone: Not on file    Gets together: Not on file    Attends religious service: Not on file    Active member of club or organization: Not on file    Attends meetings of clubs or organizations: Not on file    Relationship status: Not on file  Other Topics Concern  . Not on file  Social History Narrative   Married 2 sons   She is a housewife   No caffeine   09/23/2015    Allergies:  Allergies  Allergen Reactions  . Codeine Other (See Comments)    Took it years ago and doesn't remember the reaction    Metabolic Disorder Labs: Lab Results  Component Value Date   HGBA1C 8.4 (H) 05/27/2018   MPG 194 05/27/2018   MPG 206 04/16/2018   No results found for: PROLACTIN Lab Results  Component Value Date   CHOL 188 04/16/2018   TRIG 101 04/16/2018   HDL 64 04/16/2018   CHOLHDL 2.9 04/16/2018   VLDL 27 09/13/2014   LDLCALC 105 (H) 04/16/2018   LDLCALC 128 (H) 09/13/2014   Lab Results  Component Value Date   TSH 1.32 04/16/2018   TSH 2.208 09/13/2014    Therapeutic Level Labs: No results found for: LITHIUM No results found for: VALPROATE No components found for:  CBMZ  Current Medications: Current Outpatient Medications  Medication Sig Dispense Refill  . atenolol (TENORMIN) 50 MG tablet TAKE 1 TABLET (50 MG TOTAL) BY MOUTH 2 (TWO) TIMES DAILY. 180 tablet 3  . BIOTIN PO Take 500 mg by mouth daily.     Marland Kitchen CINNAMON PO Take 2,000 mg by mouth daily.     Marland Kitchen dicyclomine (BENTYL) 20 MG tablet  Take 20 mg by mouth 3 (three) times daily.     . Insulin Glargine (TOUJEO SOLOSTAR) 300 UNIT/ML SOPN Inject 40 Units into the skin at bedtime. 3 pen 2  . Insulin Glargine (TOUJEO SOLOSTAR) 300 UNIT/ML SOPN Inject 40 mLs into the skin at bedtime. 4.5 mL 2  . Insulin Pen Needle (BD PEN NEEDLE NANO U/F) 32G X 4 MM MISC 1 each by Does not apply route 4 (four) times daily. 150 each 5  . lisinopril (PRINIVIL,ZESTRIL) 10 MG tablet Take 1 tablet (10 mg total) by mouth daily. 30 tablet 3  . metFORMIN (GLUCOPHAGE) 500 MG tablet TAKE 1 TABLET (500 MG TOTAL) BY MOUTH 2 (TWO) TIMES DAILY WITH A MEAL. 60 tablet 2  . sertraline (ZOLOFT) 100 MG tablet Take 1 tablet (100 mg total) by mouth 2 (two) times daily. 60 tablet 5  . sitaGLIPtin (JANUVIA) 50 MG tablet Take 2 tablets (100 mg total) by mouth daily. 30 tablet 6  . WELCHOL 625 MG tablet Taking 6 Tablets daily    . ZETIA 10 MG tablet Take 10 mg by mouth daily.      No current facility-administered medications for this visit.      Musculoskeletal: Strength & Muscle Tone: within normal limits Gait & Station: normal Patient leans: N/A  Psychiatric Specialty Exam: Review of Systems  Constitutional: Positive for malaise/fatigue.  All other systems reviewed and are negative.   Blood pressure (!) 160/70, pulse 69, height 5\' 2"  (1.575 m), weight 144 lb (65.3 kg), SpO2 98 %.Body mass index is 26.34 kg/m.  General Appearance: Casual, Neat and Well Groomed  Eye Contact:  Good  Speech:  Clear and Coherent  Volume:  Normal  Mood:  Euthymic  Affect:  Congruent and Flat  Thought Process:  Goal Directed  Orientation:  Full (Time, Place, and Person)  Thought Content: Rumination   Suicidal Thoughts:  No  Homicidal Thoughts:  No  Memory:  Immediate;   Good Recent;   Good Remote;   Fair  Judgement:  Good  Insight:  Fair  Psychomotor Activity:  Decreased  Concentration:  Concentration: Good and Attention Span: Good  Recall:  Good  Fund of Knowledge: Fair   Language: Good  Akathisia:  No  Handed:  Right  AIMS (if indicated): not done  Assets:  Communication Skills Desire for Improvement Resilience Social Support Talents/Skills  ADL's:  Intact  Cognition: WNL  Sleep:  Fair   Screenings: PHQ2-9     Office Visit from 04/23/2018 in [facility] Most recent reading at 04/23/2018 10:19 AM Nutrition from 01/27/2018 in [facility] Most recent reading at 01/27/2018  4:12 PM Office Visit from 01/27/2018 in [facility] Most recent reading at 01/27/2018 10:08 AM Office Visit from 10/23/2017 in [facility] Most recent reading at 10/23/2017 10:11 AM Nutrition from 10/01/2017 in [facility] Most recent reading at 10/01/2017 11:54 AM  PHQ-2 Total Score  0  0  0  0  0       Assessment and Plan: Patient is a 67 year old female with a history of depression.  She is currently on Zoloft 100 mg twice daily.  She wants to remain on this regimen and states that she feels as good as she is ever been.  She is rather nonchalant about wanting to make changes or improvements.  She is not suicidal and is functioning well at home and therefore does not want to make any changes.  She  does not want to stay on Rexulti because of the cost.  She will continue her current regimen and return to see me in 6 months   Levonne Spiller, MD 07/23/2018, 10:18 AM

## 2018-07-24 ENCOUNTER — Ambulatory Visit (HOSPITAL_COMMUNITY): Payer: Self-pay | Admitting: Psychiatry

## 2018-07-30 ENCOUNTER — Other Ambulatory Visit (HOSPITAL_COMMUNITY): Payer: Self-pay | Admitting: Psychiatry

## 2018-07-30 DIAGNOSIS — F331 Major depressive disorder, recurrent, moderate: Secondary | ICD-10-CM

## 2018-07-30 MED ORDER — SERTRALINE HCL 100 MG PO TABS: 100.0000 mg | ORAL_TABLET | Freq: Two times a day (BID) | ORAL | 2 refills | Status: DC

## 2018-08-04 ENCOUNTER — Other Ambulatory Visit: Payer: Self-pay | Admitting: "Endocrinology

## 2018-08-06 ENCOUNTER — Encounter: Payer: Self-pay | Admitting: "Endocrinology

## 2018-08-06 ENCOUNTER — Encounter: Payer: Self-pay | Admitting: Nutrition

## 2018-08-06 ENCOUNTER — Ambulatory Visit (INDEPENDENT_AMBULATORY_CARE_PROVIDER_SITE_OTHER): Payer: 59 | Admitting: "Endocrinology

## 2018-08-06 ENCOUNTER — Encounter: Payer: 59 | Attending: Pulmonary Disease | Admitting: Nutrition

## 2018-08-06 VITALS — Ht 62.0 in | Wt 142.0 lb

## 2018-08-06 VITALS — BP 133/66 | HR 71 | Ht 62.0 in | Wt 142.0 lb

## 2018-08-06 DIAGNOSIS — E782 Mixed hyperlipidemia: Secondary | ICD-10-CM | POA: Diagnosis not present

## 2018-08-06 DIAGNOSIS — E1165 Type 2 diabetes mellitus with hyperglycemia: Secondary | ICD-10-CM | POA: Diagnosis not present

## 2018-08-06 DIAGNOSIS — IMO0002 Reserved for concepts with insufficient information to code with codable children: Secondary | ICD-10-CM

## 2018-08-06 DIAGNOSIS — E118 Type 2 diabetes mellitus with unspecified complications: Secondary | ICD-10-CM | POA: Insufficient documentation

## 2018-08-06 DIAGNOSIS — I10 Essential (primary) hypertension: Secondary | ICD-10-CM

## 2018-08-06 DIAGNOSIS — Z713 Dietary counseling and surveillance: Secondary | ICD-10-CM | POA: Insufficient documentation

## 2018-08-06 MED ORDER — INSULIN GLARGINE 300 UNIT/ML ~~LOC~~ SOPN: 50.0000 [IU] | PEN_INJECTOR | Freq: Every day | SUBCUTANEOUS | 2 refills | Status: DC

## 2018-08-06 NOTE — Patient Instructions (Signed)

## 2018-08-06 NOTE — Patient Instructions (Addendum)
Goals 1.Cut out honey and just do PB and toast for breakfast 2. Drink more water 3. Walk 15 minutes a day. 4. Eat fruit and drink water when craaving sweets Don't buy soda and sweets so theyre not in the house.

## 2018-08-06 NOTE — Progress Notes (Signed)
  Medical Nutrition Therapy:  Appt start time: 0930  end time:  1000 Asessment:  Primary concerns today: Diabetes Type 2. She says she hasn't made any changes.  Toujeo incrased to 50 units , Januvia, Metformin 500 mg BID.  She notes she is trying to walk some and  cut down on the sweets. BS are high due to usually sweets eaten with dinner or between meals. A1C down to 8.4% from 8.8%. She sees Dr. Dorris Fetch. Saw him today..    Lab Results  Component Value Date   HGBA1C 8.4 (H) 05/27/2018   Preferred Learning Style:   No preference indicated   Learning Readiness:  Ready  Change in progress   MEDICATIONS: see list   DIETARY INTAKE:  24-hr recall:  B ( AM): Toast 2 slices and pb/honey , water Snk ( AM):   L ( PM): Grilled vegetables, green beans and potroast, water Snk ( PM):  D ( PM):  Same as lunch. Snk ( PM): Diet soda, chips or sweet , Usual physical activity: walking  Estimated energy needs: 1500  calories 170 g carbohydrates 112 g protein 42 g fat  Progress Towards Goal(s):  In progress.   Nutritional Diagnosis:  NB-1.1 Food and nutrition-related knowledge deficit As related to Diabetes.  As evidenced by A1C 10.4%..    Intervention:  Nutrition and Diabetes education provided on My Plate, CHO counting, meal planning, portion sizes, timing of meals, avoiding snacks between meals unless having a low blood sugar, target ranges for A1C and blood sugars, signs/symptoms and treatment of hyper/hypoglycemia, monitoring blood sugars, taking medications as prescribed, benefits of exercising 30 minutes per day and prevention of complications of DM.  Goals 1.Cut out honey and just do PB and toast for breakfast 2. Drink more water 3. Walk 15 minutes a day. 4. Eat fruit and drink water when craaving sweets Don't buy soda and sweets so theyre not in the house.  Teaching Method Utilized: Visual Auditory Hands on  Handouts given during visit include:  The Plate Method    Meal Plan Card   Barriers to learning/adherence to lifestyle change:  none  Demonstrated degree of understanding via:  Teach Back   Monitoring/Evaluation:  Dietary intake, exercise,  Meal planning, and body weight in  2-3 months.

## 2018-08-06 NOTE — Progress Notes (Signed)
Endocrinology follow-up note  Subjective:    Patient ID: [name] [name], female    DOB: Jul 14, 1951.  she is being seen in follow-up for management of currently uncontrolled symptomatic diabetes requested by  Sinda Du, MD.   Past Medical History:  Diagnosis Date  . Anxiety   . Constipation 03/23/2013  . Depression   . Diabetes mellitus type II   . Elevated cholesterol   . Helicobacter pylori gastritis 07/10/2017   Dx EGD -  1) Omeprazole 20 mg 2 times a day x 14 d 2) Pepto Bismol 2 tabs (262 mg each) 4 times a day x 14 d 3) Metronidazole 250 mg 4 times a day x 14 d 4) doxycycline 100 mg 2 times a day x 14 d  After 14 d stop omeprazole also  In 4 weeks after treatment completed do H. Pylori stool antigen - dx H. Pylori gastritis   . Hx of adenomatous colonic polyps 10/05/2015  . Hyperlipemia 02/21/09   NUC STRESS TEST-NORMAL- EF 74%  . Iron deficiency anemia   . MVP (mitral valve prolapse)   . Rectocele    Past Surgical History:  Procedure Laterality Date  . ABDOMINAL HYSTERECTOMY    . BILATERAL SALPINGOOPHORECTOMY    . carpel tunnel Bilateral 12/17/2009  . COLONOSCOPY    . ESOPHAGOGASTRODUODENOSCOPY     Social History   Socioeconomic History  . Marital status: Married    Spouse name: Not on file  . Number of children: Not on file  . Years of education: Not on file  . Highest education level: Not on file  Occupational History  . Not on file  Social Needs  . Financial resource strain: Not on file  . Food insecurity:    Worry: Not on file    Inability: Not on file  . Transportation needs:    Medical: Not on file    Non-medical: Not on file  Tobacco Use  . Smoking status: Former Smoker    Years: 30.00    Types: Cigarettes    Last attempt to quit: 09/03/1997    Years since quitting: 20.9  . Smokeless tobacco: Current User    Types: Snuff  . Tobacco comment: call when ready to quit  Substance and Sexual Activity  . Alcohol use: No    Alcohol/week: 0.0  standard drinks  . Drug use: No  . Sexual activity: Never    Birth control/protection: Surgical  Lifestyle  . Physical activity:    Days per week: Not on file    Minutes per session: Not on file  . Stress: Not on file  Relationships  . Social connections:    Talks on phone: Not on file    Gets together: Not on file    Attends religious service: Not on file    Active member of club or organization: Not on file    Attends meetings of clubs or organizations: Not on file    Relationship status: Not on file  Other Topics Concern  . Not on file  Social History Narrative   Married 2 sons   She is a housewife   No caffeine   09/23/2015   Outpatient Encounter Medications as of 08/06/2018  Medication Sig  . atenolol (TENORMIN) 50 MG tablet TAKE 1 TABLET (50 MG TOTAL) BY MOUTH 2 (TWO) TIMES DAILY.  Marland Kitchen BIOTIN PO Take 500 mg by mouth daily.   Marland Kitchen CINNAMON PO Take 2,000 mg by mouth daily.   Marland Kitchen dicyclomine (BENTYL) 20 MG  tablet Take 20 mg by mouth 3 (three) times daily.   . Insulin Glargine (TOUJEO SOLOSTAR) 300 UNIT/ML SOPN Inject 50 Units into the skin at bedtime.  . Insulin Pen Needle (BD PEN NEEDLE NANO U/F) 32G X 4 MM MISC 1 each by Does not apply route 4 (four) times daily.  Marland Kitchen lisinopril (PRINIVIL,ZESTRIL) 10 MG tablet Take 1 tablet (10 mg total) by mouth daily.  . metFORMIN (GLUCOPHAGE) 500 MG tablet TAKE 1 TABLET (500 MG TOTAL) BY MOUTH 2 (TWO) TIMES DAILY WITH A MEAL.  Marland Kitchen sertraline (ZOLOFT) 100 MG tablet Take 1 tablet (100 mg total) by mouth 2 (two) times daily.  . sitaGLIPtin (JANUVIA) 50 MG tablet Take 2 tablets (100 mg total) by mouth daily.  Earnestine Mealing 625 MG tablet Taking 6 Tablets daily  . ZETIA 10 MG tablet Take 10 mg by mouth daily.   . [DISCONTINUED] Insulin Glargine (TOUJEO SOLOSTAR) 300 UNIT/ML SOPN Inject 40 Units into the skin at bedtime.  . [DISCONTINUED] Insulin Glargine (TOUJEO SOLOSTAR) 300 UNIT/ML SOPN Inject 40 mLs into the skin at bedtime.   No facility-administered  encounter medications on file as of 08/06/2018.     ALLERGIES: Allergies  Allergen Reactions  . Codeine Other (See Comments)    Took it years ago and doesn't remember the reaction    VACCINATION STATUS: Immunization History  Administered Date(s) Administered  . Influenza,inj,Quad PF,6+ Mos 09/16/2014    Diabetes  She presents for her follow-up diabetic visit. She has type 2 diabetes mellitus. Onset time: She was diagnosed at approximate age of 12 years. Her disease course has been improving. There are no hypoglycemic associated symptoms. Pertinent negatives for hypoglycemia include no confusion, headaches, pallor or seizures. Pertinent negatives for diabetes include no blurred vision, no chest pain, no fatigue, no polydipsia, no polyphagia and no polyuria. There are no hypoglycemic complications. Symptoms are improving. There are no diabetic complications. Risk factors for coronary artery disease include diabetes mellitus, dyslipidemia, sedentary lifestyle and tobacco exposure. Current diabetic treatment includes oral agent (dual therapy). Her weight is stable. She is following a generally unhealthy diet. When asked about meal planning, she reported none. She has not had a previous visit with a dietitian. She never participates in exercise. Her breakfast blood glucose range is generally 140-180 mg/dl. Her bedtime blood glucose range is generally 140-180 mg/dl. Her overall blood glucose range is 140-180 mg/dl. An ACE inhibitor/angiotensin II receptor blocker is not being taken. She does not see a podiatrist.Eye exam is current (She reports no retinopathy from a visit with ophthalmologist 2 years ago.).  Hyperlipidemia  This is a chronic problem. The current episode started more than 1 year ago. Exacerbating diseases include diabetes. Pertinent negatives include no chest pain, myalgias or shortness of breath. Current antihyperlipidemic treatment includes ezetimibe. Compliance problems include  adherence to diet.  Risk factors for coronary artery disease include diabetes mellitus, dyslipidemia and a sedentary lifestyle.    Review of Systems  Constitutional: Negative for chills, fatigue, fever and unexpected weight change.  HENT: Negative for trouble swallowing and voice change.   Eyes: Negative for blurred vision and visual disturbance.  Respiratory: Negative for cough, shortness of breath and wheezing.   Cardiovascular: Negative for chest pain, palpitations and leg swelling.  Gastrointestinal: Negative for diarrhea, nausea and vomiting.  Endocrine: Negative for cold intolerance, heat intolerance, polydipsia, polyphagia and polyuria.  Musculoskeletal: Negative for arthralgias and myalgias.  Skin: Negative for color change, pallor, rash and wound.  Neurological: Negative for seizures and  headaches.  Psychiatric/Behavioral: Negative for confusion and suicidal ideas.    Objective:    BP 133/66   Pulse 71   Ht 5\' 2"  (1.575 m)   Wt 142 lb (64.4 kg)   BMI 25.97 kg/m   Wt Readings from Last 3 Encounters:  08/06/18 142 lb (64.4 kg)  08/06/18 142 lb (64.4 kg)  04/23/18 143 lb (64.9 kg)     Physical Exam  Constitutional: She is oriented to person, place, and time. She appears well-developed.  HENT:  Head: Normocephalic and atraumatic.  Eyes: EOM are normal.  Neck: Normal range of motion. Neck supple. No tracheal deviation present. No thyromegaly present.  Cardiovascular: Normal rate.  Pulmonary/Chest: Effort normal.  Abdominal: There is no tenderness. There is no guarding.  Musculoskeletal: Normal range of motion. She exhibits no edema.  Neurological: She is alert and oriented to person, place, and time. She has normal reflexes. No cranial nerve deficit. Coordination normal.  Skin: Skin is warm and dry. No rash noted. No erythema. No pallor.  Psychiatric: She has a normal mood and affect. Judgment normal.    CMP ( most recent) CMP     Component Value Date/Time   NA  137 05/27/2018 1039   K 4.4 05/27/2018 1039   CL 102 05/27/2018 1039   CO2 27 05/27/2018 1039   GLUCOSE 233 (H) 05/27/2018 1039   BUN 9 05/27/2018 1039   BUN 7 07/18/2015   CREATININE 0.73 05/27/2018 1039   CALCIUM 9.7 05/27/2018 1039   PROT 7.8 05/27/2018 1039   ALBUMIN 4.2 09/13/2014 0730   AST 32 05/27/2018 1039   ALT 23 05/27/2018 1039   ALKPHOS 52 09/13/2014 0730   BILITOT 0.6 05/27/2018 1039   GFRNONAA 87 05/27/2018 1039   GFRAA 101 05/27/2018 1039     Diabetic Labs (most recent): Lab Results  Component Value Date   HGBA1C 8.4 (H) 05/27/2018   HGBA1C 8.8 (H) 04/16/2018   HGBA1C 9.8 (H) 01/20/2018     Lipid Panel ( most recent) Lipid Panel     Component Value Date/Time   CHOL 188 04/16/2018 0732   TRIG 101 04/16/2018 0732   HDL 64 04/16/2018 0732   CHOLHDL 2.9 04/16/2018 0732   VLDL 27 09/13/2014 0730   LDLCALC 105 (H) 04/16/2018 0732     Assessment & Plan:   1. Uncontrolled type 2 diabetes mellitus with complication, without long-term current use of insulin ([place])  - Patient has currently uncontrolled symptomatic type 2 DM since  67 years of age. - She came with improved blood glucose profile, A1c improving to 8.4% from 9.8%.      Recent labs reviewed.  - She does not report any gross complications, however, RONIYA TETRO remains at a high risk for more acute and chronic complications which include CAD, CVA, CKD, retinopathy, and neuropathy. These are all discussed in detail with the patient.  - I have counseled her on diet management by adopting a carbohydrate restricted/protein rich diet.  -  Suggestion is made for her to avoid simple carbohydrates  from her diet including Cakes, Sweet Desserts / Pastries, Ice Cream, Soda (diet and regular), Sweet Tea, Candies, Chips, Cookies, Store Bought Juices, Alcohol in Excess of  1-2 drinks a day, Artificial Sweeteners, and "Sugar-free" Products. This will help patient to have stable blood glucose profile and  potentially avoid unintended weight gain.  - I encouraged her to switch to  unprocessed or minimally processed complex starch and increased protein intake (animal or  plant source), fruits, and vegetables.  - she is advised to stick to a routine mealtimes to eat 3 meals  a day and avoid unnecessary snacks ( to snack only to correct hypoglycemia).   - I have approached her with the following individualized plan to manage diabetes and patient agrees:   -She presents with improved but still significantly above target glucose profile both fasting and postprandial.   -I advised her to increase her Toujeo to 50 units nightly , associated with strict monitoring of blood glucose 2 times a day-before breakfast and at bedtime . - She has tolerated metformin, will continue at 500 mg by mouth twice a day. - I advised her to continue Januvia 50 mg p.o. daily with breakfast.     -Patient is encouraged to call clinic for blood glucose levels less than 70 or above 300 mg /dl. - She is not a suitable candidate for GLP-1RA therapy due to her body habitus.  - Patient specific target  A1c;  LDL, HDL, Triglycerides, and  Waist Circumference were discussed in detail.  2) BP/HTN: Her blood pressure is controlled to target.  She has responded to low-dose lisinopril with better control of high blood pressure.  She is advised to continue lisinopril 10 mg p.o. daily with breakfast.    3) Lipids/HPL: Her recent LDL showing uncontrolled status at 128.  She does not tolerate statins.  I advised her to continue Zetia and welchol.  4)  Weight/Diet: CDE Consult is in progress , exercise, and detailed carbohydrates information provided.  5) Chronic Care/Health Maintenance:  -she  is not  on ACEI/ARB and Statin medications,   is encouraged to continue to follow up with Ophthalmology, Dentist,  Podiatrist at least yearly or according to recommendations, and advised to  stay away from smoking. I have recommended yearly flu  vaccine and pneumonia vaccination at least every 5 years; moderate intensity exercise for up to 150 minutes weekly; and  sleep for at least 7 hours a day.    - I advised patient to maintain close follow up with Sinda Du, MD for primary care needs.  - Time spent with the patient: 25 min, of which >50% was spent in reviewing her blood glucose logs , discussing her hypo- and hyper-glycemic episodes, reviewing her current and  previous labs and insulin doses and developing a plan to avoid hypo- and hyper-glycemia. Please refer to Patient Instructions for Blood Glucose Monitoring and Insulin/Medications Dosing Guide"  in media tab for additional information. [name] participated in the discussions, expressed understanding, and voiced agreement with the above plans.  All questions were answered to her satisfaction. she is encouraged to contact clinic should she have any questions or concerns prior to her return visit.   Follow up plan: - Return in about 3 months (around 11/06/2018) for Meter, and Logs, Follow up with Pre-visit Labs, Meter, and Logs.  Glade Lloyd, MD Phone: [phone]  Fax: [phone]   08/06/2018, 10:21 AM This note was partially dictated with voice recognition software. Similar sounding words can be transcribed inadequately or may not  be corrected upon review.

## 2018-08-19 ENCOUNTER — Other Ambulatory Visit: Payer: Self-pay | Admitting: Cardiovascular Disease

## 2018-08-19 ENCOUNTER — Other Ambulatory Visit: Payer: Self-pay | Admitting: "Endocrinology

## 2018-09-30 ENCOUNTER — Other Ambulatory Visit: Payer: Self-pay | Admitting: "Endocrinology

## 2018-10-12 DIAGNOSIS — E118 Type 2 diabetes mellitus with unspecified complications: Secondary | ICD-10-CM | POA: Diagnosis not present

## 2018-10-14 ENCOUNTER — Other Ambulatory Visit (HOSPITAL_COMMUNITY): Payer: Self-pay | Admitting: Psychiatry

## 2018-10-14 DIAGNOSIS — F331 Major depressive disorder, recurrent, moderate: Secondary | ICD-10-CM

## 2018-10-14 MED ORDER — SERTRALINE HCL 100 MG PO TABS: 100.0000 mg | ORAL_TABLET | Freq: Two times a day (BID) | ORAL | 2 refills | Status: DC

## 2018-11-20 DIAGNOSIS — K589 Irritable bowel syndrome without diarrhea: Secondary | ICD-10-CM | POA: Diagnosis not present

## 2018-11-20 DIAGNOSIS — D649 Anemia, unspecified: Secondary | ICD-10-CM | POA: Diagnosis not present

## 2018-11-20 DIAGNOSIS — E785 Hyperlipidemia, unspecified: Secondary | ICD-10-CM | POA: Diagnosis not present

## 2018-11-20 DIAGNOSIS — E1165 Type 2 diabetes mellitus with hyperglycemia: Secondary | ICD-10-CM | POA: Diagnosis not present

## 2018-12-02 DIAGNOSIS — E785 Hyperlipidemia, unspecified: Secondary | ICD-10-CM | POA: Diagnosis not present

## 2018-12-02 DIAGNOSIS — F419 Anxiety disorder, unspecified: Secondary | ICD-10-CM | POA: Diagnosis not present

## 2018-12-02 DIAGNOSIS — R600 Localized edema: Secondary | ICD-10-CM | POA: Diagnosis not present

## 2018-12-02 DIAGNOSIS — K210 Gastro-esophageal reflux disease with esophagitis: Secondary | ICD-10-CM | POA: Diagnosis not present

## 2018-12-02 DIAGNOSIS — E1165 Type 2 diabetes mellitus with hyperglycemia: Secondary | ICD-10-CM | POA: Diagnosis not present

## 2018-12-02 DIAGNOSIS — R002 Palpitations: Secondary | ICD-10-CM | POA: Diagnosis not present

## 2018-12-02 DIAGNOSIS — K589 Irritable bowel syndrome without diarrhea: Secondary | ICD-10-CM | POA: Diagnosis not present

## 2018-12-02 DIAGNOSIS — F329 Major depressive disorder, single episode, unspecified: Secondary | ICD-10-CM | POA: Diagnosis not present

## 2018-12-02 DIAGNOSIS — D649 Anemia, unspecified: Secondary | ICD-10-CM | POA: Diagnosis not present

## 2018-12-02 LAB — CBC AND DIFFERENTIAL
HCT: 33 — AB (ref 36–46)
Hemoglobin: 10.7 — AB (ref 12.0–16.0)
Neutrophils Absolute: 2673
Platelets: 74 — AB (ref 150–399)
WBC: 4.1

## 2018-12-02 LAB — IRON,TIBC AND FERRITIN PANEL
%SAT: 16
Iron: 53
TIBC: 337

## 2018-12-02 LAB — LIPID PANEL
Cholesterol: 168 (ref 0–200)
HDL: 55 (ref 35–70)
LDL Cholesterol: 95
Triglycerides: 87 (ref 40–160)

## 2018-12-02 LAB — CBC: RBC: 3.82 — AB (ref 3.87–5.11)

## 2018-12-02 LAB — HEMOGLOBIN A1C: Hemoglobin A1C: 7.8

## 2018-12-08 ENCOUNTER — Ambulatory Visit: Payer: 59 | Admitting: "Endocrinology

## 2018-12-08 DIAGNOSIS — R197 Diarrhea, unspecified: Secondary | ICD-10-CM | POA: Diagnosis not present

## 2018-12-28 ENCOUNTER — Other Ambulatory Visit: Payer: Self-pay | Admitting: "Endocrinology

## 2018-12-31 ENCOUNTER — Ambulatory Visit: Payer: Self-pay | Admitting: Nutrition

## 2018-12-31 ENCOUNTER — Telehealth: Payer: Self-pay | Admitting: Nutrition

## 2018-12-31 NOTE — Telephone Encounter (Signed)
TC to pt. She doesn't want to be seen my myself or Dr. Dorris Fetch anymore. She notes she will see Dr. Luan Pulling for her Dm Mgt.

## 2019-01-08 ENCOUNTER — Encounter: Payer: Self-pay | Admitting: Internal Medicine

## 2019-01-15 DIAGNOSIS — F419 Anxiety disorder, unspecified: Secondary | ICD-10-CM | POA: Diagnosis not present

## 2019-01-15 DIAGNOSIS — K210 Gastro-esophageal reflux disease with esophagitis: Secondary | ICD-10-CM | POA: Diagnosis not present

## 2019-01-15 DIAGNOSIS — A0472 Enterocolitis due to Clostridium difficile, not specified as recurrent: Secondary | ICD-10-CM | POA: Diagnosis not present

## 2019-01-15 DIAGNOSIS — E1165 Type 2 diabetes mellitus with hyperglycemia: Secondary | ICD-10-CM | POA: Diagnosis not present

## 2019-01-20 DIAGNOSIS — F419 Anxiety disorder, unspecified: Secondary | ICD-10-CM | POA: Diagnosis not present

## 2019-01-20 DIAGNOSIS — A0472 Enterocolitis due to Clostridium difficile, not specified as recurrent: Secondary | ICD-10-CM | POA: Diagnosis not present

## 2019-01-20 DIAGNOSIS — K210 Gastro-esophageal reflux disease with esophagitis: Secondary | ICD-10-CM | POA: Diagnosis not present

## 2019-01-20 DIAGNOSIS — E1165 Type 2 diabetes mellitus with hyperglycemia: Secondary | ICD-10-CM | POA: Diagnosis not present

## 2019-01-23 ENCOUNTER — Ambulatory Visit (HOSPITAL_COMMUNITY): Payer: Self-pay | Admitting: Psychiatry

## 2019-02-05 ENCOUNTER — Other Ambulatory Visit (HOSPITAL_COMMUNITY): Payer: Self-pay | Admitting: Psychiatry

## 2019-02-13 ENCOUNTER — Encounter: Payer: Self-pay | Admitting: Internal Medicine

## 2019-02-17 ENCOUNTER — Encounter: Payer: Self-pay | Admitting: Internal Medicine

## 2019-02-17 ENCOUNTER — Ambulatory Visit (AMBULATORY_SURGERY_CENTER): Payer: Self-pay

## 2019-02-17 VITALS — Ht 62.0 in | Wt 142.6 lb

## 2019-02-17 DIAGNOSIS — Z86010 Personal history of colonic polyps: Secondary | ICD-10-CM

## 2019-02-17 NOTE — Progress Notes (Signed)
Denies allergies to eggs or soy products. Denies complication of anesthesia or sedation. Denies use of weight loss medication. Denies use of O2.   Emmi instructions declined.  

## 2019-02-26 DIAGNOSIS — F321 Major depressive disorder, single episode, moderate: Secondary | ICD-10-CM | POA: Diagnosis not present

## 2019-02-26 DIAGNOSIS — E1165 Type 2 diabetes mellitus with hyperglycemia: Secondary | ICD-10-CM | POA: Diagnosis not present

## 2019-02-26 DIAGNOSIS — A0472 Enterocolitis due to Clostridium difficile, not specified as recurrent: Secondary | ICD-10-CM | POA: Diagnosis not present

## 2019-02-26 DIAGNOSIS — D649 Anemia, unspecified: Secondary | ICD-10-CM | POA: Diagnosis not present

## 2019-02-27 ENCOUNTER — Other Ambulatory Visit: Payer: Self-pay | Admitting: "Endocrinology

## 2019-03-03 ENCOUNTER — Telehealth: Payer: Self-pay | Admitting: *Deleted

## 2019-03-03 NOTE — Telephone Encounter (Signed)
Covid-19 travel screening questions  Have you traveled in the last 14 days? No If yes where?  Do you now or have you had a fever in the last 14 days? No  Do you have any respiratory symptoms of shortness of breath or cough now or in the last 14 days? No  Do you have any family members or close contacts with diagnosed or suspected Covid-19? No       

## 2019-03-04 ENCOUNTER — Encounter: Payer: Self-pay | Admitting: Internal Medicine

## 2019-03-04 ENCOUNTER — Ambulatory Visit (AMBULATORY_SURGERY_CENTER): Payer: Medicare HMO | Admitting: Internal Medicine

## 2019-03-04 ENCOUNTER — Other Ambulatory Visit: Payer: Self-pay

## 2019-03-04 VITALS — BP 146/69 | HR 67 | Temp 98.9°F | Resp 24 | Ht 62.0 in | Wt 142.0 lb

## 2019-03-04 DIAGNOSIS — K635 Polyp of colon: Secondary | ICD-10-CM

## 2019-03-04 DIAGNOSIS — E119 Type 2 diabetes mellitus without complications: Secondary | ICD-10-CM | POA: Diagnosis not present

## 2019-03-04 DIAGNOSIS — D123 Benign neoplasm of transverse colon: Secondary | ICD-10-CM

## 2019-03-04 DIAGNOSIS — Z860100 Personal history of colon polyps, unspecified: Secondary | ICD-10-CM

## 2019-03-04 DIAGNOSIS — Z860101 Personal history of adenomatous and serrated colon polyps: Secondary | ICD-10-CM

## 2019-03-04 DIAGNOSIS — I10 Essential (primary) hypertension: Secondary | ICD-10-CM | POA: Diagnosis not present

## 2019-03-04 LAB — HM COLONOSCOPY

## 2019-03-04 MED ORDER — CHOLESTYRAMINE LIGHT 4 G PO PACK: 4.0000 g | PACK | Freq: Two times a day (BID) | ORAL | 5 refills | Status: DC

## 2019-03-04 MED ORDER — SODIUM CHLORIDE 0.9 % IV SOLN: 500.0000 mL | Freq: Once | INTRAVENOUS | Status: DC

## 2019-03-04 NOTE — Patient Instructions (Addendum)
I found just one tiny polyp today. I will let you know pathology results and when to have another routine colonoscopy by mail and/or My Chart.  I sent a prescription for cholestyramine to try to help diarrhea. Take it with lunch and supper.  If that does not work call me back after a few weeks.  I appreciate the opportunity to care for you. Gatha Mayer, MD, [facility]   Discharge instructions given. Handout on polyps. Resume previous medications. YOU HAD AN ENDOSCOPIC PROCEDURE TODAY AT [facility]:   Refer to the procedure report that was given to you for any specific questions about what was found during the examination.  If the procedure report does not answer your questions, please call your gastroenterologist to clarify.  If you requested that your care partner not be given the details of your procedure findings, then the procedure report has been included in a sealed envelope for you to review at your convenience later.  YOU SHOULD EXPECT: Some feelings of bloating in the abdomen. Passage of more gas than usual.  Walking can help get rid of the air that was put into your GI tract during the procedure and reduce the bloating. If you had a lower endoscopy (such as a colonoscopy or flexible sigmoidoscopy) you may notice spotting of blood in your stool or on the toilet paper. If you underwent a bowel prep for your procedure, you may not have a normal bowel movement for a few days.  Please Note:  You might notice some irritation and congestion in your nose or some drainage.  This is from the oxygen used during your procedure.  There is no need for concern and it should clear up in a day or so.  SYMPTOMS TO REPORT IMMEDIATELY:   Following lower endoscopy (colonoscopy or flexible sigmoidoscopy):  Excessive amounts of blood in the stool  Significant tenderness or worsening of abdominal pains  Swelling of the abdomen that is new, acute  Fever of 100F or higher    For urgent or emergent issues, a gastroenterologist can be reached at any hour by calling [phone].   DIET:  We do recommend a small meal at first, but then you may proceed to your regular diet.  Drink plenty of fluids but you should avoid alcoholic beverages for 24 hours.  ACTIVITY:  You should plan to take it easy for the rest of today and you should NOT DRIVE or use heavy machinery until tomorrow (because of the sedation medicines used during the test).    FOLLOW UP: Our staff will call the number listed on your records the next business day following your procedure to check on you and address any questions or concerns that you may have regarding the information given to you following your procedure. If we do not reach you, we will leave a message.  However, if you are feeling well and you are not experiencing any problems, there is no need to return our call.  We will assume that you have returned to your regular daily activities without incident.  If any biopsies were taken you will be contacted by phone or by letter within the next 1-3 weeks.  Please call us at [phone] if you have not heard about the biopsies in 3 weeks.    SIGNATURES/CONFIDENTIALITY: You and/or your care partner have signed paperwork which will be entered into your electronic medical record.  These signatures attest to the fact that that the information above  on your After Visit Summary has been reviewed and is understood.  Full responsibility of the confidentiality of this discharge information lies with you and/or your care-partner.

## 2019-03-04 NOTE — Progress Notes (Signed)
Called to room to assist during endoscopic procedure.  Patient ID and intended procedure confirmed with present staff. Received instructions for my participation in the procedure from the performing physician.  

## 2019-03-04 NOTE — Progress Notes (Signed)
Report given to PACU, vss 

## 2019-03-04 NOTE — Progress Notes (Signed)
Pt's states no medical or surgical changes since previsit or office visit. 

## 2019-03-04 NOTE — Op Note (Addendum)
[place] Patient Name: [name] [name] Procedure Date: 03/04/2019 8:02 AM MRN: [id number] Endoscopist: Gatha Mayer , MD Age: 68 Referring MD:  Date of Birth: 1951-06-08 Gender: Female Account #: [id number] Procedure:                Colonoscopy Indications:              Surveillance: Personal history of adenomatous                            polyps on last colonoscopy > 3 years ago, Last                            colonoscopy: 2016 Medicines:                Propofol per Anesthesia, Monitored Anesthesia Care Procedure:                Pre-Anesthesia Assessment:                           - Prior to the procedure, a History and Physical                            was performed, and patient medications and                            allergies were reviewed. The patient's tolerance of                            previous anesthesia was also reviewed. The risks                            and benefits of the procedure and the sedation                            options and risks were discussed with the patient.                            All questions were answered, and informed consent                            was obtained. Prior Anticoagulants: The patient has                            taken no previous anticoagulant or antiplatelet                            agents. ASA Grade Assessment: II - A patient with                            mild systemic disease. After reviewing the risks                            and benefits, the patient was deemed in  satisfactory condition to undergo the procedure.                           After obtaining informed consent, the colonoscope                            was passed under direct vision. Throughout the                            procedure, the patient's blood pressure, pulse, and                            oxygen saturations were monitored continuously. The                            Colonoscope was  introduced through the anus and                            advanced to the the cecum, identified by                            appendiceal orifice and ileocecal valve. The                            colonoscopy was performed without difficulty. The                            patient tolerated the procedure well. The quality                            of the bowel preparation was adequate. The                            ileocecal valve, appendiceal orifice, and rectum                            were photographed. The bowel preparation used was                            Miralax. Scope In: 8:03:58 AM Scope Out: 8:21:11 AM Scope Withdrawal Time: 0 hours 14 minutes 50 seconds  Total Procedure Duration: 0 hours 17 minutes 13 seconds  Findings:                 The perianal and digital rectal examinations were                            normal.                           A diminutive polyp was found in the transverse                            colon. The polyp was sessile. The polyp was removed  with a cold snare. Resection and retrieval were                            complete. Verification of patient identification                            for the specimen was done. Estimated blood loss was                            minimal.                           The exam was otherwise without abnormality on                            direct and retroflexion views. Complications:            No immediate complications. Estimated Blood Loss:     Estimated blood loss was minimal. Impression:               - One diminutive polyp in the transverse colon,                            removed with a cold snare. Resected and retrieved.                           - The examination was otherwise normal on direct                            and retroflexion views.                           - Personal history of colonic polyps. 2 adenomas                            max 15 mm  2016 Recommendation:           - Patient has a contact number available for                            emergencies. The signs and symptoms of potential                            delayed complications were discussed with the                            patient. Return to normal activities tomorrow.                            Written discharge instructions were provided to the                            patient.                           - Resume previous diet.                           -  Continue present medications.                           - Repeat colonoscopy is recommended. The                            colonoscopy date will be determined after pathology                            results from today's exam become available for                            review.                           Her husband said she had been having diarrhea and                            incontinenvce with negative stool studies and                            despite stopping metformin (told in recovery)                           Will try cholestyramine and she will contact me                            about effectiveness. Gatha Mayer, MD 03/04/2019 8:30:15 AM This report has been signed electronically.

## 2019-03-05 ENCOUNTER — Telehealth: Payer: Self-pay

## 2019-03-05 NOTE — Telephone Encounter (Signed)
  Follow up Call-  Call back number 03/04/2019 07/03/2017  Post procedure Call Back phone  # [phone]  Permission to leave phone message Yes Yes  Some recent data might be hidden     Patient questions:  Do you have a fever, pain , or abdominal swelling? No. Pain Score  0 *  Have you tolerated food without any problems? Yes.    Have you been able to return to your normal activities? Yes.    Do you have any questions about your discharge instructions: Diet   No. Medications  No. Follow up visit  No.  Do you have questions or concerns about your Care? No.  Actions: * If pain score is 4 or above: No action needed, pain <4. Spoke with patient's husband because patient was asleep.  Husband denied any problems.

## 2019-03-07 ENCOUNTER — Encounter: Payer: Self-pay | Admitting: Internal Medicine

## 2019-03-07 NOTE — Progress Notes (Signed)
Diminutive ssp Recall 2025

## 2019-03-21 ENCOUNTER — Other Ambulatory Visit: Payer: Self-pay | Admitting: "Endocrinology

## 2019-04-14 ENCOUNTER — Other Ambulatory Visit: Payer: Self-pay | Admitting: "Endocrinology

## 2019-10-03 ENCOUNTER — Other Ambulatory Visit: Payer: Self-pay | Admitting: Cardiovascular Disease

## 2019-11-15 DIAGNOSIS — D649 Anemia, unspecified: Secondary | ICD-10-CM | POA: Insufficient documentation

## 2019-11-15 DIAGNOSIS — R600 Localized edema: Secondary | ICD-10-CM

## 2019-11-15 DIAGNOSIS — E1165 Type 2 diabetes mellitus with hyperglycemia: Secondary | ICD-10-CM

## 2019-11-15 DIAGNOSIS — F329 Major depressive disorder, single episode, unspecified: Secondary | ICD-10-CM | POA: Insufficient documentation

## 2019-11-15 DIAGNOSIS — K589 Irritable bowel syndrome without diarrhea: Secondary | ICD-10-CM

## 2019-11-15 DIAGNOSIS — A0472 Enterocolitis due to Clostridium difficile, not specified as recurrent: Secondary | ICD-10-CM | POA: Insufficient documentation

## 2019-11-15 DIAGNOSIS — K2100 Gastro-esophageal reflux disease with esophagitis, without bleeding: Secondary | ICD-10-CM | POA: Insufficient documentation

## 2020-02-03 ENCOUNTER — Other Ambulatory Visit: Payer: Self-pay

## 2020-02-03 ENCOUNTER — Ambulatory Visit (INDEPENDENT_AMBULATORY_CARE_PROVIDER_SITE_OTHER): Payer: Medicare HMO | Admitting: Family Medicine

## 2020-02-03 ENCOUNTER — Encounter: Payer: Self-pay | Admitting: Family Medicine

## 2020-02-03 VITALS — BP 140/68 | HR 68 | Temp 98.3°F | Ht 65.0 in | Wt 148.6 lb

## 2020-02-03 DIAGNOSIS — E1165 Type 2 diabetes mellitus with hyperglycemia: Secondary | ICD-10-CM

## 2020-02-03 DIAGNOSIS — E782 Mixed hyperlipidemia: Secondary | ICD-10-CM | POA: Diagnosis not present

## 2020-02-03 DIAGNOSIS — G4700 Insomnia, unspecified: Secondary | ICD-10-CM

## 2020-02-03 DIAGNOSIS — F32A Depression, unspecified: Secondary | ICD-10-CM

## 2020-02-03 DIAGNOSIS — D509 Iron deficiency anemia, unspecified: Secondary | ICD-10-CM

## 2020-02-03 DIAGNOSIS — K2100 Gastro-esophageal reflux disease with esophagitis, without bleeding: Secondary | ICD-10-CM

## 2020-02-03 DIAGNOSIS — I10 Essential (primary) hypertension: Secondary | ICD-10-CM

## 2020-02-03 DIAGNOSIS — F329 Major depressive disorder, single episode, unspecified: Secondary | ICD-10-CM | POA: Diagnosis not present

## 2020-02-03 MED ORDER — TOUJEO SOLOSTAR 300 UNIT/ML ~~LOC~~ SOPN: 60.0000 [IU] | PEN_INJECTOR | SUBCUTANEOUS | 0 refills | Status: DC

## 2020-02-03 NOTE — Patient Instructions (Addendum)
COVID-19 Vaccine Information can be found at: [email or web address].uk For questions related to vaccine distribution or appointments, please email vaccine@[email or web address] .[email or web address] or call [phone].    Trial Melatonin 0.3 -take at evening meal. Avoid TV/radio. Read or listen to music to help with sleep.    Write down glucose readings to manage blood glucose with insulin  Fasting blood work prior to next appointment

## 2020-02-03 NOTE — Progress Notes (Signed)
New Patient Office Visit  Subjective:  Patient ID: [name] [name], female    DOB: 04/25/1951  Age: 69 y.o. MRN: [id number]  CC:  Chief Complaint  Patient presents with  . Establish Care  anemia-no recent blood work-65mg  daily Anxiety GAD 2 Depression-PHQ9-11-zoloft 100mg  daily Diabetes-taking Toujeo 60units, glucotrol 5mg  BID MVP-echo in the past HTN-lisinopril daily HPI [name] [name] presents for DM-60units at night-Toujeo-DM diagnosis 1 year ago-switched to [facility] -unsure when medication started.  Pt has glucose monitor at home  Anemia-takes iron daily, pt has pica-eating ice,  HTN-pt with with no headaches, dizziness Pt states she staggers when she walks with "weak spells" no falls Difficulty with going to sleep due to anxiety  Past Medical History:  Diagnosis Date  . Anemia, unspecified   . Anxiety   . Anxiety   . Arthritis   . Constipation 03/23/2013  . Depression   . Depression   . Diabetes mellitus type II   . Elevated cholesterol   . Enterocolitis due to Clostridium difficile, not specified as recurrent   . Gastro-esophageal reflux disease with esophagitis   . Helicobacter pylori gastritis 07/10/2017   Dx EGD -  1) Omeprazole 20 mg 2 times a day x 14 d 2) Pepto Bismol 2 tabs (262 mg each) 4 times a day x 14 d 3) Metronidazole 250 mg 4 times a day x 14 d 4) doxycycline 100 mg 2 times a day x 14 d  After 14 d stop omeprazole also  In 4 weeks after treatment completed do H. Pylori stool antigen - dx H. Pylori gastritis   . Hx of adenomatous colonic polyps 10/05/2015  . Hyperlipemia 02/21/09   NUC STRESS TEST-NORMAL- EF 74%  . Iron deficiency anemia   . Irritable bowel syndrome   . Irritable bowel syndrome without diarrhea   . Localized edema   . Major depressive disorder, single episode, unspecified   . MVP (mitral valve prolapse)   . Rectocele   . Type 2 diabetes mellitus with hyperglycemia [facility])     Past Surgical History:  Procedure Laterality Date  .  ABDOMINAL HYSTERECTOMY    . BILATERAL SALPINGOOPHORECTOMY    . carpel tunnel Bilateral 12/17/2009  . COLONOSCOPY    . ESOPHAGOGASTRODUODENOSCOPY    . TUBAL LIGATION      Family History  Problem Relation Age of Onset  . Anxiety disorder Mother   . ADD / ADHD Brother   . Alcohol abuse Brother        x 3  . Drug abuse Brother   . Anxiety disorder Maternal Aunt   . Anxiety disorder Maternal Grandmother   . Bipolar disorder Other   . Diabetes Maternal Uncle   . Dementia Neg Hx   . OCD Neg Hx   . Paranoid behavior Neg Hx   . Physical abuse Neg Hx   . Schizophrenia Neg Hx   . Seizures Neg Hx   . Sexual abuse Neg Hx   . Colon cancer Neg Hx   . Colon polyps Neg Hx   . Pancreatic cancer Neg Hx   . Esophageal cancer Neg Hx   . Stomach cancer Neg Hx   . Kidney disease Neg Hx   . Liver disease Neg Hx   . Rectal cancer Neg Hx     Social History   Socioeconomic History  . Marital status: Married    Spouse name: Not on file  . Number of children: Not on file  . Years of  education: Not on file  . Highest education level: Not on file  Occupational History  . Not on file  Tobacco Use  . Smoking status: Former Smoker    Years: 30.00    Types: Cigarettes    Quit date: 09/03/1997    Years since quitting: 22.4  . Smokeless tobacco: Current User    Types: Snuff  . Tobacco comment: call when ready to quit  Substance and Sexual Activity  . Alcohol use: No    Alcohol/week: 0.0 standard drinks  . Drug use: No  . Sexual activity: Never    Birth control/protection: Surgical  Other Topics Concern  . Not on file  Social History Narrative   Married 2 sons   She is a housewife   No caffeine   09/23/2015   Social Determinants of Health   Financial Resource Strain:   . Difficulty of Paying Living Expenses: Not on file  Food Insecurity:   . Worried About Charity fundraiser in the Last Year: Not on file  . Ran Out of Food in the Last Year: Not on file  Transportation Needs:   .  Lack of Transportation (Medical): Not on file  . Lack of Transportation (Non-Medical): Not on file  Physical Activity:   . Days of Exercise per Week: Not on file  . Minutes of Exercise per Session: Not on file  Stress:   . Feeling of Stress : Not on file  Social Connections:   . Frequency of Communication with Friends and Family: Not on file  . Frequency of Social Gatherings with Friends and Family: Not on file  . Attends Religious Services: Not on file  . Active Member of Clubs or Organizations: Not on file  . Attends Archivist Meetings: Not on file  . Marital Status: Not on file  Intimate Partner Violence:   . Fear of Current or Ex-Partner: Not on file  . Emotionally Abused: Not on file  . Physically Abused: Not on file  . Sexually Abused: Not on file    ROS Review of Systems  Constitutional: Negative.   HENT: Negative.   Eyes: Negative.   Respiratory: Negative.   Cardiovascular: Positive for palpitations.  Gastrointestinal: Positive for diarrhea.       Incontinence of stool  Endocrine: Positive for polyphagia.  Skin: Negative.   Allergic/Immunologic: Negative.   Neurological: Positive for weakness.  Psychiatric/Behavioral: Positive for dysphoric mood and sleep disturbance. The patient is nervous/anxious.     Objective:   Today's Vitals: BP 140/68 (BP Location: Left Arm, Patient Position: Sitting)   Pulse 68   Temp 98.3 F (36.8 C) (Temporal)   Ht 5\' 5"  (1.651 m)   Wt 148 lb 9.6 oz (67.4 kg)   SpO2 96%   BMI 24.73 kg/m   Physical Exam Constitutional:      Appearance: Normal appearance.  HENT:     Head: Normocephalic and atraumatic.  Cardiovascular:     Rate and Rhythm: Normal rate and regular rhythm.     Pulses: Normal pulses.     Heart sounds: Normal heart sounds.  Pulmonary:     Effort: Pulmonary effort is normal.     Breath sounds: Normal breath sounds.  Musculoskeletal:     Cervical back: Normal range of motion and neck supple.   Neurological:     Mental Status: She is alert.  Psychiatric:        Mood and Affect: Mood normal.        Behavior:  Behavior normal.     Assessment & Plan:  1. Essential hypertension, benign lisinopril - TSH Renal function pending for stability 2. Type 2 diabetes mellitus with hyperglycemia, unspecified whether long term insulin use (HCC) No current glucose readings-pt asked to write glucose readings down for evaluation - COMPLETE METABOLIC PANEL WITH GFR - Hemoglobin A1c - Microalbumin, urine Glucotrol/ 3. Iron deficiency anemia, unspecified iron deficiency anemia type - CBC w/Diff/Platelet - Fe+TIBC+Fer Taking iron daily 4. Gastroesophageal reflux disease with esophagitis, unspecified whether hemorrhage No meds currently  5. Mixed hyperlipidemia crestor-stable - Lipid panel  6. Depression, unspecified depression type zoloft-stable-PHQ9-11  7. Insomnia, unspecified type Trial of melatonin  Outpatient Encounter Medications as of 02/03/2020  Medication Sig  . atenolol (TENORMIN) 50 MG tablet TAKE 1 TABLET BY MOUTH TWICE A DAY  . BIOTIN PO Take 500 mg by mouth daily.   . cholestyramine light (PREVALITE) 4 g packet Take 1 packet (4 g total) by mouth 2 (two) times daily.  . ferrous sulfate 325 (65 FE) MG tablet Take 325 mg by mouth daily with breakfast.  . glipiZIDE (GLUCOTROL) 5 MG tablet Take 5 mg by mouth 2 (two) times daily.  . Insulin Glargine (TOUJEO SOLOSTAR) 300 UNIT/ML SOPN Inject 50 Units into the skin at bedtime. (Patient taking differently: Inject 60 Units into the skin at bedtime. )  . Insulin Pen Needle 32G X 4 MM MISC 1 EACH BY DOES NOT APPLY ROUTE 4 (FOUR) TIMES DAILY.  Marland Kitchen lisinopril (PRINIVIL,ZESTRIL) 10 MG tablet Take 1 tablet (10 mg total) by mouth daily.  . metFORMIN (GLUCOPHAGE) 500 MG tablet Take 500 mg by mouth 2 (two) times daily with a meal.  . Multiple Vitamins-Minerals (HAIR SKIN AND NAILS FORMULA) TABS Take 1 tablet by mouth 2 (two) times daily.   . rosuvastatin (CRESTOR) 5 MG tablet Take 5 mg by mouth daily. Takes one on Monday, Wed., and Friday  . sertraline (ZOLOFT) 100 MG tablet Take 1 tablet (100 mg total) by mouth 2 (two) times daily. (Patient taking differently: Take 100 mg by mouth daily. )  . [DISCONTINUED] dicyclomine (BENTYL) 20 MG tablet Take 20 mg by mouth 3 (three) times daily.   . [DISCONTINUED] TOUJEO SOLOSTAR 300 UNIT/ML SOPN INJECT 50 UNITS INTO THE SKIN AT BEDTIME   No facility-administered encounter medications on file as of 02/03/2020.   One hour spent with history, family history, timing of disease onset, physical exam, assessment and plan Daniya Aramburo Hannah Beat, MD

## 2020-02-09 DIAGNOSIS — E1165 Type 2 diabetes mellitus with hyperglycemia: Secondary | ICD-10-CM | POA: Diagnosis not present

## 2020-02-09 DIAGNOSIS — I10 Essential (primary) hypertension: Secondary | ICD-10-CM | POA: Diagnosis not present

## 2020-02-09 DIAGNOSIS — D509 Iron deficiency anemia, unspecified: Secondary | ICD-10-CM | POA: Diagnosis not present

## 2020-02-10 ENCOUNTER — Other Ambulatory Visit: Payer: Self-pay | Admitting: Family Medicine

## 2020-02-10 DIAGNOSIS — D696 Thrombocytopenia, unspecified: Secondary | ICD-10-CM

## 2020-02-10 DIAGNOSIS — E1165 Type 2 diabetes mellitus with hyperglycemia: Secondary | ICD-10-CM

## 2020-02-10 LAB — CBC WITH DIFFERENTIAL/PLATELET
Absolute Monocytes: 386 cells/uL (ref 200–950)
Basophils Absolute: 31 cells/uL (ref 0–200)
Basophils Relative: 0.8 %
Eosinophils Absolute: 199 cells/uL (ref 15–500)
Eosinophils Relative: 5.1 %
HCT: 35.3 % (ref 35.0–45.0)
Hemoglobin: 11.9 g/dL (ref 11.7–15.5)
Lymphs Abs: 1115 cells/uL (ref 850–3900)
MCH: 28.8 pg (ref 27.0–33.0)
MCHC: 33.7 g/dL (ref 32.0–36.0)
MCV: 85.5 fL (ref 80.0–100.0)
MPV: 11.4 fL (ref 7.5–12.5)
Monocytes Relative: 9.9 %
Neutro Abs: 2168 cells/uL (ref 1500–7800)
Neutrophils Relative %: 55.6 %
Platelets: 69 10*3/uL — ABNORMAL LOW (ref 140–400)
RBC: 4.13 10*6/uL (ref 3.80–5.10)
RDW: 15.7 % — ABNORMAL HIGH (ref 11.0–15.0)
Total Lymphocyte: 28.6 %
WBC: 3.9 10*3/uL (ref 3.8–10.8)

## 2020-02-10 LAB — COMPLETE METABOLIC PANEL WITH GFR
AG Ratio: 1.3 (calc) (ref 1.0–2.5)
ALT: 21 U/L (ref 6–29)
AST: 32 U/L (ref 10–35)
Albumin: 3.9 g/dL (ref 3.6–5.1)
Alkaline phosphatase (APISO): 44 U/L (ref 37–153)
BUN: 10 mg/dL (ref 7–25)
CO2: 26 mmol/L (ref 20–32)
Calcium: 9.3 mg/dL (ref 8.6–10.4)
Chloride: 105 mmol/L (ref 98–110)
Creat: 0.78 mg/dL (ref 0.50–0.99)
GFR, Est African American: 92 mL/min/{1.73_m2} (ref >=60)
GFR, Est Non African American: 79 mL/min/{1.73_m2} (ref >=60)
Globulin: 3.1 g/dL (calc) (ref 1.9–3.7)
Glucose, Bld: 93 mg/dL (ref 65–99)
Potassium: 3.8 mmol/L (ref 3.5–5.3)
Sodium: 142 mmol/L (ref 135–146)
Total Bilirubin: 0.8 mg/dL (ref 0.2–1.2)
Total Protein: 7 g/dL (ref 6.1–8.1)

## 2020-02-10 LAB — TSH: TSH: 2.13 mIU/L (ref 0.40–4.50)

## 2020-02-10 LAB — IRON,TIBC AND FERRITIN PANEL
%SAT: 16 % (calc) (ref 16–45)
Ferritin: 43 ng/mL (ref 16–288)
Iron: 58 ug/dL (ref 45–160)
TIBC: 362 mcg/dL (calc) (ref 250–450)

## 2020-02-10 LAB — HEMOGLOBIN A1C
Hgb A1c MFr Bld: 8.7 % of total Hgb — ABNORMAL HIGH (ref <5.7)
Mean Plasma Glucose: 203 (calc)
eAG (mmol/L): 11.2 (calc)

## 2020-02-16 ENCOUNTER — Ambulatory Visit (INDEPENDENT_AMBULATORY_CARE_PROVIDER_SITE_OTHER): Payer: Medicare HMO | Admitting: Family Medicine

## 2020-02-16 ENCOUNTER — Encounter: Payer: Self-pay | Admitting: Family Medicine

## 2020-02-16 ENCOUNTER — Other Ambulatory Visit: Payer: Self-pay

## 2020-02-16 VITALS — BP 168/78 | HR 59 | Temp 97.9°F | Wt 147.0 lb

## 2020-02-16 DIAGNOSIS — I10 Essential (primary) hypertension: Secondary | ICD-10-CM

## 2020-02-16 DIAGNOSIS — K2100 Gastro-esophageal reflux disease with esophagitis, without bleeding: Secondary | ICD-10-CM | POA: Diagnosis not present

## 2020-02-16 DIAGNOSIS — D696 Thrombocytopenia, unspecified: Secondary | ICD-10-CM | POA: Diagnosis not present

## 2020-02-16 DIAGNOSIS — F32A Depression, unspecified: Secondary | ICD-10-CM

## 2020-02-16 DIAGNOSIS — E782 Mixed hyperlipidemia: Secondary | ICD-10-CM

## 2020-02-16 DIAGNOSIS — F329 Major depressive disorder, single episode, unspecified: Secondary | ICD-10-CM | POA: Diagnosis not present

## 2020-02-16 DIAGNOSIS — D509 Iron deficiency anemia, unspecified: Secondary | ICD-10-CM

## 2020-02-16 DIAGNOSIS — E1165 Type 2 diabetes mellitus with hyperglycemia: Secondary | ICD-10-CM | POA: Diagnosis not present

## 2020-02-16 MED ORDER — LISINOPRIL 10 MG PO TABS: 10.0000 mg | ORAL_TABLET | Freq: Every day | ORAL | 3 refills | Status: DC

## 2020-02-16 MED ORDER — GLIPIZIDE 10 MG PO TABS: 10.0000 mg | ORAL_TABLET | Freq: Two times a day (BID) | ORAL | 3 refills | Status: DC

## 2020-02-16 MED ORDER — SERTRALINE HCL 100 MG PO TABS: ORAL_TABLET | ORAL | 2 refills | Status: DC

## 2020-02-16 NOTE — Progress Notes (Signed)
Established Patient Office Visit  Subjective:  Patient ID: [name] [name], female    DOB: 12-Jun-1951  Age: 69 y.o. MRN: [id number]  CC:  Chief Complaint  Patient presents with  . Hypertension    2 week f/u    HPI [name] [name] presents for HTN follow up-taking atenolol and lisinopril daily.   DM-glucotrol and metformin daily-o side effects Hyperlipidemia-crestor daily-no concerns Depression-PHQ-9- 11  Past Medical History:  Diagnosis Date  . Anemia, unspecified   . Anxiety   . Anxiety   . Arthritis   . Constipation 03/23/2013  . Depression   . Depression   . Diabetes mellitus type II   . Elevated cholesterol   . Enterocolitis due to Clostridium difficile, not specified as recurrent   . Gastro-esophageal reflux disease with esophagitis   . Helicobacter pylori gastritis 07/10/2017   Dx EGD -  1) Omeprazole 20 mg 2 times a day x 14 d 2) Pepto Bismol 2 tabs (262 mg each) 4 times a day x 14 d 3) Metronidazole 250 mg 4 times a day x 14 d 4) doxycycline 100 mg 2 times a day x 14 d  After 14 d stop omeprazole also  In 4 weeks after treatment completed do H. Pylori stool antigen - dx H. Pylori gastritis   . Hx of adenomatous colonic polyps 10/05/2015  . Hyperlipemia 02/21/09   NUC STRESS TEST-NORMAL- EF 74%  . Iron deficiency anemia   . Irritable bowel syndrome   . Irritable bowel syndrome without diarrhea   . Localized edema   . Major depressive disorder, single episode, unspecified   . MVP (mitral valve prolapse)   . Rectocele   . Type 2 diabetes mellitus with hyperglycemia [facility])     Past Surgical History:  Procedure Laterality Date  . ABDOMINAL HYSTERECTOMY    . BILATERAL SALPINGOOPHORECTOMY    . carpel tunnel Bilateral 12/17/2009  . COLONOSCOPY    . ESOPHAGOGASTRODUODENOSCOPY    . TUBAL LIGATION      Family History  Problem Relation Age of Onset  . Anxiety disorder Mother   . ADD / ADHD Brother   . Alcohol abuse Brother        x 3  . Drug abuse Brother   .  Anxiety disorder Maternal Aunt   . Anxiety disorder Maternal Grandmother   . Bipolar disorder Other   . Diabetes Maternal Uncle   . Dementia Neg Hx   . OCD Neg Hx   . Paranoid behavior Neg Hx   . Physical abuse Neg Hx   . Schizophrenia Neg Hx   . Seizures Neg Hx   . Sexual abuse Neg Hx   . Colon cancer Neg Hx   . Colon polyps Neg Hx   . Pancreatic cancer Neg Hx   . Esophageal cancer Neg Hx   . Stomach cancer Neg Hx   . Kidney disease Neg Hx   . Liver disease Neg Hx   . Rectal cancer Neg Hx     Social History   Socioeconomic History  . Marital status: Married    Spouse name: Not on file  . Number of children: Not on file  . Years of education: Not on file  . Highest education level: Not on file  Occupational History  . Not on file  Tobacco Use  . Smoking status: Former Smoker    Years: 30.00    Types: Cigarettes    Quit date: 09/03/1997    Years since quitting:  22.4  . Smokeless tobacco: Current User    Types: Snuff  . Tobacco comment: call when ready to quit  Substance and Sexual Activity  . Alcohol use: No    Alcohol/week: 0.0 standard drinks  . Drug use: No  . Sexual activity: Never    Birth control/protection: Surgical  Other Topics Concern  . Not on file  Social History Narrative   Married 2 sons   She is a housewife   No caffeine   09/23/2015   Social Determinants of Health   Financial Resource Strain:   . Difficulty of Paying Living Expenses: Not on file  Food Insecurity:   . Worried About Charity fundraiser in the Last Year: Not on file  . Ran Out of Food in the Last Year: Not on file  Transportation Needs:   . Lack of Transportation (Medical): Not on file  . Lack of Transportation (Non-Medical): Not on file  Physical Activity:   . Days of Exercise per Week: Not on file  . Minutes of Exercise per Session: Not on file  Stress:   . Feeling of Stress : Not on file  Social Connections:   . Frequency of Communication with Friends and Family: Not  on file  . Frequency of Social Gatherings with Friends and Family: Not on file  . Attends Religious Services: Not on file  . Active Member of Clubs or Organizations: Not on file  . Attends Archivist Meetings: Not on file  . Marital Status: Not on file  Intimate Partner Violence:   . Fear of Current or Ex-Partner: Not on file  . Emotionally Abused: Not on file  . Physically Abused: Not on file  . Sexually Abused: Not on file    Outpatient Medications Prior to Visit  Medication Sig Dispense Refill  . atenolol (TENORMIN) 50 MG tablet TAKE 1 TABLET BY MOUTH TWICE A DAY 120 tablet 0  . BIOTIN PO Take 500 mg by mouth daily.     . cholestyramine light (PREVALITE) 4 g packet Take 1 packet (4 g total) by mouth 2 (two) times daily. 60 packet 5  . ferrous sulfate 325 (65 FE) MG tablet Take 325 mg by mouth daily with breakfast.    . glipiZIDE (GLUCOTROL) 5 MG tablet Take 5 mg by mouth 2 (two) times daily.    . Insulin Pen Needle 32G X 4 MM MISC 1 EACH BY DOES NOT APPLY ROUTE 4 (FOUR) TIMES DAILY. 150 each 5  . metFORMIN (GLUCOPHAGE) 500 MG tablet Take 500 mg by mouth 2 (two) times daily with a meal.    . Multiple Vitamins-Minerals (HAIR SKIN AND NAILS FORMULA) TABS Take 1 tablet by mouth 2 (two) times daily.    . rosuvastatin (CRESTOR) 5 MG tablet Take 5 mg by mouth daily. Takes one on Monday, Wed., and Friday    . sertraline (ZOLOFT) 100 MG tablet Take 1 tablet (100 mg total) by mouth 2 (two) times daily. (Patient taking differently: Take 100 mg by mouth daily. ) 180 tablet 2  . Insulin Glargine, 1 Unit Dial, (TOUJEO SOLOSTAR) 300 UNIT/ML SOPN Inject 60 Units into the skin 1 day or 1 dose for 1 dose. 4 pen 0  . lisinopril (PRINIVIL,ZESTRIL) 10 MG tablet Take 1 tablet (10 mg total) by mouth daily. 30 tablet 3   No facility-administered medications prior to visit.    Allergies  Allergen Reactions  . Codeine Other (See Comments)    Took it years ago and  doesn't remember the reaction     ROS Review of Systems  Eyes: Negative.   Respiratory: Negative.   Gastrointestinal: Negative.   Endocrine:       DM  Genitourinary: Negative.   Musculoskeletal: Negative.   Skin: Negative.   Allergic/Immunologic: Negative.   Neurological: Negative.   Psychiatric/Behavioral: Negative.       Objective:    Physical Exam  Constitutional: She is oriented to person, place, and time. She appears well-developed and well-nourished.  HENT:  Head: Normocephalic and atraumatic.  Eyes: Conjunctivae are normal.  Cardiovascular: Normal rate and regular rhythm.  Pulmonary/Chest: Effort normal and breath sounds normal.  Musculoskeletal:        General: Normal range of motion.  Neurological: She is oriented to person, place, and time.    BP (!) 168/78 (BP Location: Left Arm, Patient Position: Sitting)   Pulse (!) 59   Temp 97.9 F (36.6 C) (Temporal)   Wt 147 lb (66.7 kg)   SpO2 97%   BMI 24.46 kg/m  Wt Readings from Last 3 Encounters:  02/16/20 147 lb (66.7 kg)  02/03/20 148 lb 9.6 oz (67.4 kg)  03/04/19 142 lb (64.4 kg)     Health Maintenance Due  Topic Date Due  . OPHTHALMOLOGY EXAM  09/25/1963  . DEXA SCAN  09/24/2018  . FOOT EXAM  12/26/2018  . PNA vac Low Risk Adult (1 of 2 - PCV13) 02/19/2019  . INFLUENZA VACCINE  07/18/2019  . MAMMOGRAM  09/26/2019    Lab Results  Component Value Date   TSH 2.13 02/09/2020   Lab Results  Component Value Date   WBC 3.9 02/09/2020   HGB 11.9 02/09/2020   HCT 35.3 02/09/2020   MCV 85.5 02/09/2020   PLT 69 (L) 02/09/2020   Lab Results  Component Value Date   NA 142 02/09/2020   K 3.8 02/09/2020   CO2 26 02/09/2020   GLUCOSE 93 02/09/2020   BUN 10 02/09/2020   CREATININE 0.78 02/09/2020   BILITOT 0.8 02/09/2020   ALKPHOS 52 09/13/2014   AST 32 02/09/2020   ALT 21 02/09/2020   PROT 7.0 02/09/2020   ALBUMIN 4.2 09/13/2014   CALCIUM 9.3 02/09/2020   Lab Results  Component Value Date   CHOL 168 12/02/2018    Lab Results  Component Value Date   HDL 55 12/02/2018   Lab Results  Component Value Date   LDLCALC 95 12/02/2018   Lab Results  Component Value Date   TRIG 87 12/02/2018   Lab Results  Component Value Date   CHOLHDL 2.9 04/16/2018   Lab Results  Component Value Date   HGBA1C 8.7 (H) 02/09/2020      Assessment & Plan:  1. THROMBOCYTOPENIA Hematology referral for evaluation-platelets 69 2. Type 2 diabetes mellitus with hyperglycemia, unspecified whether long term insulin use (HCC) 8.7 A1c-endocrinology/diabetic educator-goal 7.0 Increase glucotrol 10mg  BID-continue Toujeo  At night  3. Essential hypertension, benign Concern bp elevated-take bp readings Restart lisinopril-kidney protection and lowers blood pressure 4. Iron deficiency anemia, unspecified iron deficiency anemia type -hematology referral  5. Gastroesophageal reflux disease with esophagitis, unspecified whether hemorrhage  6. Mixed hyperlipidemia-continue Crestor daily-pt needs to complete lipid panel. lft normal  7. Depression-take zoloft 100mg  1.5mg  daily Follow-up: 2 weeks follow up-HTN    Anahli Arvanitis Hannah Beat, MD

## 2020-02-16 NOTE — Patient Instructions (Addendum)
Glucotrol 10mg  BID-increase dose  Zoloft 100-take 1.5 daily  Continue Toujeo nightly 60unites  Re-start lisinopril daily- Check blood pressure every morning

## 2020-02-18 ENCOUNTER — Ambulatory Visit (HOSPITAL_COMMUNITY): Payer: Medicare HMO | Admitting: Hematology

## 2020-02-21 ENCOUNTER — Encounter: Payer: Self-pay | Admitting: Family Medicine

## 2020-02-29 ENCOUNTER — Encounter (HOSPITAL_COMMUNITY): Payer: Self-pay

## 2020-03-01 ENCOUNTER — Encounter (HOSPITAL_COMMUNITY): Payer: Self-pay | Admitting: *Deleted

## 2020-03-01 ENCOUNTER — Other Ambulatory Visit: Payer: Self-pay

## 2020-03-01 ENCOUNTER — Inpatient Hospital Stay (HOSPITAL_COMMUNITY): Payer: Medicare HMO | Attending: Hematology | Admitting: Hematology

## 2020-03-01 ENCOUNTER — Encounter (HOSPITAL_COMMUNITY): Payer: Self-pay | Admitting: Hematology

## 2020-03-01 ENCOUNTER — Inpatient Hospital Stay (HOSPITAL_COMMUNITY): Payer: Medicare HMO

## 2020-03-01 DIAGNOSIS — Z833 Family history of diabetes mellitus: Secondary | ICD-10-CM | POA: Insufficient documentation

## 2020-03-01 DIAGNOSIS — F419 Anxiety disorder, unspecified: Secondary | ICD-10-CM | POA: Diagnosis not present

## 2020-03-01 DIAGNOSIS — E785 Hyperlipidemia, unspecified: Secondary | ICD-10-CM | POA: Diagnosis not present

## 2020-03-01 DIAGNOSIS — Z87891 Personal history of nicotine dependence: Secondary | ICD-10-CM | POA: Insufficient documentation

## 2020-03-01 DIAGNOSIS — D472 Monoclonal gammopathy: Secondary | ICD-10-CM

## 2020-03-01 DIAGNOSIS — F329 Major depressive disorder, single episode, unspecified: Secondary | ICD-10-CM | POA: Diagnosis not present

## 2020-03-01 DIAGNOSIS — D696 Thrombocytopenia, unspecified: Secondary | ICD-10-CM | POA: Diagnosis not present

## 2020-03-01 DIAGNOSIS — E7800 Pure hypercholesterolemia, unspecified: Secondary | ICD-10-CM

## 2020-03-01 DIAGNOSIS — Z794 Long term (current) use of insulin: Secondary | ICD-10-CM

## 2020-03-01 DIAGNOSIS — Z8349 Family history of other endocrine, nutritional and metabolic diseases: Secondary | ICD-10-CM | POA: Diagnosis not present

## 2020-03-01 DIAGNOSIS — E119 Type 2 diabetes mellitus without complications: Secondary | ICD-10-CM | POA: Diagnosis not present

## 2020-03-01 DIAGNOSIS — D649 Anemia, unspecified: Secondary | ICD-10-CM | POA: Diagnosis not present

## 2020-03-01 DIAGNOSIS — K760 Fatty (change of) liver, not elsewhere classified: Secondary | ICD-10-CM | POA: Diagnosis not present

## 2020-03-01 DIAGNOSIS — Z79899 Other long term (current) drug therapy: Secondary | ICD-10-CM

## 2020-03-01 LAB — CBC WITH DIFFERENTIAL/PLATELET
Abs Immature Granulocytes: 0.01 10*3/uL (ref 0.00–0.07)
Basophils Absolute: 0 10*3/uL (ref 0.0–0.1)
Basophils Relative: 1 %
Eosinophils Absolute: 0 10*3/uL (ref 0.0–0.5)
Eosinophils Relative: 0 %
HCT: 38.7 % (ref 36.0–46.0)
Hemoglobin: 12.4 g/dL (ref 12.0–15.0)
Immature Granulocytes: 0 %
Lymphocytes Relative: 22 %
Lymphs Abs: 0.9 10*3/uL (ref 0.7–4.0)
MCH: 28.4 pg (ref 26.0–34.0)
MCHC: 32 g/dL (ref 30.0–36.0)
MCV: 88.6 fL (ref 80.0–100.0)
Monocytes Absolute: 0.3 10*3/uL (ref 0.1–1.0)
Monocytes Relative: 8 %
Neutro Abs: 3 10*3/uL (ref 1.7–7.7)
Neutrophils Relative %: 69 %
Platelets: 63 10*3/uL — ABNORMAL LOW (ref 150–400)
RBC: 4.37 MIL/uL (ref 3.87–5.11)
RDW: 15.1 % (ref 11.5–15.5)
WBC: 4.3 10*3/uL (ref 4.0–10.5)
nRBC: 0 % (ref 0.0–0.2)

## 2020-03-01 LAB — VITAMIN D 25 HYDROXY (VIT D DEFICIENCY, FRACTURES): Vit D, 25-Hydroxy: 26.29 ng/mL — ABNORMAL LOW (ref 30–100)

## 2020-03-01 LAB — IRON AND TIBC
Iron: 64 ug/dL (ref 28–170)
Saturation Ratios: 13 % (ref 10.4–31.8)
TIBC: 480 ug/dL — ABNORMAL HIGH (ref 250–450)
UIBC: 416 ug/dL

## 2020-03-01 LAB — HEPATITIS B SURFACE ANTIGEN: Hepatitis B Surface Ag: NONREACTIVE

## 2020-03-01 LAB — FOLATE: Folate: 18 ng/mL (ref >5.9)

## 2020-03-01 LAB — HEPATITIS B SURFACE ANTIBODY,QUALITATIVE: Hep B S Ab: NONREACTIVE

## 2020-03-01 LAB — HIV ANTIBODY (ROUTINE TESTING W REFLEX): HIV Screen 4th Generation wRfx: NONREACTIVE

## 2020-03-01 LAB — VITAMIN B12: Vitamin B-12: 487 pg/mL (ref 180–914)

## 2020-03-01 LAB — HEPATITIS B CORE ANTIBODY, TOTAL: Hep B Core Total Ab: NONREACTIVE

## 2020-03-01 LAB — FERRITIN: Ferritin: 31 ng/mL (ref 11–307)

## 2020-03-01 LAB — LACTATE DEHYDROGENASE: LDH: 203 U/L — ABNORMAL HIGH (ref 98–192)

## 2020-03-01 NOTE — Patient Instructions (Signed)
[facility] at [facility] Discharge Instructions  Follow up in 2-3 weeks   Thank you for choosing [facility] at [facility] to provide your oncology and hematology care.  To afford each patient quality time with our provider, please arrive at least 15 minutes before your scheduled appointment time.   If you have a lab appointment with the Cancer Center please come in thru the Main Entrance and check in at the main information desk.  You need to re-schedule your appointment should you arrive 10 or more minutes late.  We strive to give you quality time with our providers, and arriving late affects you and other patients whose appointments are after yours.  Also, if you no show three or more times for appointments you may be dismissed from the clinic at the providers discretion.     Again, thank you for choosing [facility].  Our hope is that these requests will decrease the amount of time that you wait before being seen by our physicians.       _____________________________________________________________  Should you have questions after your visit to [facility], please contact our office at [phone] between the hours of 8:00 a.m. and 4:30 p.m.  Voicemails left after 4:00 p.m. will not be returned until the following business day.  For prescription refill requests, have your pharmacy contact our office and allow 72 hours.    Due to Covid, you will need to wear a mask upon entering the hospital. If you do not have a mask, a mask will be given to you at the Main Entrance upon arrival. For doctor visits, patients may have 1 support person with them. For treatment visits, patients can not have anyone with them due to social distancing guidelines and our immunocompromised population.      

## 2020-03-01 NOTE — Progress Notes (Signed)
CONSULT NOTE  Patient Care Team: Corum, Rex Kras, MD as PCP - General (Family Medicine)  CHIEF COMPLAINTS/PURPOSE OF CONSULTATION: Thrombocytopenia  HISTORY OF PRESENTING ILLNESS:  [name] [name] 69 y.o. female was sent here by her PCP for thrombocytopenia.  It is documented that the patient has had thrombocytopenia since 2015.  Patient reports she was recently told she was anemic as well.  She was started on oral iron tablets daily.  She is tolerating these well with no GI upset.  She also reports that she has been told she had a fatty liver.  She denies any history of hepatitis.  She denies any B symptoms.  She denies any rash including petechiae.  She reports she does not drink any alcohol, smoke or use any illicit drugs.  She denies any bright red bleeding per rectum or melena.  She denies any hematemesis, hematuria or hematochezia.  She denies any over-the-counter NSAID ingestion.  She is not on any antiplatelets.  She denies any use of steroids or antibiotics recently.  She has never had a history of blood clots.  She denies any tick bites.  Patient denies any prior history or diagnosis of cancer.  Patient denies ever receiving a blood transfusion.  Patient denies any easy bruising or bleeding.  Patient reports she has a family history of a brother with throat cancer.  Patient lives alone and has work from home most of her adult life.    MEDICAL HISTORY:  Past Medical History:  Diagnosis Date  . Anemia, unspecified   . Anxiety   . Anxiety   . Arthritis   . Constipation 03/23/2013  . Depression   . Depression   . Diabetes mellitus type II   . Elevated cholesterol   . Enterocolitis due to Clostridium difficile, not specified as recurrent   . Gastro-esophageal reflux disease with esophagitis   . Helicobacter pylori gastritis 07/10/2017   Dx EGD -  1) Omeprazole 20 mg 2 times a day x 14 d 2) Pepto Bismol 2 tabs (262 mg each) 4 times a day x 14 d 3) Metronidazole 250 mg 4 times a day x  14 d 4) doxycycline 100 mg 2 times a day x 14 d  After 14 d stop omeprazole also  In 4 weeks after treatment completed do H. Pylori stool antigen - dx H. Pylori gastritis   . Hx of adenomatous colonic polyps 10/05/2015  . Hyperlipemia 02/21/09   NUC STRESS TEST-NORMAL- EF 74%  . Iron deficiency anemia   . Irritable bowel syndrome   . Irritable bowel syndrome without diarrhea   . Localized edema   . Major depressive disorder, single episode, unspecified   . MVP (mitral valve prolapse)   . Rectocele   . Type 2 diabetes mellitus with hyperglycemia ([place])     SURGICAL HISTORY: Past Surgical History:  Procedure Laterality Date  . ABDOMINAL HYSTERECTOMY    . BILATERAL SALPINGOOPHORECTOMY    . carpel tunnel Bilateral 12/17/2009  . COLONOSCOPY    . ESOPHAGOGASTRODUODENOSCOPY    . TUBAL LIGATION      SOCIAL HISTORY: Social History   Socioeconomic History  . Marital status: Married    Spouse name: Louie Casa  . Number of children: 2  . Years of education: Not on file  . Highest education level: Not on file  Occupational History  . Not on file  Tobacco Use  . Smoking status: Former Smoker    Years: 30.00    Types: Cigarettes  Quit date: 09/03/1997    Years since quitting: 22.5  . Smokeless tobacco: Current User    Types: Snuff  . Tobacco comment: call when ready to quit  Substance and Sexual Activity  . Alcohol use: No    Alcohol/week: 0.0 standard drinks  . Drug use: No  . Sexual activity: Never    Birth control/protection: Surgical  Other Topics Concern  . Not on file  Social History Narrative   Married 2 sons   She is a housewife   No caffeine   09/23/2015   Social Determinants of Health   Financial Resource Strain:   . Difficulty of Paying Living Expenses:   Food Insecurity:   . Worried About Charity fundraiser in the Last Year:   . Arboriculturist in the Last Year:   Transportation Needs:   . Film/video editor (Medical):   Marland Kitchen Lack of Transportation  (Non-Medical):   Physical Activity:   . Days of Exercise per Week:   . Minutes of Exercise per Session:   Stress:   . Feeling of Stress :   Social Connections:   . Frequency of Communication with Friends and Family:   . Frequency of Social Gatherings with Friends and Family:   . Attends Religious Services:   . Active Member of Clubs or Organizations:   . Attends Archivist Meetings:   Marland Kitchen Marital Status:   Intimate Partner Violence:   . Fear of Current or Ex-Partner:   . Emotionally Abused:   Marland Kitchen Physically Abused:   . Sexually Abused:     FAMILY HISTORY: Family History  Problem Relation Age of Onset  . Anxiety disorder Mother   . ADD / ADHD Brother   . Alcohol abuse Brother        x 3  . Drug abuse Brother   . Anxiety disorder Maternal Aunt   . Anxiety disorder Maternal Grandmother   . Bipolar disorder Other   . Emphysema Father   . Diabetes Sister   . Hyperlipidemia Sister   . Hyperlipidemia Sister   . Diabetes Sister   . Diabetes Maternal Uncle   . Dementia Neg Hx   . OCD Neg Hx   . Paranoid behavior Neg Hx   . Physical abuse Neg Hx   . Schizophrenia Neg Hx   . Seizures Neg Hx   . Sexual abuse Neg Hx   . Colon cancer Neg Hx   . Colon polyps Neg Hx   . Pancreatic cancer Neg Hx   . Esophageal cancer Neg Hx   . Stomach cancer Neg Hx   . Kidney disease Neg Hx   . Liver disease Neg Hx   . Rectal cancer Neg Hx     ALLERGIES:  is allergic to codeine.  MEDICATIONS:  Current Outpatient Medications  Medication Sig Dispense Refill  . atenolol (TENORMIN) 50 MG tablet TAKE 1 TABLET BY MOUTH TWICE A DAY 120 tablet 0  . BIOTIN PO Take 500 mg by mouth daily.     . cholestyramine light (PREVALITE) 4 g packet Take 1 packet (4 g total) by mouth 2 (two) times daily. 60 packet 5  . ferrous sulfate 325 (65 FE) MG tablet Take 325 mg by mouth daily with breakfast.    . glipiZIDE (GLUCOTROL) 10 MG tablet Take 1 tablet (10 mg total) by mouth 2 (two) times daily before a  meal. 60 tablet 3  . Insulin Glargine, 1 Unit Dial, (TOUJEO SOLOSTAR) 300 UNIT/ML  SOPN Inject 60 Units into the skin 1 day or 1 dose for 1 dose. 4 pen 0  . Insulin Pen Needle 32G X 4 MM MISC 1 EACH BY DOES NOT APPLY ROUTE 4 (FOUR) TIMES DAILY. 150 each 5  . lisinopril (ZESTRIL) 10 MG tablet Take 1 tablet (10 mg total) by mouth daily. 30 tablet 3  . metFORMIN (GLUCOPHAGE) 500 MG tablet Take 500 mg by mouth 2 (two) times daily with a meal.    . Multiple Vitamins-Minerals (HAIR SKIN AND NAILS FORMULA) TABS Take 1 tablet by mouth 2 (two) times daily.    . rosuvastatin (CRESTOR) 5 MG tablet Take 5 mg by mouth daily. Takes one on Monday, Wed., and Friday    . sertraline (ZOLOFT) 100 MG tablet Take 1.5 daily 45 tablet 2   No current facility-administered medications for this visit.    REVIEW OF SYSTEMS:   Constitutional: Denies fevers, chills or abnormal night sweats Respiratory: Denies cough, dyspnea or wheezes Cardiovascular: Denies palpitation, chest discomfort or lower extremity swelling Gastrointestinal:  Denies nausea, heartburn or change in bowel habits Skin: Denies abnormal skin rashes Lymphatics: Denies new lymphadenopathy or easy bruising Neurological:Denies numbness, tingling or new weaknesses Behavioral/Psych: Mood is stable, no new changes  All other systems were reviewed with the patient and are negative.  PHYSICAL EXAMINATION: ECOG PERFORMANCE STATUS: 0 - Asymptomatic  Vitals:   03/01/20 1318  BP: (!) 152/59  Pulse: 60  Resp: 18  Temp: (!) 97.5 F (36.4 C)  SpO2: 100%   Filed Weights   03/01/20 1318  Weight: 147 lb (66.7 kg)    GENERAL:alert, no distress and comfortable SKIN: skin color, texture, turgor are normal, no rashes or significant lesions NECK: supple, thyroid normal size, non-tender, without nodularity LYMPH:  no palpable lymphadenopathy in the cervical, axillary or inguinal LUNGS: clear to auscultation and percussion with normal breathing effort HEART:  regular rate & rhythm and no murmurs and no lower extremity edema ABDOMEN:abdomen soft, non-tender and normal bowel sounds Musculoskeletal:no cyanosis of digits and no clubbing  PSYCH: alert & oriented x 3 with fluent speech NEURO: no focal motor/sensory deficits  LABORATORY DATA:  I have reviewed the data as listed Recent Results (from the past 2160 hour(s))  CBC w/Diff/Platelet     Status: Abnormal   Collection Time: 02/09/20  7:43 AM  Result Value Ref Range   WBC 3.9 3.8 - 10.8 Thousand/uL   RBC 4.13 3.80 - 5.10 Million/uL   Hemoglobin 11.9 11.7 - 15.5 g/dL   HCT 35.3 35.0 - 45.0 %   MCV 85.5 80.0 - 100.0 fL   MCH 28.8 27.0 - 33.0 pg   MCHC 33.7 32.0 - 36.0 g/dL   RDW 15.7 (H) 11.0 - 15.0 %   Platelets 69 (L) 140 - 400 Thousand/uL   MPV 11.4 7.5 - 12.5 fL   Neutro Abs 2,168 1,500 - 7,800 cells/uL   Lymphs Abs 1,115 850 - 3,900 cells/uL   Absolute Monocytes 386 200 - 950 cells/uL   Eosinophils Absolute 199 15 - 500 cells/uL   Basophils Absolute 31 0 - 200 cells/uL   Neutrophils Relative % 55.6 %   Total Lymphocyte 28.6 %   Monocytes Relative 9.9 %   Eosinophils Relative 5.1 %   Basophils Relative 0.8 %  COMPLETE METABOLIC PANEL WITH GFR     Status: None   Collection Time: 02/09/20  7:43 AM  Result Value Ref Range   Glucose, Bld 93 65 - 99 mg/dL  Comment: .            Fasting reference interval .    BUN 10 7 - 25 mg/dL   Creat 0.78 0.50 - 0.99 mg/dL    Comment: For patients >3 years of age, the reference limit for Creatinine is approximately 13% higher for people identified as African-American. .    GFR, Est Non African American 79 > OR = 60 mL/min/1.4m2   GFR, Est African American 92 > OR = 60 mL/min/1.67m2   BUN/Creatinine Ratio NOT APPLICABLE 6 - 22 (calc)   Sodium 142 135 - 146 mmol/L   Potassium 3.8 3.5 - 5.3 mmol/L   Chloride 105 98 - 110 mmol/L   CO2 26 20 - 32 mmol/L   Calcium 9.3 8.6 - 10.4 mg/dL   Total Protein 7.0 6.1 - 8.1 g/dL   Albumin 3.9  3.6 - 5.1 g/dL   Globulin 3.1 1.9 - 3.7 g/dL (calc)   AG Ratio 1.3 1.0 - 2.5 (calc)   Total Bilirubin 0.8 0.2 - 1.2 mg/dL   Alkaline phosphatase (APISO) 44 37 - 153 U/L   AST 32 10 - 35 U/L   ALT 21 6 - 29 U/L  Fe+TIBC+Fer     Status: None   Collection Time: 02/09/20  7:43 AM  Result Value Ref Range   Iron 58 45 - 160 mcg/dL   TIBC 362 250 - 450 mcg/dL (calc)   %SAT 16 16 - 45 % (calc)   Ferritin 43 16 - 288 ng/mL  TSH     Status: None   Collection Time: 02/09/20  7:43 AM  Result Value Ref Range   TSH 2.13 0.40 - 4.50 mIU/L  Hemoglobin A1c     Status: Abnormal   Collection Time: 02/09/20  7:43 AM  Result Value Ref Range   Hgb A1c MFr Bld 8.7 (H) <5.7 % of total Hgb    Comment: For someone without known diabetes, a hemoglobin A1c value of 6.5% or greater indicates that they may have  diabetes and this should be confirmed with a follow-up  test. . For someone with known diabetes, a value <7% indicates  that their diabetes is well controlled and a value  greater than or equal to 7% indicates suboptimal  control. A1c targets should be individualized based on  duration of diabetes, age, comorbid conditions, and  other considerations. . Currently, no consensus exists regarding use of hemoglobin A1c for diagnosis of diabetes for children. .    Mean Plasma Glucose 203 (calc)   eAG (mmol/L) 11.2 (calc)  CBC with Differential/Platelet     Status: Abnormal   Collection Time: 03/01/20  2:48 PM  Result Value Ref Range   WBC 4.3 4.0 - 10.5 K/uL   RBC 4.37 3.87 - 5.11 MIL/uL   Hemoglobin 12.4 12.0 - 15.0 g/dL   HCT 38.7 36.0 - 46.0 %   MCV 88.6 80.0 - 100.0 fL   MCH 28.4 26.0 - 34.0 pg   MCHC 32.0 30.0 - 36.0 g/dL   RDW 15.1 11.5 - 15.5 %   Platelets 63 (L) 150 - 400 K/uL    Comment: Immature Platelet Fraction may be clinically indicated, consider ordering this additional test [id number]    nRBC 0.0 0.0 - 0.2 %   Neutrophils Relative % 69 %   Neutro Abs 3.0 1.7 - 7.7 K/uL    Lymphocytes Relative 22 %   Lymphs Abs 0.9 0.7 - 4.0 K/uL   Monocytes Relative 8 %  Monocytes Absolute 0.3 0.1 - 1.0 K/uL   Eosinophils Relative 0 %   Eosinophils Absolute 0.0 0.0 - 0.5 K/uL   Basophils Relative 1 %   Basophils Absolute 0.0 0.0 - 0.1 K/uL   Immature Granulocytes 0 %   Abs Immature Granulocytes 0.01 0.00 - 0.07 K/uL    Comment: Performed at [facility], [address], [place], [place] [address]  Lactate dehydrogenase     Status: Abnormal   Collection Time: 03/01/20  2:48 PM  Result Value Ref Range   LDH 203 (H) 98 - 192 U/L    Comment: Performed at [facility], [address] [place], [place] [address]    RADIOGRAPHIC STUDIES: I have personally reviewed the radiological images as listed and agreed with the findings in the report. I have independently interviewed the patient and examined her.  I agree with the HPI written by my nurse practitioner Wenda Low, FNP.  I have independently formulated the assessment and plan.  ASSESSMENT & PLAN:  THROMBOCYTOPENIA 1.  Moderate thrombocytopenia: -She has mild to moderate thrombocytopenia since 2015. -Ultrasound from 2008 reviewed by me shows fatty infiltration of the liver. -Recent CBC showed platelet count 69.  White count was 3.9.  ANC was normal.  Hemoglobin is 11.9. -She was reportedly anemic and 2019 and has been on iron pills. -Last colonoscopy was in March 2020 showed diminutive polyp in the transverse colon, otherwise normal exam. -Pathology consistent with sessile serrated polyp. -Differential diagnosis includes immune thrombocytopenia versus splenomegaly versus bone marrow infiltrative process. -We will repeat her platelet count today.  We will check for nutritional deficiencies and infectious etiologies.  We will also check for connective tissue disorders. -We will obtain imaging of the abdomen to follow-up on the fatty liver and possible splenomegaly. -We will see her back in 2 to 3 weeks for  follow-up.  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 -We will repeat SPEP, check immunofixation and free light chains. -We will also check LDH and beta-2 microglobulin.  We will check skeletal survey.   All questions were answered. The patient knows to call the clinic with any problems, questions or concerns.      Derek Jack, MD 03/01/20 4:36 PM

## 2020-03-01 NOTE — Assessment & Plan Note (Addendum)
1.  Moderate thrombocytopenia: -She has mild to moderate thrombocytopenia since 2015. -Ultrasound from 2008 reviewed by me shows fatty infiltration of the liver. -Recent CBC showed platelet count 69.  White count was 3.9.  ANC was normal.  Hemoglobin is 11.9. -She was reportedly anemic and 2019 and has been on iron pills. -Last colonoscopy was in March 2020 showed diminutive polyp in the transverse colon, otherwise normal exam. -Pathology consistent with sessile serrated polyp. -Differential diagnosis includes immune thrombocytopenia versus splenomegaly versus bone marrow infiltrative process. -We will repeat her platelet count today.  We will check for nutritional deficiencies and infectious etiologies.  We will also check for connective tissue disorders. -We will obtain imaging of the abdomen to follow-up on the fatty liver and possible splenomegaly. -We will see her back in 2 to 3 weeks for follow-up.  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 -We will repeat SPEP, check immunofixation and free light chains. -We will also check LDH and beta-2 microglobulin.  We will check skeletal survey.

## 2020-03-02 LAB — KAPPA/LAMBDA LIGHT CHAINS
Kappa free light chain: 54.4 mg/L — ABNORMAL HIGH (ref 3.3–19.4)
Kappa, lambda light chain ratio: 0.9 (ref 0.26–1.65)
Lambda free light chains: 60.5 mg/L — ABNORMAL HIGH (ref 5.7–26.3)

## 2020-03-02 LAB — BETA 2 MICROGLOBULIN, SERUM: Beta-2 Microglobulin: 2.5 mg/L — ABNORMAL HIGH (ref 0.6–2.4)

## 2020-03-02 LAB — RHEUMATOID FACTOR: Rheumatoid fact SerPl-aCnc: <10 IU/mL (ref 0.0–13.9)

## 2020-03-02 LAB — ANTINUCLEAR ANTIBODIES, IFA: ANA Ab, IFA: NEGATIVE

## 2020-03-02 LAB — HAPTOGLOBIN: Haptoglobin: 36 mg/dL — ABNORMAL LOW (ref 37–355)

## 2020-03-03 LAB — PROTEIN ELECTROPHORESIS, SERUM
A/G Ratio: 1.1 (ref 0.7–1.7)
Albumin ELP: 3.7 g/dL (ref 2.9–4.4)
Alpha-1-Globulin: 0.2 g/dL (ref 0.0–0.4)
Alpha-2-Globulin: 0.7 g/dL (ref 0.4–1.0)
Beta Globulin: 1.3 g/dL (ref 0.7–1.3)
Gamma Globulin: 1.2 g/dL (ref 0.4–1.8)
Globulin, Total: 3.4 g/dL (ref 2.2–3.9)
Total Protein ELP: 7.1 g/dL (ref 6.0–8.5)

## 2020-03-03 LAB — IMMUNOFIXATION ELECTROPHORESIS
IgA: 512 mg/dL — ABNORMAL HIGH (ref 87–352)
IgG (Immunoglobin G), Serum: 1212 mg/dL (ref 586–1602)
IgM (Immunoglobulin M), Srm: 129 mg/dL (ref 26–217)
Total Protein ELP: 7.3 g/dL (ref 6.0–8.5)

## 2020-03-03 LAB — COPPER, SERUM: Copper: 128 ug/dL (ref 80–158)

## 2020-03-06 LAB — METHYLMALONIC ACID, SERUM: Methylmalonic Acid, Quantitative: 248 nmol/L (ref 0–378)

## 2020-03-11 ENCOUNTER — Other Ambulatory Visit: Payer: Self-pay | Admitting: Family Medicine

## 2020-03-15 ENCOUNTER — Other Ambulatory Visit (HOSPITAL_COMMUNITY): Payer: Self-pay | Admitting: *Deleted

## 2020-03-15 DIAGNOSIS — D696 Thrombocytopenia, unspecified: Secondary | ICD-10-CM

## 2020-03-17 DIAGNOSIS — F331 Major depressive disorder, recurrent, moderate: Secondary | ICD-10-CM | POA: Diagnosis not present

## 2020-03-17 DIAGNOSIS — D509 Iron deficiency anemia, unspecified: Secondary | ICD-10-CM | POA: Diagnosis not present

## 2020-03-17 DIAGNOSIS — Z0189 Encounter for other specified special examinations: Secondary | ICD-10-CM | POA: Diagnosis not present

## 2020-03-17 DIAGNOSIS — G4700 Insomnia, unspecified: Secondary | ICD-10-CM | POA: Diagnosis not present

## 2020-03-17 DIAGNOSIS — E1165 Type 2 diabetes mellitus with hyperglycemia: Secondary | ICD-10-CM | POA: Diagnosis not present

## 2020-03-17 DIAGNOSIS — E785 Hyperlipidemia, unspecified: Secondary | ICD-10-CM | POA: Diagnosis not present

## 2020-03-17 DIAGNOSIS — I10 Essential (primary) hypertension: Secondary | ICD-10-CM | POA: Diagnosis not present

## 2020-03-17 DIAGNOSIS — R002 Palpitations: Secondary | ICD-10-CM | POA: Diagnosis not present

## 2020-03-18 ENCOUNTER — Ambulatory Visit (HOSPITAL_COMMUNITY): Payer: Medicare HMO

## 2020-03-23 ENCOUNTER — Other Ambulatory Visit: Payer: Self-pay

## 2020-03-23 ENCOUNTER — Inpatient Hospital Stay (HOSPITAL_COMMUNITY): Payer: Medicare HMO | Attending: Hematology | Admitting: Hematology

## 2020-03-23 DIAGNOSIS — D696 Thrombocytopenia, unspecified: Secondary | ICD-10-CM | POA: Diagnosis not present

## 2020-03-23 NOTE — Progress Notes (Signed)
Virtual Visit via Telephone Note  I connected with [name] [name] on 03/23/20 at  2:50 PM EDT by telephone and verified that I am speaking with the correct person using two identifiers.   I discussed the limitations, risks, security and privacy concerns of performing an evaluation and management service by telephone and the availability of in person appointments. I also discussed with the patient that there may be a patient responsible charge related to this service. The patient expressed understanding and agreed to proceed.   History of Present Illness: She was evaluated in our clinic for mild to moderate thrombocytopenia since 2015.  Ultrasound from 2008 showed fatty infiltration of the liver.  Most recent CBC showed platelet count 69 with white count of 3.9.  ANC was normal.  Hemoglobin is 11.9.  She was also found to have an M spike of 0.5 g in 2008.   Observations/Objective: She denies any easy bruising or bleeding.  No new onset pains.  No nosebleeds, hematuria, bleeding per rectum or melena.  Denies any fevers, night sweats or weight loss.  Assessment and Plan:  1.  Moderate thrombocytopenia: -CBC on 03/01/2020 shows platelet count 63 hemoglobin was normal. -[id number], folic acid, methylmalonic acid and copper levels were normal. -LDH is elevated at 203.  Hepatitis panel was negative.  ANA and rheumatoid factor was negative. -I have recommended imaging of the abdomen with CT.  This is to evaluate for any splenomegaly.  She had history of fatty liver in the past. -Differential diagnosis includes splenomegaly causing thrombocytopenia versus immune mediated thrombocytopenia versus early MDS. -We will see her after the CT scan to discuss results.  2.  History of monoclonal gammopathy: -She had positive M spike of 0.5 g in 2008. -We have repeated her SPEP which was negative for M spike.  Immunofixation was polyclonal.  Free kappa light chains and lambda light chains were elevated with normal  ratio. -She does not have any evidence of monoclonal gammopathy at this time.  3.  Iron deficiency: -Ferritin is 31.  TIBC is high at 42. -She is taking iron tablet daily.  Hemoglobin was normal.   Follow Up Instructions: RTC after CT scan.   I discussed the assessment and treatment plan with the patient. The patient was provided an opportunity to ask questions and all were answered. The patient agreed with the plan and demonstrated an understanding of the instructions.   The patient was advised to call back or seek an in-person evaluation if the symptoms worsen or if the condition fails to improve as anticipated.  I provided 12 minutes of non-face-to-face time during this encounter.   Derek Jack, MD

## 2020-04-04 ENCOUNTER — Ambulatory Visit (HOSPITAL_COMMUNITY): Admission: RE | Admit: 2020-04-04 | Payer: Medicare HMO | Source: Ambulatory Visit

## 2020-04-08 ENCOUNTER — Other Ambulatory Visit: Payer: Self-pay | Admitting: Family Medicine

## 2020-04-18 ENCOUNTER — Ambulatory Visit (HOSPITAL_COMMUNITY): Payer: Medicare HMO | Admitting: Hematology

## 2020-04-20 ENCOUNTER — Ambulatory Visit: Payer: Self-pay | Admitting: "Endocrinology

## 2020-06-09 DIAGNOSIS — E1169 Type 2 diabetes mellitus with other specified complication: Secondary | ICD-10-CM | POA: Diagnosis not present

## 2020-06-09 DIAGNOSIS — E785 Hyperlipidemia, unspecified: Secondary | ICD-10-CM | POA: Diagnosis not present

## 2020-06-09 DIAGNOSIS — I10 Essential (primary) hypertension: Secondary | ICD-10-CM | POA: Diagnosis not present

## 2020-06-09 DIAGNOSIS — Z0000 Encounter for general adult medical examination without abnormal findings: Secondary | ICD-10-CM | POA: Diagnosis not present

## 2020-06-14 DIAGNOSIS — R269 Unspecified abnormalities of gait and mobility: Secondary | ICD-10-CM | POA: Diagnosis not present

## 2020-06-14 DIAGNOSIS — I10 Essential (primary) hypertension: Secondary | ICD-10-CM | POA: Diagnosis not present

## 2020-06-14 DIAGNOSIS — R002 Palpitations: Secondary | ICD-10-CM | POA: Diagnosis not present

## 2020-06-14 DIAGNOSIS — E785 Hyperlipidemia, unspecified: Secondary | ICD-10-CM | POA: Diagnosis not present

## 2020-06-14 DIAGNOSIS — G4700 Insomnia, unspecified: Secondary | ICD-10-CM | POA: Diagnosis not present

## 2020-06-14 DIAGNOSIS — F331 Major depressive disorder, recurrent, moderate: Secondary | ICD-10-CM | POA: Diagnosis not present

## 2020-06-14 DIAGNOSIS — E870 Hyperosmolality and hypernatremia: Secondary | ICD-10-CM | POA: Diagnosis not present

## 2020-06-14 DIAGNOSIS — E1165 Type 2 diabetes mellitus with hyperglycemia: Secondary | ICD-10-CM | POA: Diagnosis not present

## 2020-06-14 DIAGNOSIS — D509 Iron deficiency anemia, unspecified: Secondary | ICD-10-CM | POA: Diagnosis not present

## 2020-06-16 DIAGNOSIS — R269 Unspecified abnormalities of gait and mobility: Secondary | ICD-10-CM | POA: Diagnosis not present

## 2020-06-16 DIAGNOSIS — M6281 Muscle weakness (generalized): Secondary | ICD-10-CM | POA: Diagnosis not present

## 2020-06-23 DIAGNOSIS — M6281 Muscle weakness (generalized): Secondary | ICD-10-CM | POA: Diagnosis not present

## 2020-06-23 DIAGNOSIS — R269 Unspecified abnormalities of gait and mobility: Secondary | ICD-10-CM | POA: Diagnosis not present

## 2020-10-12 DIAGNOSIS — I10 Essential (primary) hypertension: Secondary | ICD-10-CM | POA: Diagnosis not present

## 2020-10-12 DIAGNOSIS — R7303 Prediabetes: Secondary | ICD-10-CM | POA: Diagnosis not present

## 2020-10-12 DIAGNOSIS — E039 Hypothyroidism, unspecified: Secondary | ICD-10-CM | POA: Diagnosis not present

## 2020-10-12 DIAGNOSIS — D508 Other iron deficiency anemias: Secondary | ICD-10-CM | POA: Diagnosis not present

## 2020-10-19 DIAGNOSIS — I10 Essential (primary) hypertension: Secondary | ICD-10-CM | POA: Diagnosis not present

## 2020-10-19 DIAGNOSIS — D509 Iron deficiency anemia, unspecified: Secondary | ICD-10-CM | POA: Diagnosis not present

## 2020-10-19 DIAGNOSIS — R002 Palpitations: Secondary | ICD-10-CM | POA: Diagnosis not present

## 2020-10-19 DIAGNOSIS — E785 Hyperlipidemia, unspecified: Secondary | ICD-10-CM | POA: Diagnosis not present

## 2020-10-19 DIAGNOSIS — Z23 Encounter for immunization: Secondary | ICD-10-CM | POA: Diagnosis not present

## 2020-10-19 DIAGNOSIS — Z0001 Encounter for general adult medical examination with abnormal findings: Secondary | ICD-10-CM | POA: Diagnosis not present

## 2020-10-19 DIAGNOSIS — E1165 Type 2 diabetes mellitus with hyperglycemia: Secondary | ICD-10-CM | POA: Diagnosis not present

## 2020-10-19 DIAGNOSIS — G4700 Insomnia, unspecified: Secondary | ICD-10-CM | POA: Diagnosis not present

## 2020-10-19 DIAGNOSIS — F331 Major depressive disorder, recurrent, moderate: Secondary | ICD-10-CM | POA: Diagnosis not present

## 2020-10-21 ENCOUNTER — Other Ambulatory Visit (HOSPITAL_COMMUNITY): Payer: Self-pay | Admitting: Internal Medicine

## 2020-10-21 DIAGNOSIS — D696 Thrombocytopenia, unspecified: Secondary | ICD-10-CM

## 2020-11-16 ENCOUNTER — Other Ambulatory Visit: Payer: Self-pay

## 2020-11-16 ENCOUNTER — Other Ambulatory Visit (HOSPITAL_COMMUNITY): Payer: Self-pay | Admitting: Internal Medicine

## 2020-11-16 ENCOUNTER — Ambulatory Visit (HOSPITAL_COMMUNITY)
Admission: RE | Admit: 2020-11-16 | Discharge: 2020-11-16 | Disposition: A | Discharge status: Home / Self Care | Payer: Medicare HMO | Source: Ambulatory Visit | Attending: Internal Medicine | Admitting: Internal Medicine

## 2020-11-16 DIAGNOSIS — K8020 Calculus of gallbladder without cholecystitis without obstruction: Secondary | ICD-10-CM | POA: Diagnosis not present

## 2020-11-16 DIAGNOSIS — D696 Thrombocytopenia, unspecified: Secondary | ICD-10-CM

## 2020-11-16 DIAGNOSIS — E278 Other specified disorders of adrenal gland: Secondary | ICD-10-CM | POA: Diagnosis not present

## 2020-11-16 DIAGNOSIS — K766 Portal hypertension: Secondary | ICD-10-CM | POA: Diagnosis not present

## 2020-11-16 DIAGNOSIS — K7469 Other cirrhosis of liver: Secondary | ICD-10-CM | POA: Diagnosis not present

## 2020-11-16 MED ORDER — IOHEXOL 300 MG/ML  SOLN: 100.0000 mL | Freq: Once | INTRAMUSCULAR | Status: DC | PRN

## 2020-11-21 DIAGNOSIS — D696 Thrombocytopenia, unspecified: Secondary | ICD-10-CM | POA: Diagnosis not present

## 2020-11-21 DIAGNOSIS — E1165 Type 2 diabetes mellitus with hyperglycemia: Secondary | ICD-10-CM | POA: Diagnosis not present

## 2020-11-21 DIAGNOSIS — F331 Major depressive disorder, recurrent, moderate: Secondary | ICD-10-CM | POA: Diagnosis not present

## 2020-11-21 DIAGNOSIS — R161 Splenomegaly, not elsewhere classified: Secondary | ICD-10-CM | POA: Diagnosis not present

## 2020-11-21 DIAGNOSIS — K7460 Unspecified cirrhosis of liver: Secondary | ICD-10-CM | POA: Diagnosis not present

## 2020-11-26 ENCOUNTER — Other Ambulatory Visit: Payer: Self-pay | Admitting: Family Medicine

## 2021-01-04 ENCOUNTER — Inpatient Hospital Stay (HOSPITAL_COMMUNITY): Payer: Medicare HMO | Attending: Hematology | Admitting: Hematology

## 2021-02-09 ENCOUNTER — Other Ambulatory Visit (INDEPENDENT_AMBULATORY_CARE_PROVIDER_SITE_OTHER): Payer: Medicare HMO

## 2021-02-09 ENCOUNTER — Encounter: Payer: Self-pay | Admitting: Internal Medicine

## 2021-02-09 ENCOUNTER — Ambulatory Visit: Payer: Medicare HMO | Admitting: Internal Medicine

## 2021-02-09 ENCOUNTER — Other Ambulatory Visit: Payer: Self-pay

## 2021-02-09 VITALS — BP 120/70 | HR 63 | Ht 63.0 in | Wt 145.8 lb

## 2021-02-09 DIAGNOSIS — R159 Full incontinence of feces: Secondary | ICD-10-CM

## 2021-02-09 DIAGNOSIS — K760 Fatty (change of) liver, not elsewhere classified: Secondary | ICD-10-CM | POA: Diagnosis not present

## 2021-02-09 DIAGNOSIS — I358 Other nonrheumatic aortic valve disorders: Secondary | ICD-10-CM

## 2021-02-09 DIAGNOSIS — K766 Portal hypertension: Secondary | ICD-10-CM | POA: Diagnosis not present

## 2021-02-09 DIAGNOSIS — R188 Other ascites: Secondary | ICD-10-CM

## 2021-02-09 DIAGNOSIS — L89891 Pressure ulcer of other site, stage 1: Secondary | ICD-10-CM | POA: Diagnosis not present

## 2021-02-09 DIAGNOSIS — K7460 Unspecified cirrhosis of liver: Secondary | ICD-10-CM

## 2021-02-09 DIAGNOSIS — K6289 Other specified diseases of anus and rectum: Secondary | ICD-10-CM | POA: Insufficient documentation

## 2021-02-09 DIAGNOSIS — K529 Noninfective gastroenteritis and colitis, unspecified: Secondary | ICD-10-CM | POA: Diagnosis not present

## 2021-02-09 DIAGNOSIS — I350 Nonrheumatic aortic (valve) stenosis: Secondary | ICD-10-CM | POA: Insufficient documentation

## 2021-02-09 DIAGNOSIS — R152 Fecal urgency: Secondary | ICD-10-CM

## 2021-02-09 DIAGNOSIS — D472 Monoclonal gammopathy: Secondary | ICD-10-CM

## 2021-02-09 LAB — PROTIME-INR
INR: 1.1 ratio — ABNORMAL HIGH (ref 0.8–1.0)
Prothrombin Time: 12.7 s (ref 9.6–13.1)

## 2021-02-09 LAB — AMMONIA: Ammonia: 60 umol/L — ABNORMAL HIGH (ref 11–35)
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
## 2021-02-09 NOTE — Patient Instructions (Addendum)
You have been scheduled for an endoscopy. Please follow written instructions given to you at your visit today. If you use inhalers (even only as needed), please bring them with you on the day of your procedure.  Your provider has requested that you go to the basement level for lab work before leaving today. Press "B" on the elevator. The lab is located at the first door on the left as you exit the elevator.  Due to recent changes in healthcare laws, you may see the results of your imaging and laboratory studies on MyChart before your provider has had a chance to review them.  We understand that in some cases there may be results that are confusing or concerning to you. Not all laboratory results come back in the same time frame and the provider may be waiting for multiple results in order to interpret others.  Please give [place] 48 hours in order for your provider to thoroughly review all the results before contacting the office for clarification of your results.   Please take 2 over the counter Imodium every morning.  Put Destin on you sore on your buttock area.  We will get your labs from Dr Nevada Crane for review.  We are giving you a handout to read on Cirrhosis.  I appreciate the opportunity to care for you. Silvano Rusk, MD, [facility]

## 2021-02-10 ENCOUNTER — Other Ambulatory Visit: Payer: Self-pay | Admitting: Internal Medicine

## 2021-02-10 ENCOUNTER — Telehealth: Payer: Self-pay

## 2021-02-10 MED ORDER — RIFAXIMIN 550 MG PO TABS: 550.0000 mg | ORAL_TABLET | Freq: Two times a day (BID) | ORAL | 11 refills | Status: DC

## 2021-02-10 NOTE — Progress Notes (Signed)
Left her a voicemail to call me back. No other # to call, her husband Louie Casa has the same #. They don't have a cell#.

## 2021-02-10 NOTE — Telephone Encounter (Signed)
I spoke with [name] [name]'s husband [name] [name] and told him the lab results and that we are working on getting the [place] approved for [name] [name] thru the help of [name] [name] . The xifaxan rx has been resent to [facility].

## 2021-02-12 LAB — HEPATITIS A ANTIBODY, TOTAL: Hepatitis A AB,Total: REACTIVE — AB

## 2021-02-12 LAB — ANTI-SMOOTH MUSCLE ANTIBODY, IGG: Actin (Smooth Muscle) Antibody (IGG): <20 U (ref <20)

## 2021-02-12 LAB — AFP TUMOR MARKER: AFP-Tumor Marker: 4.8 ng/mL

## 2021-02-12 LAB — ALPHA-1-ANTITRYPSIN: A-1 Antitrypsin, Ser: 131 mg/dL (ref 83–199)

## 2021-02-12 LAB — HEPATITIS C ANTIBODY
Hepatitis C Ab: NONREACTIVE
SIGNAL TO CUT-OFF: 0.01 (ref <1.00)

## 2021-02-12 LAB — MITOCHONDRIAL ANTIBODIES: Mitochondrial M2 Ab, IgG: <=20 U

## 2021-02-13 ENCOUNTER — Telehealth: Payer: Self-pay | Admitting: Internal Medicine

## 2021-02-13 NOTE — Telephone Encounter (Signed)
[name] [name] a message that like we talked about on Friday the xifaxan has been resent to [facility]. Hopefully they can get a better price.

## 2021-02-15 NOTE — Telephone Encounter (Signed)
I called to check on the xifaxan rx and the rep was unable to get the department I needed on the line. I will try again tomorrow.

## 2021-02-16 NOTE — Telephone Encounter (Signed)
I spoke with [name] [name]'s husband Louie Casa to let him know that [facility] is still working on her xifaxan rx. Louie Casa reports that her diarrhea is better with taking the 2 Imodium every AM. Louie Casa said to let Dr Carlean Purl know that she is sleepy in the daytime because she sits up all night on the sofa with the TV on and snacks on and off not really getting good sleep. He said she is eating things not good for her diabetes, candy and chips. Her EGD appointment is coming up in early April.

## 2021-02-16 NOTE — Telephone Encounter (Signed)
I spoke to [facility] and they are still working on her xifaxan rx. I called and spoke to her husband Louie Casa to update him. He said her diarrhea is better with her taking the 2 Imodium every AM. I have routed a note to Dr Carlean Purl to let him know about this and about her sleep habits of sitting up at night watching TV.

## 2021-02-22 NOTE — Telephone Encounter (Signed)
I spoke with [facility] and they said her insurance approved the xifaxan but it came back with a co-pay of $1045.16. They are going to reach out to them today and see if she qualifies for patient assistance.

## 2021-02-28 NOTE — Telephone Encounter (Signed)
I spoke with customer service rep Starla Link and she said they are still working on this and I gave them the husband's name to talk to. She said it looks like they can get it for free.

## 2021-03-08 DIAGNOSIS — R269 Unspecified abnormalities of gait and mobility: Secondary | ICD-10-CM | POA: Diagnosis not present

## 2021-03-08 DIAGNOSIS — D509 Iron deficiency anemia, unspecified: Secondary | ICD-10-CM | POA: Diagnosis not present

## 2021-03-08 DIAGNOSIS — I10 Essential (primary) hypertension: Secondary | ICD-10-CM | POA: Diagnosis not present

## 2021-03-08 DIAGNOSIS — K7460 Unspecified cirrhosis of liver: Secondary | ICD-10-CM | POA: Diagnosis not present

## 2021-03-08 DIAGNOSIS — E870 Hyperosmolality and hypernatremia: Secondary | ICD-10-CM | POA: Diagnosis not present

## 2021-03-08 DIAGNOSIS — E785 Hyperlipidemia, unspecified: Secondary | ICD-10-CM | POA: Diagnosis not present

## 2021-03-08 DIAGNOSIS — G4710 Hypersomnia, unspecified: Secondary | ICD-10-CM | POA: Diagnosis not present

## 2021-03-08 DIAGNOSIS — R233 Spontaneous ecchymoses: Secondary | ICD-10-CM | POA: Diagnosis not present

## 2021-03-08 DIAGNOSIS — E1165 Type 2 diabetes mellitus with hyperglycemia: Secondary | ICD-10-CM | POA: Diagnosis not present

## 2021-03-13 ENCOUNTER — Other Ambulatory Visit (HOSPITAL_COMMUNITY): Payer: Self-pay | Admitting: Internal Medicine

## 2021-03-13 DIAGNOSIS — G4700 Insomnia, unspecified: Secondary | ICD-10-CM | POA: Diagnosis not present

## 2021-03-13 DIAGNOSIS — R002 Palpitations: Secondary | ICD-10-CM | POA: Diagnosis not present

## 2021-03-13 DIAGNOSIS — I362 Nonrheumatic tricuspid (valve) stenosis with insufficiency: Secondary | ICD-10-CM | POA: Diagnosis not present

## 2021-03-13 DIAGNOSIS — E870 Hyperosmolality and hypernatremia: Secondary | ICD-10-CM | POA: Diagnosis not present

## 2021-03-13 DIAGNOSIS — F331 Major depressive disorder, recurrent, moderate: Secondary | ICD-10-CM | POA: Diagnosis not present

## 2021-03-13 DIAGNOSIS — Z0001 Encounter for general adult medical examination with abnormal findings: Secondary | ICD-10-CM | POA: Diagnosis not present

## 2021-03-13 DIAGNOSIS — E1165 Type 2 diabetes mellitus with hyperglycemia: Secondary | ICD-10-CM | POA: Diagnosis not present

## 2021-03-13 DIAGNOSIS — Z13820 Encounter for screening for osteoporosis: Secondary | ICD-10-CM

## 2021-03-13 DIAGNOSIS — E785 Hyperlipidemia, unspecified: Secondary | ICD-10-CM | POA: Diagnosis not present

## 2021-03-13 DIAGNOSIS — G4710 Hypersomnia, unspecified: Secondary | ICD-10-CM | POA: Diagnosis not present

## 2021-03-13 DIAGNOSIS — Z1231 Encounter for screening mammogram for malignant neoplasm of breast: Secondary | ICD-10-CM

## 2021-03-13 DIAGNOSIS — I10 Essential (primary) hypertension: Secondary | ICD-10-CM | POA: Diagnosis not present

## 2021-03-13 DIAGNOSIS — D509 Iron deficiency anemia, unspecified: Secondary | ICD-10-CM | POA: Diagnosis not present

## 2021-03-13 DIAGNOSIS — R197 Diarrhea, unspecified: Secondary | ICD-10-CM | POA: Diagnosis not present

## 2021-03-13 NOTE — Telephone Encounter (Signed)
I spoke with [name] [name] and he said her diarrhea is 100% gone now by taking the 2 Imodium every AM. [facility] customer service rep stated that they called and screened [name] [name] last Tuesday and that they are mailing her out paperwork to complete. I called and left them a message to complete the paperwork please.

## 2021-03-20 ENCOUNTER — Telehealth: Payer: Self-pay

## 2021-03-20 NOTE — Telephone Encounter (Signed)
Referral notes sent from [facility], Phone #: [phone], Fax #: [phone]   Notes sent to scheduling

## 2021-03-24 ENCOUNTER — Other Ambulatory Visit: Payer: Self-pay | Admitting: Family Medicine

## 2021-03-24 ENCOUNTER — Other Ambulatory Visit: Payer: Self-pay

## 2021-03-24 ENCOUNTER — Ambulatory Visit (AMBULATORY_SURGERY_CENTER): Payer: Medicare HMO | Admitting: Internal Medicine

## 2021-03-24 ENCOUNTER — Encounter: Payer: Self-pay | Admitting: Internal Medicine

## 2021-03-24 VITALS — BP 125/64 | HR 65 | Temp 97.7°F | Resp 18 | Ht 63.0 in | Wt 145.0 lb

## 2021-03-24 DIAGNOSIS — R188 Other ascites: Secondary | ICD-10-CM

## 2021-03-24 DIAGNOSIS — K766 Portal hypertension: Secondary | ICD-10-CM

## 2021-03-24 DIAGNOSIS — R011 Cardiac murmur, unspecified: Secondary | ICD-10-CM

## 2021-03-24 DIAGNOSIS — K7460 Unspecified cirrhosis of liver: Secondary | ICD-10-CM

## 2021-03-24 DIAGNOSIS — K2970 Gastritis, unspecified, without bleeding: Secondary | ICD-10-CM | POA: Diagnosis not present

## 2021-03-24 DIAGNOSIS — I8510 Secondary esophageal varices without bleeding: Secondary | ICD-10-CM

## 2021-03-24 DIAGNOSIS — K3189 Other diseases of stomach and duodenum: Secondary | ICD-10-CM | POA: Insufficient documentation

## 2021-03-24 DIAGNOSIS — Z8719 Personal history of other diseases of the digestive system: Secondary | ICD-10-CM | POA: Diagnosis not present

## 2021-03-24 MED ORDER — SODIUM CHLORIDE 0.9 % IV SOLN
500.0000 mL | Freq: Once | INTRAVENOUS | Status: DC
Start: 2021-03-24 — End: 2023-09-26

## 2021-03-24 NOTE — Patient Instructions (Addendum)
[name] [name],  You do have varices or swollen veins in the esophagus. These are like varicose veins and result from changes in blood flow related to cirrhosis. Yours are medium in size. The main risk from these is that they can burst and bleed. Yours are not so large that I think that will happen soon but it is advisable not to lift heavy objects or strain which can increase the pressure in these and cause them to burst. If they do you may vomit blood or pass red or very dark blood in your bowels.  You also have something called portal gastropathy which can leak blood - it oozed a bit today when scope touched it. This can but usually does not cause major bleeding.  I checked to see if the stomach infection I treated in 2018 is gone. I will let you know and plan follow-up.  Please read the handouts.  I also heard a heart murmur today. I want you to see a heart specialist to get heart checked. We will refer you.  I appreciate the opportunity to care for you. Gatha Mayer, MD, FACG YOU HAD AN ENDOSCOPIC PROCEDURE TODAY AT [facility]:   Refer to the procedure report that was given to you for any specific questions about what was found during the examination.  If the procedure report does not answer your questions, please call your gastroenterologist to clarify.  If you requested that your care partner not be given the details of your procedure findings, then the procedure report has been included in a sealed envelope for you to review at your convenience later.  YOU SHOULD EXPECT: Some feelings of bloating in the abdomen. Passage of more gas than usual.  Walking can help get rid of the air that was put into your GI tract during the procedure and reduce the bloating. If you had a lower endoscopy (such as a colonoscopy or flexible sigmoidoscopy) you may notice spotting of blood in your stool or on the toilet paper. If you underwent a bowel prep for your procedure, you may not have a normal  bowel movement for a few days.  Please Note:  You might notice some irritation and congestion in your nose or some drainage.  This is from the oxygen used during your procedure.  There is no need for concern and it should clear up in a day or so.  SYMPTOMS TO REPORT IMMEDIATELY:    Following upper endoscopy (EGD)  Vomiting of blood or coffee ground material  New chest pain or pain under the shoulder blades  Painful or persistently difficult swallowing  New shortness of breath  Fever of 100F or higher  Black, tarry-looking stools  For urgent or emergent issues, a gastroenterologist can be reached at any hour by calling [phone]. Do not use MyChart messaging for urgent concerns.    DIET:  We do recommend a small meal at first, but then you may proceed to your regular diet.  Drink plenty of fluids but you should avoid alcoholic beverages for 24 hours.  MEDICATIONS: Continue present medications.  Please see handouts given to you by your recovery nurse.  FOLLOW UP: Repeat upper endoscopy in 1 year for surveillance.  REFER TO CARDIOLOGY further evaluation and treatment - Dr. Celesta Aver office nurse will call you to schedule this appointment.  Thank you for allowing [place] to provide for your healthcare needs today.  ACTIVITY:  You should plan to take it easy for the rest of today  and you should NOT DRIVE or use heavy machinery until tomorrow (because of the sedation medicines used during the test).    FOLLOW UP: Our staff will call the number listed on your records 48-72 hours following your procedure to check on you and address any questions or concerns that you may have regarding the information given to you following your procedure. If we do not reach you, we will leave a message.  We will attempt to reach you two times.  During this call, we will ask if you have developed any symptoms of COVID 19. If you develop any symptoms (ie: fever, flu-like symptoms, shortness of breath, cough  etc.) before then, please call [phone].  If you test positive for Covid 19 in the 2 weeks post procedure, please call and report this information to [place].    If any biopsies were taken you will be contacted by phone or by letter within the next 1-3 weeks.  Please call us at [phone] if you have not heard about the biopsies in 3 weeks.    SIGNATURES/CONFIDENTIALITY: You and/or your care partner have signed paperwork which will be entered into your electronic medical record.  These signatures attest to the fact that that the information above on your After Visit Summary has been reviewed and is understood.  Full responsibility of the confidentiality of this discharge information lies with you and/or your care-partner.

## 2021-03-24 NOTE — Op Note (Signed)
[place] Patient Name: [name] [name] Procedure Date: 03/24/2021 10:18 AM MRN: [id number] Endoscopist: Gatha Mayer , MD Age: 70 Referring MD:  Date of Birth: [birth year] Gender: Female Account #: [id number] Procedure:                Upper GI endoscopy Indications:              Cirrhosis rule out esophageal varices Medicines:                Propofol per Anesthesia, Monitored Anesthesia Care Procedure:                Pre-Anesthesia Assessment:                           - Prior to the procedure, a History and Physical                            was performed, and patient medications and                            allergies were reviewed. The patient's tolerance of                            previous anesthesia was also reviewed. The risks                            and benefits of the procedure and the sedation                            options and risks were discussed with the patient.                            All questions were answered, and informed consent                            was obtained. Prior Anticoagulants: The patient has                            taken no previous anticoagulant or antiplatelet                            agents. ASA Grade Assessment: III - A patient with                            severe systemic disease. After reviewing the risks                            and benefits, the patient was deemed in                            satisfactory condition to undergo the procedure.                           After obtaining informed consent, the endoscope was  passed under direct vision. Throughout the                            procedure, the patient's blood pressure, pulse, and                            oxygen saturations were monitored continuously. The                            Endoscope was introduced through the mouth, and                            advanced to the second part of duodenum. The upper                             GI endoscopy was accomplished without difficulty.                            The patient tolerated the procedure well. Scope In: Scope Out: Findings:                 Three columns of grade II varices with no stigmata                            of recent bleeding were found in the lower third of                            the esophagus,. They were 5 mm in largest diameter.                            No red wale signs were present.                           Moderate portal hypertensive gastropathy was found                            in the cardia, in the gastric fundus and in the                            gastric body. Estimated blood loss was minimal.                           Striped mildly erythematous mucosa without bleeding                            was found in the gastric antrum. Biopsies were                            taken with a cold forceps for Helicobacter pylori                            testing using CLOtest. Verification of patient  identification for the specimen was done. Estimated                            blood loss was minimal.                           The exam was otherwise without abnormality.                           The cardia and gastric fundus were normal on                            retroflexion. Complications:            No immediate complications. Estimated Blood Loss:     Estimated blood loss was minimal. Impression:               - Grade II esophageal varices with no stigmata of                            recent bleeding.                           - Portal hypertensive gastropathy.                           - Erythematous mucosa in the antrum. Biopsied.                           - The examination was otherwise normal. Recommendation:           - Patient has a contact number available for                            emergencies. The signs and symptoms of potential                            delayed complications were  discussed with the                            patient. Return to normal activities tomorrow.                            Written discharge instructions were provided to the                            patient.                           - Resume previous diet.                           - Continue present medications.                           - Await pathology results.                           -  Repeat upper endoscopy in 1 year for surveillance.                           - REFER TO CARDIOLOGY - 2019 ECHO WITH ELEVATED                            PULMONARY ARTERY PRESSURE AND VALVULAR                            ABNORMALITIS, HAS 3/6 SYSTOLIC MURMUR, CIRRHOSIS,                            ASCITES Gatha Mayer, MD 03/24/2021 10:52:58 AM This report has been signed electronically.

## 2021-03-24 NOTE — Progress Notes (Signed)
CBG was 59 on admission, pt was asymptomatic. Rechecked 15 min later after starting D5W CBG was 109.

## 2021-03-24 NOTE — Progress Notes (Signed)
To PACU, VSS. Report to [email or web address] 

## 2021-03-24 NOTE — Progress Notes (Signed)
Called to room to assist during endoscopic procedure.  Patient ID and intended procedure confirmed with present staff. Received instructions for my participation in the procedure from the performing physician.  

## 2021-03-27 ENCOUNTER — Other Ambulatory Visit: Payer: Self-pay

## 2021-03-27 DIAGNOSIS — R011 Cardiac murmur, unspecified: Secondary | ICD-10-CM

## 2021-03-27 DIAGNOSIS — K7460 Unspecified cirrhosis of liver: Secondary | ICD-10-CM

## 2021-03-27 DIAGNOSIS — I358 Other nonrheumatic aortic valve disorders: Secondary | ICD-10-CM

## 2021-03-27 LAB — HELICOBACTER PYLORI SCREEN-BIOPSY: UREASE: NEGATIVE

## 2021-03-28 ENCOUNTER — Telehealth: Payer: Self-pay | Admitting: *Deleted

## 2021-03-28 NOTE — Telephone Encounter (Signed)
  Follow up Call-  Call back number 03/24/2021 03/04/2019  Post procedure Call Back phone  # [phone]  Permission to leave phone message Yes Yes  Some recent data might be hidden     Patient questions:  Do you have a fever, pain , or abdominal swelling? No. Pain Score  0 *  Have you tolerated food without any problems? Yes.    Have you been able to return to your normal activities? Yes.    Do you have any questions about your discharge instructions: Diet   No. Medications  No. Follow up visit  No.  Do you have questions or concerns about your Care? No.  Actions: * If pain score is 4 or above: No action needed, pain <4.  1. Have you developed a fever since your procedure? no  2.   Have you had an respiratory symptoms (SOB or cough) since your procedure? no  3.   Have you tested positive for COVID 19 since your procedure no  4.   Have you had any family members/close contacts diagnosed with the COVID 19 since your procedure?  no   If yes to any of these questions please route to Joylene John, RN and Joella Prince, RN

## 2021-04-04 NOTE — Progress Notes (Signed)
Informed patient of results. Patient/patients husband states they have not been able to get xifaxan and are unaware of any patient assistance started on their behalf. Patient is doing very well taking Imodium for her diarrhea. Informed patient we may be starting her on a medication called lactulose in place of the xifaxan. Informed patient that I will send this message back to Dr. Carlean Purl for advice.

## 2021-04-10 ENCOUNTER — Other Ambulatory Visit (HOSPITAL_COMMUNITY): Payer: Medicare HMO

## 2021-04-10 ENCOUNTER — Ambulatory Visit (HOSPITAL_COMMUNITY): Payer: Medicare HMO

## 2021-04-10 DIAGNOSIS — R002 Palpitations: Secondary | ICD-10-CM | POA: Diagnosis not present

## 2021-04-10 DIAGNOSIS — G4710 Hypersomnia, unspecified: Secondary | ICD-10-CM | POA: Diagnosis not present

## 2021-04-10 DIAGNOSIS — F331 Major depressive disorder, recurrent, moderate: Secondary | ICD-10-CM | POA: Diagnosis not present

## 2021-04-10 DIAGNOSIS — I10 Essential (primary) hypertension: Secondary | ICD-10-CM | POA: Diagnosis not present

## 2021-04-10 DIAGNOSIS — E785 Hyperlipidemia, unspecified: Secondary | ICD-10-CM | POA: Diagnosis not present

## 2021-04-10 DIAGNOSIS — E870 Hyperosmolality and hypernatremia: Secondary | ICD-10-CM | POA: Diagnosis not present

## 2021-04-10 DIAGNOSIS — E1165 Type 2 diabetes mellitus with hyperglycemia: Secondary | ICD-10-CM | POA: Diagnosis not present

## 2021-04-10 DIAGNOSIS — G4700 Insomnia, unspecified: Secondary | ICD-10-CM | POA: Diagnosis not present

## 2021-04-10 DIAGNOSIS — D509 Iron deficiency anemia, unspecified: Secondary | ICD-10-CM | POA: Diagnosis not present

## 2021-04-12 DIAGNOSIS — E785 Hyperlipidemia, unspecified: Secondary | ICD-10-CM | POA: Diagnosis not present

## 2021-04-12 DIAGNOSIS — E11649 Type 2 diabetes mellitus with hypoglycemia without coma: Secondary | ICD-10-CM | POA: Diagnosis not present

## 2021-04-12 DIAGNOSIS — I362 Nonrheumatic tricuspid (valve) stenosis with insufficiency: Secondary | ICD-10-CM | POA: Diagnosis not present

## 2021-04-12 DIAGNOSIS — F331 Major depressive disorder, recurrent, moderate: Secondary | ICD-10-CM | POA: Diagnosis not present

## 2021-05-25 ENCOUNTER — Encounter: Payer: Self-pay | Admitting: Cardiovascular Disease

## 2021-05-25 ENCOUNTER — Other Ambulatory Visit: Payer: Self-pay

## 2021-05-25 ENCOUNTER — Ambulatory Visit (INDEPENDENT_AMBULATORY_CARE_PROVIDER_SITE_OTHER): Payer: Medicare HMO | Admitting: Cardiovascular Disease

## 2021-05-25 VITALS — BP 160/78 | HR 54 | Ht 63.0 in | Wt 147.0 lb

## 2021-05-25 DIAGNOSIS — K3189 Other diseases of stomach and duodenum: Secondary | ICD-10-CM

## 2021-05-25 DIAGNOSIS — K766 Portal hypertension: Secondary | ICD-10-CM

## 2021-05-25 DIAGNOSIS — E7800 Pure hypercholesterolemia, unspecified: Secondary | ICD-10-CM | POA: Diagnosis not present

## 2021-05-25 DIAGNOSIS — I350 Nonrheumatic aortic (valve) stenosis: Secondary | ICD-10-CM

## 2021-05-25 DIAGNOSIS — K7460 Unspecified cirrhosis of liver: Secondary | ICD-10-CM | POA: Diagnosis not present

## 2021-05-25 DIAGNOSIS — I700 Atherosclerosis of aorta: Secondary | ICD-10-CM | POA: Diagnosis not present

## 2021-05-25 DIAGNOSIS — E1165 Type 2 diabetes mellitus with hyperglycemia: Secondary | ICD-10-CM | POA: Diagnosis not present

## 2021-05-25 DIAGNOSIS — I10 Essential (primary) hypertension: Secondary | ICD-10-CM

## 2021-05-25 DIAGNOSIS — R188 Other ascites: Secondary | ICD-10-CM | POA: Diagnosis not present

## 2021-05-25 MED ORDER — SPIRONOLACTONE 25 MG PO TABS: 25.0000 mg | ORAL_TABLET | Freq: Every day | ORAL | 0 refills | Status: DC

## 2021-05-25 NOTE — Progress Notes (Signed)
Cardiology Office Note:    Date:  05/26/2021   ID:  [name] [name], DOB 07-26-1951, MRN [id number]  PCP:  [name], MD   [facility] Providers Cardiologist:  Sanda Klein, MD  Referring MD: Gatha Mayer, MD   No chief complaint on file. [name] [name] is a 70 y.o. female who is being seen today for the evaluation of murmur/AV disease at the request of Gatha Mayer, MD.     History of Present Illness:    [name] [name] is a 70 y.o. 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  She denies problems with angina or dyspnea either at rest or with activity.  She has occasional random palpitations, not associated with any particular circumstance.  She describes them as a nuisance.  They do not associate dizziness or presyncope.  She has not had problems with lower extremity edema (although imaging studies have shown a small amount of ascites).  Her diagnosis of cirrhosis is relatively recent.  She underwent endoscopy which presence of grade 2 esophageal varices and portal hypertensive gastropathy.  Had a remote history of statin intolerance, but doing OK on rosuvastatin 5 mg 3 days a week. Had a normal nuclear stress test in 2010.  Echocardiogram in 2019 showed aortic valve sclerosis with mild insufficiency but no stenosis.  LVEF 60 to 65%.  Past Medical History:  Diagnosis Date   Allergy    Anemia, unspecified    Anxiety    Arthritis    Chronic diarrhea    Cirrhosis (HCC)    Depression    Depression    Elevated cholesterol    Enterocolitis due to Clostridium difficile, not specified as recurrent    Gastro-esophageal reflux disease with esophagitis    Helicobacter pylori gastritis 07/10/2017   Dx EGD -  1) Omeprazole 20 mg 2 times a day x 14 d 2)  Pepto Bismol 2 tabs (262 mg each) 4 times a day x 14 d 3) Metronidazole 250 mg 4 times a day x 14 d 4) doxycycline 100 mg 2 times a day x 14 d  After 14 d stop omeprazole also  In 4 weeks after treatment completed do H. Pylori stool antigen - dx H. Pylori gastritis    Hx of adenomatous colonic polyps 10/05/2015   Hyperlipemia 02/21/09   NUC STRESS TEST-NORMAL- EF 74%   Iron deficiency anemia    Irritable bowel syndrome    Localized edema    Major depressive disorder, single episode, unspecified    MVP (mitral valve prolapse)    Rectocele    Thrombocytopenia (HCC)    Type 2 diabetes mellitus with hyperglycemia ([place])     Past Surgical History:  Procedure Laterality Date   ABDOMINAL HYSTERECTOMY     BILATERAL SALPINGOOPHORECTOMY     carpel tunnel Bilateral 12/17/2009   COLONOSCOPY     ESOPHAGOGASTRODUODENOSCOPY     TUBAL LIGATION      Current Medications: Current Meds  Medication Sig   atenolol (TENORMIN) 50 MG tablet TAKE 1 TABLET BY MOUTH TWICE A DAY   BIOTIN PO Take 500 mg by mouth daily.    ferrous sulfate 325 (65 FE) MG tablet Take 325 mg by mouth daily with breakfast.   glipiZIDE (GLUCOTROL) 10 MG tablet Take 10 mg by mouth daily before breakfast. 1 Tablet Daily  Before Breakfast   Insulin Pen Needle 32G X 4 MM MISC 1 EACH BY DOES NOT APPLY ROUTE 4 (FOUR) TIMES DAILY.   lisinopril (ZESTRIL) 10 MG tablet Take 1 tablet (10 mg total) by mouth daily.   rosuvastatin (CRESTOR) 5 MG tablet Take 5 mg by mouth daily. Takes one on Monday, Wed., and Friday   sertraline (ZOLOFT) 100 MG tablet Take 100 mg by mouth daily. 1 Tablet Daily   spironolactone (ALDACTONE) 25 MG tablet Take 1 tablet (25 mg total) by mouth daily.   [DISCONTINUED] rifaximin (XIFAXAN) 550 MG TABS tablet Take 1 tablet (550 mg total) by mouth 2 (two) times daily.   Current Facility-Administered Medications for the 05/25/21 encounter (Office Visit) with Naysha Sholl, MD  Medication   0.9 %  sodium chloride infusion      Allergies:   Codeine   Social History   Socioeconomic History   Marital status: Married    Spouse name: Louie Casa   Number of children: 2   Years of education: Not on file   Highest education level: Not on file  Occupational History   Not on file  Tobacco Use   Smoking status: Former    Years: 30.00    Pack years: 0.00    Types: Cigarettes    Quit date: 09/03/1997    Years since quitting: 23.7   Smokeless tobacco: Current    Types: Snuff   Tobacco comments:    call when ready to quit  Vaping Use   Vaping Use: Never used  Substance and Sexual Activity   Alcohol use: No    Alcohol/week: 0.0 standard drinks   Drug use: No   Sexual activity: Not on file  Other Topics Concern   Not on file  Social History Narrative   Married 2 sons 1 lives in [place] 1 lives in [place]   She is a housewife   No alcohol tobacco or drug use at this time   Former smoker quit in [place] Determinants of Health   Financial Resource Strain: Not on file  Food Insecurity: Not on file  Transportation Needs: Not on file  Physical Activity: Not on file  Stress: Not on file  Social Connections: Not on file     Family History: The patient's family history includes ADD / ADHD in her brother; Alcohol abuse in her brother; Anxiety disorder in her maternal aunt, maternal grandmother, and mother; Bipolar disorder in an other family member; Cirrhosis in her sister; Diabetes in her maternal uncle, sister, and sister; Drug abuse in her brother; Emphysema in her father; Hyperlipidemia in her sister and sister. There is no history of Dementia, OCD, Paranoid behavior, Physical abuse, Schizophrenia, Seizures, Sexual abuse, Colon cancer, Colon polyps, Pancreatic cancer, Esophageal cancer, Stomach cancer, Kidney disease, Liver disease, or Rectal cancer.  ROS:   Please see the history of present illness.     All other systems reviewed and are negative.  EKGs/Labs/Other Studies Reviewed:    The  following studies were reviewed today: Echo Dec 27, 2017  - Left ventricle: The cavity size was normal. Wall thickness was normal. Systolic function was normal. The estimated ejection fraction was in the range of 60% to 65%. Wall motion was normal;  there were no regional wall motion abnormalities. The study is not technically sufficient to allow evaluation of LV diastolic function.  - Aortic valve: Mildly calcified annulus. Trileaflet; mildly calcified leaflets. There was mild regurgitation.  - Mitral valve: Mildly calcified annulus.  There was mild    regurgitation.  - Right atrium: Central venous pressure (est): 3 mm Hg.  - Atrial septum: No defect or patent foramen ovale was identified.  - Tricuspid valve: There was trivial regurgitation.  - Pulmonary arteries: PA peak pressure: 33 mm Hg (S).  - Pericardium, extracardiac: There was no pericardial effusion.   EKG:  EKG is  ordered today.  The ekg ordered today demonstrates sinus bradycardia 54 bpm with low voltage in the limb leads with normal precordial voltage, nonspecific diffuse T wave changes, QTC 445 ms.  Recent Labs: No results found for requested labs within last 8760 hours.  Recent Lipid Panel    Component Value Date/Time   CHOL 168 12/02/2018 0000   TRIG 87 12/02/2018 0000   HDL 55 12/02/2018 0000   CHOLHDL 2.9 04/16/2018 0732   VLDL 27 09/13/2014 0730   LDLCALC 95 12/02/2018 0000   LDLCALC 105 (H) 04/16/2018 0732     Risk Assessment/Calculations:       Physical Exam:    VS:  BP (!) 160/78   Pulse (!) 54   Ht 5\' 3"  (1.6 m)   Wt 147 lb (66.7 kg)   SpO2 95%   BMI 26.04 kg/m     Wt Readings from Last 3 Encounters:  05/25/21 147 lb (66.7 kg)  03/24/21 145 lb (65.8 kg)  02/09/21 145 lb 12.8 oz (66.1 kg)     GEN:  Well nourished, well developed in no acute distress.  Appears slightly pale HEENT: Normal, no scleral jaundice NECK: No JVD; No carotid bruits LYMPHATICS: No lymphadenopathy CARDIAC: RRR, normal  first and second heart sound, remarkably loud but early peaking 3/6 murmur heard best along the right upper sternal border, but radiating broadly all the way to the apex, to both carotids and even heard well in the left side of her back; no diastolic murmurs, rubs, gallops RESPIRATORY:  Clear to auscultation without rales, wheezing or rhonchi  ABDOMEN: Soft, non-tender, non-distended MUSCULOSKELETAL:  No edema; No deformity  SKIN: Warm and dry NEUROLOGIC:  Alert and oriented x 3 PSYCHIATRIC:  Normal affect   ASSESSMENT:    1. Nonrheumatic aortic valve stenosis   2. Essential hypertension, benign   3. Hypercholesterolemia   4. Atherosclerosis of aorta ([place])   5. Cirrhosis of liver with ascites, unspecified hepatic cirrhosis type ([place])   6. Portal hypertensive gastropathy ([place])   7. Type 2 diabetes mellitus with hyperglycemia, without long-term current use of insulin (HCC)    PLAN:    In order of problems listed above:  AS: Her murmur is strongly suggestive of aortic valve stenosis.  It is likely that it is augmented by the high cardiac output of cirrhosis, but nevertheless the fact that it radiates all the way up to her carotids and in her back suggest that it is a meaningful obstruction and not just aortic valve sclerosis.  We will repeat her echocardiogram.  If she does have aortic stenosis, it appears to be completely asymptomatic at this time.   HLP: Total cholesterol especially LDL cholesterol very high.  LDL was 201.  I do not think she was taking rosuvastatin at the time but she is only able to tolerate a very low-dose of 5 mg 3 days a week.  As far as I can tell she does not have any established CAD or PAD, but aortic atherosclerosis was seen on her abdominal CT.  Need to keep in mind that she has advanced liver disease and  statins may not be the best choice.  We will repeat her lipid panel. HTN: Her blood pressure is elevated today.  We will start spironolactone since this would have  benefit with the hyperaldosteronism associated with cirrhosis especially since she has some subtle evidence of hypervolemia.  Need to repeat her potassium level and renal function tests after a few weeks. Cirrhosis: Presumed to be secondary to nonalcoholic steatohepatitis.  She has evidence of significant portal hypertension with esophageal varices, splenomegaly with thrombocytopenia and some mild ascites on imaging studies.  Notes mention that she had some symptoms suggestive of possible hepatic encephalopathy, although today this is not at all apparent.  She is on treatment with rifaximin.  Need to avoid hepatotoxic drugs.  She does not drink alcohol.  Evaluation for alpha-1 antitrypsin deficiency and hemochromatosis with negative results. DM: Borderline control.  Caution with aggressive management in the setting of cirrhosis.  At high risk for developing hypoglycemia.        Medication Adjustments/Labs and Tests Ordered: Current medicines are reviewed at length with the patient today.  Concerns regarding medicines are outlined above.  Orders Placed This Encounter  Procedures   Lipid panel   Comprehensive metabolic panel   EKG 35-TDDU   ECHOCARDIOGRAM COMPLETE   Meds ordered this encounter  Medications   spironolactone (ALDACTONE) 25 MG tablet    Sig: Take 1 tablet (25 mg total) by mouth daily.    Dispense:  90 tablet    Refill:  0    Patient Instructions  Medication Instructions:  START Spironolactone 25 mg once daily  *If you need a refill on your cardiac medications before your next appointment, please call your pharmacy*   Lab Work: Your provider would like for you to return in one month to have the following labs drawn: Fasting Lipid and CMET. You do not need an appointment for the lab. Once in our office lobby there is a podium where you can sign in and ring the doorbell to alert [place] that you are here. The lab is open from 8:00 am to 4:30 pm; closed for lunch from  12:45pm-1:45pm.  If you have labs (blood work) drawn today and your tests are completely normal, you will receive your results only by: [place] (if you have MyChart) OR A paper copy in the mail If you have any lab test that is abnormal or we need to change your treatment, we will call you to review the results.   Testing/Procedures: Your physician has requested that you have an echocardiogram. Echocardiography is a painless test that uses sound waves to create images of your heart. It provides your doctor with information about the size and shape of your heart and how well your heart's chambers and valves are working. You may receive an ultrasound enhancing agent through an IV if needed to better visualize your heart during the echo.This procedure takes approximately one hour. There are no restrictions for this procedure. This will take place at the [address], [address].   Follow-Up: At [facility], you and your health needs are our priority.  As part of our continuing mission to provide you with exceptional heart care, we have created designated Provider Care Teams.  These Care Teams include your primary Cardiologist (physician) and Advanced Practice Providers (APPs -  Physician Assistants and Nurse Practitioners) who all work together to provide you with the care you need, when you need it.  We recommend signing up for the patient portal called "  MyChart".  Sign up information is provided on this After Visit Summary.  MyChart is used to connect with patients for Virtual Visits (Telemedicine).  Patients are able to view lab/test results, encounter notes, upcoming appointments, etc.  Non-urgent messages can be sent to your provider as well.   To learn more about what you can do with MyChart, go to [email or web address].    Your next appointment:   3 month(s)  The format for your next appointment:   In Person  Provider:   You may see Sanda Klein, MD or one of the  following Advanced Practice Providers on your designated Care Team:   Almyra Deforest, PA-C Fabian Sharp, [place] or  Roby Lofts, PA-C    Signed, Sanda Klein, MD  05/26/2021 2:54 PM    [place]

## 2021-05-25 NOTE — Patient Instructions (Signed)
Medication Instructions:  START Spironolactone 25 mg once daily  *If you need a refill on your cardiac medications before your next appointment, please call your pharmacy*   Lab Work: Your provider would like for you to return in one month to have the following labs drawn: Fasting Lipid and CMET. You do not need an appointment for the lab. Once in our office lobby there is a podium where you can sign in and ring the doorbell to alert [place] that you are here. The lab is open from 8:00 am to 4:30 pm; closed for lunch from 12:45pm-1:45pm.  If you have labs (blood work) drawn today and your tests are completely normal, you will receive your results only by: [place] (if you have MyChart) OR A paper copy in the mail If you have any lab test that is abnormal or we need to change your treatment, we will call you to review the results.   Testing/Procedures: Your physician has requested that you have an echocardiogram. Echocardiography is a painless test that uses sound waves to create images of your heart. It provides your doctor with information about the size and shape of your heart and how well your heart's chambers and valves are working. You may receive an ultrasound enhancing agent through an IV if needed to better visualize your heart during the echo.This procedure takes approximately one hour. There are no restrictions for this procedure. This will take place at the [address].   Follow-Up: At [facility], you and your health needs are our priority.  As part of our continuing mission to provide you with exceptional heart care, we have created designated Provider Care Teams.  These Care Teams include your primary Cardiologist (physician) and Advanced Practice Providers (APPs -  Physician Assistants and Nurse Practitioners) who all work together to provide you with the care you need, when you need it.  We recommend signing up for the patient portal called "MyChart".  Sign  up information is provided on this After Visit Summary.  MyChart is used to connect with patients for Virtual Visits (Telemedicine).  Patients are able to view lab/test results, encounter notes, upcoming appointments, etc.  Non-urgent messages can be sent to your provider as well.   To learn more about what you can do with MyChart, go to [email or web address].    Your next appointment:   3 month(s)  The format for your next appointment:   In Person  Provider:   You may see Sanda Klein, MD or one of the following Advanced Practice Providers on your designated Care Team:   Almyra Deforest, PA-C Fabian Sharp, PA-C or  Roby Lofts, [place]

## 2021-05-29 ENCOUNTER — Ambulatory Visit: Payer: Medicare HMO | Admitting: Internal Medicine

## 2021-05-29 NOTE — Progress Notes (Signed)
I called and left her a detailed message to call us back to get an appointment.

## 2021-05-29 NOTE — Progress Notes (Signed)
Thanks, [name].  [name] [name] -  - please schedule her  a follow-up with me next available ok

## 2021-05-31 ENCOUNTER — Encounter: Payer: Self-pay | Admitting: Internal Medicine

## 2021-05-31 ENCOUNTER — Other Ambulatory Visit: Payer: Self-pay

## 2021-05-31 ENCOUNTER — Ambulatory Visit (INDEPENDENT_AMBULATORY_CARE_PROVIDER_SITE_OTHER): Payer: Medicare HMO | Admitting: Internal Medicine

## 2021-05-31 VITALS — BP 186/64 | HR 65 | Ht 63.0 in | Wt 146.0 lb

## 2021-05-31 DIAGNOSIS — IMO0002 Reserved for concepts with insufficient information to code with codable children: Secondary | ICD-10-CM

## 2021-05-31 DIAGNOSIS — E118 Type 2 diabetes mellitus with unspecified complications: Secondary | ICD-10-CM

## 2021-05-31 DIAGNOSIS — K7460 Unspecified cirrhosis of liver: Secondary | ICD-10-CM

## 2021-05-31 DIAGNOSIS — E782 Mixed hyperlipidemia: Secondary | ICD-10-CM

## 2021-05-31 DIAGNOSIS — R188 Other ascites: Secondary | ICD-10-CM

## 2021-05-31 DIAGNOSIS — K529 Noninfective gastroenteritis and colitis, unspecified: Secondary | ICD-10-CM

## 2021-05-31 DIAGNOSIS — Z7689 Persons encountering health services in other specified circumstances: Secondary | ICD-10-CM | POA: Diagnosis not present

## 2021-05-31 DIAGNOSIS — I350 Nonrheumatic aortic (valve) stenosis: Secondary | ICD-10-CM | POA: Diagnosis not present

## 2021-05-31 DIAGNOSIS — I10 Essential (primary) hypertension: Secondary | ICD-10-CM

## 2021-05-31 DIAGNOSIS — F329 Major depressive disorder, single episode, unspecified: Secondary | ICD-10-CM | POA: Diagnosis not present

## 2021-05-31 DIAGNOSIS — Z794 Long term (current) use of insulin: Secondary | ICD-10-CM | POA: Diagnosis not present

## 2021-05-31 DIAGNOSIS — W57XXXA Bitten or stung by nonvenomous insect and other nonvenomous arthropods, initial encounter: Secondary | ICD-10-CM

## 2021-05-31 DIAGNOSIS — E1165 Type 2 diabetes mellitus with hyperglycemia: Secondary | ICD-10-CM

## 2021-05-31 LAB — POCT GLYCOSYLATED HEMOGLOBIN (HGB A1C): HbA1c, POC (controlled diabetic range): 7.9 % — AB (ref 0.0–7.0)

## 2021-05-31 MED ORDER — TRIAMCINOLONE ACETONIDE 0.1 % EX CREA: 1.0000 "application " | TOPICAL_CREAM | Freq: Two times a day (BID) | CUTANEOUS | 0 refills | Status: DC

## 2021-05-31 MED ORDER — DOXYCYCLINE HYCLATE 100 MG PO TABS: 200.0000 mg | ORAL_TABLET | Freq: Once | ORAL | 0 refills | Status: AC

## 2021-05-31 MED ORDER — ESCITALOPRAM OXALATE 5 MG PO TABS
5.0000 mg | ORAL_TABLET | Freq: Every day | ORAL | 1 refills | Status: DC
Start: 2021-05-31 — End: 2021-06-26

## 2021-05-31 MED ORDER — TOUJEO SOLOSTAR 300 UNIT/ML ~~LOC~~ SOPN
60.0000 [IU] | PEN_INJECTOR | SUBCUTANEOUS | 2 refills | Status: DC
Start: 2021-05-31 — End: 2022-09-07

## 2021-05-31 NOTE — Assessment & Plan Note (Addendum)
HbA1C: 7.9 today in office On Toujeo 60 mg qHS and Glipizide 10 mg QD Did not tolerate Metformin in the past Advised to follow diabetic diet On ACEi and statin F/u CMP and lipid panel Diabetic eye exam: Advised to follow up with Ophthalmology for diabetic eye exam

## 2021-05-31 NOTE — Progress Notes (Signed)
New Patient Office Visit  Subjective:  Patient ID: [name] [name], female    DOB: [birth year]  Age: 70 y.o. MRN: [id number]  CC:  Chief Complaint  Patient presents with   New Patient (Initial Visit)    Here to establish care. Sees cardiology for hypertension. Wants to discuss starting an antidepressant for depression and fatigue.    HPI [name] [name] is a 70 year old female with PMH of NASH related liver cirrhosis, portal hypertensive gastropathy, esophageal varices, HTN, AS, DM, HLD, MGUS, iron deficiency anemia, depression and anxiety who presents for establishing care. She is a former patient of Dr Nevada Crane.  HTN: BP was elevated in the office today. She has been taking Lisinopril, Atenolol and recently started Spironolactone regularly. Denies any headache, dizziness, chest pain, dyspnea or palpitations. She also has h/o AS and is closely followed by Cardiologist - has an appointment in the next week.  DM: HbA1C was 7.9 in the office today, which is better compared to prior. She is on Toujeo and Glipizide. She admits that she can improve on her diet. Denies polyuria or polydipsia.  NASH: Has h/o liver cirrhosis and follows up with GI. She also has chronic diarrhea, which is better with Imodium PRN now.  Depression: She had drowsiness and diarrhea while taking Zoloft, but felt better in terms of mood while taking it. Later, she was placed on Cymbalta, which did not help. She tried another brand name antidepressant, but could not afford to continue it. Denies SI or HI.  Insect bite: Had about a week ago and has an area of redness over RLQ abdominal wall, which is itching. Denies fever, fatigue or burning in the area.  She has had 2 doses of COVID vaccine. She has had PPSV23.  Past Medical History:  Diagnosis Date   Allergy    Anemia, unspecified    Anxiety    Arthritis    Chronic diarrhea    Cirrhosis (HCC)    Depression    Depression    Elevated cholesterol    Enterocolitis  due to Clostridium difficile, not specified as recurrent    Gastro-esophageal reflux disease with esophagitis    Helicobacter pylori gastritis 07/10/2017   Dx EGD -  1) Omeprazole 20 mg 2 times a day x 14 d 2) Pepto Bismol 2 tabs (262 mg each) 4 times a day x 14 d 3) Metronidazole 250 mg 4 times a day x 14 d 4) doxycycline 100 mg 2 times a day x 14 d  After 14 d stop omeprazole also  In 4 weeks after treatment completed do H. Pylori stool antigen - dx H. Pylori gastritis    Hx of adenomatous colonic polyps 10/05/2015   Hyperlipemia 02/21/09   NUC STRESS TEST-NORMAL- EF 74%   Iron deficiency anemia    Irritable bowel syndrome    Localized edema    Major depressive disorder, single episode, unspecified    MVP (mitral valve prolapse)    Rectocele    Thrombocytopenia (HCC)    Type 2 diabetes mellitus with hyperglycemia ([facility])     Past Surgical History:  Procedure Laterality Date   ABDOMINAL HYSTERECTOMY     BILATERAL SALPINGOOPHORECTOMY     carpel tunnel Bilateral 12/17/2009   COLONOSCOPY     ESOPHAGOGASTRODUODENOSCOPY     TUBAL LIGATION      Family History  Problem Relation Age of Onset   Anxiety disorder Mother    ADD / ADHD Brother    Alcohol abuse Brother  x 3   Drug abuse Brother    Anxiety disorder Maternal Aunt    Anxiety disorder Maternal Grandmother    Bipolar disorder Other    Emphysema Father    Diabetes Sister    Hyperlipidemia Sister    Cirrhosis Sister    Hyperlipidemia Sister    Diabetes Sister    Diabetes Maternal Uncle    Dementia Neg Hx    OCD Neg Hx    Paranoid behavior Neg Hx    Physical abuse Neg Hx    Schizophrenia Neg Hx    Seizures Neg Hx    Sexual abuse Neg Hx    Colon cancer Neg Hx    Colon polyps Neg Hx    Pancreatic cancer Neg Hx    Esophageal cancer Neg Hx    Stomach cancer Neg Hx    Kidney disease Neg Hx    Liver disease Neg Hx    Rectal cancer Neg Hx     Social History   Socioeconomic History   Marital status: Married     Spouse name: Louie Casa   Number of children: 2   Years of education: Not on file   Highest education level: Not on file  Occupational History   Not on file  Tobacco Use   Smoking status: Former    Years: 30.00    Pack years: 0.00    Types: Cigarettes    Quit date: 09/03/1997    Years since quitting: 23.7   Smokeless tobacco: Current    Types: Snuff   Tobacco comments:    call when ready to quit  Vaping Use   Vaping Use: Never used  Substance and Sexual Activity   Alcohol use: No    Alcohol/week: 0.0 standard drinks   Drug use: No   Sexual activity: Not on file  Other Topics Concern   Not on file  Social History Narrative   Married 2 sons 1 lives in [place] 1 lives in [place]   She is a housewife   No alcohol tobacco or drug use at this time   Former smoker quit in [place] Determinants of Health   Financial Resource Strain: Not on file  Food Insecurity: Not on file  Transportation Needs: Not on file  Physical Activity: Not on file  Stress: Not on file  Social Connections: Not on file  Intimate Partner Violence: Not on file    ROS Review of Systems  Constitutional:  Negative for chills and fever.  HENT:  Negative for congestion, sinus pressure, sinus pain and sore throat.   Eyes:  Negative for pain and discharge.  Respiratory:  Negative for cough and shortness of breath.   Cardiovascular:  Negative for chest pain and palpitations.  Gastrointestinal:  Positive for abdominal distention. Negative for abdominal pain, constipation, diarrhea, nausea and vomiting.  Endocrine: Negative for polydipsia and polyuria.  Genitourinary:  Negative for dysuria and hematuria.  Musculoskeletal:  Negative for neck pain and neck stiffness.  Skin:  Positive for rash.  Neurological:  Negative for dizziness and weakness.  Psychiatric/Behavioral:  Positive for dysphoric mood and sleep disturbance. Negative for agitation and behavioral problems. The patient is nervous/anxious.     Objective:   Today's Vitals: BP (!) 186/64 (BP Location: Right Arm, Patient Position: Sitting, Cuff Size: Normal)   Pulse 65   Ht 5\' 3"  (1.6 m)   Wt 146 lb (66.2 kg)   SpO2 95%   BMI 25.86 kg/m   Physical Exam Vitals  reviewed.  Constitutional:      General: She is not in acute distress.    Appearance: She is not diaphoretic.  HENT:     Head: Normocephalic and atraumatic.     Nose: Nose normal.     Mouth/Throat:     Mouth: Mucous membranes are moist.  Eyes:     General: No scleral icterus.    Extraocular Movements: Extraocular movements intact.  Cardiovascular:     Rate and Rhythm: Normal rate and regular rhythm.     Pulses: Normal pulses.     Heart sounds: Murmur (Systolic over left upper sternal border) heard.  Pulmonary:     Breath sounds: Normal breath sounds. No wheezing or rales.  Abdominal:     Palpations: Abdomen is soft.     Tenderness: There is no abdominal tenderness.  Musculoskeletal:     Cervical back: Neck supple. No tenderness.     Right lower leg: No edema.     Left lower leg: No edema.  Skin:    General: Skin is warm.     Findings: Rash (about 1 cm diameter erythema with central scab) present.  Neurological:     General: No focal deficit present.     Mental Status: She is alert and oriented to person, place, and time.  Psychiatric:        Mood and Affect: Mood normal.        Behavior: Behavior normal.    Assessment & Plan:   Problem List Items Addressed This Visit       Encounter to establish care    -  Primary Care established Previous chart reviewed History and medications reviewed with the patient  Cardiovascular and Mediastinum   Essential hypertension, benign    BP Readings from Last 1 Encounters:  05/31/21 (!) 186/64  uncontrolled with Lisinopril, Atenolol and Spironolactone Closely followed by Cardiology Counseled for compliance with the medications Advised DASH diet and moderate exercise/walking, at least 150 mins/week         Aortic valve stenosis    F/u with Cardiology Denies dyspnea currently, appears euvolemic         Digestive   Cirrhosis of liver with ascites ([place])    Followed by GI Recent EGD showed portal hypertensive gastropathy and esophageal varices On B-blocker for ppx On Spironolactone       Chronic diarrhea    Imodium PRN, now better since discontinuing Zoloft         Endocrine   Uncontrolled type 2 diabetes mellitus with complication, without long-term current use of insulin (HCC)    HbA1C: 7.9 today in office On Toujeo 60 mg qHS and Glipizide 10 mg QD Did not tolerate Metformin in the past Advised to follow diabetic diet On ACEi and statin F/u CMP and lipid panel Diabetic eye exam: Advised to follow up with Ophthalmology for diabetic eye exam        Relevant Medications   insulin glargine, 1 Unit Dial, (TOUJEO SOLOSTAR) 300 UNIT/ML Solostar Pen   Other Relevant Orders   POCT glycosylated hemoglobin (Hb A1C) (Completed)     Other   Mixed hyperlipidemia    On Crestor       Major depressive disorder, single episode, unspecified    Did not tolerate Zoloft - drowsiness and diarrhea Cymbalta was ineffective Started Lexapro 5 mg QD       Relevant Medications   escitalopram (LEXAPRO) 5 MG tablet   Other Visit Diagnoses  Tick bite, unspecified site, initial encounter       Relevant Medications   doxycycline (VIBRA-TABS) 100 MG tablet   triamcinolone cream (KENALOG) 0.1 %       Outpatient Encounter Medications as of 05/31/2021  Medication Sig   atenolol (TENORMIN) 50 MG tablet TAKE 1 TABLET BY MOUTH TWICE A DAY   BIOTIN PO Take 500 mg by mouth daily.    doxycycline (VIBRA-TABS) 100 MG tablet Take 2 tablets (200 mg total) by mouth once for 1 dose.   escitalopram (LEXAPRO) 5 MG tablet Take 1 tablet (5 mg total) by mouth daily.   ferrous sulfate 325 (65 FE) MG tablet Take 325 mg by mouth daily with breakfast.   glipiZIDE (GLUCOTROL) 10 MG tablet Take  10 mg by mouth daily before breakfast. 1 Tablet Daily Before Breakfast   Insulin Pen Needle 32G X 4 MM MISC 1 EACH BY DOES NOT APPLY ROUTE 4 (FOUR) TIMES DAILY.   lisinopril (ZESTRIL) 10 MG tablet Take 1 tablet (10 mg total) by mouth daily.   rosuvastatin (CRESTOR) 5 MG tablet Take 5 mg by mouth daily. Takes one on Monday, Wed., and Friday   spironolactone (ALDACTONE) 25 MG tablet Take 1 tablet (25 mg total) by mouth daily.   triamcinolone cream (KENALOG) 0.1 % Apply 1 application topically 2 (two) times daily.   [DISCONTINUED] Insulin Glargine, 1 Unit Dial, (TOUJEO SOLOSTAR) 300 UNIT/ML SOPN Inject 60 Units into the skin 1 day or 1 dose for 1 dose.   [DISCONTINUED] sertraline (ZOLOFT) 100 MG tablet Take 100 mg by mouth daily. 1 Tablet Daily   insulin glargine, 1 Unit Dial, (TOUJEO SOLOSTAR) 300 UNIT/ML Solostar Pen Inject 60 Units into the skin 1 day or 1 dose for 1 dose.   Facility-Administered Encounter Medications as of 05/31/2021  Medication   0.9 %  sodium chloride infusion    Follow-up: Return in about 6 weeks (around 07/12/2021) for Depression.   Lindell Spar, MD

## 2021-05-31 NOTE — Assessment & Plan Note (Signed)
On Crestor 

## 2021-05-31 NOTE — Assessment & Plan Note (Signed)
Followed by GI Recent EGD showed portal hypertensive gastropathy and esophageal varices On B-blocker for ppx On Spironolactone

## 2021-05-31 NOTE — Patient Instructions (Signed)
Please start taking half tablet of Zoloft for a week. Then, switch to Escitalopram.  Please continue to take other medications as prescribed.  Please follow low carb diet and ambulate as tolerated.

## 2021-05-31 NOTE — Assessment & Plan Note (Signed)
BP Readings from Last 1 Encounters:  05/31/21 (!) 186/64   uncontrolled with Lisinopril, Atenolol and Spironolactone Closely followed by Cardiology Counseled for compliance with the medications Advised DASH diet and moderate exercise/walking, at least 150 mins/week

## 2021-05-31 NOTE — Assessment & Plan Note (Signed)
F/u with Cardiology Denies dyspnea currently, appears euvolemic

## 2021-05-31 NOTE — Assessment & Plan Note (Signed)
Did not tolerate Zoloft - drowsiness and diarrhea Cymbalta was ineffective Started Lexapro 5 mg QD

## 2021-05-31 NOTE — Assessment & Plan Note (Signed)
Imodium PRN, now better since discontinuing Zoloft

## 2021-06-12 NOTE — Progress Notes (Signed)
Left another message to please call us and set up an appointment.

## 2021-06-14 ENCOUNTER — Ambulatory Visit (HOSPITAL_COMMUNITY)
Admission: RE | Admit: 2021-06-14 | Discharge: 2021-06-14 | Disposition: A | Discharge status: Home / Self Care | Payer: Medicare HMO | Source: Ambulatory Visit | Attending: Cardiovascular Disease | Admitting: Cardiovascular Disease

## 2021-06-14 ENCOUNTER — Other Ambulatory Visit: Payer: Self-pay

## 2021-06-14 DIAGNOSIS — I350 Nonrheumatic aortic (valve) stenosis: Secondary | ICD-10-CM | POA: Insufficient documentation

## 2021-06-14 LAB — ECHOCARDIOGRAM COMPLETE
AR max vel: 0.94 cm2
AV Area VTI: 1.03 cm2
AV Area mean vel: 1 cm2
AV Mean grad: 19.7 mmHg
AV Peak grad: 35.5 mmHg
Ao pk vel: 2.98 m/s
Area-P 1/2: 2.75 cm2
P 1/2 time: 800 msec
S' Lateral: 3.8 cm

## 2021-06-14 NOTE — Progress Notes (Signed)
*  PRELIMINARY RESULTS* Echocardiogram 2D Echocardiogram has been performed.  [name] [name] 06/14/2021, 11:30 AM

## 2021-06-22 NOTE — Progress Notes (Signed)
I left another message to please call us to set up an appointment.

## 2021-06-23 ENCOUNTER — Other Ambulatory Visit: Payer: Self-pay | Admitting: *Deleted

## 2021-06-23 DIAGNOSIS — I350 Nonrheumatic aortic (valve) stenosis: Secondary | ICD-10-CM

## 2021-06-24 ENCOUNTER — Other Ambulatory Visit: Payer: Self-pay | Admitting: Internal Medicine

## 2021-06-24 DIAGNOSIS — F329 Major depressive disorder, single episode, unspecified: Secondary | ICD-10-CM

## 2021-06-24 IMAGING — CT CT ABD-PELV W/O CM
2 of 4 series · 15 of 46 positions shown, 17 images · non-contrast
Comparison: None.

CLINICAL DATA: 69-year-old female with concern for splenomegaly.

EXAM:
CT ABDOMEN AND PELVIS WITHOUT CONTRAST
TECHNIQUE: Multidetector CT imaging of the abdomen and pelvis was performed
following the standard protocol without IV contrast.

[Series 2: axial st · axial · 0.68mm/px · z∈[+752,+1197]mm · 12 of 101 slices shown, 14 images]
[im 6/101  soft-tissue]
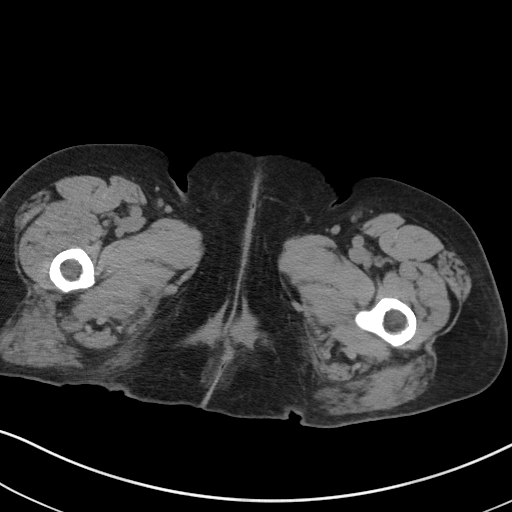
[im 6/101  bone]
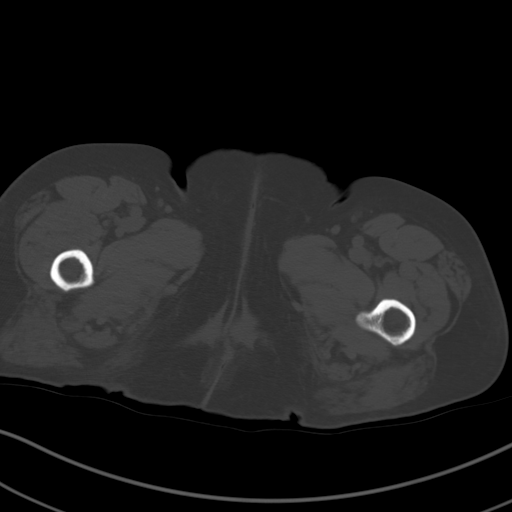
[im 17/101  soft-tissue]
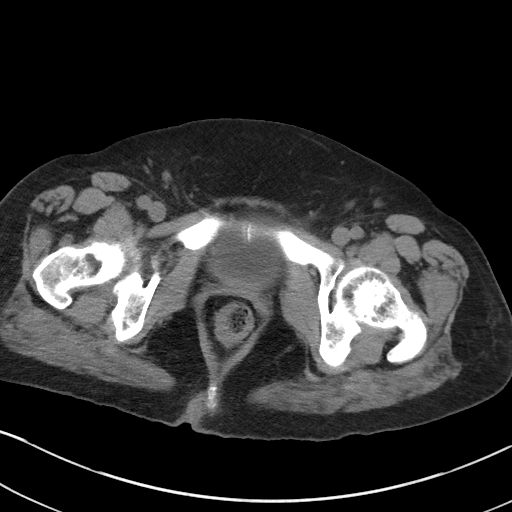
[im 23/101  soft-tissue]
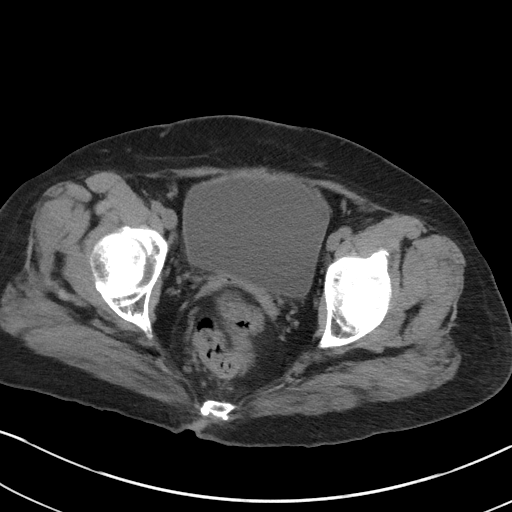
[im 28/101  soft-tissue]
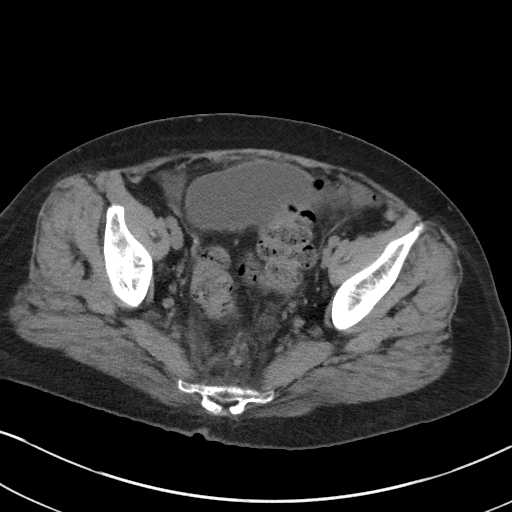
[im 39/101  soft-tissue]
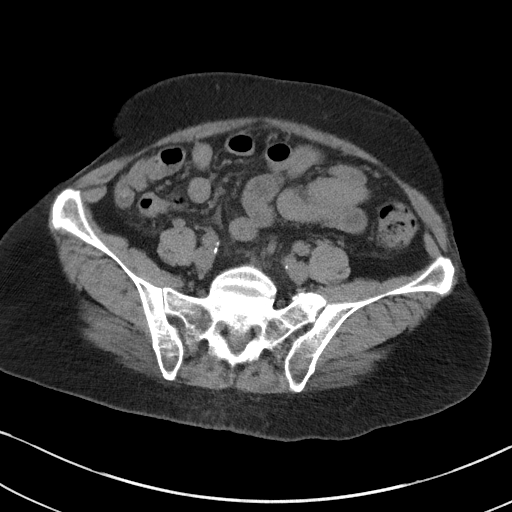
[im 45/101  soft-tissue]
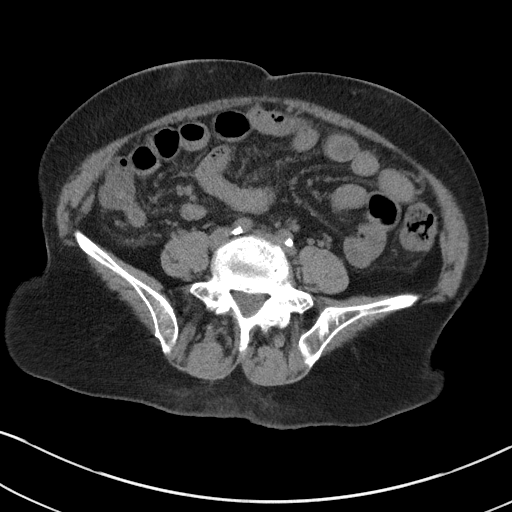
[im 56/101  soft-tissue]
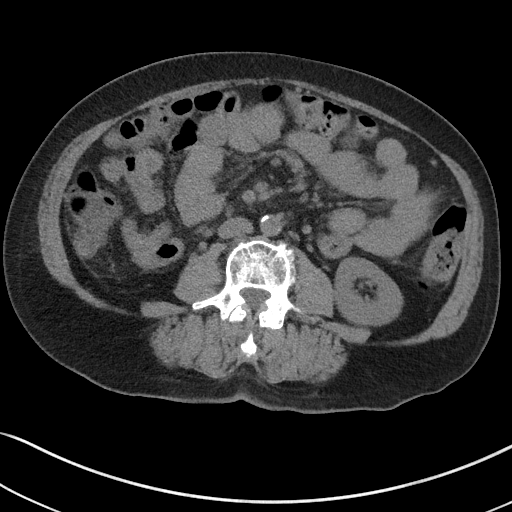
[im 62/101  soft-tissue]
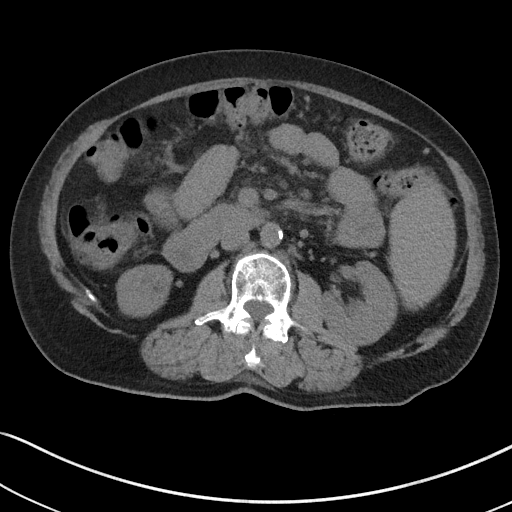
[im 73/101  soft-tissue]
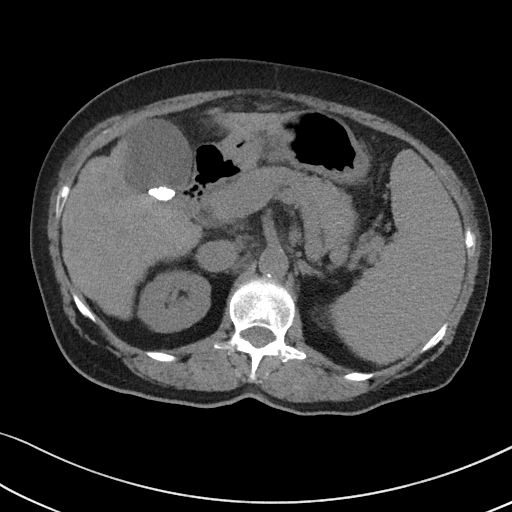
[im 73/101  bone]
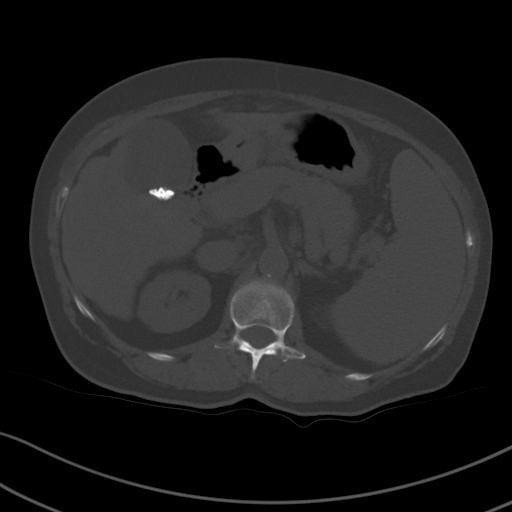
[im 78/101  soft-tissue]
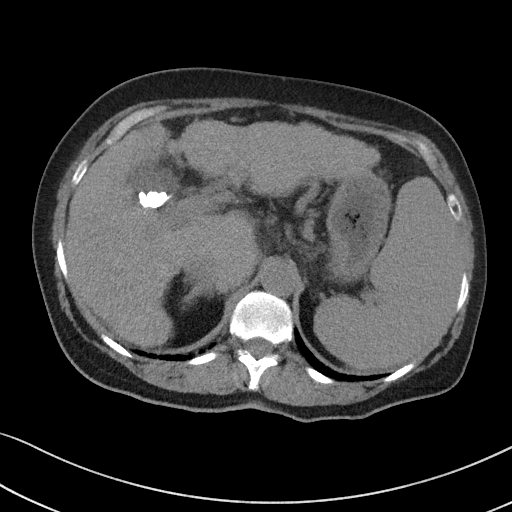
[im 84/101  soft-tissue]
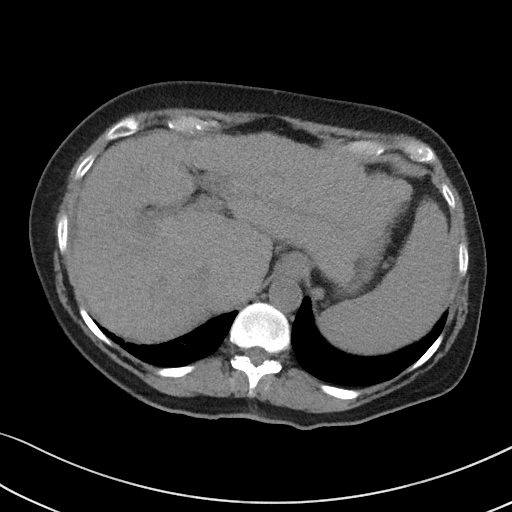
[im 95/101  soft-tissue]
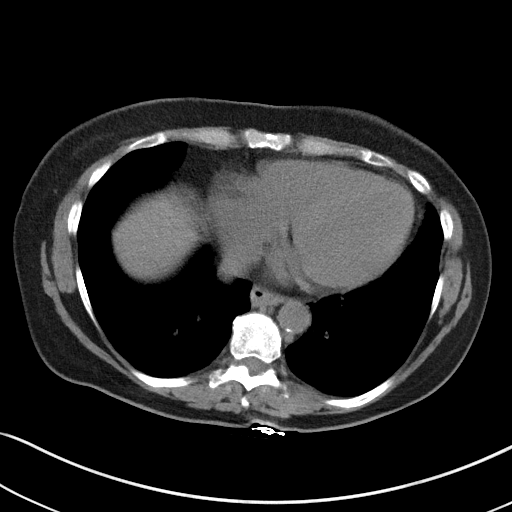

[Series 5: coronal st · coronal · 0.66mm/px · 3 of 84 slices shown]
[im 28/84  soft-tissue]
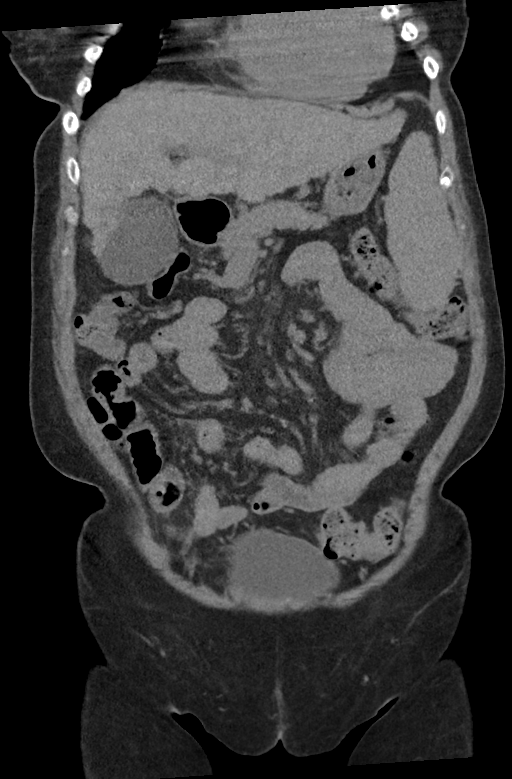
[im 37/84  soft-tissue]
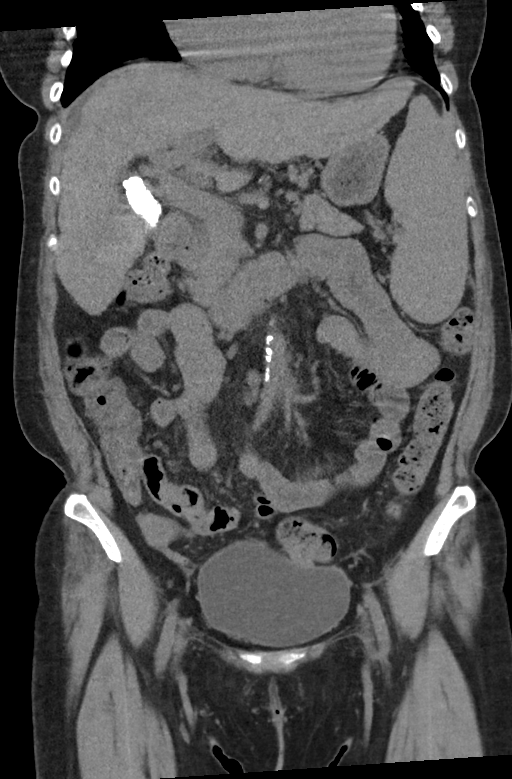
[im 47/84  soft-tissue]
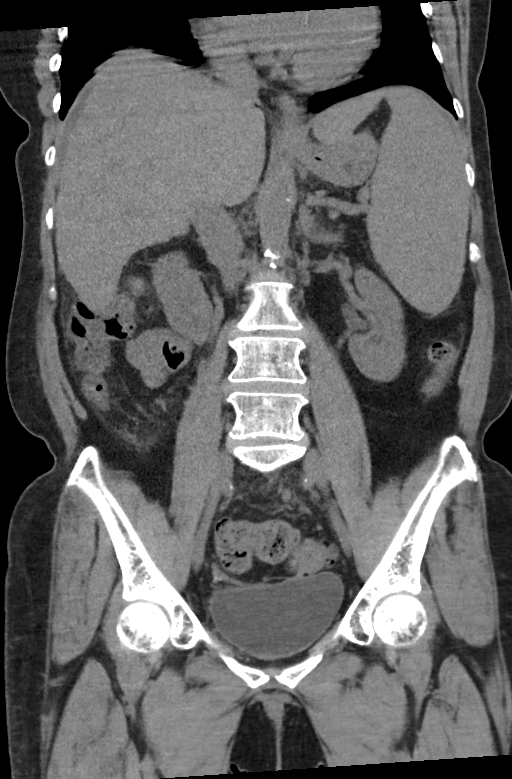

[15 of 46 positions shown; findings below may reference images not displayed]

FINDINGS: Evaluation of this exam is limited in the absence of intravenous
contrast.

Lower chest: Bibasilar atelectasis. The visualized lung bases are
otherwise clear.

No intra-abdominal free air. Small perihepatic ascites.

Hepatobiliary: Cirrhosis. No intrahepatic biliary dilatation.
Multiple stones noted in the gallbladder. No pericholecystic fluid
or evidence of acute cholecystitis by CT.

Pancreas: Unremarkable. No pancreatic ductal dilatation or
surrounding inflammatory changes.

Spleen: Splenomegaly measuring up to 15 mm in greatest length.

Adrenals/Urinary Tract: The left adrenal gland is unremarkable. The
right adrenal gland is poorly visualized. There is a 1 cm right
renal inferior pole hypodense lesion which is not characterized on
this CT. There is no hydronephrosis or nephrolithiasis on either
side. The visualized ureters and urinary bladder appear
unremarkable.

Stomach/Bowel: There is moderate stool throughout the colon. There
is no bowel obstruction or active inflammation. The appendix is
normal.

Vascular/Lymphatic: Moderate aortoiliac atherosclerotic disease. The
IVC is unremarkable. No portal venous gas. No adenopathy.

Reproductive: Hysterectomy. No adnexal masses.

Other: There is mild diffuse mesenteric edema and vascular
engorgement. There is upper abdominal vascular prominence, likely
varices.

Musculoskeletal: Osteopenia with degenerative changes of the spine.
No acute osseous pathology.
IMPRESSION: 1. No acute intra-abdominal or pelvic pathology.
2. Cirrhosis with evidence of portal hypertension, splenomegaly, and
small perihepatic ascites.
3. Cholelithiasis.
4. Moderate colonic stool burden. No bowel obstruction. Normal
appendix.
5. Aortic Atherosclerosis ([id number]).

## 2021-06-26 ENCOUNTER — Telehealth: Payer: Self-pay

## 2021-06-26 NOTE — Telephone Encounter (Signed)
I've been unable to reach [name] [name] by phone so I'm mailing her a letter to call us and set up an appointment with Dr Carlean Purl.

## 2021-06-26 NOTE — Progress Notes (Signed)
I've been unable to reach her so I'm mailing her a letter to call us please and set up an appointment with Dr Carlean Purl.

## 2021-07-11 ENCOUNTER — Ambulatory Visit: Payer: Medicare HMO | Admitting: Family Medicine

## 2021-07-12 ENCOUNTER — Ambulatory Visit: Payer: Medicare HMO | Admitting: Internal Medicine

## 2021-07-20 DIAGNOSIS — L8990 Pressure ulcer of unspecified site, unspecified stage: Secondary | ICD-10-CM | POA: Diagnosis not present

## 2021-07-20 DIAGNOSIS — L281 Prurigo nodularis: Secondary | ICD-10-CM | POA: Diagnosis not present

## 2021-07-20 DIAGNOSIS — L308 Other specified dermatitis: Secondary | ICD-10-CM | POA: Diagnosis not present

## 2021-08-10 ENCOUNTER — Ambulatory Visit: Payer: Medicare HMO | Admitting: Internal Medicine

## 2021-08-19 ENCOUNTER — Ambulatory Visit (INDEPENDENT_AMBULATORY_CARE_PROVIDER_SITE_OTHER): Payer: Medicare HMO | Admitting: *Deleted

## 2021-08-19 ENCOUNTER — Other Ambulatory Visit: Payer: Self-pay

## 2021-08-19 DIAGNOSIS — Z1231 Encounter for screening mammogram for malignant neoplasm of breast: Secondary | ICD-10-CM

## 2021-08-19 DIAGNOSIS — Z0000 Encounter for general adult medical examination without abnormal findings: Secondary | ICD-10-CM | POA: Diagnosis not present

## 2021-08-19 NOTE — Progress Notes (Signed)
Subjective:   [name] [name] is a 70 y.o. female who presents for Medicare Annual (Subsequent) preventive examination.  I connected with  [name] on 08/19/21 by an audio enabled telemedicine application and verified that I am speaking with the correct person using two identifiers.   I discussed the limitations, risks, security and privacy concerns of performing an evaluation and management service by telephone and the availability of in person appointments. I also discussed with the patient that there may be a patient responsible charge related to this service. The patient expressed understanding and verbally consented to this telephonic visit.   Review of Systems           Objective:    There were no vitals filed for this visit. There is no height or weight on file to calculate BMI.  Advanced Directives 03/23/2020 03/01/2020 10/01/2017 06/26/2017  Does Patient Have a Medical Advance Directive? No No No No  Would patient like information on creating a medical advance directive? No - Patient declined No - Patient declined - -    Current Medications (verified) Outpatient Encounter Medications as of 08/19/2021  Medication Sig   atenolol (TENORMIN) 50 MG tablet TAKE 1 TABLET BY MOUTH TWICE A DAY   BIOTIN PO Take 500 mg by mouth daily.    escitalopram (LEXAPRO) 5 MG tablet TAKE 1 TABLET (5 MG TOTAL) BY MOUTH DAILY.   ferrous sulfate 325 (65 FE) MG tablet Take 325 mg by mouth daily with breakfast.   glipiZIDE (GLUCOTROL) 10 MG tablet Take 10 mg by mouth daily before breakfast. 1 Tablet Daily Before Breakfast   insulin glargine, 1 Unit Dial, (TOUJEO SOLOSTAR) 300 UNIT/ML Solostar Pen Inject 60 Units into the skin 1 day or 1 dose for 1 dose.   Insulin Pen Needle 32G X 4 MM MISC 1 EACH BY DOES NOT APPLY ROUTE 4 (FOUR) TIMES DAILY.   lisinopril (ZESTRIL) 10 MG tablet Take 1 tablet (10 mg total) by mouth daily.   rosuvastatin (CRESTOR) 5 MG tablet Take 5 mg by mouth daily. Takes one  on Monday, Wed., and Friday   spironolactone (ALDACTONE) 25 MG tablet Take 1 tablet (25 mg total) by mouth daily.   triamcinolone cream (KENALOG) 0.1 % Apply 1 application topically 2 (two) times daily.   Facility-Administered Encounter Medications as of 08/19/2021  Medication   0.9 %  sodium chloride infusion    Allergies (verified) Codeine   History: Past Medical History:  Diagnosis Date   Allergy    Anemia, unspecified    Anxiety    Arthritis    Chronic diarrhea    Cirrhosis (HCC)    Depression    Depression    Elevated cholesterol    Enterocolitis due to Clostridium difficile, not specified as recurrent    Gastro-esophageal reflux disease with esophagitis    Helicobacter pylori gastritis 07/10/2017   Dx EGD -  1) Omeprazole 20 mg 2 times a day x 14 d 2) Pepto Bismol 2 tabs (262 mg each) 4 times a day x 14 d 3) Metronidazole 250 mg 4 times a day x 14 d 4) doxycycline 100 mg 2 times a day x 14 d  After 14 d stop omeprazole also  In 4 weeks after treatment completed do H. Pylori stool antigen - dx H. Pylori gastritis    Hx of adenomatous colonic polyps 10/05/2015   Hyperlipemia 02/21/09   NUC STRESS TEST-NORMAL- EF 74%   Iron deficiency anemia    Irritable bowel  syndrome    Localized edema    Major depressive disorder, single episode, unspecified    MVP (mitral valve prolapse)    Rectocele    Thrombocytopenia (HCC)    Type 2 diabetes mellitus with hyperglycemia (HCC)    Past Surgical History:  Procedure Laterality Date   ABDOMINAL HYSTERECTOMY     BILATERAL SALPINGOOPHORECTOMY     carpel tunnel Bilateral 12/17/2009   COLONOSCOPY     ESOPHAGOGASTRODUODENOSCOPY     TUBAL LIGATION     Family History  Problem Relation Age of Onset   Anxiety disorder Mother    ADD / ADHD Brother    Alcohol abuse Brother        x 3   Drug abuse Brother    Anxiety disorder Maternal Aunt    Anxiety disorder Maternal Grandmother    Bipolar disorder Other    Emphysema Father    Diabetes  Sister    Hyperlipidemia Sister    Cirrhosis Sister    Hyperlipidemia Sister    Diabetes Sister    Diabetes Maternal Uncle    Dementia Neg Hx    OCD Neg Hx    Paranoid behavior Neg Hx    Physical abuse Neg Hx    Schizophrenia Neg Hx    Seizures Neg Hx    Sexual abuse Neg Hx    Colon cancer Neg Hx    Colon polyps Neg Hx    Pancreatic cancer Neg Hx    Esophageal cancer Neg Hx    Stomach cancer Neg Hx    Kidney disease Neg Hx    Liver disease Neg Hx    Rectal cancer Neg Hx    Social History   Socioeconomic History   Marital status: Married    Spouse name: Louie Casa   Number of children: 2   Years of education: Not on file   Highest education level: Not on file  Occupational History   Not on file  Tobacco Use   Smoking status: Former    Years: 30.00    Types: Cigarettes    Quit date: 09/03/1997    Years since quitting: 23.9   Smokeless tobacco: Current    Types: Snuff   Tobacco comments:    call when ready to quit  Vaping Use   Vaping Use: Never used  Substance and Sexual Activity   Alcohol use: No    Alcohol/week: 0.0 standard drinks   Drug use: No   Sexual activity: Not on file  Other Topics Concern   Not on file  Social History Narrative   Married 2 sons 1 lives in [place] 1 lives in [place]   She is a housewife   No alcohol tobacco or drug use at this time   Former smoker quit in [place] Determinants of Health   Financial Resource Strain: Not on file  Food Insecurity: Not on file  Transportation Needs: Not on file  Physical Activity: Not on file  Stress: Not on file  Social Connections: Not on file    Tobacco Counseling Ready to quit: Not Answered Counseling given: Not Answered Tobacco comments: call when ready to quit   Clinical Intake:                 Diabetic?Nutrition Risk Assessment:  Has the patient had any N/V/D within the last 2 months?  No  Does the patient have any non-healing wounds?  No  Has the patient  had any unintentional weight loss or weight  gain?  No   Diabetes:  Is the patient diabetic?  Yes  If diabetic, was a CBG obtained today?  No  Did the patient bring in their glucometer from home?  No  How often do you monitor your CBG's? Once a day.   Financial Strains and Diabetes Management:  Are you having any financial strains with the device, your supplies or your medication? No .  Does the patient want to be seen by Chronic Care Management for management of their diabetes?  No  Would the patient like to be referred to a Nutritionist or for Diabetic Management?  No   Diabetic Exams:  Diabetic Eye Exam: Has an appointment next week. Overdue for diabetic eye exam. Pt has been advised about the importance in completing this exam. A referral has been placed today. Message sent to referral coordinator for scheduling purposes. Advised pt to expect a call from office referred to regarding appt.  Diabetic Foot Exam: Completed 12-26-17. Pt has been advised about the importance in completing this exam. Pt is scheduled for diabetic foot exam on Next office visit.           Activities of Daily Living No flowsheet data found.  Patient Care Team: Lindell Spar, MD as PCP - General (Internal Medicine) Croitoru, Dani Gobble, MD as PCP - Cardiology (Cardiology)  Indicate any recent Medical Services you may have received from other than [facility] providers in the past year (date may be approximate).     Assessment:   This is a routine wellness examination for Kyri.  Hearing/Vision screen No results found.  Dietary issues and exercise activities discussed:     Goals Addressed   None   Depression Screen PHQ 2/9 Scores 05/31/2021 02/03/2020 08/06/2018 04/23/2018 01/27/2018 01/27/2018 10/23/2017  PHQ - 2 Score 1 1 0 0 0 0 0  PHQ- 9 Score - 11 - - - - -    Fall Risk Fall Risk  05/31/2021 02/03/2020 08/06/2018 04/23/2018 01/27/2018  Falls in the past year? 1 1 No No No  Number falls in past yr: 0 0 - -  -  Injury with Fall? 0 0 - - -  Risk for fall due to : No Fall Risks - - - -  Follow up Falls evaluation completed - - - -    FALL RISK PREVENTION PERTAINING TO THE HOME:  Any stairs in or around the home? Yes  If so, are there any without handrails? No  Home free of loose throw rugs in walkways, pet beds, electrical cords, etc? Yes  Adequate lighting in your home to reduce risk of falls? Yes   ASSISTIVE DEVICES UTILIZED TO PREVENT FALLS:  Life alert? No  Use of a cane, walker or w/c? No  Grab bars in the bathroom? Yes  Shower chair or bench in shower? Yes  Elevated toilet seat or a handicapped toilet? No   TIMED UP AND GO:  Was the test performed? No .  Length of time to ambulate 10 feet: NA sec.     Cognitive Function:        Immunizations Immunization History  Administered Date(s) Administered   Influenza,inj,Quad PF,6+ Mos 09/16/2014, 09/25/2017   Influenza-Unspecified 10/10/2011, 10/16/2013, 09/22/2015   Moderna Sars-Covid-2 Vaccination 02/18/2020, 03/25/2020   Pneumococcal Polysaccharide-23 02/18/2018    TDAP status: Due, Education has been provided regarding the importance of this vaccine. Advised may receive this vaccine at local pharmacy or Health Dept. Aware to provide a copy of the vaccination record if  obtained from local pharmacy or Health Dept. Verbalized acceptance and understanding.  Flu Vaccine status: Due, Education has been provided regarding the importance of this vaccine. Advised may receive this vaccine at local pharmacy or Health Dept. Aware to provide a copy of the vaccination record if obtained from local pharmacy or Health Dept. Verbalized acceptance and understanding.  Pneumococcal vaccine status: Due, Education has been provided regarding the importance of this vaccine. Advised may receive this vaccine at local pharmacy or Health Dept. Aware to provide a copy of the vaccination record if obtained from local pharmacy or Health Dept. Verbalized  acceptance and understanding.  Covid-19 vaccine status: Completed vaccines  Qualifies for Shingles Vaccine? Yes   Zostavax completed No   Shingrix Completed?: No.    Education has been provided regarding the importance of this vaccine. Patient has been advised to call insurance company to determine out of pocket expense if they have not yet received this vaccine. Advised may also receive vaccine at local pharmacy or Health Dept. Verbalized acceptance and understanding.  Screening Tests Health Maintenance  Topic Date Due   OPHTHALMOLOGY EXAM  Never done   DEXA SCAN  Never done   FOOT EXAM  12/26/2018   MAMMOGRAM  09/26/2019   COVID-19 Vaccine (3 - Moderna risk series) 04/22/2020   INFLUENZA VACCINE  07/17/2021   Zoster Vaccines- Shingrix (1 of 2) 08/31/2021 (Originally 09/24/1970)   TETANUS/TDAP  05/31/2022 (Originally 09/24/1970)   PNA vac Low Risk Adult (1 of 2 - PCV13) 05/31/2022 (Originally 02/19/2019)   HEMOGLOBIN A1C  11/30/2021   COLONOSCOPY (Pts 45-32yr Insurance coverage will need to be confirmed)  03/03/2024   Hepatitis C Screening  Completed   HPV VACCINES  Aged Out    Health Maintenance  Health Maintenance Due  Topic Date Due   OPHTHALMOLOGY EXAM  Never done   DEXA SCAN  Never done   FOOT EXAM  12/26/2018   MAMMOGRAM  09/26/2019   COVID-19 Vaccine (3 - Moderna risk series) 04/22/2020   INFLUENZA VACCINE  07/17/2021    Colorectal cancer screening: Type of screening: Colonoscopy. Completed 03-04-19. Repeat every 5 years  Mammogram status: Ordered 08-19-21. Pt provided with contact info and advised to call to schedule appt.   Bone Density: Patient declined  Lung Cancer Screening: (Low Dose CT Chest recommended if Age 68-80years, 30 pack-year currently smoking OR have quit w/in 15years.) does not qualify.   Lung Cancer Screening Referral: NA  Additional Screening:  Hepatitis C Screening: does qualify; Completed 02-09-21  Vision Screening: Recommended annual  ophthalmology exams for early detection of glaucoma and other disorders of the eye. Is the patient up to date with their annual eye exam?  No  Who is the provider or what is the name of the office in which the patient attends annual eye exams? [facility] If pt is not established with a provider, would they like to be referred to a provider to establish care? No .   Dental Screening: Recommended annual dental exams for proper oral hygiene  Community Resource Referral / Chronic Care Management: CRR required this visit?  No   CCM required this visit?  No      Plan:     I have personally reviewed and noted the following in the patient's chart:   Medical and social history Use of alcohol, tobacco or illicit drugs  Current medications and supplements including opioid prescriptions.  Functional ability and status Nutritional status Physical activity Advanced directives List  of other physicians Hospitalizations, surgeries, and ER visits in previous 12 months Vitals Screenings to include cognitive, depression, and falls Referrals and appointments  In addition, I have reviewed and discussed with patient certain preventive protocols, quality metrics, and best practice recommendations. A written personalized care plan for preventive services as well as general preventive health recommendations were provided to patient.     Shelda Altes, CMA   08/19/2021   Nurse Notes: This was a telehealth visit. Patient was at home. Provider was at home and Ihor Dow, MD.

## 2021-08-19 NOTE — Patient Instructions (Signed)
[name] [name] , Thank you for taking time to come for your Medicare Wellness Visit. I appreciate your ongoing commitment to your health goals. Please review the following plan we discussed and let me know if I can assist you in the future.   Screening recommendations/referrals: Colonoscopy: Due 03-03-24 Mammogram: Ordered for patient Bone Density: Patient declined Recommended yearly ophthalmology/optometry visit for glaucoma screening and checkup Recommended yearly dental visit for hygiene and checkup  Vaccinations: Influenza vaccine: Due now can get done in office Pneumococcal vaccine: Due now  Tdap vaccine: Due now  Shingles vaccine: Due now    Advanced directives: Declined information  Conditions/risks identified: Hypertension, Diabetes  Next appointment: 1 year    Preventive Care 4 Years and Older, Female Preventive care refers to lifestyle choices and visits with your health care provider that can promote health and wellness. What does preventive care include? A yearly physical exam. This is also called an annual well check. Dental exams once or twice a year. Routine eye exams. Ask your health care provider how often you should have your eyes checked. Personal lifestyle choices, including: Daily care of your teeth and gums. Regular physical activity. Eating a healthy diet. Avoiding tobacco and drug use. Limiting alcohol use. Practicing safe sex. Taking low-dose aspirin every day. Taking vitamin and mineral supplements as recommended by your health care provider. What happens during an annual well check? The services and screenings done by your health care provider during your annual well check will depend on your age, overall health, lifestyle risk factors, and family history of disease. Counseling  Your health care provider may ask you questions about your: Alcohol use. Tobacco use. Drug use. Emotional well-being. Home and relationship well-being. Sexual  activity. Eating habits. History of falls. Memory and ability to understand (cognition). Work and work Statistician. Reproductive health. Screening  You may have the following tests or measurements: Height, weight, and BMI. Blood pressure. Lipid and cholesterol levels. These may be checked every 5 years, or more frequently if you are over 12 years old. Skin check. Lung cancer screening. You may have this screening every year starting at age 53 if you have a 30-pack-year history of smoking and currently smoke or have quit within the past 15 years. Fecal occult blood test (FOBT) of the stool. You may have this test every year starting at age 73. Flexible sigmoidoscopy or colonoscopy. You may have a sigmoidoscopy every 5 years or a colonoscopy every 10 years starting at age 36. Hepatitis C blood test. Hepatitis B blood test. Sexually transmitted disease (STD) testing. Diabetes screening. This is done by checking your blood sugar (glucose) after you have not eaten for a while (fasting). You may have this done every 1-3 years. Bone density scan. This is done to screen for osteoporosis. You may have this done starting at age 75. Mammogram. This may be done every 1-2 years. Talk to your health care provider about how often you should have regular mammograms. Talk with your health care provider about your test results, treatment options, and if necessary, the need for more tests. Vaccines  Your health care provider may recommend certain vaccines, such as: Influenza vaccine. This is recommended every year. Tetanus, diphtheria, and acellular pertussis (Tdap, Td) vaccine. You may need a Td booster every 10 years. Zoster vaccine. You may need this after age 60. Pneumococcal 13-valent conjugate (PCV13) vaccine. One dose is recommended after age 69. Pneumococcal polysaccharide (PPSV23) vaccine. One dose is recommended after age 8. Talk to your health  care provider about which screenings and vaccines  you need and how often you need them. This information is not intended to replace advice given to you by your health care provider. Make sure you discuss any questions you have with your health care provider. Document Released: 12/30/2015 Document Revised: 08/22/2016 Document Reviewed: 10/04/2015 Elsevier Interactive Patient Education  2017 [employer] Prevention in the Home Falls can cause injuries. They can happen to people of all ages. There are many things you can do to make your home safe and to help prevent falls. What can I do on the outside of my home? Regularly fix the edges of walkways and driveways and fix any cracks. Remove anything that might make you trip as you walk through a door, such as a raised step or threshold. Trim any bushes or trees on the path to your home. Use bright outdoor lighting. Clear any walking paths of anything that might make someone trip, such as rocks or tools. Regularly check to see if handrails are loose or broken. Make sure that both sides of any steps have handrails. Any raised decks and porches should have guardrails on the edges. Have any leaves, snow, or ice cleared regularly. Use sand or salt on walking paths during winter. Clean up any spills in your garage right away. This includes oil or grease spills. What can I do in the bathroom? Use night lights. Install grab bars by the toilet and in the tub and shower. Do not use towel bars as grab bars. Use non-skid mats or decals in the tub or shower. If you need to sit down in the shower, use a plastic, non-slip stool. Keep the floor dry. Clean up any water that spills on the floor as soon as it happens. Remove soap buildup in the tub or shower regularly. Attach bath mats securely with double-sided non-slip rug tape. Do not have throw rugs and other things on the floor that can make you trip. What can I do in the bedroom? Use night lights. Make sure that you have a light by your bed that  is easy to reach. Do not use any sheets or blankets that are too big for your bed. They should not hang down onto the floor. Have a firm chair that has side arms. You can use this for support while you get dressed. Do not have throw rugs and other things on the floor that can make you trip. What can I do in the kitchen? Clean up any spills right away. Avoid walking on wet floors. Keep items that you use a lot in easy-to-reach places. If you need to reach something above you, use a strong step stool that has a grab bar. Keep electrical cords out of the way. Do not use floor polish or wax that makes floors slippery. If you must use wax, use non-skid floor wax. Do not have throw rugs and other things on the floor that can make you trip. What can I do with my stairs? Do not leave any items on the stairs. Make sure that there are handrails on both sides of the stairs and use them. Fix handrails that are broken or loose. Make sure that handrails are as long as the stairways. Check any carpeting to make sure that it is firmly attached to the stairs. Fix any carpet that is loose or worn. Avoid having throw rugs at the top or bottom of the stairs. If you do have throw rugs, attach them to  the floor with carpet tape. Make sure that you have a light switch at the top of the stairs and the bottom of the stairs. If you do not have them, ask someone to add them for you. What else can I do to help prevent falls? Wear shoes that: Do not have high heels. Have rubber bottoms. Are comfortable and fit you well. Are closed at the toe. Do not wear sandals. If you use a stepladder: Make sure that it is fully opened. Do not climb a closed stepladder. Make sure that both sides of the stepladder are locked into place. Ask someone to hold it for you, if possible. Clearly mark and make sure that you can see: Any grab bars or handrails. First and last steps. Where the edge of each step is. Use tools that help you  move around (mobility aids) if they are needed. These include: Canes. Walkers. Scooters. Crutches. Turn on the lights when you go into a dark area. Replace any light bulbs as soon as they burn out. Set up your furniture so you have a clear path. Avoid moving your furniture around. If any of your floors are uneven, fix them. If there are any pets around you, be aware of where they are. Review your medicines with your doctor. Some medicines can make you feel dizzy. This can increase your chance of falling. Ask your doctor what other things that you can do to help prevent falls. This information is not intended to replace advice given to you by your health care provider. Make sure you discuss any questions you have with your health care provider. Document Released: 09/29/2009 Document Revised: 05/10/2016 Document Reviewed: 01/07/2015 Elsevier Interactive Patient Education  2017 [employer].

## 2021-08-20 ENCOUNTER — Other Ambulatory Visit: Payer: Self-pay | Admitting: Cardiovascular Disease

## 2021-08-30 ENCOUNTER — Ambulatory Visit (HOSPITAL_COMMUNITY): Payer: Medicare HMO

## 2021-08-31 ENCOUNTER — Encounter: Payer: Self-pay | Admitting: Internal Medicine

## 2021-08-31 ENCOUNTER — Ambulatory Visit (INDEPENDENT_AMBULATORY_CARE_PROVIDER_SITE_OTHER): Payer: Medicare HMO | Admitting: Internal Medicine

## 2021-08-31 ENCOUNTER — Other Ambulatory Visit: Payer: Self-pay

## 2021-08-31 VITALS — BP 144/72 | HR 56 | Temp 97.4°F | Resp 18 | Ht 63.0 in | Wt 136.1 lb

## 2021-08-31 DIAGNOSIS — IMO0002 Reserved for concepts with insufficient information to code with codable children: Secondary | ICD-10-CM

## 2021-08-31 DIAGNOSIS — R232 Flushing: Secondary | ICD-10-CM | POA: Diagnosis not present

## 2021-08-31 DIAGNOSIS — E1165 Type 2 diabetes mellitus with hyperglycemia: Secondary | ICD-10-CM

## 2021-08-31 DIAGNOSIS — F329 Major depressive disorder, single episode, unspecified: Secondary | ICD-10-CM

## 2021-08-31 DIAGNOSIS — E782 Mixed hyperlipidemia: Secondary | ICD-10-CM

## 2021-08-31 DIAGNOSIS — E118 Type 2 diabetes mellitus with unspecified complications: Secondary | ICD-10-CM

## 2021-08-31 DIAGNOSIS — F419 Anxiety disorder, unspecified: Secondary | ICD-10-CM

## 2021-08-31 DIAGNOSIS — Z23 Encounter for immunization: Secondary | ICD-10-CM

## 2021-08-31 DIAGNOSIS — I10 Essential (primary) hypertension: Secondary | ICD-10-CM

## 2021-08-31 MED ORDER — VENLAFAXINE HCL 37.5 MG PO TABS: 37.5000 mg | ORAL_TABLET | Freq: Two times a day (BID) | ORAL | 5 refills | Status: DC

## 2021-08-31 NOTE — Assessment & Plan Note (Signed)
On Crestor Check lipid profile 

## 2021-08-31 NOTE — Assessment & Plan Note (Signed)
BP Readings from Last 1 Encounters:  08/31/21 (!) 144/72   uncontrolled with Lisinopril, and Atenolol She has stopped taking spironolactone Closely followed by Cardiology Counseled for compliance with the medications Advised DASH diet and moderate exercise/walking, at least 150 mins/week

## 2021-08-31 NOTE — Patient Instructions (Addendum)
Please take Effexor once daily for 1 week and then twice daily from next week.  Please continue to take medications as prescribed.  Please continue to follow low carb diet and ambulate as tolerated.  Please get fasting blood tests done within a week.

## 2021-08-31 NOTE — Assessment & Plan Note (Addendum)
HbA1C: 7.9 On Toujeo 60 mg qHS and Glipizide 10 mg QD If persistently elevated, plan to add SGLT inhibitor Did not tolerate Metformin in the past Advised to follow diabetic diet On ACEi and statin F/u CMP and lipid panel Diabetic eye exam: Advised to follow up with Ophthalmology for diabetic eye exam

## 2021-08-31 NOTE — Assessment & Plan Note (Signed)
Did not tolerate Zoloft - drowsiness and diarrhea Cymbalta was ineffective Did not like effect of Lexapro Started Effexor 37.5 mg twice daily for anxiety, depression and hot flashes

## 2021-08-31 NOTE — Progress Notes (Signed)
Established Patient Office Visit  Subjective:  Patient ID: [name] [name], female    DOB: November 11, 1951  Age: 70 y.o. MRN: [id number]  CC:  Chief Complaint  Patient presents with   Follow-up    6 wk follow up blood pressure lexapro made her angry and didn't work also cardiologist put her on spironolactone and she cant take this either has appt with cardio next week     HPI [name] [name] is a 70 year old female with PMH of NASH related liver cirrhosis, portal hypertensive gastropathy, esophageal varices, HTN, AS, DM, HLD, MGUS, iron deficiency anemia, depression and anxiety who presents for f/u of her chronic medical conditions.  HTN: BP was elevated in the office today. She has been taking Lisinopril, Atenolol and recently stopped Spironolactone. Denies any headache, dizziness, chest pain, dyspnea or palpitations. She also has h/o AS and is closely followed by Cardiologist - has an appointment in the next week.  DM: HbA1C was 7.9, which is better compared to prior. She is on Toujeo and Glipizide. She admits that she can improve on her diet. Denies polyuria or polydipsia.  Depression with anxiety: She did not tolerate Zoloft and Cymbalta in the past.  She was placed on Lexapro in the last visit, but she was feeling agitated with it.  Denies any SI or HI.  Hot flashes: She complains of having chronic hot flashes.  She is willing to try Effexor for anxiety and hot flashes.  She received flu vaccine in the office today.  Past Medical History:  Diagnosis Date   Allergy    Anemia, unspecified    Anxiety    Arthritis    Chronic diarrhea    Cirrhosis (HCC)    Depression    Depression    Elevated cholesterol    Enterocolitis due to Clostridium difficile, not specified as recurrent    Gastro-esophageal reflux disease with esophagitis    Helicobacter pylori gastritis 07/10/2017   Dx EGD -  1) Omeprazole 20 mg 2 times a day x 14 d 2) Pepto Bismol 2 tabs (262 mg each) 4 times a day x 14  d 3) Metronidazole 250 mg 4 times a day x 14 d 4) doxycycline 100 mg 2 times a day x 14 d  After 14 d stop omeprazole also  In 4 weeks after treatment completed do H. Pylori stool antigen - dx H. Pylori gastritis    Hx of adenomatous colonic polyps 10/05/2015   Hyperlipemia 02/21/09   NUC STRESS TEST-NORMAL- EF 74%   Iron deficiency anemia    Irritable bowel syndrome    Localized edema    Major depressive disorder, single episode, unspecified    MVP (mitral valve prolapse)    Rectocele    Thrombocytopenia (HCC)    Type 2 diabetes mellitus with hyperglycemia ([place])     Past Surgical History:  Procedure Laterality Date   ABDOMINAL HYSTERECTOMY     BILATERAL SALPINGOOPHORECTOMY     carpel tunnel Bilateral 12/17/2009   COLONOSCOPY     ESOPHAGOGASTRODUODENOSCOPY     TUBAL LIGATION      Family History  Problem Relation Age of Onset   Anxiety disorder Mother    ADD / ADHD Brother    Alcohol abuse Brother        x 3   Drug abuse Brother    Anxiety disorder Maternal Aunt    Anxiety disorder Maternal Grandmother    Bipolar disorder Other    Emphysema Father    Diabetes  Sister    Hyperlipidemia Sister    Cirrhosis Sister    Hyperlipidemia Sister    Diabetes Sister    Diabetes Maternal Uncle    Dementia Neg Hx    OCD Neg Hx    Paranoid behavior Neg Hx    Physical abuse Neg Hx    Schizophrenia Neg Hx    Seizures Neg Hx    Sexual abuse Neg Hx    Colon cancer Neg Hx    Colon polyps Neg Hx    Pancreatic cancer Neg Hx    Esophageal cancer Neg Hx    Stomach cancer Neg Hx    Kidney disease Neg Hx    Liver disease Neg Hx    Rectal cancer Neg Hx     Social History   Socioeconomic History   Marital status: Married    Spouse name: Louie Casa   Number of children: 2   Years of education: Not on file   Highest education level: Not on file  Occupational History   Not on file  Tobacco Use   Smoking status: Former    Years: 30.00    Types: Cigarettes    Quit date: 09/03/1997     Years since quitting: 24.0   Smokeless tobacco: Current    Types: Snuff   Tobacco comments:    call when ready to quit  Vaping Use   Vaping Use: Never used  Substance and Sexual Activity   Alcohol use: No    Alcohol/week: 0.0 standard drinks   Drug use: No   Sexual activity: Not on file  Other Topics Concern   Not on file  Social History Narrative   Married 2 sons 1 lives in [place] 1 lives in [place]   She is a housewife   No alcohol tobacco or drug use at this time   Former smoker quit in [place] Resource Strain: Low Risk    Difficulty of Paying Living Expenses: Not hard at all  Food Insecurity: No Food Insecurity   Worried About Charity fundraiser in the Last Year: Never true   Arboriculturist in the Last Year: Never true  Transportation Needs: No Transportation Needs   Lack of Transportation (Medical): No   Lack of Transportation (Non-Medical): No  Physical Activity: Sufficiently Active   Days of Exercise per Week: 5 days   Minutes of Exercise per Session: 30 min  Stress: No Stress Concern Present   Feeling of Stress : Not at all  Social Connections: Moderately Integrated   Frequency of Communication with Friends and Family: More than three times a week   Frequency of Social Gatherings with Friends and Family: More than three times a week   Attends Religious Services: More than 4 times per year   Active Member of [employer] or Organizations: No   Attends Archivist Meetings: Never   Marital Status: Married  Human resources officer Violence: Not At Risk   Fear of Current or Ex-Partner: No   Emotionally Abused: No   Physically Abused: No   Sexually Abused: No    Outpatient Medications Prior to Visit  Medication Sig Dispense Refill   atenolol (TENORMIN) 50 MG tablet TAKE 1 TABLET BY MOUTH TWICE A DAY 120 tablet 0   BIOTIN PO Take 500 mg by mouth daily.      ferrous sulfate 325 (65 FE) MG tablet Take 325 mg by  mouth daily with breakfast.  glipiZIDE (GLUCOTROL) 10 MG tablet Take 10 mg by mouth daily before breakfast. 1 Tablet Daily Before Breakfast     Insulin Pen Needle 32G X 4 MM MISC 1 EACH BY DOES NOT APPLY ROUTE 4 (FOUR) TIMES DAILY. 150 each 5   lisinopril (ZESTRIL) 10 MG tablet Take 1 tablet (10 mg total) by mouth daily. 30 tablet 3   rosuvastatin (CRESTOR) 5 MG tablet Take 5 mg by mouth daily. Takes one on Monday, Wed., and Friday     triamcinolone cream (KENALOG) 0.1 % Apply 1 application topically 2 (two) times daily. 30 g 0   insulin glargine, 1 Unit Dial, (TOUJEO SOLOSTAR) 300 UNIT/ML Solostar Pen Inject 60 Units into the skin 1 day or 1 dose for 1 dose. 6 mL 2   spironolactone (ALDACTONE) 25 MG tablet TAKE 1 TABLET (25 MG TOTAL) BY MOUTH DAILY. (Patient not taking: Reported on 08/31/2021) 90 tablet 0   escitalopram (LEXAPRO) 5 MG tablet TAKE 1 TABLET (5 MG TOTAL) BY MOUTH DAILY. (Patient not taking: Reported on 08/31/2021) 90 tablet 1   Facility-Administered Medications Prior to Visit  Medication Dose Route Frequency Provider Last Rate Last Admin   0.9 %  sodium chloride infusion  500 mL Intravenous Once Gatha Mayer, MD        Allergies  Allergen Reactions   Codeine Other (See Comments)    Took it years ago and doesn't remember the reaction    ROS Review of Systems  Constitutional:  Negative for chills and fever.  HENT:  Negative for congestion, sinus pressure, sinus pain and sore throat.   Eyes:  Negative for pain and discharge.  Respiratory:  Negative for cough and shortness of breath.   Cardiovascular:  Negative for chest pain and palpitations.  Gastrointestinal:  Negative for abdominal pain, constipation, diarrhea, nausea and vomiting.  Endocrine: Negative for polydipsia and polyuria.  Genitourinary:  Negative for dysuria and hematuria.  Musculoskeletal:  Negative for neck pain and neck stiffness.  Skin:  Positive for rash.  Neurological:  Negative for dizziness and  weakness.  Psychiatric/Behavioral:  Positive for dysphoric mood and sleep disturbance. Negative for agitation and behavioral problems. The patient is nervous/anxious.      Objective:    Physical Exam Vitals reviewed.  Constitutional:      General: She is not in acute distress.    Appearance: She is not diaphoretic.  HENT:     Head: Normocephalic and atraumatic.     Nose: Nose normal.     Mouth/Throat:     Mouth: Mucous membranes are moist.  Eyes:     General: No scleral icterus.    Extraocular Movements: Extraocular movements intact.  Cardiovascular:     Rate and Rhythm: Normal rate and regular rhythm.     Pulses: Normal pulses.     Heart sounds: Murmur (Systolic over left upper sternal border) heard.  Pulmonary:     Breath sounds: Normal breath sounds. No wheezing or rales.  Abdominal:     Palpations: Abdomen is soft.     Tenderness: There is no abdominal tenderness.  Musculoskeletal:     Cervical back: Neck supple. No tenderness.     Right lower leg: No edema.     Left lower leg: No edema.  Skin:    General: Skin is warm.     Findings: Rash (about 1 cm diameter erythema with central scab over left foot) present.  Neurological:     General: No focal deficit present.  Mental Status: She is alert and oriented to person, place, and time.  Psychiatric:        Mood and Affect: Mood normal.        Behavior: Behavior normal.    BP (!) 144/72 (BP Location: Left Arm, Cuff Size: Normal)   Pulse (!) 56   Temp (!) 97.4 F (36.3 C) (Oral)   Resp 18   Ht _0  (1.6 m)   Wt 136 lb 1.3 oz (61.7 kg)   SpO2 99%   BMI 24.11 kg/m  Wt Readings from Last 3 Encounters:  08/31/21 136 lb 1.3 oz (61.7 kg)  05/31/21 146 lb (66.2 kg)  05/25/21 147 lb (66.7 kg)     Health Maintenance Due  Topic Date Due   OPHTHALMOLOGY EXAM  Never done   DEXA SCAN  Never done   FOOT EXAM  12/26/2018   MAMMOGRAM  09/26/2019   COVID-19 Vaccine (3 - Moderna risk series) 04/22/2020    There  are no preventive care reminders to display for this patient.  Lab Results  Component Value Date   TSH 2.13 02/09/2020   Lab Results  Component Value Date   WBC 4.3 03/01/2020   HGB 12.4 03/01/2020   HCT 38.7 03/01/2020   MCV 88.6 03/01/2020   PLT 63 (L) 03/01/2020   Lab Results  Component Value Date   NA 142 02/09/2020   K 3.8 02/09/2020   CO2 26 02/09/2020   GLUCOSE 93 02/09/2020   BUN 10 02/09/2020   CREATININE 0.78 02/09/2020   BILITOT 0.8 02/09/2020   ALKPHOS 52 09/13/2014   AST 32 02/09/2020   ALT 21 02/09/2020   PROT 7.0 02/09/2020   ALBUMIN 4.2 09/13/2014   CALCIUM 9.3 02/09/2020   Lab Results  Component Value Date   CHOL 168 12/02/2018   Lab Results  Component Value Date   HDL 55 12/02/2018   Lab Results  Component Value Date   LDLCALC 95 12/02/2018   Lab Results  Component Value Date   TRIG 87 12/02/2018   Lab Results  Component Value Date   CHOLHDL 2.9 04/16/2018   Lab Results  Component Value Date   HGBA1C 7.9 (A) 05/31/2021      Assessment & Plan:   Problem List Items Addressed This Visit       Cardiovascular and Mediastinum   Essential hypertension, benign - Primary    BP Readings from Last 1 Encounters:  08/31/21 (!) 144/72  uncontrolled with Lisinopril, and Atenolol She has stopped taking spironolactone Closely followed by Cardiology Counseled for compliance with the medications Advised DASH diet and moderate exercise/walking, at least 150 mins/week       Relevant Orders   CBC with Differential/Platelet     Endocrine   Uncontrolled type 2 diabetes mellitus with complication, without long-term current use of insulin (HCC)    HbA1C: 7.9 On Toujeo 60 mg qHS and Glipizide 10 mg QD If persistently elevated, plan to add SGLT inhibitor Did not tolerate Metformin in the past Advised to follow diabetic diet On ACEi and statin F/u CMP and lipid panel Diabetic eye exam: Advised to follow up with Ophthalmology for diabetic eye  exam         Other   Mixed hyperlipidemia    On Crestor Check lipid profile      Relevant Orders   Lipid panel   Major depressive disorder, single episode, unspecified    Did not tolerate Zoloft - drowsiness and diarrhea Cymbalta was ineffective  Did not like effect of Lexapro Started Effexor 37.5 mg twice daily for anxiety, depression and hot flashes      Relevant Medications   venlafaxine (EFFEXOR) 37.5 MG tablet   Other Relevant Orders   CBC with Differential/Platelet   Other Visit Diagnoses     Type 2 diabetes mellitus with hyperglycemia, unspecified whether long term insulin use (HCC)       Relevant Orders   CMP14+EGFR   Hemoglobin A1c   Urinalysis   Anxiety       Relevant Medications   venlafaxine (EFFEXOR) 37.5 MG tablet   Need for immunization against influenza       Relevant Orders   Flu Vaccine QUAD High Dose(Fluad) (Completed)   Hot flashes       Relevant Medications   venlafaxine (EFFEXOR) 37.5 MG tablet       Meds ordered this encounter  Medications   venlafaxine (EFFEXOR) 37.5 MG tablet    Sig: Take 1 tablet (37.5 mg total) by mouth 2 (two) times daily.    Dispense:  60 tablet    Refill:  5    Follow-up: Return in about 4 months (around 12/31/2021).    Lindell Spar, MD

## 2021-09-04 DIAGNOSIS — Z0100 Encounter for examination of eyes and vision without abnormal findings: Secondary | ICD-10-CM | POA: Diagnosis not present

## 2021-09-04 DIAGNOSIS — H5200 Hypermetropia, unspecified eye: Secondary | ICD-10-CM | POA: Diagnosis not present

## 2021-09-05 DIAGNOSIS — E782 Mixed hyperlipidemia: Secondary | ICD-10-CM | POA: Diagnosis not present

## 2021-09-05 DIAGNOSIS — F329 Major depressive disorder, single episode, unspecified: Secondary | ICD-10-CM | POA: Diagnosis not present

## 2021-09-05 DIAGNOSIS — E1165 Type 2 diabetes mellitus with hyperglycemia: Secondary | ICD-10-CM | POA: Diagnosis not present

## 2021-09-05 DIAGNOSIS — I10 Essential (primary) hypertension: Secondary | ICD-10-CM | POA: Diagnosis not present

## 2021-09-06 ENCOUNTER — Telehealth: Payer: Self-pay

## 2021-09-06 ENCOUNTER — Other Ambulatory Visit: Payer: Self-pay | Admitting: Internal Medicine

## 2021-09-06 DIAGNOSIS — E782 Mixed hyperlipidemia: Secondary | ICD-10-CM

## 2021-09-06 LAB — URINALYSIS
Bilirubin, UA: NEGATIVE
Glucose, UA: NEGATIVE
Ketones, UA: NEGATIVE
Nitrite, UA: NEGATIVE
RBC, UA: NEGATIVE
Specific Gravity, UA: 1.021 (ref 1.005–1.030)
Urobilinogen, Ur: 1 mg/dL (ref 0.2–1.0)
pH, UA: 6 (ref 5.0–7.5)

## 2021-09-06 LAB — HEMOGLOBIN A1C
Est. average glucose Bld gHb Est-mCnc: 169 mg/dL
Hgb A1c MFr Bld: 7.5 % — ABNORMAL HIGH (ref 4.8–5.6)

## 2021-09-06 LAB — LIPID PANEL
Chol/HDL Ratio: 4 ratio (ref 0.0–4.4)
Cholesterol, Total: 214 mg/dL — ABNORMAL HIGH (ref 100–199)
HDL: 54 mg/dL (ref >39)
LDL Chol Calc (NIH): 143 mg/dL — ABNORMAL HIGH (ref 0–99)
Triglycerides: 93 mg/dL (ref 0–149)
VLDL Cholesterol Cal: 17 mg/dL (ref 5–40)

## 2021-09-06 LAB — CMP14+EGFR
ALT: 18 IU/L (ref 0–32)
AST: 29 IU/L (ref 0–40)
Albumin/Globulin Ratio: 1.3 (ref 1.2–2.2)
Albumin: 3.9 g/dL (ref 3.8–4.8)
Alkaline Phosphatase: 47 IU/L (ref 44–121)
BUN/Creatinine Ratio: 17 (ref 12–28)
BUN: 14 mg/dL (ref 8–27)
Bilirubin Total: 0.8 mg/dL (ref 0.0–1.2)
CO2: 24 mmol/L (ref 20–29)
Calcium: 8.9 mg/dL (ref 8.7–10.3)
Chloride: 105 mmol/L (ref 96–106)
Creatinine, Ser: 0.84 mg/dL (ref 0.57–1.00)
Globulin, Total: 2.9 g/dL (ref 1.5–4.5)
Glucose: 98 mg/dL (ref 65–99)
Potassium: 4.1 mmol/L (ref 3.5–5.2)
Sodium: 142 mmol/L (ref 134–144)
Total Protein: 6.8 g/dL (ref 6.0–8.5)
eGFR: 76 mL/min/{1.73_m2} (ref >59)

## 2021-09-06 LAB — CBC WITH DIFFERENTIAL/PLATELET
Basophils Absolute: 0 10*3/uL (ref 0.0–0.2)
Basos: 1 %
EOS (ABSOLUTE): 0.1 10*3/uL (ref 0.0–0.4)
Eos: 2 %
Hematocrit: 36.5 % (ref 34.0–46.6)
Hemoglobin: 12.5 g/dL (ref 11.1–15.9)
Immature Grans (Abs): 0 10*3/uL (ref 0.0–0.1)
Immature Granulocytes: 0 %
Lymphocytes Absolute: 1.1 10*3/uL (ref 0.7–3.1)
Lymphs: 21 %
MCH: 28.7 pg (ref 26.6–33.0)
MCHC: 34.2 g/dL (ref 31.5–35.7)
MCV: 84 fL (ref 79–97)
Monocytes Absolute: 0.4 10*3/uL (ref 0.1–0.9)
Monocytes: 8 %
Neutrophils Absolute: 3.5 10*3/uL (ref 1.4–7.0)
Neutrophils: 68 %
Platelets: 60 10*3/uL — CL (ref 150–450)
RBC: 4.35 x10E6/uL (ref 3.77–5.28)
RDW: 14.4 % (ref 11.7–15.4)
WBC: 5.1 10*3/uL (ref 3.4–10.8)

## 2021-09-06 MED ORDER — EZETIMIBE 10 MG PO TABS: 10.0000 mg | ORAL_TABLET | Freq: Every day | ORAL | 3 refills | Status: DC

## 2021-09-06 NOTE — Telephone Encounter (Signed)
Patient returning call about lab results.  

## 2021-09-06 NOTE — Progress Notes (Signed)
LVM for patient to call the office

## 2021-09-08 ENCOUNTER — Telehealth: Payer: Self-pay

## 2021-09-08 NOTE — Telephone Encounter (Signed)
Patient calling about lab results. Call back # [phone]

## 2021-09-11 NOTE — Telephone Encounter (Signed)
LVM for pt to call the office regarding labs

## 2021-09-12 ENCOUNTER — Telehealth: Payer: Self-pay | Admitting: *Deleted

## 2021-09-12 NOTE — Telephone Encounter (Signed)
Left a message for the patient to call back to reschedule her appointment with Dr. Sallyanne Kuster due to an emergency.

## 2021-09-13 ENCOUNTER — Telehealth: Payer: Self-pay

## 2021-09-13 ENCOUNTER — Ambulatory Visit: Payer: Medicare HMO | Admitting: Cardiovascular Disease

## 2021-09-13 NOTE — Telephone Encounter (Signed)
Patient lvm returning call about lab results.

## 2021-09-14 NOTE — Telephone Encounter (Signed)
Pt advised of lab results with verbal understanding  

## 2021-09-20 ENCOUNTER — Ambulatory Visit: Payer: Medicare HMO | Admitting: Cardiovascular Disease

## 2021-09-24 ENCOUNTER — Other Ambulatory Visit: Payer: Self-pay | Admitting: Internal Medicine

## 2021-09-24 DIAGNOSIS — F419 Anxiety disorder, unspecified: Secondary | ICD-10-CM

## 2021-09-24 DIAGNOSIS — R232 Flushing: Secondary | ICD-10-CM

## 2021-10-02 ENCOUNTER — Telehealth: Payer: Self-pay | Admitting: Cardiovascular Disease

## 2021-10-02 NOTE — Telephone Encounter (Signed)
Returned call to patient who states her BP has been elevated with systolic 695'Q since starting Effexor. Reports HR 50's bpm. She recently reported to PCP that she had stopped spironolactone but states she has resumed it. Takes it at night - not sure whether it contributes to frequent urination at night so I suggested she take it in the morning. Reports compliance with lisinopril and atenolol. She is monitoring BP inconsistently and has vague symptoms of "feeling terrible" and palpitations.  Reduced her dose of Effexor from 37.5 mg twice daily to 25 mg daily because she didn't like the way she has been feeling. According to PCP note, she has not been able to tolerate multiple other anti-depressants.  I advised her to follow a schedule of taking spironolactone and atenolol every morning and lisinopril and atenolol every evening, and to keep a diary this week with BP and HR readings along with any symptoms. Advised her to call back Friday to report. She verbalized understanding and agreement and thanked me for the call.

## 2021-10-02 NOTE — Telephone Encounter (Signed)
Patient c/o Palpitations:  High priority if patient c/o lightheadedness, shortness of breath, or chest pain  How long have you had palpitations/irregular HR/ Afib? Are you having the symptoms now? Years, but more the last month, no  Are you currently experiencing lightheadedness, SOB or CP? no  Do you have a history of afib (atrial fibrillation) or irregular heart rhythm? yes  Have you checked your BP or HR? (document readings if available): BP 160/69 this morning  Are you experiencing any other symptoms? Fatigue, had lightheadedness last night   Patient states she has had more palpitations in the last month. She says her BP has also been increasing. She says she has felt "really bad". She says she feels really tired and last night felt a little dizzy. She says her eyes were a little blurry last night, but not anymore. She says she does not have symptoms now. She says she is not sure if her medications need to be adjusted. She also states she is on a new antidepressant.

## 2021-10-06 ENCOUNTER — Encounter (INDEPENDENT_AMBULATORY_CARE_PROVIDER_SITE_OTHER): Payer: Self-pay

## 2021-10-06 ENCOUNTER — Encounter: Payer: Self-pay | Admitting: Family Medicine

## 2021-10-06 ENCOUNTER — Other Ambulatory Visit: Payer: Self-pay

## 2021-10-06 ENCOUNTER — Ambulatory Visit (INDEPENDENT_AMBULATORY_CARE_PROVIDER_SITE_OTHER): Payer: Medicare HMO | Admitting: Family Medicine

## 2021-10-06 VITALS — BP 186/72 | HR 58 | Temp 98.2°F | Ht 63.0 in | Wt 134.0 lb

## 2021-10-06 DIAGNOSIS — I10 Essential (primary) hypertension: Secondary | ICD-10-CM | POA: Diagnosis not present

## 2021-10-06 DIAGNOSIS — F329 Major depressive disorder, single episode, unspecified: Secondary | ICD-10-CM | POA: Diagnosis not present

## 2021-10-06 MED ORDER — LOSARTAN POTASSIUM 50 MG PO TABS: 50.0000 mg | ORAL_TABLET | Freq: Every day | ORAL | 0 refills | Status: DC

## 2021-10-06 NOTE — Telephone Encounter (Signed)
Patient calling back. She states her BP all week has been 200 on the top but the bottom number is not high. She states she is concerned and has also been coughing. She says she is also off her antidepressants and is "not a happy sight". She would like to make sure she gets a call before 5 pm today.

## 2021-10-06 NOTE — Telephone Encounter (Addendum)
Pt called to report that her BP is now systolic 091'Z... she does not have her readings available... she says her diastolic is "normal".. she has stopped her Effexor a few days ago after weaning down to one a day.. she is taking her fluid pills... she says she is "not happy" off of the Effexor.. I suggested that she have her husband take her to he local Urgent Care for a repeat BP to be sure it is indeed that high... and she agrees and will go this afternoon.   *She denies chest pain, headache, slurred speech, dizziness.

## 2021-10-06 NOTE — Patient Instructions (Addendum)
F/u with Dr Posey Pronto in 2 to 3 weeks, call if you need to be seen sooner  New for blood pressure is cozaar 50 mg one daily, start today, take at the same time every day  Continue atenolol twice daily as before  STOP lisinopril and spironolactone  Resume effexor one daily for 5 days, then one twice daily as before  Thanks for choosing [facility], we consider it a privelige to serve you.

## 2021-10-09 ENCOUNTER — Encounter: Payer: Self-pay | Admitting: Family Medicine

## 2021-10-09 NOTE — Assessment & Plan Note (Addendum)
Uncontrolled off medication which she stopped but is willing to resume as she is very aware that she needs medication and I have advised that her medication does not increase BP Will start one daily for 5 days , then increase tpo the twice daily dose she has been on No report of suicidal or homicidal ideation

## 2021-10-09 NOTE — Progress Notes (Signed)
   [name] [name]     MRN: [id number]      DOB: Feb 15, 1951   HPI [name] [name] is here with a c/o uncontrolled and markedly elevated blood pressure. C/o marked intolerance to the spironolactone recently started by Cardiology States she attributed this to her antidepressant which she has been on for some time, so she stopped the antidepressant and feels very fatigued and down states she needs to be on medication for depression so she does not know what to do   ROS Denies recent fever or chills. Denies sinus pressure, nasal congestion, ear pain or sore throat. Denies chest congestion, productive cough or wheezing. Denies chest pains, palpitations and leg swelling . C/o  depression and  anxiety   PE  BP (!) 186/72   Pulse (!) 58   Temp 98.2 F (36.8 C) (Oral)   Ht 5\' 3"  (1.6 m)   Wt 134 lb 0.6 oz (60.8 kg)   SpO2 97%   BMI 23.74 kg/m   Patient alert and oriented  HEENT: No facial asymmetry, EOMI,     Neck supple .  Chest: Clear to auscultation bilaterally.  CVS: S1, S2 systolic  murmur, no S3.Regular rate.  Ext: No edema  MS: Adequate ROM spine, shoulders, hips and knees.  Skin: Intact, no ulcerations or rash noted.  Psych: Good eye contact, normal affect. Memory intact  anxious and  depressed appearing.  CNS: CN 2-12 intact, power,  normal throughout.no focal deficits noted.   Assessment & Plan  Essential hypertension, benign Elevated and uncontrolled, also reporting intolerance to spironolactone Stop lisinopril, new is cozaar 50 mg daily, continue atenolol twice daily as before DASH diet and commitment to daily physical activity for a minimum of 30 minutes discussed and encouraged, as a part of hypertension management. The importance of attaining a healthy weight is also discussed.  BP/Weight 10/06/2021 08/31/2021 05/31/2021 05/25/2021 03/24/2021 02/09/2021 6/38/9373  Systolic BP 428 768 115 726 203 559 741  Diastolic BP 72 72 64 78 64 70 59  Wt. (Lbs) 134.04 136.08  146 147 145 145.8 147  BMI 23.74 24.11 25.86 26.04 25.69 25.83 24.46  Some encounter information is confidential and restricted. Go to Review Flowsheets activity to see all data.     Close f/u with PCP  Major depressive disorder, single episode, unspecified Uncontrolled off medication which she stopped but is willing to resume as she is very aware that she needs medication and I have advised that her medication does not increase BP Will start one daily for 5 days , then increase tpo the twice daily dose she has been on No report of suicidal or homicidal ideation

## 2021-10-09 NOTE — Assessment & Plan Note (Signed)
Elevated and uncontrolled, also reporting intolerance to spironolactone Stop lisinopril, new is cozaar 50 mg daily, continue atenolol twice daily as before DASH diet and commitment to daily physical activity for a minimum of 30 minutes discussed and encouraged, as a part of hypertension management. The importance of attaining a healthy weight is also discussed.  BP/Weight 10/06/2021 08/31/2021 05/31/2021 05/25/2021 03/24/2021 02/09/2021 06/07/6332  Systolic BP 545 625 638 937 342 876 811  Diastolic BP 72 72 64 78 64 70 59  Wt. (Lbs) 134.04 136.08 146 147 145 145.8 147  BMI 23.74 24.11 25.86 26.04 25.69 25.83 24.46  Some encounter information is confidential and restricted. Go to Review Flowsheets activity to see all data.     Close f/u with PCP

## 2021-10-14 ENCOUNTER — Other Ambulatory Visit: Payer: Self-pay | Admitting: Cardiovascular Disease

## 2021-10-14 ENCOUNTER — Other Ambulatory Visit: Payer: Self-pay | Admitting: Internal Medicine

## 2021-10-14 DIAGNOSIS — W57XXXA Bitten or stung by nonvenomous insect and other nonvenomous arthropods, initial encounter: Secondary | ICD-10-CM

## 2021-10-31 ENCOUNTER — Other Ambulatory Visit: Payer: Self-pay

## 2021-10-31 ENCOUNTER — Ambulatory Visit (INDEPENDENT_AMBULATORY_CARE_PROVIDER_SITE_OTHER): Payer: Medicare HMO | Admitting: Internal Medicine

## 2021-10-31 ENCOUNTER — Encounter: Payer: Self-pay | Admitting: Internal Medicine

## 2021-10-31 VITALS — BP 188/88 | HR 66 | Resp 16 | Ht 63.0 in | Wt 140.1 lb

## 2021-10-31 DIAGNOSIS — I10 Essential (primary) hypertension: Secondary | ICD-10-CM

## 2021-10-31 DIAGNOSIS — G4700 Insomnia, unspecified: Secondary | ICD-10-CM | POA: Diagnosis not present

## 2021-10-31 DIAGNOSIS — F329 Major depressive disorder, single episode, unspecified: Secondary | ICD-10-CM | POA: Diagnosis not present

## 2021-10-31 MED ORDER — METOPROLOL TARTRATE 50 MG PO TABS: 50.0000 mg | ORAL_TABLET | Freq: Two times a day (BID) | ORAL | 0 refills | Status: DC

## 2021-10-31 MED ORDER — LOSARTAN POTASSIUM 50 MG PO TABS: 50.0000 mg | ORAL_TABLET | Freq: Every day | ORAL | 0 refills | Status: DC

## 2021-10-31 NOTE — Patient Instructions (Signed)
Please start taking Metoprolol 50 mg instead of Atenolol.  Please continue taking Losartan as prescribed.

## 2021-10-31 NOTE — Assessment & Plan Note (Signed)
Did not tolerate Zoloft - drowsiness and diarrhea Cymbalta was ineffective Did not like effect of Lexapro Better with Effexor 37.5 mg twice daily for anxiety, depression and hot flashes Doubt elevated BP due to effexor, but her insomnia is a likely reason

## 2021-10-31 NOTE — Progress Notes (Signed)
Established Patient Office Visit  Subjective:  Patient ID: [name] [name], female    DOB: 1951-02-09  Age: 69 y.o. MRN: [id number]  CC:  Chief Complaint  Patient presents with   Follow-up    3 week follow up pt still having palpitations also doesn't sleep more than 2 hours at a time     HPI [name] [name] is a 70 y.o. female with past medical history of NASH related liver cirrhosis, portal hypertensive gastropathy, esophageal varices, HTN, AS, DM, HLD, MGUS, iron deficiency anemia, depression and anxiety who presents for f/u of HTN and insomnia.  HTN: Her BP was elevated in the office today upon multiple measurements.  Lisinopril and spironolactone were discontinued in the last visit by Dr. Moshe Cipro as she was not tolerating them.  She has started taking losartan 50 mg daily and continues to take atenolol 50 mg twice daily.  She has mild headache, but denies any chest pain dyspnea.  She has chronic palpitations for which she has been taking atenolol, but thinks that it is not working properly.  Of note, her heart rate is in 60s today.  Insomnia: She sleeps only 2 to 3 hours at nighttime.  She was placed on Effexor for depression, and has been doing well with it.  She was told to hold Effexor as there was concern it causing elevated BP, but her BP was uncontrolled even before she started taking Effexor and has been indifferent while taking Effexor. She does not want to take any habit-forming medicine for insomnia.  She is willing to take melatonin.  Past Medical History:  Diagnosis Date   Allergy    Anemia, unspecified    Anxiety    Arthritis    Chronic diarrhea    Cirrhosis (HCC)    Depression    Depression    Elevated cholesterol    Enterocolitis due to Clostridium difficile, not specified as recurrent    Gastro-esophageal reflux disease with esophagitis    Helicobacter pylori gastritis 07/10/2017   Dx EGD -  1) Omeprazole 20 mg 2 times a day x 14 d 2) Pepto Bismol 2 tabs (262  mg each) 4 times a day x 14 d 3) Metronidazole 250 mg 4 times a day x 14 d 4) doxycycline 100 mg 2 times a day x 14 d  After 14 d stop omeprazole also  In 4 weeks after treatment completed do H. Pylori stool antigen - dx H. Pylori gastritis    Hx of adenomatous colonic polyps 10/05/2015   Hyperlipemia 02/21/09   NUC STRESS TEST-NORMAL- EF 74%   Iron deficiency anemia    Irritable bowel syndrome    Localized edema    Major depressive disorder, single episode, unspecified    MVP (mitral valve prolapse)    Rectocele    Thrombocytopenia (HCC)    Type 2 diabetes mellitus with hyperglycemia ([place])     Past Surgical History:  Procedure Laterality Date   ABDOMINAL HYSTERECTOMY     BILATERAL SALPINGOOPHORECTOMY     carpel tunnel Bilateral 12/17/2009   COLONOSCOPY     ESOPHAGOGASTRODUODENOSCOPY     TUBAL LIGATION      Family History  Problem Relation Age of Onset   Anxiety disorder Mother    ADD / ADHD Brother    Alcohol abuse Brother        x 3   Drug abuse Brother    Anxiety disorder Maternal Aunt    Anxiety disorder Maternal Grandmother  Bipolar disorder Other    Emphysema Father    Diabetes Sister    Hyperlipidemia Sister    Cirrhosis Sister    Hyperlipidemia Sister    Diabetes Sister    Diabetes Maternal Uncle    Dementia Neg Hx    OCD Neg Hx    Paranoid behavior Neg Hx    Physical abuse Neg Hx    Schizophrenia Neg Hx    Seizures Neg Hx    Sexual abuse Neg Hx    Colon cancer Neg Hx    Colon polyps Neg Hx    Pancreatic cancer Neg Hx    Esophageal cancer Neg Hx    Stomach cancer Neg Hx    Kidney disease Neg Hx    Liver disease Neg Hx    Rectal cancer Neg Hx     Social History   Socioeconomic History   Marital status: Married    Spouse name: Louie Casa   Number of children: 2   Years of education: Not on file   Highest education level: Not on file  Occupational History   Not on file  Tobacco Use   Smoking status: Former    Years: 30.00    Types: Cigarettes     Quit date: 09/03/1997    Years since quitting: 24.1   Smokeless tobacco: Current    Types: Snuff   Tobacco comments:    call when ready to quit  Vaping Use   Vaping Use: Never used  Substance and Sexual Activity   Alcohol use: No    Alcohol/week: 0.0 standard drinks   Drug use: No   Sexual activity: Not on file  Other Topics Concern   Not on file  Social History Narrative   Married 2 sons 1 lives in [place] 1 lives in [place]   She is a housewife   No alcohol tobacco or drug use at this time   Former smoker quit in [place] Resource Strain: Low Risk    Difficulty of Paying Living Expenses: Not hard at all  Food Insecurity: No Food Insecurity   Worried About Charity fundraiser in the Last Year: Never true   Arboriculturist in the Last Year: Never true  Transportation Needs: No Transportation Needs   Lack of Transportation (Medical): No   Lack of Transportation (Non-Medical): No  Physical Activity: Sufficiently Active   Days of Exercise per Week: 5 days   Minutes of Exercise per Session: 30 min  Stress: No Stress Concern Present   Feeling of Stress : Not at all  Social Connections: Moderately Integrated   Frequency of Communication with Friends and Family: More than three times a week   Frequency of Social Gatherings with Friends and Family: More than three times a week   Attends Religious Services: More than 4 times per year   Active Member of [employer] or Organizations: No   Attends Archivist Meetings: Never   Marital Status: Married  Human resources officer Violence: Not At Risk   Fear of Current or Ex-Partner: No   Emotionally Abused: No   Physically Abused: No   Sexually Abused: No    Outpatient Medications Prior to Visit  Medication Sig Dispense Refill   BIOTIN PO Take 500 mg by mouth daily.      ezetimibe (ZETIA) 10 MG tablet Take 1 tablet (10 mg total) by mouth daily. 90 tablet 3   ferrous sulfate 325 (65  FE) MG tablet Take 325 mg by mouth daily with breakfast.     glipiZIDE (GLUCOTROL) 10 MG tablet Take 10 mg by mouth daily before breakfast. 1 Tablet Daily Before Breakfast     Insulin Pen Needle 32G X 4 MM MISC 1 EACH BY DOES NOT APPLY ROUTE 4 (FOUR) TIMES DAILY. 150 each 5   rosuvastatin (CRESTOR) 5 MG tablet Take 5 mg by mouth daily. Takes one on Monday, Wed., and Friday     triamcinolone cream (KENALOG) 0.1 % APPLY TO AFFECTED AREA TWICE A DAY 30 g 0   venlafaxine (EFFEXOR) 37.5 MG tablet TAKE 1 TABLET BY MOUTH 2 TIMES DAILY. 180 tablet 2   atenolol (TENORMIN) 50 MG tablet TAKE 1 TABLET BY MOUTH TWICE A DAY 120 tablet 0   losartan (COZAAR) 50 MG tablet Take 1 tablet (50 mg total) by mouth daily. 30 tablet 0   insulin glargine, 1 Unit Dial, (TOUJEO SOLOSTAR) 300 UNIT/ML Solostar Pen Inject 60 Units into the skin 1 day or 1 dose for 1 dose. 6 mL 2   Facility-Administered Medications Prior to Visit  Medication Dose Route Frequency Provider Last Rate Last Admin   0.9 %  sodium chloride infusion  500 mL Intravenous Once Gatha Mayer, MD        Allergies  Allergen Reactions   Codeine Other (See Comments)    Took it years ago and doesn't remember the reaction   Spironolactone Other (See Comments)    Reports intolerance , states unable to take the medication    ROS Review of Systems  Constitutional:  Negative for chills and fever.  HENT:  Negative for congestion, sinus pressure, sinus pain and sore throat.   Eyes:  Negative for pain and discharge.  Respiratory:  Negative for cough and shortness of breath.   Cardiovascular:  Negative for chest pain and palpitations.  Gastrointestinal:  Negative for abdominal pain, constipation, diarrhea, nausea and vomiting.  Endocrine: Negative for polydipsia and polyuria.  Genitourinary:  Negative for dysuria and hematuria.  Musculoskeletal:  Negative for neck pain and neck stiffness.  Skin:  Positive for rash.  Neurological:  Negative for dizziness  and weakness.  Psychiatric/Behavioral:  Positive for dysphoric mood and sleep disturbance. Negative for agitation and behavioral problems. The patient is nervous/anxious.      Objective:    Physical Exam Vitals reviewed.  Constitutional:      General: She is not in acute distress.    Appearance: She is not diaphoretic.  HENT:     Head: Normocephalic and atraumatic.     Nose: Nose normal.     Mouth/Throat:     Mouth: Mucous membranes are moist.  Eyes:     General: No scleral icterus.    Extraocular Movements: Extraocular movements intact.  Cardiovascular:     Rate and Rhythm: Normal rate and regular rhythm.     Pulses: Normal pulses.     Heart sounds: Murmur (Systolic over left upper sternal border) heard.  Pulmonary:     Breath sounds: Normal breath sounds. No wheezing or rales.  Abdominal:     Palpations: Abdomen is soft.     Tenderness: There is no abdominal tenderness.  Musculoskeletal:     Cervical back: Neck supple. No tenderness.     Right lower leg: No edema.     Left lower leg: No edema.  Skin:    General: Skin is warm.     Findings: Rash (about 1 cm diameter erythema with central scab  over left foot) present.  Neurological:     General: No focal deficit present.     Mental Status: She is alert and oriented to person, place, and time.  Psychiatric:        Mood and Affect: Mood normal.        Behavior: Behavior normal.    BP (!) 188/88 (BP Location: Right Arm, Cuff Size: Normal)   Pulse 66   Resp 16   Ht _0  (1.6 m)   Wt 140 lb 1.9 oz (63.6 kg)   SpO2 98%   BMI 24.82 kg/m  Wt Readings from Last 3 Encounters:  10/31/21 140 lb 1.9 oz (63.6 kg)  10/06/21 134 lb 0.6 oz (60.8 kg)  08/31/21 136 lb 1.3 oz (61.7 kg)    Lab Results  Component Value Date   TSH 2.13 02/09/2020   Lab Results  Component Value Date   WBC 5.1 09/05/2021   HGB 12.5 09/05/2021   HCT 36.5 09/05/2021   MCV 84 09/05/2021   PLT 60 (LL) 09/05/2021   Lab Results  Component  Value Date   NA 142 09/05/2021   K 4.1 09/05/2021   CO2 24 09/05/2021   GLUCOSE 98 09/05/2021   BUN 14 09/05/2021   CREATININE 0.84 09/05/2021   BILITOT 0.8 09/05/2021   ALKPHOS 47 09/05/2021   AST 29 09/05/2021   ALT 18 09/05/2021   PROT 6.8 09/05/2021   ALBUMIN 3.9 09/05/2021   CALCIUM 8.9 09/05/2021   EGFR 76 09/05/2021   Lab Results  Component Value Date   CHOL 214 (H) 09/05/2021   Lab Results  Component Value Date   HDL 54 09/05/2021   Lab Results  Component Value Date   LDLCALC 143 (H) 09/05/2021   Lab Results  Component Value Date   TRIG 93 09/05/2021   Lab Results  Component Value Date   CHOLHDL 4.0 09/05/2021   Lab Results  Component Value Date   HGBA1C 7.5 (H) 09/05/2021      Assessment & Plan:   Problem List Items Addressed This Visit       Cardiovascular and Mediastinum   Essential hypertension, benign - Primary    BP Readings from Last 1 Encounters:  10/31/21 (!) 188/88  uncontrolled with Losartan and Atenolol Switched to Metoprolol due to persistent palpitations Her insomnia and anxiety are contributing to HTN as well Counseled for compliance with the medications Advised DASH diet and moderate exercise/walking, at least 150 mins/week       Relevant Medications   metoprolol tartrate (LOPRESSOR) 50 MG tablet   losartan (COZAAR) 50 MG tablet     Other   Major depressive disorder, single episode, unspecified    Did not tolerate Zoloft - drowsiness and diarrhea Cymbalta was ineffective Did not like effect of Lexapro Better with Effexor 37.5 mg twice daily for anxiety, depression and hot flashes Doubt elevated BP due to effexor, but her insomnia is a likely reason      Insomnia    Does not want any habit forming medicine Discussed about as needed Ambien, but she denies for now She prefers to try melatonin first       Meds ordered this encounter  Medications   metoprolol tartrate (LOPRESSOR) 50 MG tablet    Sig: Take 1 tablet  (50 mg total) by mouth 2 (two) times daily.    Dispense:  180 tablet    Refill:  0    DC Atenolol   losartan (COZAAR) 50 MG tablet  Sig: Take 1 tablet (50 mg total) by mouth daily.    Dispense:  30 tablet    Refill:  0    Follow-up: Return in about 3 weeks (around 11/21/2021).    Lindell Spar, MD

## 2021-10-31 NOTE — Assessment & Plan Note (Signed)
Does not want any habit forming medicine Discussed about as needed Ambien, but she denies for now She prefers to try melatonin first

## 2021-10-31 NOTE — Assessment & Plan Note (Signed)
BP Readings from Last 1 Encounters:  10/31/21 (!) 188/88   uncontrolled with Losartan and Atenolol Switched to Metoprolol due to persistent palpitations Her insomnia and anxiety are contributing to HTN as well Counseled for compliance with the medications Advised DASH diet and moderate exercise/walking, at least 150 mins/week

## 2021-11-01 ENCOUNTER — Ambulatory Visit: Payer: Medicare HMO | Admitting: Cardiovascular Disease

## 2021-11-15 ENCOUNTER — Telehealth: Payer: Self-pay | Admitting: Internal Medicine

## 2021-11-15 ENCOUNTER — Other Ambulatory Visit: Payer: Self-pay

## 2021-11-15 MED ORDER — INSULIN PEN NEEDLE 32G X 4 MM MISC: 1.0000 | Freq: Four times a day (QID) | 5 refills | Status: DC

## 2021-11-15 NOTE — Telephone Encounter (Signed)
Patient needs pen needles called in to [facility]

## 2021-11-15 NOTE — Telephone Encounter (Signed)
Refills sent

## 2021-11-16 ENCOUNTER — Ambulatory Visit: Payer: Medicare HMO | Admitting: Internal Medicine

## 2021-11-21 ENCOUNTER — Ambulatory Visit (INDEPENDENT_AMBULATORY_CARE_PROVIDER_SITE_OTHER): Payer: Medicare HMO | Admitting: Internal Medicine

## 2021-11-21 ENCOUNTER — Encounter: Payer: Self-pay | Admitting: Internal Medicine

## 2021-11-21 ENCOUNTER — Other Ambulatory Visit: Payer: Self-pay

## 2021-11-21 DIAGNOSIS — F329 Major depressive disorder, single episode, unspecified: Secondary | ICD-10-CM | POA: Diagnosis not present

## 2021-11-21 DIAGNOSIS — E782 Mixed hyperlipidemia: Secondary | ICD-10-CM

## 2021-11-21 DIAGNOSIS — I10 Essential (primary) hypertension: Secondary | ICD-10-CM | POA: Diagnosis not present

## 2021-11-21 DIAGNOSIS — J0110 Acute frontal sinusitis, unspecified: Secondary | ICD-10-CM | POA: Diagnosis not present

## 2021-11-21 MED ORDER — AMOXICILLIN-POT CLAVULANATE 875-125 MG PO TABS: 1.0000 | ORAL_TABLET | Freq: Two times a day (BID) | ORAL | 0 refills | Status: DC

## 2021-11-21 MED ORDER — LOSARTAN POTASSIUM 100 MG PO TABS
100.0000 mg | ORAL_TABLET | Freq: Every day | ORAL | 1 refills | Status: DC
Start: 2021-11-21 — End: 2021-12-13

## 2021-11-21 NOTE — Progress Notes (Signed)
Virtual Visit via Telephone Note   This visit type was conducted due to national recommendations for restrictions regarding the COVID-19 Pandemic (e.g. social distancing) in an effort to limit this patient's exposure and mitigate transmission in our community.  Due to her co-morbid illnesses, this patient is at least at moderate risk for complications without adequate follow up.  This format is felt to be most appropriate for this patient at this time.  The patient did not have access to video technology/had technical difficulties with video requiring transitioning to audio format only (telephone).  All issues noted in this document were discussed and addressed.  No physical exam could be performed with this format.   Evaluation Performed:  Follow-up visit  Date:  11/21/2021   ID:  [name] [name], [name] [name] 1951/05/12, MRN [id number]  Patient Location: Home Provider Location: Office/Clinic  Participants: Patient Location of Patient: Home Location of Provider: Telehealth Consent was obtain for visit to be over via telehealth. I verified that I am speaking with the correct person using two identifiers.  PCP:  Lindell Spar, MD   Chief Complaint: Nasal congestion and sinus pressure related headache  History of Present Illness:    [name] [name] is a 70 y.o. female who has a televisit for complaint of nasal congestion and sinus pressure related headache for about a week now.  She denies any fever, chills, but does report postnasal drip and sore throat.  She denies any dyspnea or wheezing currently.  Heart BP has been uncontrolled at home and states that once it reached above 200 at home.  She has been taking losartan and metoprolol, although compliance is questionable.  She does have headache, but denies any chest pain, dyspnea or palpitations.  The patient does not have symptoms concerning for COVID-19 infection (fever, chills, cough, or new shortness of breath).   Past Medical,  Surgical, Social History, Allergies, and Medications have been Reviewed.  Past Medical History:  Diagnosis Date   Allergy    Anemia, unspecified    Anxiety    Arthritis    Chronic diarrhea    Cirrhosis (HCC)    Depression    Depression    Elevated cholesterol    Enterocolitis due to Clostridium difficile, not specified as recurrent    Gastro-esophageal reflux disease with esophagitis    Helicobacter pylori gastritis 07/10/2017   Dx EGD -  1) Omeprazole 20 mg 2 times a day x 14 d 2) Pepto Bismol 2 tabs (262 mg each) 4 times a day x 14 d 3) Metronidazole 250 mg 4 times a day x 14 d 4) doxycycline 100 mg 2 times a day x 14 d  After 14 d stop omeprazole also  In 4 weeks after treatment completed do H. Pylori stool antigen - dx H. Pylori gastritis    Hx of adenomatous colonic polyps 10/05/2015   Hyperlipemia 02/21/09   NUC STRESS TEST-NORMAL- EF 74%   Iron deficiency anemia    Irritable bowel syndrome    Localized edema    Major depressive disorder, single episode, unspecified    MVP (mitral valve prolapse)    Rectocele    Thrombocytopenia (HCC)    Type 2 diabetes mellitus with hyperglycemia ([place])    Past Surgical History:  Procedure Laterality Date   ABDOMINAL HYSTERECTOMY     BILATERAL SALPINGOOPHORECTOMY     carpel tunnel Bilateral 12/17/2009   COLONOSCOPY     ESOPHAGOGASTRODUODENOSCOPY     TUBAL LIGATION  Current Meds  Medication Sig   BIOTIN PO Take 500 mg by mouth daily.    ezetimibe (ZETIA) 10 MG tablet Take 1 tablet (10 mg total) by mouth daily.   ferrous sulfate 325 (65 FE) MG tablet Take 325 mg by mouth daily with breakfast.   glipiZIDE (GLUCOTROL) 10 MG tablet Take 10 mg by mouth daily before breakfast. 1 Tablet Daily Before Breakfast   Insulin Pen Needle 32G X 4 MM MISC 1 each by Does not apply route 4 (four) times daily.   losartan (COZAAR) 50 MG tablet Take 1 tablet (50 mg total) by mouth daily.   metoprolol tartrate (LOPRESSOR) 50 MG tablet Take 1 tablet (50  mg total) by mouth 2 (two) times daily.   rosuvastatin (CRESTOR) 5 MG tablet Take 5 mg by mouth daily. Takes one on Monday, Wed., and Friday   triamcinolone cream (KENALOG) 0.1 % APPLY TO AFFECTED AREA TWICE A DAY   venlafaxine (EFFEXOR) 37.5 MG tablet TAKE 1 TABLET BY MOUTH 2 TIMES DAILY.   Current Facility-Administered Medications for the 11/21/21 encounter (Office Visit) with Lindell Spar, MD  Medication   0.9 %  sodium chloride infusion     Allergies:   Codeine and Spironolactone   ROS:   Please see the history of present illness.     All other systems reviewed and are negative.   Labs/Other Tests and Data Reviewed:    Recent Labs: 09/05/2021: ALT 18; BUN 14; Creatinine, Ser 0.84; Hemoglobin 12.5; Platelets 60; Potassium 4.1; Sodium 142   Recent Lipid Panel Lab Results  Component Value Date/Time   CHOL 214 (H) 09/05/2021 08:45 AM   TRIG 93 09/05/2021 08:45 AM   HDL 54 09/05/2021 08:45 AM   CHOLHDL 4.0 09/05/2021 08:45 AM   CHOLHDL 2.9 04/16/2018 07:32 AM   LDLCALC 143 (H) 09/05/2021 08:45 AM   LDLCALC 105 (H) 04/16/2018 07:32 AM    Wt Readings from Last 3 Encounters:  10/31/21 140 lb 1.9 oz (63.6 kg)  10/06/21 134 lb 0.6 oz (60.8 kg)  08/31/21 136 lb 1.3 oz (61.7 kg)     ASSESSMENT & PLAN:    Acute sinusitis Started Augmentin nasal saline spray as needed for nasal congestion Advised to use humidifier at nighttime  Essential hypertension, benign BP Readings from Last 1 Encounters:  10/31/21 (!) 188/88   uncontrolled with Losartan and Metoprolol, compliance questionable Increased Losartan to 100 mg QD Had switched to Metoprolol due to persistent palpitations Her insomnia and anxiety are contributing to HTN as well Counseled for compliance with the medications Advised DASH diet and moderate exercise/walking, at least 150 mins/week   Time:   Today, I have spent 16 minutes reviewing the chart, including problem list, medications, and with the patient with  telehealth technology discussing the above problems.   Medication Adjustments/Labs and Tests Ordered: Current medicines are reviewed at length with the patient today.  Concerns regarding medicines are outlined above.   Tests Ordered: No orders of the defined types were placed in this encounter.   Medication Changes: No orders of the defined types were placed in this encounter.    Note: This dictation was prepared with Dragon dictation along with smaller phrase technology. Similar sounding words can be transcribed inadequately or may not be corrected upon review. Any transcriptional errors that result from this process are unintentional.      Disposition:  Follow up  Signed, Lindell Spar, MD  11/21/2021 9:31 AM     [facility]  [facility]  

## 2021-11-21 NOTE — Assessment & Plan Note (Signed)
BP Readings from Last 1 Encounters:  10/31/21 (!) 188/88   uncontrolled with Losartan and Metoprolol, compliance questionable Increased Losartan to 100 mg QD Had switched to Metoprolol due to persistent palpitations Her insomnia and anxiety are contributing to HTN as well Counseled for compliance with the medications Advised DASH diet and moderate exercise/walking, at least 150 mins/week

## 2021-11-21 NOTE — Patient Instructions (Signed)
Please start taking losartan 100 mg daily instead of 50 mg.

## 2021-11-23 ENCOUNTER — Telehealth: Payer: Self-pay | Admitting: *Deleted

## 2021-11-23 NOTE — Chronic Care Management (AMB) (Signed)
  Chronic Care Management   Note  11/23/2021 Name: [name] [name] MRN: [id number] DOB: 1951/11/05  [name] [name] is a 70 y.o. year old female who is a primary care patient of Lindell Spar, MD. I reached out to [name] by phone today in response to a referral sent by Ms. Putnam PCP.  Ms. Bautch was given information about Chronic Care Management services today including:  CCM service includes personalized support from designated clinical staff supervised by her physician, including individualized plan of care and coordination with other care providers 24/7 contact phone numbers for assistance for urgent and routine care needs. Service will only be billed when office clinical staff spend 20 minutes or more in a month to coordinate care. Only one practitioner may furnish and bill the service in a calendar month. The patient may stop CCM services at any time (effective at the end of the month) by phone call to the office staff. The patient is responsible for co-pay (up to 20% after annual deductible is met) if co-pay is required by the individual health plan.   Patient agreed to services and verbal consent obtained.   Follow up plan: Face to Face appointment with care management team member scheduled for: 11/29/21  [facility]  Direct Dial: [phone]

## 2021-11-24 ENCOUNTER — Other Ambulatory Visit: Payer: Self-pay | Admitting: Internal Medicine

## 2021-11-24 DIAGNOSIS — I10 Essential (primary) hypertension: Secondary | ICD-10-CM

## 2021-11-29 ENCOUNTER — Encounter (HOSPITAL_BASED_OUTPATIENT_CLINIC_OR_DEPARTMENT_OTHER): Payer: Self-pay | Admitting: Emergency Medicine

## 2021-11-29 ENCOUNTER — Emergency Department (HOSPITAL_BASED_OUTPATIENT_CLINIC_OR_DEPARTMENT_OTHER): Payer: Medicare HMO

## 2021-11-29 ENCOUNTER — Telehealth: Payer: Self-pay | Admitting: Cardiovascular Disease

## 2021-11-29 ENCOUNTER — Ambulatory Visit: Payer: Medicare HMO

## 2021-11-29 ENCOUNTER — Other Ambulatory Visit: Payer: Self-pay

## 2021-11-29 ENCOUNTER — Emergency Department (HOSPITAL_BASED_OUTPATIENT_CLINIC_OR_DEPARTMENT_OTHER)
Admission: EM | Admit: 2021-11-29 | Discharge: 2021-11-29 | Disposition: A | Discharge status: Home / Self Care | Payer: Medicare HMO | Attending: Emergency Medicine | Admitting: Emergency Medicine

## 2021-11-29 DIAGNOSIS — R519 Headache, unspecified: Secondary | ICD-10-CM | POA: Diagnosis not present

## 2021-11-29 DIAGNOSIS — Z79899 Other long term (current) drug therapy: Secondary | ICD-10-CM | POA: Diagnosis not present

## 2021-11-29 DIAGNOSIS — Z7984 Long term (current) use of oral hypoglycemic drugs: Secondary | ICD-10-CM | POA: Diagnosis not present

## 2021-11-29 DIAGNOSIS — Z87891 Personal history of nicotine dependence: Secondary | ICD-10-CM | POA: Insufficient documentation

## 2021-11-29 DIAGNOSIS — I10 Essential (primary) hypertension: Secondary | ICD-10-CM | POA: Insufficient documentation

## 2021-11-29 DIAGNOSIS — E119 Type 2 diabetes mellitus without complications: Secondary | ICD-10-CM | POA: Diagnosis not present

## 2021-11-29 HISTORY — DX: Essential (primary) hypertension: I10

## 2021-11-29 LAB — BASIC METABOLIC PANEL
Anion gap: 7 (ref 5–15)
BUN: 15 mg/dL (ref 8–23)
CO2: 28 mmol/L (ref 22–32)
Calcium: 9.3 mg/dL (ref 8.9–10.3)
Chloride: 106 mmol/L (ref 98–111)
Creatinine, Ser: 0.75 mg/dL (ref 0.44–1.00)
GFR, Estimated: >60 mL/min (ref >60)
Glucose, Bld: 148 mg/dL — ABNORMAL HIGH (ref 70–99)
Potassium: 3.7 mmol/L (ref 3.5–5.1)
Sodium: 141 mmol/L (ref 135–145)

## 2021-11-29 LAB — URINALYSIS, ROUTINE W REFLEX MICROSCOPIC
Bilirubin Urine: NEGATIVE
Glucose, UA: NEGATIVE mg/dL
Ketones, ur: NEGATIVE mg/dL
Leukocytes,Ua: NEGATIVE
Nitrite: NEGATIVE
Protein, ur: >300 mg/dL — AB
Specific Gravity, Urine: 1.021 (ref 1.005–1.030)
pH: 6.5 (ref 5.0–8.0)

## 2021-11-29 LAB — CBC WITH DIFFERENTIAL/PLATELET
Abs Immature Granulocytes: 0.02 10*3/uL (ref 0.00–0.07)
Basophils Absolute: 0 10*3/uL (ref 0.0–0.1)
Basophils Relative: 0 %
Eosinophils Absolute: 0 10*3/uL (ref 0.0–0.5)
Eosinophils Relative: 0 %
HCT: 38.5 % (ref 36.0–46.0)
Hemoglobin: 13.3 g/dL (ref 12.0–15.0)
Immature Granulocytes: 0 %
Lymphocytes Relative: 19 %
Lymphs Abs: 1.1 10*3/uL (ref 0.7–4.0)
MCH: 29.2 pg (ref 26.0–34.0)
MCHC: 34.5 g/dL (ref 30.0–36.0)
MCV: 84.4 fL (ref 80.0–100.0)
Monocytes Absolute: 0.4 10*3/uL (ref 0.1–1.0)
Monocytes Relative: 7 %
Neutro Abs: 4.4 10*3/uL (ref 1.7–7.7)
Neutrophils Relative %: 74 %
Platelets: 74 10*3/uL — ABNORMAL LOW (ref 150–400)
RBC: 4.56 MIL/uL (ref 3.87–5.11)
RDW: 14.5 % (ref 11.5–15.5)
WBC: 6 10*3/uL (ref 4.0–10.5)
nRBC: 0 % (ref 0.0–0.2)

## 2021-11-29 MED ORDER — HYDRALAZINE HCL 25 MG PO TABS: 25.0000 mg | ORAL_TABLET | Freq: Three times a day (TID) | ORAL | 0 refills | Status: DC

## 2021-11-29 MED ORDER — METOPROLOL TARTRATE 25 MG PO TABS
50.0000 mg | ORAL_TABLET | Freq: Once | ORAL | Status: AC
  Administered 2021-11-29: 21:00:00 50 mg via ORAL
  Filled 2021-11-29: qty 2

## 2021-11-29 MED ORDER — HYDRALAZINE HCL 25 MG PO TABS
50.0000 mg | ORAL_TABLET | Freq: Once | ORAL | Status: AC
  Administered 2021-11-29: 19:00:00 50 mg via ORAL
  Filled 2021-11-29: qty 2

## 2021-11-29 MED ORDER — ACETAMINOPHEN 325 MG PO TABS
650.0000 mg | ORAL_TABLET | Freq: Once | ORAL | Status: AC
  Administered 2021-11-29: 20:00:00 650 mg via ORAL
  Filled 2021-11-29: qty 2

## 2021-11-29 NOTE — Telephone Encounter (Signed)
° °  Pt c/o BP issue: STAT if pt c/o blurred vision, one-sided weakness or slurred speech  1. What are your last 5 BP readings? Systolic been 203 for 2 days  2. Are you having any other symptoms (ex. Dizziness, headache, blurred vision, passed out)?   3. What is your BP issue? Pt's son called, he said pt's systolic number been over 200 for the past 2 days. They are planning to bring pt to [facility] ED but also needs recommendations from Dr. Loletha Grayer

## 2021-11-29 NOTE — Telephone Encounter (Signed)
Returned the call to the patient and the son (per the patient's permission). The patient stated that she has been having elevated blood pressure in the 742'V systolic. She did not have any specific readings but stated that this afternoon it was 956 systolic. She has been symptomatic with complaints of headaches and dizziness.  She saw her PCP on 11/15 and per his note: uncontrolled with Losartan and Metoprolol, compliance questionable Increased Losartan to 100 mg QD Had switched to Metoprolol due to persistent palpitations Her insomnia and anxiety are contributing to HTN as well Counseled for compliance with the medications Advised DASH diet and moderate exercise/walking, at least 150 mins/week   She stated that she has been compliant with her medications and does watch her sodium intake  Medications: Metoprolol 50 mg twice daily Losartan 100 mg  Her son stated that she has been stressed and that they have a lot going on medically on both sides of the family. He will be taking her to the ED at [facility]. A follow up appointment has been made with Dr. Sallyanne Kuster on 12/22

## 2021-11-29 NOTE — Discharge Instructions (Signed)
Call your primary care doctor or specialist as discussed in the next 2-3 days.   Return immediately back to the ER if:  Your symptoms worsen within the next 12-24 hours. You develop new symptoms such as new fevers, persistent vomiting, new pain, shortness of breath, or new weakness or numbness, or if you have any other concerns.  

## 2021-11-29 NOTE — ED Triage Notes (Signed)
Reports hypertension for the last several months, headaches this week

## 2021-11-29 NOTE — ED Provider Notes (Signed)
[facility] EMERGENCY DEPT Provider Note   CSN: [id number] Arrival date & time: 11/29/21  1544     History Chief Complaint  Patient presents with   Hypertension    [name] [name] is a 70 y.o. female.  Patient presents to ER chief complaint of elevated blood pressure.  She states that she has had high blood pressure for few months and her doctors have been managing her blood pressure medications but she feels like they are not working well.  She has been having intermittent headaches this week.  Currently denies headache but states she had one earlier this morning.  Denies fevers or cough or vomiting or diarrhea.  No new numbness or weakness.  No chest pain no abdominal pain.      Past Medical History:  Diagnosis Date   Allergy    Anemia, unspecified    Anxiety    Arthritis    Chronic diarrhea    Cirrhosis (HCC)    Depression    Depression    Elevated cholesterol    Enterocolitis due to Clostridium difficile, not specified as recurrent    Gastro-esophageal reflux disease with esophagitis    Helicobacter pylori gastritis 07/10/2017   Dx EGD -  1) Omeprazole 20 mg 2 times a day x 14 d 2) Pepto Bismol 2 tabs (262 mg each) 4 times a day x 14 d 3) Metronidazole 250 mg 4 times a day x 14 d 4) doxycycline 100 mg 2 times a day x 14 d  After 14 d stop omeprazole also  In 4 weeks after treatment completed do H. Pylori stool antigen - dx H. Pylori gastritis    Hx of adenomatous colonic polyps 10/05/2015   Hyperlipemia 02/21/2009   NUC STRESS TEST-NORMAL- EF 74%   Hypertension    Iron deficiency anemia    Irritable bowel syndrome    Localized edema    Major depressive disorder, single episode, unspecified    MVP (mitral valve prolapse)    Rectocele    Thrombocytopenia (HCC)    Type 2 diabetes mellitus with hyperglycemia [facility])     Patient Active Problem List   Diagnosis Date Noted   Secondary esophageal varices without bleeding ([place]) 03/24/2021   Portal  hypertensive gastropathy ([place]) 03/24/2021   MGUS (monoclonal gammopathy of unknown significance) 02/09/2021   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/2021   Aortic valve stenosis 02/09/2021   Chronic diarrhea 02/09/2021   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   Insomnia 02/03/2020   Gastroesophageal reflux disease with esophagitis    Major depressive disorder, single episode, unspecified    Essential hypertension, benign 02/10/2018   Personal history of noncompliance with medical treatment, presenting hazards to health 09/11/2017   Mixed hyperlipidemia 05/29/2016   Hx of adenomatous colonic polyps 10/05/2015   Uncontrolled type 2 diabetes mellitus with complication, without long-term current use of insulin 09/16/2014   Rectocele 03/23/2013   Iron deficiency anemia 06/27/2010   THROMBOCYTOPENIA 06/27/2010   Anxiety state 02/06/2008   CARPAL TUNNEL SYNDROME 02/06/2008    Past Surgical History:  Procedure Laterality Date   ABDOMINAL HYSTERECTOMY     BILATERAL SALPINGOOPHORECTOMY     carpel tunnel Bilateral 12/17/2009   COLONOSCOPY     ESOPHAGOGASTRODUODENOSCOPY     TUBAL LIGATION       OB History     Gravida  2   Para  2   Term      Preterm      AB  Living         SAB      IAB      Ectopic      Multiple      Live Births              Family History  Problem Relation Age of Onset   Anxiety disorder Mother    ADD / ADHD Brother    Alcohol abuse Brother        x 3   Drug abuse Brother    Anxiety disorder Maternal Aunt    Anxiety disorder Maternal Grandmother    Bipolar disorder Other    Emphysema Father    Diabetes Sister    Hyperlipidemia Sister    Cirrhosis Sister    Hyperlipidemia Sister    Diabetes Sister    Diabetes Maternal Uncle    Dementia Neg Hx    OCD Neg Hx    Paranoid behavior Neg Hx    Physical abuse Neg Hx    Schizophrenia Neg Hx    Seizures Neg Hx    Sexual abuse Neg Hx    Colon cancer Neg Hx    Colon polyps Neg Hx     Pancreatic cancer Neg Hx    Esophageal cancer Neg Hx    Stomach cancer Neg Hx    Kidney disease Neg Hx    Liver disease Neg Hx    Rectal cancer Neg Hx     Social History   Tobacco Use   Smoking status: Former    Years: 30.00    Types: Cigarettes    Quit date: 09/03/1997    Years since quitting: 24.2   Smokeless tobacco: Current    Types: Snuff   Tobacco comments:    call when ready to quit  Vaping Use   Vaping Use: Never used  Substance Use Topics   Alcohol use: No    Alcohol/week: 0.0 standard drinks   Drug use: No    Home Medications Prior to Admission medications   Medication Sig Start Date End Date Taking? Authorizing Provider  amoxicillin-clavulanate (AUGMENTIN) 875-125 MG tablet Take 1 tablet by mouth 2 (two) times daily. 11/21/21   Lindell Spar, MD  BIOTIN PO Take 500 mg by mouth daily.     [provider]  ezetimibe (ZETIA) 10 MG tablet Take 1 tablet (10 mg total) by mouth daily. 09/06/21   Lindell Spar, MD  ferrous sulfate 325 (65 FE) MG tablet Take 325 mg by mouth daily with breakfast.    [provider]  glipiZIDE (GLUCOTROL) 10 MG tablet Take 10 mg by mouth daily before breakfast. 1 Tablet Daily Before Breakfast    [provider]  insulin glargine, 1 Unit Dial, (TOUJEO SOLOSTAR) 300 UNIT/ML Solostar Pen Inject 60 Units into the skin 1 day or 1 dose for 1 dose. 05/31/21 06/01/21  Lindell Spar, MD  Insulin Pen Needle 32G X 4 MM MISC 1 each by Does not apply route 4 (four) times daily. 11/15/21   Lindell Spar, MD  losartan (COZAAR) 100 MG tablet Take 1 tablet (100 mg total) by mouth daily. 11/21/21   Lindell Spar, MD  metoprolol tartrate (LOPRESSOR) 50 MG tablet Take 1 tablet (50 mg total) by mouth 2 (two) times daily. 10/31/21   Lindell Spar, MD  rosuvastatin (CRESTOR) 5 MG tablet Take 5 mg by mouth daily. Takes one on Monday, Wed., and Friday    [provider]  triamcinolone cream (KENALOG)  0.1 % APPLY TO  AFFECTED AREA TWICE A DAY 10/16/21   Lindell Spar, MD  venlafaxine (EFFEXOR) 37.5 MG tablet TAKE 1 TABLET BY MOUTH 2 TIMES DAILY. 09/25/21   Lindell Spar, MD    Allergies    Codeine and Spironolactone  Review of Systems   Review of Systems  Constitutional:  Negative for fever.  HENT:  Negative for ear pain.   Eyes:  Negative for pain.  Respiratory:  Negative for cough.   Cardiovascular:  Negative for chest pain.  Gastrointestinal:  Negative for abdominal pain.  Genitourinary:  Negative for flank pain.  Musculoskeletal:  Negative for back pain.  Skin:  Negative for rash.  Neurological:  Positive for headaches.   Physical Exam Updated Vital Signs BP (!) 174/82    Pulse 82    Temp 97.6 F (36.4 C)    Resp 18    SpO2 100%   Physical Exam Constitutional:      General: She is not in acute distress.    Appearance: Normal appearance.  HENT:     Head: Normocephalic.     Nose: Nose normal.  Eyes:     Extraocular Movements: Extraocular movements intact.  Cardiovascular:     Rate and Rhythm: Normal rate.  Pulmonary:     Effort: Pulmonary effort is normal.  Musculoskeletal:        General: Normal range of motion.     Cervical back: Normal range of motion.  Neurological:     General: No focal deficit present.     Mental Status: She is alert and oriented to person, place, and time. Mental status is at baseline.     Cranial Nerves: No cranial nerve deficit.     Motor: No weakness.     Gait: Gait normal.    ED Results / Procedures / Treatments   Labs (all labs ordered are listed, but only abnormal results are displayed) Labs Reviewed  CBC WITH DIFFERENTIAL/PLATELET - Abnormal; Notable for the following components:      Result Value   Platelets 74 (*)    All other components within normal limits  BASIC METABOLIC PANEL - Abnormal; Notable for the following components:   Glucose, Bld 148 (*)    All other components within normal limits  URINALYSIS, ROUTINE W REFLEX  MICROSCOPIC - Abnormal; Notable for the following components:   Hgb urine dipstick MODERATE (*)    Protein, ur >300 (*)    All other components within normal limits    EKG EKG Interpretation  Date/Time:  Wednesday November 29 2021 20:53:40 EST Ventricular Rate:  89 PR Interval:  145 QRS Duration: 85 QT Interval:  388 QTC Calculation: 473 R Axis:   11 Text Interpretation: Sinus rhythm Borderline repol abnrm, anterolateral leads Confirmed by Thamas Jaegers ([id number]) on 11/29/2021 8:55:30 PM  Radiology CT Head Wo Contrast  Result Date: 11/29/2021 CLINICAL DATA:  Headache. EXAM: CT HEAD WITHOUT CONTRAST TECHNIQUE: Contiguous axial images were obtained from the base of the skull through the vertex without intravenous contrast. COMPARISON:  None. FINDINGS: Brain: No evidence of acute infarction, hemorrhage, hydrocephalus, extra-axial collection or mass lesion/mass effect. Vascular: No hyperdense vessel or unexpected calcification. Skull: Normal. Negative for fracture or focal lesion. Sinuses/Orbits: No acute finding. Other: None. IMPRESSION: No acute intracranial abnormality seen. Electronically Signed   By: Marijo Conception M.D.   On: 11/29/2021 19:57    Procedures Procedures   Medications Ordered in ED Medications  hydrALAZINE (APRESOLINE) tablet 50 mg (50  mg Oral Given 11/29/21 1856)  acetaminophen (TYLENOL) tablet 650 mg (650 mg Oral Given 11/29/21 1941)  metoprolol tartrate (LOPRESSOR) tablet 50 mg (50 mg Oral Given 11/29/21 2111)    ED Course  I have reviewed the triage vital signs and the nursing notes.  Pertinent labs & imaging results that were available during my care of the patient were reviewed by me and considered in my medical decision making (see chart for details).    MDM Rules/Calculators/A&P                           Basic labs are sent is unremarkable echo normal chemistry normal hemoglobin normal.  Blood pressure 207/73.  Patient given blood pressure medication  with subsequent improvement pressure now 174/82.  We will add hydralazine to her regimen as her initial heart rate was in the 55 bpm range.  CT imaging of the brain unremarkable.  Patient advised outpatient follow-up with her doctor within the week.  Recommending continue with hydralazine as written.  Recommend immediate return if she has new numbness weakness pain fevers or any additional concerns.  Final Clinical Impression(s) / ED Diagnoses Final diagnoses:  Hypertension, unspecified type    Rx / DC Orders ED Discharge Orders     None        Luna Fuse, MD 11/29/21 2205

## 2021-12-07 ENCOUNTER — Ambulatory Visit: Payer: Medicare HMO | Admitting: Cardiovascular Disease

## 2021-12-07 ENCOUNTER — Other Ambulatory Visit: Payer: Self-pay

## 2021-12-07 ENCOUNTER — Encounter: Payer: Self-pay | Admitting: Cardiovascular Disease

## 2021-12-07 VITALS — BP 164/82 | HR 81 | Ht 63.0 in | Wt 144.0 lb

## 2021-12-07 DIAGNOSIS — I10 Essential (primary) hypertension: Secondary | ICD-10-CM

## 2021-12-07 DIAGNOSIS — I350 Nonrheumatic aortic (valve) stenosis: Secondary | ICD-10-CM | POA: Diagnosis not present

## 2021-12-07 DIAGNOSIS — K7460 Unspecified cirrhosis of liver: Secondary | ICD-10-CM | POA: Diagnosis not present

## 2021-12-07 DIAGNOSIS — R188 Other ascites: Secondary | ICD-10-CM

## 2021-12-07 DIAGNOSIS — R002 Palpitations: Secondary | ICD-10-CM | POA: Diagnosis not present

## 2021-12-07 DIAGNOSIS — E1165 Type 2 diabetes mellitus with hyperglycemia: Secondary | ICD-10-CM

## 2021-12-07 DIAGNOSIS — E7800 Pure hypercholesterolemia, unspecified: Secondary | ICD-10-CM

## 2021-12-07 MED ORDER — METOPROLOL TARTRATE 50 MG PO TABS: 75.0000 mg | ORAL_TABLET | Freq: Two times a day (BID) | ORAL | 11 refills | Status: DC

## 2021-12-07 MED ORDER — AMLODIPINE BESYLATE 5 MG PO TABS: 5.0000 mg | ORAL_TABLET | Freq: Every day | ORAL | 3 refills | Status: DC

## 2021-12-07 MED ORDER — HYDRALAZINE HCL 25 MG PO TABS: 25.0000 mg | ORAL_TABLET | Freq: Three times a day (TID) | ORAL | 0 refills | Status: DC | PRN

## 2021-12-07 NOTE — Progress Notes (Signed)
Cardiology Office Note:    Date:  12/07/2021   ID:  [name] [name], DOB April 10, 1951, MRN [id number]  PCP:  Lindell Spar, MD   [facility] Providers Cardiologist:  Sanda Klein, MD  Referring MD: Lindell Spar, MD   Chief Complaint  Patient presents with   Hypertension          History of Present Illness:    [name] [name] is a 70 y.o. 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  She has been troubled more by palpitations.  Atenolol worked for these for several years, then was switched to metoprolol because atenolol was not working anymore, no metoprolol is now providing relief either.  She is also had elevated blood pressure and actually had to be seen in the [place] ED on 11/29/2021.   After being seen in the emergency room last week, hydralazine 25 mg 3 times a day was added.  She has difficulty taking the 3 daily doses.  She misunderstood the instructions and instead of just adding hydralazine, she stopped taking losartan.  Her blood pressure is still high today.  After her last appointment we started spironolactone.  I am not sure why, but this medication was discontinued.  She had a cough with lisinopril and this was replaced with losartan.  Denies angina or dyspnea at rest or with activity.  She has not had lower extremity edema or ascites.  She has not had syncope.  She continues to be troubled by palpitations, more so than before.  Has difficulty with insomnia and melatonin has not helped.  Tylenol PM taken a couple of nights ago allowed her to sleep well.  Not want to take any habit-forming medications for sleep.  Her diagnosis of cirrhosis is relatively recent.  She underwent endoscopy which presence of grade 2 esophageal  varices and portal hypertensive gastropathy.  Had a remote history of statin intolerance, but doing OK on rosuvastatin 5 mg 3 days a week. Had a normal nuclear stress test in 2010.  Echocardiogram in 2019 showed aortic valve sclerosis with mild insufficiency but no stenosis.  LVEF 60 to 65%.  Follow-up echo in June 2022 showed moderate aortic stenosis mean transvalvular gradient of 20 mmHg and dimensionless index 0.40.  Past Medical History:  Diagnosis Date   Allergy    Anemia, unspecified    Anxiety    Arthritis    Chronic diarrhea    Cirrhosis (HCC)    Depression    Depression    Elevated cholesterol    Enterocolitis due to Clostridium difficile, not specified as recurrent    Gastro-esophageal reflux disease with esophagitis    Helicobacter pylori gastritis 07/10/2017   Dx EGD -  1) Omeprazole 20 mg 2 times a day x 14 d 2) Pepto Bismol 2 tabs (262 mg each) 4 times a day x 14 d 3) Metronidazole 250 mg 4 times a day x 14 d 4) doxycycline 100 mg 2 times a day x 14 d  After 14 d stop omeprazole also  In 4 weeks after treatment completed do H. Pylori stool antigen - dx H. Pylori gastritis    Hx of adenomatous colonic polyps 10/05/2015   Hyperlipemia 02/21/2009   NUC STRESS TEST-NORMAL- EF 74%   Hypertension    Iron deficiency anemia    Irritable bowel  syndrome    Localized edema    Major depressive disorder, single episode, unspecified    MVP (mitral valve prolapse)    Rectocele    Thrombocytopenia (HCC)    Type 2 diabetes mellitus with hyperglycemia (HCC)     Past Surgical History:  Procedure Laterality Date   ABDOMINAL HYSTERECTOMY     BILATERAL SALPINGOOPHORECTOMY     carpel tunnel Bilateral 12/17/2009   COLONOSCOPY     ESOPHAGOGASTRODUODENOSCOPY     TUBAL LIGATION      Current Medications: Current Meds  Medication Sig   amLODipine (NORVASC) 5 MG tablet Take 1 tablet (5 mg total) by mouth daily.   BIOTIN PO Take 500 mg by mouth daily.    ezetimibe (ZETIA) 10 MG tablet  Take 1 tablet (10 mg total) by mouth daily.   ferrous sulfate 325 (65 FE) MG tablet Take 325 mg by mouth daily with breakfast.   glipiZIDE (GLUCOTROL) 10 MG tablet Take 10 mg by mouth daily before breakfast. 1 Tablet Daily Before Breakfast   Insulin Pen Needle 32G X 4 MM MISC 1 each by Does not apply route 4 (four) times daily.   losartan (COZAAR) 100 MG tablet Take 1 tablet (100 mg total) by mouth daily.   rosuvastatin (CRESTOR) 5 MG tablet Take 5 mg by mouth daily. Takes one on Monday, Wed., and Friday   triamcinolone cream (KENALOG) 0.1 % APPLY TO AFFECTED AREA TWICE A DAY   venlafaxine (EFFEXOR) 37.5 MG tablet TAKE 1 TABLET BY MOUTH 2 TIMES DAILY.   [DISCONTINUED] hydrALAZINE (APRESOLINE) 25 MG tablet Take 1 tablet (25 mg total) by mouth 3 (three) times daily.   [DISCONTINUED] metoprolol tartrate (LOPRESSOR) 50 MG tablet Take 1 tablet (50 mg total) by mouth 2 (two) times daily.   Current Facility-Administered Medications for the 12/07/21 encounter (Office Visit) with Oseias Horsey, Dani Gobble, MD  Medication   0.9 %  sodium chloride infusion     Allergies:   Codeine and Spironolactone   Social History   Socioeconomic History   Marital status: Married    Spouse name: Louie Casa   Number of children: 2   Years of education: Not on file   Highest education level: Not on file  Occupational History   Not on file  Tobacco Use   Smoking status: Former    Years: 30.00    Types: Cigarettes    Quit date: 09/03/1997    Years since quitting: 24.2   Smokeless tobacco: Current    Types: Snuff   Tobacco comments:    call when ready to quit  Vaping Use   Vaping Use: Never used  Substance and Sexual Activity   Alcohol use: No    Alcohol/week: 0.0 standard drinks   Drug use: No   Sexual activity: Not on file  Other Topics Concern   Not on file  Social History Narrative   Married 2 sons 1 lives in [place] 1 lives in [place]   She is a housewife   No alcohol tobacco or drug use at this  time   Former smoker quit in [place] Strain: Low Risk    Difficulty of Paying Living Expenses: Not hard at all  Food Insecurity: No Food Insecurity   Worried About Charity fundraiser in the Last Year: Never true   Arboriculturist in the Last Year: Never true  Transportation Needs: No Transportation Needs   Lack of Transportation (Medical):  No   Lack of Transportation (Non-Medical): No  Physical Activity: Sufficiently Active   Days of Exercise per Week: 5 days   Minutes of Exercise per Session: 30 min  Stress: No Stress Concern Present   Feeling of Stress : Not at all  Social Connections: Moderately Integrated   Frequency of Communication with Friends and Family: More than three times a week   Frequency of Social Gatherings with Friends and Family: More than three times a week   Attends Religious Services: More than 4 times per year   Active Member of [employer] or Organizations: No   Attends Archivist Meetings: Never   Marital Status: Married     Family History: The patient's family history includes ADD / ADHD in her brother; Alcohol abuse in her brother; Anxiety disorder in her maternal aunt, maternal grandmother, and mother; Bipolar disorder in an other family member; Cirrhosis in her sister; Diabetes in her maternal uncle, sister, and sister; Drug abuse in her brother; Emphysema in her father; Hyperlipidemia in her sister and sister. There is no history of Dementia, OCD, Paranoid behavior, Physical abuse, Schizophrenia, Seizures, Sexual abuse, Colon cancer, Colon polyps, Pancreatic cancer, Esophageal cancer, Stomach cancer, Kidney disease, Liver disease, or Rectal cancer.  ROS:   Please see the history of present illness.     All other systems reviewed and are negative.  EKGs/Labs/Other Studies Reviewed:    The following studies were reviewed today: Echo 06/14/2021   1. Left ventricular ejection fraction, by  estimation, is 55 to 60%. The  left ventricle has normal function. The left ventricle has no regional  wall motion abnormalities. Left ventricular diastolic parameters are  indeterminate.   2. Right ventricular systolic function is normal. The right ventricular  size is normal. There is normal pulmonary artery systolic pressure. The  estimated right ventricular systolic pressure is 52.8 mmHg.   3. Left atrial size was upper normal.   4. The pericardial effusion is posterior to the left ventricle.   5. The mitral valve is grossly normal. Mild mitral valve regurgitation.   6. The aortic valve is tricuspid with reduced excursion of the right and  noncoronary cusps predominantly. There is mild calcification of the aortic  valve. There is mild thickening of the aortic valve. Aortic valve  regurgitation is mild. Moderate to  severe, relatively low gradient aortic valve stenosis. Aortic  regurgitation PHT measures 800 msec. Aortic valve mean gradient measures  19.7 mmHg. Dimentionless index 0.40.   7. The inferior vena cava is normal in size with greater than 50%  respiratory variability, suggesting right atrial pressure of 3 mmHg.   EKG:  EKG is not ordered today.  The ekg ordered 11/29/2021 demonstrates sinus rhythm, borderline voltage criteria for LVH with secondary repolarization abnormalities.  Recent Labs: 09/05/2021: ALT 18 11/29/2021: BUN 15; Creatinine, Ser 0.75; Hemoglobin 13.3; Platelets 74; Potassium 3.7; Sodium 141  Recent Lipid Panel    Component Value Date/Time   CHOL 214 (H) 09/05/2021 0845   TRIG 93 09/05/2021 0845   HDL 54 09/05/2021 0845   CHOLHDL 4.0 09/05/2021 0845   CHOLHDL 2.9 04/16/2018 0732   VLDL 27 09/13/2014 0730   LDLCALC 143 (H) 09/05/2021 0845   LDLCALC 105 (H) 04/16/2018 0732     Risk Assessment/Calculations:       Physical Exam:    VS:  BP (!) 164/82    Pulse 81    Ht 5\' 3"  (1.6 m)    Wt 144 lb (65.3 kg)  SpO2 98%    BMI 25.51 kg/m     Wt  Readings from Last 3 Encounters:  12/07/21 144 lb (65.3 kg)  10/31/21 140 lb 1.9 oz (63.6 kg)  10/06/21 134 lb 0.6 oz (60.8 kg)     General: Alert, oriented x3, no distress, does not appear pale and does not have jaundice Head: no evidence of trauma, PERRL, EOMI, no exophtalmos or lid lag, no myxedema, no xanthelasma; normal ears, nose and oropharynx Neck: normal jugular venous pulsations and no hepatojugular reflux; brisk carotid pulses without delay and no carotid bruits Chest: clear to auscultation, no signs of consolidation by percussion or palpation, normal fremitus, symmetrical and full respiratory excursions Cardiovascular: normal position and quality of the apical impulse, regular rhythm, normal first and second heart sounds, very prominent honking 3/6 aortic ejection murmur that radiates broadly towards the carotids but also towards the apex and even the back no diastolic murmurs, rubs or gallops Abdomen: no tenderness or distention, no masses by palpation, no abnormal pulsatility or arterial bruits, normal bowel sounds, no hepatosplenomegaly Extremities: no clubbing, cyanosis or edema; 2+ radial, ulnar and brachial pulses bilaterally; 2+ right femoral, posterior tibial and dorsalis pedis pulses; 2+ left femoral, posterior tibial and dorsalis pedis pulses; no subclavian or femoral bruits Neurological: grossly nonfocal Psych: Normal mood and affect   ASSESSMENT:    1. Aortic valve stenosis, nonrheumatic   2. Essential hypertension, benign   3. Hypercholesterolemia   4. Cirrhosis of liver with ascites, unspecified hepatic cirrhosis type ([place])   5. Type 2 diabetes mellitus with hyperglycemia, without long-term current use of insulin (HCC)   6. Palpitations     PLAN:    In order of problems listed above:  AS: Asymptomatic.  Moderate by recent echocardiogram.  Plan to recheck this in a year.  Gradients might be exaggerated by increased cardiac output of cirrhosis.  Nevertheless, it  is quite likely that she will require treatment for aortic stenosis in the next 3-5 years. HLP: Most recent LDL cholesterol was elevated at 95, but this is a remarkable improvement compared to baseline of 201.  Would continue the current dose of statin.  She does have evidence of aortic atherosclerosis, even though she does not have clinical CAD or PAD. HTN: Explained the need for multiple different types of antihypertensive medications used in concert.  We will continue losartan and add amlodipine 5 mg daily.  Also increase the metoprolol to 75 mg twice daily due to her complaints of increasing palpitations.  Not sure whether her spironolactone was stopped inadvertently or due to some type of side effect.  There is no evidence of hyperkalemia or renal dysfunction on the labs that I have available. Cirrhosis: Presumed to be secondary to nonalcoholic steatohepatitis.  She has evidence of significant portal hypertension with esophageal varices, splenomegaly with thrombocytopenia and some mild ascites on imaging studies.  Notes mention that she had some symptoms suggestive of possible hepatic encephalopathy, although today this is not at all apparent.  She is on treatment with rifaximin.  Need to avoid hepatotoxic drugs.  She does not drink alcohol.  Evaluation for alpha-1 antitrypsin deficiency and hemochromatosis with negative results. DM: Borderline control, most recent hemoglobin A1c 7.5% which is a substantial improvement compared to earlier in the year.  Caution with aggressive management in the setting of cirrhosis.  At high risk for developing hypoglycemia. Palpitations: Have not identified the mechanism for her symptoms, but it seems that she is describing isolated PACs and/or PVCs.  We will increase the beta-blocker.  No evidence of atrial fibrillation to date.        Medication Adjustments/Labs and Tests Ordered: Current medicines are reviewed at length with the patient today.  Concerns regarding  medicines are outlined above.  No orders of the defined types were placed in this encounter.  Meds ordered this encounter  Medications   hydrALAZINE (APRESOLINE) 25 MG tablet    Sig: Take 1 tablet (25 mg total) by mouth 3 (three) times daily as needed (For Systolic over 696).    Dispense:  90 tablet    Refill:  0    No refills needed. Note dose change   amLODipine (NORVASC) 5 MG tablet    Sig: Take 1 tablet (5 mg total) by mouth daily.    Dispense:  90 tablet    Refill:  3   metoprolol tartrate (LOPRESSOR) 50 MG tablet    Sig: Take 1.5 tablets (75 mg total) by mouth 2 (two) times daily.    Dispense:  90 tablet    Refill:  11    DC Atenolol    Patient Instructions  Medication Instructions:  START Amlodipine 5 mg once daily  Continue the Losartan 100 mg once daily  INCREASE the Metoprolol Tartrate to 75 mg twice daily (A tablet and a half of the 50 mg tablet)  CHANGE how you take the Hydralazine to as needed up to three times daily for systolic (top number) over 180  *If you need a refill on your cardiac medications before your next appointment, please call your pharmacy*   Lab Work: None ordered If you have labs (blood work) drawn today and your tests are completely normal, you will receive your results only by: [place] (if you have MyChart) OR A paper copy in the mail If you have any lab test that is abnormal or we need to change your treatment, we will call you to review the results.   Testing/Procedures: Your physician has requested that you have an echocardiogram in June 2023. Echocardiography is a painless test that uses sound waves to create images of your heart. It provides your doctor with information about the size and shape of your heart and how well your hearts chambers and valves are working. You may receive an ultrasound enhancing agent through an IV if needed to better visualize your heart during the echo.This procedure takes approximately one hour.  There are no restrictions for this procedure. This will take place at the [address].     Follow-Up: At [facility], you and your health needs are our priority.  As part of our continuing mission to provide you with exceptional heart care, we have created designated Provider Care Teams.  These Care Teams include your primary Cardiologist (physician) and Advanced Practice Providers (APPs -  Physician Assistants and Nurse Practitioners) who all work together to provide you with the care you need, when you need it.  We recommend signing up for the patient portal called "MyChart".  Sign up information is provided on this After Visit Summary.  MyChart is used to connect with patients for Virtual Visits (Telemedicine).  Patients are able to view lab/test results, encounter notes, upcoming appointments, etc.  Non-urgent messages can be sent to your provider as well.   To learn more about what you can do with MyChart, go to [email or web address].    Your next appointment:   Follow up with Dr. Sallyanne Kuster in June 2023 after the echo Follow  up first available with an APP   Other Instructions Dr. Sallyanne Kuster would like you to check your blood pressure daily for the next 2 weeks.  Keep a journal of these daily blood pressure and heart rate readings and call our office or send a message through [place] with the results. Thank you!  It is best to check your BP 1-2 hours after taking your medications to see the medications effectiveness on your BP.    Here are some tips that our clinical pharmacists share for home BP monitoring:          Rest 10 minutes before taking your blood pressure.          Don't smoke or drink caffeinated beverages for at least 30 minutes before.          Take your blood pressure before (not after) you eat.          Sit comfortably with your back supported and both feet on the floor (don't cross your legs).          Elevate your arm to heart level on a table or  a desk.          Use the proper sized cuff. It should fit smoothly and snugly around your bare upper arm. There should be enough room to slip a fingertip under the cuff. The bottom edge of the cuff should be 1 inch above the crease of the elbow.     Signed, Sanda Klein, MD  12/07/2021 5:14 PM    [facility]

## 2021-12-07 NOTE — Patient Instructions (Addendum)
Medication Instructions:  START Amlodipine 5 mg once daily  Continue the Losartan 100 mg once daily  INCREASE the Metoprolol Tartrate to 75 mg twice daily (A tablet and a half of the 50 mg tablet)  CHANGE how you take the Hydralazine to as needed up to three times daily for systolic (top number) over 180  *If you need a refill on your cardiac medications before your next appointment, please call your pharmacy*   Lab Work: None ordered If you have labs (blood work) drawn today and your tests are completely normal, you will receive your results only by: [place] (if you have MyChart) OR A paper copy in the mail If you have any lab test that is abnormal or we need to change your treatment, we will call you to review the results.   Testing/Procedures: Your physician has requested that you have an echocardiogram in June 2023. Echocardiography is a painless test that uses sound waves to create images of your heart. It provides your doctor with information about the size and shape of your heart and how well your hearts chambers and valves are working. You may receive an ultrasound enhancing agent through an IV if needed to better visualize your heart during the echo.This procedure takes approximately one hour. There are no restrictions for this procedure. This will take place at the [address].     Follow-Up: At [facility], you and your health needs are our priority.  As part of our continuing mission to provide you with exceptional heart care, we have created designated Provider Care Teams.  These Care Teams include your primary Cardiologist (physician) and Advanced Practice Providers (APPs -  Physician Assistants and Nurse Practitioners) who all work together to provide you with the care you need, when you need it.  We recommend signing up for the patient portal called "MyChart".  Sign up information is provided on this After Visit Summary.  MyChart is used to  connect with patients for Virtual Visits (Telemedicine).  Patients are able to view lab/test results, encounter notes, upcoming appointments, etc.  Non-urgent messages can be sent to your provider as well.   To learn more about what you can do with MyChart, go to [email or web address].    Your next appointment:   Follow up with Dr. Sallyanne Kuster in June 2023 after the echo Follow up first available with an APP   Other Instructions Dr. Sallyanne Kuster would like you to check your blood pressure daily for the next 2 weeks.  Keep a journal of these daily blood pressure and heart rate readings and call our office or send a message through [place] with the results. Thank you!  It is best to check your BP 1-2 hours after taking your medications to see the medications effectiveness on your BP.    Here are some tips that our clinical pharmacists share for home BP monitoring:          Rest 10 minutes before taking your blood pressure.          Don't smoke or drink caffeinated beverages for at least 30 minutes before.          Take your blood pressure before (not after) you eat.          Sit comfortably with your back supported and both feet on the floor (don't cross your legs).          Elevate your arm to heart level on a table or a desk.  Use the proper sized cuff. It should fit smoothly and snugly around your bare upper arm. There should be enough room to slip a fingertip under the cuff. The bottom edge of the cuff should be 1 inch above the crease of the elbow.

## 2021-12-12 ENCOUNTER — Telehealth: Payer: Self-pay | Admitting: *Deleted

## 2021-12-12 NOTE — Chronic Care Management (AMB) (Signed)
°  Care Management   Note  12/12/2021 Name: [name] [name] MRN: [id number] DOB: [birth year]  [name] [name] is a 70 y.o. year old female who is a primary care patient of Lindell Spar, MD and is actively engaged with the care management team. I reached out to [name] by phone today to assist with re-scheduling an initial visit with the Pharmacist  Follow up plan: Unsuccessful telephone outreach attempt made. A HIPAA compliant phone message was left for the patient providing contact information and requesting a return call.  The care management team will reach out to the patient again over the next 7 days.  If patient returns call to provider office, please advise to call [place] at [phone].  [facility]  Direct Dial: [phone]

## 2021-12-13 ENCOUNTER — Other Ambulatory Visit: Payer: Self-pay | Admitting: Internal Medicine

## 2021-12-13 DIAGNOSIS — I10 Essential (primary) hypertension: Secondary | ICD-10-CM

## 2021-12-15 NOTE — Chronic Care Management (AMB) (Signed)
°  Care Management   Note  12/15/2021 Name: [name] [name] MRN: [id number] DOB: 05-14-1951  [name] [name] is a 70 y.o. year old female who is a primary care patient of Lindell Spar, MD and is actively engaged with the care management team. I reached out to [name] by phone today to assist with re-scheduling an initial visit with the Pharmacist  Follow up plan: Face to Face appointment with care management team member scheduled for: 01/09/22  [facility]  Direct Dial: [phone]

## 2022-01-01 ENCOUNTER — Ambulatory Visit: Payer: Medicare HMO | Admitting: Internal Medicine

## 2022-01-09 ENCOUNTER — Other Ambulatory Visit: Payer: Self-pay | Admitting: Internal Medicine

## 2022-01-09 ENCOUNTER — Ambulatory Visit: Payer: Medicare HMO

## 2022-01-09 DIAGNOSIS — I10 Essential (primary) hypertension: Secondary | ICD-10-CM

## 2022-01-18 ENCOUNTER — Telehealth: Payer: Self-pay | Admitting: *Deleted

## 2022-01-18 NOTE — Chronic Care Management (AMB) (Signed)
°  Care Management   Note  01/18/2022 Name: [name] MRN: [id number] DOB: [birth year]  [name] [name] is a 71 y.o. year old female who is a primary care patient of Lindell Spar, MD and is actively engaged with the care management team. I reached out to [name] by phone today to assist with re-scheduling an initial visit with the Pharmacist  Follow up plan: Unsuccessful telephone outreach attempt made. A HIPAA compliant phone message was left for the patient providing contact information and requesting a return call.  The care management team will reach out to the patient again over the next 7 days.  If patient returns call to provider office, please advise to call [place] at [place] [facility]  Direct Dial: [phone]

## 2022-01-21 ENCOUNTER — Other Ambulatory Visit: Payer: Self-pay | Admitting: Internal Medicine

## 2022-01-21 DIAGNOSIS — I10 Essential (primary) hypertension: Secondary | ICD-10-CM

## 2022-01-24 NOTE — Chronic Care Management (AMB) (Signed)
°  Care Management   Note  01/24/2022 Name: [name] [name] MRN: [id number] DOB: 1951/06/02  [name] is a 71 y.o. year old female who is a primary care patient of Lindell Spar, MD and is actively engaged with the care management team. I reached out to [name] by phone today to assist with re-scheduling an initial visit with the Pharmacist  Follow up plan: Unsuccessful telephone outreach attempt made. A HIPAA compliant phone message was left for the patient providing contact information and requesting a return call.  The care management team will reach out to the patient again over the next 7 days.  If patient returns call to provider office, please advise to call [place] at [facility] [place] [facility]  Direct Dial: [phone]

## 2022-01-30 ENCOUNTER — Ambulatory Visit: Payer: Medicare HMO | Admitting: Physician Assistant

## 2022-01-30 ENCOUNTER — Encounter: Payer: Self-pay | Admitting: Physician Assistant

## 2022-01-30 ENCOUNTER — Other Ambulatory Visit: Payer: Self-pay

## 2022-01-30 VITALS — BP 178/64 | HR 65 | Ht 63.0 in | Wt 148.2 lb

## 2022-01-30 DIAGNOSIS — R188 Other ascites: Secondary | ICD-10-CM | POA: Diagnosis not present

## 2022-01-30 DIAGNOSIS — I10 Essential (primary) hypertension: Secondary | ICD-10-CM | POA: Diagnosis not present

## 2022-01-30 DIAGNOSIS — K7460 Unspecified cirrhosis of liver: Secondary | ICD-10-CM | POA: Diagnosis not present

## 2022-01-30 DIAGNOSIS — I350 Nonrheumatic aortic (valve) stenosis: Secondary | ICD-10-CM

## 2022-01-30 NOTE — Patient Instructions (Signed)
Medication Instructions:  Increase Lopressor to 100 mg twice daily (take 2 pills twice daily).   *If you need a refill on your cardiac medications before your next appointment, please call your pharmacy*   Lab Work: NONE ordered at this time of appointment   Testing/Procedures: NONE ordered at this time of appointment   Follow-Up: At [facility], you and your health needs are our priority.  As part of our continuing mission to provide you with exceptional heart care, we have created designated Provider Care Teams.  These Care Teams include your primary Cardiologist (physician) and Advanced Practice Providers (APPs -  Physician Assistants and Nurse Practitioners) who all work together to provide you with the care you need, when you need it.  We recommend signing up for the patient portal called "MyChart".  Sign up information is provided on this After Visit Summary.  MyChart is used to connect with patients for Virtual Visits (Telemedicine).  Patients are able to view lab/test results, encounter notes, upcoming appointments, etc.  Non-urgent messages can be sent to your provider as well.   To learn more about what you can do with MyChart, go to [email or web address].    Your next appointment:   2-3 week(s)  The format for your next appointment:   In Person  Provider:   Almyra Deforest, PA-C        Other Instructions Bring daily blood pressure and all medication bottles to your follow up appointment.

## 2022-01-30 NOTE — Progress Notes (Signed)
Cardiology Office Note:    Date:  02/01/2022   ID:  [name] [name], DOB 29-Sep-1951, MRN [id number]  PCP:  Lindell Spar, MD   [facility] Providers Cardiologist:  Sanda Klein, MD     Referring MD: Lindell Spar, MD   Chief Complaint  Patient presents with   Follow-up    Seen for Dr. Sallyanne Kuster    History of Present Illness:    [name] [name] is a 71 y.o. female with a hx of aortic stenosis, cirrhosis with esophageal varices, portal hypertension, splenomegaly, thrombocytopenia, probable hepatic encephalopathy, chronic diarrhea, aortic atherosclerosis of abdominal aorta, hypertension, DM2, hyperlipidemia and MGUS.  Patient had a normal nuclear stress test in 2010.  Echocardiogram in 2019 showed aortic valve sclerosis with mild insufficiency but no stenosis, EF 60 to 65%.  Repeat echocardiogram in June 2022 showed moderate aortic stenosis, mean transvalvular gradient 20 mmHg.  She takes atenolol for palpitation, this was later switched to metoprolol.  Metoprolol later stopped providing relief either.  She was seen in the gallbladder ED on 11/29/2021 for elevated blood pressure.  She was given hydralazine 25 mg 3 times a day, however she has difficulty taking 3 times a day doses.  She misunderstood the instruction and stopped the losartan when she started taking the hydralazine.  Spironolactone was later started to help with the blood pressure.  This medication was later stopped.  She had a cough with lisinopril.  Patient was recently seen by Dr. Sallyanne Kuster in December 2022 at which time she did not have any symptoms associated with aortic stenosis.  The plan was to repeat echocardiogram in 1 year.  Amlodipine 5 mg was added to her medical regimen.  Metoprolol was increased to 75 mg twice a day.  Patient presents today for follow-up.  Unfortunately she does have fatigue.  She takes her amlodipine, losartan, and metoprolol 75 mg twice a day.  Strangely, her blood pressure is higher today  compared to her previous visit.  She says she feels fatigued all the time.  With her elevated blood pressure, I decided to do a manual recheck and it was still 176/66.  She says she take majority of her medication in the morning and the metoprolol is only one that is twice a day.  I asked her to do a trial on higher dose of metoprolol 100 mg twice a day, if her fatigue worsens on this higher dose, we will scale back.  I will check with Dr. Sallyanne Kuster to see if we can potentially switch his losartan to irbesartan which is a better blood pressure medication.  With her history of possible liver cirrhosis, propranolol may be a better choice, prior to metoprolol.  Past Medical History:  Diagnosis Date   Allergy    Anemia, unspecified    Anxiety    Arthritis    Chronic diarrhea    Cirrhosis (HCC)    Depression    Depression    Elevated cholesterol    Enterocolitis due to Clostridium difficile, not specified as recurrent    Gastro-esophageal reflux disease with esophagitis    Helicobacter pylori gastritis 07/10/2017   Dx EGD -  1) Omeprazole 20 mg 2 times a day x 14 d 2) Pepto Bismol 2 tabs (262 mg each) 4 times a day x 14 d 3) Metronidazole 250 mg 4 times a day x 14 d 4) doxycycline 100 mg 2 times a day x 14 d  After 14 d stop omeprazole also  In 4  weeks after treatment completed do H. Pylori stool antigen - dx H. Pylori gastritis    Hx of adenomatous colonic polyps 10/05/2015   Hyperlipemia 02/21/2009   NUC STRESS TEST-NORMAL- EF 74%   Hypertension    Iron deficiency anemia    Irritable bowel syndrome    Localized edema    Major depressive disorder, single episode, unspecified    MVP (mitral valve prolapse)    Rectocele    Thrombocytopenia (HCC)    Type 2 diabetes mellitus with hyperglycemia (HCC)     Past Surgical History:  Procedure Laterality Date   ABDOMINAL HYSTERECTOMY     BILATERAL SALPINGOOPHORECTOMY     carpel tunnel Bilateral 12/17/2009   COLONOSCOPY      ESOPHAGOGASTRODUODENOSCOPY     TUBAL LIGATION      Current Medications: Current Meds  Medication Sig   amLODipine (NORVASC) 5 MG tablet Take 1 tablet (5 mg total) by mouth daily.   BIOTIN PO Take 500 mg by mouth daily.    ezetimibe (ZETIA) 10 MG tablet Take 1 tablet (10 mg total) by mouth daily.   ferrous sulfate 325 (65 FE) MG tablet Take 325 mg by mouth daily with breakfast.   glipiZIDE (GLUCOTROL) 10 MG tablet Take 10 mg by mouth daily before breakfast. 1 Tablet Daily Before Breakfast   Insulin Pen Needle 32G X 4 MM MISC 1 each by Does not apply route 4 (four) times daily.   metoprolol tartrate (LOPRESSOR) 50 MG tablet TAKE 1 TABLET BY MOUTH TWICE A DAY (STOP ATENOLOL)\   rosuvastatin (CRESTOR) 5 MG tablet Take 5 mg by mouth daily. Takes one on Monday, Wed., and Friday   triamcinolone cream (KENALOG) 0.1 % APPLY TO AFFECTED AREA TWICE A DAY   venlafaxine (EFFEXOR) 37.5 MG tablet TAKE 1 TABLET BY MOUTH 2 TIMES DAILY.   [DISCONTINUED] losartan (COZAAR) 100 MG tablet TAKE 1 TABLET BY MOUTH EVERY DAY   Current Facility-Administered Medications for the 01/30/22 encounter (Office Visit) with Almyra Deforest, PA  Medication   0.9 %  sodium chloride infusion     Allergies:   Codeine and Spironolactone   Social History   Socioeconomic History   Marital status: Married    Spouse name: Louie Casa   Number of children: 2   Years of education: Not on file   Highest education level: Not on file  Occupational History   Not on file  Tobacco Use   Smoking status: Former    Years: 30.00    Types: Cigarettes    Quit date: 09/03/1997    Years since quitting: 24.4   Smokeless tobacco: Current    Types: Snuff   Tobacco comments:    call when ready to quit  Vaping Use   Vaping Use: Never used  Substance and Sexual Activity   Alcohol use: No    Alcohol/week: 0.0 standard drinks   Drug use: No   Sexual activity: Not on file  Other Topics Concern   Not on file  Social History Narrative   Married  2 sons 1 lives in [place] 1 lives in [place]   She is a housewife   No alcohol tobacco or drug use at this time   Former smoker quit in [place] Resource Strain: Low Risk    Difficulty of Paying Living Expenses: Not hard at all  Food Insecurity: No Food Insecurity   Worried About Charity fundraiser in the Last Year: Never true  Ran Out of Food in the Last Year: Never true  Transportation Needs: No Transportation Needs   Lack of Transportation (Medical): No   Lack of Transportation (Non-Medical): No  Physical Activity: Sufficiently Active   Days of Exercise per Week: 5 days   Minutes of Exercise per Session: 30 min  Stress: No Stress Concern Present   Feeling of Stress : Not at all  Social Connections: Moderately Integrated   Frequency of Communication with Friends and Family: More than three times a week   Frequency of Social Gatherings with Friends and Family: More than three times a week   Attends Religious Services: More than 4 times per year   Active Member of [employer] or Organizations: No   Attends Archivist Meetings: Never   Marital Status: Married     Family History: The patient's family history includes ADD / ADHD in her brother; Alcohol abuse in her brother; Anxiety disorder in her maternal aunt, maternal grandmother, and mother; Bipolar disorder in an other family member; Cirrhosis in her sister; Diabetes in her maternal uncle, sister, and sister; Drug abuse in her brother; Emphysema in her father; Hyperlipidemia in her sister and sister. There is no history of Dementia, OCD, Paranoid behavior, Physical abuse, Schizophrenia, Seizures, Sexual abuse, Colon cancer, Colon polyps, Pancreatic cancer, Esophageal cancer, Stomach cancer, Kidney disease, Liver disease, or Rectal cancer.  ROS:   Please see the history of present illness.     All other systems reviewed and are negative.  EKGs/Labs/Other Studies Reviewed:     The following studies were reviewed today:  Echo 06/14/2021  1. Left ventricular ejection fraction, by estimation, is 55 to 60%. The  left ventricle has normal function. The left ventricle has no regional  wall motion abnormalities. Left ventricular diastolic parameters are  indeterminate.   2. Right ventricular systolic function is normal. The right ventricular  size is normal. There is normal pulmonary artery systolic pressure. The  estimated right ventricular systolic pressure is 00.9 mmHg.   3. Left atrial size was upper normal.   4. The pericardial effusion is posterior to the left ventricle.   5. The mitral valve is grossly normal. Mild mitral valve regurgitation.   6. The aortic valve is tricuspid with reduced excursion of the righr and  noncoronary cusps predominantly. There is mild calcification of the aortic  valve. There is mild thickening of the aortic valve. Aortic valve  regurgitation is mild. Moderate to  severe, relatively low gradient aortic valve stenosis. Aortic  regurgitation PHT measures 800 msec. Aortic valve mean gradient measures  19.7 mmHg. Dimentionless index 0.40.   7. The inferior vena cava is normal in size with greater than 50%  respiratory variability, suggesting right atrial pressure of 3 mmHg.   EKG:  EKG is not ordered today.    Recent Labs: 09/05/2021: ALT 18 11/29/2021: BUN 15; Creatinine, Ser 0.75; Hemoglobin 13.3; Platelets 74; Potassium 3.7; Sodium 141  Recent Lipid Panel    Component Value Date/Time   CHOL 214 (H) 09/05/2021 0845   TRIG 93 09/05/2021 0845   HDL 54 09/05/2021 0845   CHOLHDL 4.0 09/05/2021 0845   CHOLHDL 2.9 04/16/2018 0732   VLDL 27 09/13/2014 0730   LDLCALC 143 (H) 09/05/2021 0845   LDLCALC 105 (H) 04/16/2018 0732     Risk Assessment/Calculations:           Physical Exam:    VS:  BP (!) 178/64    Pulse 65    Ht 5'  3" (1.6 m)    Wt 148 lb 3.2 oz (67.2 kg)    SpO2 97%    BMI 26.25 kg/m     Wt Readings from  Last 3 Encounters:  01/30/22 148 lb 3.2 oz (67.2 kg)  12/07/21 144 lb (65.3 kg)  10/31/21 140 lb 1.9 oz (63.6 kg)     GEN:  Well nourished, well developed in no acute distress HEENT: Normal NECK: No JVD; No carotid bruits LYMPHATICS: No lymphadenopathy CARDIAC: RRR, no murmurs, rubs, gallops RESPIRATORY:  Clear to auscultation without rales, wheezing or rhonchi  ABDOMEN: Soft, non-tender, non-distended MUSCULOSKELETAL:  No edema; No deformity  SKIN: Warm and dry NEUROLOGIC:  Alert and oriented x 3 PSYCHIATRIC:  Normal affect   ASSESSMENT:    1. Essential hypertension, benign   2. Aortic stenosis, moderate   3. Cirrhosis of liver with ascites, unspecified hepatic cirrhosis type ([place])    PLAN:    In order of problems listed above:  Hypertension: She previously took atenolol for palpitation however later switched to metoprolol.  During the previous visit, amlodipine was added to her medical regimen, metoprolol was increased to 75 mg twice a day.  She says she is compliant with her medication, however her blood pressure is higher today than her previous visit.  I instructed her to increase metoprolol to 100 mg twice daily as a trial.  I am concerned that this may ended up worsening her baseline fatigue.  Alternative would be to switch her metoprolol to propranolol which may be more ideal given her history of cirrhosis.  And may be also prudent for [place] to switch her losartan to irbesartan which is a better blood pressure medication.  Aortic stenosis: Moderate aortic stenosis seen on previous echocardiogram in June 2022.  Plan to repeat echocardiogram around June of this year  History of cirrhosis: With esophageal varices.           Medication Adjustments/Labs and Tests Ordered: Current medicines are reviewed at length with the patient today.  Concerns regarding medicines are outlined above.  No orders of the defined types were placed in this encounter.  No orders of the defined  types were placed in this encounter.   Patient Instructions  Medication Instructions:  Increase Lopressor to 100 mg twice daily (take 2 pills twice daily).   *If you need a refill on your cardiac medications before your next appointment, please call your pharmacy*   Lab Work: NONE ordered at this time of appointment   Testing/Procedures: NONE ordered at this time of appointment   Follow-Up: At [facility], you and your health needs are our priority.  As part of our continuing mission to provide you with exceptional heart care, we have created designated Provider Care Teams.  These Care Teams include your primary Cardiologist (physician) and Advanced Practice Providers (APPs -  Physician Assistants and Nurse Practitioners) who all work together to provide you with the care you need, when you need it.  We recommend signing up for the patient portal called "MyChart".  Sign up information is provided on this After Visit Summary.  MyChart is used to connect with patients for Virtual Visits (Telemedicine).  Patients are able to view lab/test results, encounter notes, upcoming appointments, etc.  Non-urgent messages can be sent to your provider as well.   To learn more about what you can do with MyChart, go to [email or web address].    Your next appointment:   2-3 week(s)  The format for your next appointment:  In Person  Provider:   Almyra Deforest, PA-C        Other Instructions Bring daily blood pressure and all medication bottles to your follow up appointment.    Hilbert Corrigan, [place]  02/01/2022 11:16 PM    [facility]

## 2022-01-31 NOTE — Chronic Care Management (AMB) (Signed)
°  Care Management   Note  01/31/2022 Name: [name] [name] MRN: [id number] DOB: 08-04-1951  [name] [name] is a 71 y.o. year old female who is a primary care patient of Lindell Spar, MD and is actively engaged with the care management team. I reached out to [name] by phone today to assist with re-scheduling an initial visit with the Pharmacist  Follow up plan: A unsuccessful telephone outreach attempt made. A HIPAA compliant phone message was left for the patient providing contact information and requesting a return call. Unable to make contact on outreach attempts x 3. PCP Dr Posey Pronto. notified via routed documentation in medical record. We have been unable to make contact with the patient for follow up. The care management team is available to follow up with the patient after provider conversation with the patient regarding recommendation for care management engagement and subsequent re-referral to the care management team.   [facility]  Direct Dial: [phone]

## 2022-02-01 ENCOUNTER — Encounter: Payer: Self-pay | Admitting: Physician Assistant

## 2022-02-01 ENCOUNTER — Other Ambulatory Visit: Payer: Self-pay | Admitting: Internal Medicine

## 2022-02-01 DIAGNOSIS — I10 Essential (primary) hypertension: Secondary | ICD-10-CM

## 2022-02-13 ENCOUNTER — Other Ambulatory Visit: Payer: Self-pay | Admitting: Internal Medicine

## 2022-02-13 DIAGNOSIS — I10 Essential (primary) hypertension: Secondary | ICD-10-CM

## 2022-02-14 ENCOUNTER — Other Ambulatory Visit: Payer: Self-pay | Admitting: Internal Medicine

## 2022-02-14 DIAGNOSIS — I10 Essential (primary) hypertension: Secondary | ICD-10-CM

## 2022-02-14 MED ORDER — LOSARTAN POTASSIUM 100 MG PO TABS: 100.0000 mg | ORAL_TABLET | Freq: Every day | ORAL | 1 refills | Status: DC

## 2022-02-20 ENCOUNTER — Ambulatory Visit: Payer: Medicare HMO | Admitting: Internal Medicine

## 2022-02-21 ENCOUNTER — Ambulatory Visit: Payer: Medicare HMO | Admitting: Physician Assistant

## 2022-02-21 ENCOUNTER — Other Ambulatory Visit: Payer: Self-pay

## 2022-02-21 ENCOUNTER — Encounter: Payer: Self-pay | Admitting: Physician Assistant

## 2022-02-21 VITALS — BP 130/60 | HR 55 | Ht 63.0 in | Wt 144.2 lb

## 2022-02-21 DIAGNOSIS — R002 Palpitations: Secondary | ICD-10-CM

## 2022-02-21 DIAGNOSIS — E782 Mixed hyperlipidemia: Secondary | ICD-10-CM

## 2022-02-21 DIAGNOSIS — E119 Type 2 diabetes mellitus without complications: Secondary | ICD-10-CM

## 2022-02-21 DIAGNOSIS — K7460 Unspecified cirrhosis of liver: Secondary | ICD-10-CM | POA: Diagnosis not present

## 2022-02-21 DIAGNOSIS — R188 Other ascites: Secondary | ICD-10-CM | POA: Diagnosis not present

## 2022-02-21 DIAGNOSIS — I350 Nonrheumatic aortic (valve) stenosis: Secondary | ICD-10-CM | POA: Diagnosis not present

## 2022-02-21 DIAGNOSIS — I10 Essential (primary) hypertension: Secondary | ICD-10-CM | POA: Diagnosis not present

## 2022-02-21 MED ORDER — CARVEDILOL 25 MG PO TABS: 25.0000 mg | ORAL_TABLET | Freq: Two times a day (BID) | ORAL | 3 refills | Status: DC

## 2022-02-21 NOTE — Patient Instructions (Signed)
Medication Instructions:  ?STOP Metoprolol Tartrate  ?START Carvedilol 25 mg twice daily (you can start tonight) ? ?*If you need a refill on your cardiac medications before your next appointment, please call your pharmacy* ? ? ?Follow-Up: ?At [facility], you and your health needs are our priority.  As part of our continuing mission to provide you with exceptional heart care, we have created designated Provider Care Teams.  These Care Teams include your primary Cardiologist (physician) and Advanced Practice Providers (APPs -  Physician Assistants and Nurse Practitioners) who all work together to provide you with the care you need, when you need it. ? ?We recommend signing up for the patient portal called "MyChart".  Sign up information is provided on this After Visit Summary.  MyChart is used to connect with patients for Virtual Visits (Telemedicine).  Patients are able to view lab/test results, encounter notes, upcoming appointments, etc.  Non-urgent messages can be sent to your provider as well.   ?To learn more about what you can do with MyChart, go to [email or web address].   ? ?Your next appointment:   ?1 month(s) ? ?The format for your next appointment:   ?In Person ? ?Provider:   ?Almyra Deforest, PA-C    Then, Sanda Klein, MD will plan to see you again in 3 month(s).  ? ? ? ?

## 2022-02-21 NOTE — Progress Notes (Signed)
Cardiology Office Note:    Date:  02/23/2022   ID:  ABEGAIL [name], DOB [birth year], MRN [id number]  PCP:  Lindell Spar, MD   [facility] Providers Cardiologist:  Sanda Klein, MD     Referring MD: Lindell Spar, MD   Chief Complaint  Patient presents with   Follow-up   Hypertension    History of Present Illness:    [name] [name] is a 71 y.o. female with a hx of aortic stenosis, cirrhosis with esophageal varices, portal hypertension, splenomegaly, thrombocytopenia, probable hepatic encephalopathy, chronic diarrhea, aortic atherosclerosis of abdominal aorta, HTN, HLD, DM2 and MGUS.  Patient had a normal nuclear stress test in 2010.  Echocardiogram in 2019 showed aortic valve sclerosis with mild insufficiency but no stenosis, EF 60 to 65%.  Repeat echocardiogram in June 2022 showed moderate aortic stenosis, mean transvalvular gradient 20 mmHg.  She takes atenolol for palpitation, this was later switched to metoprolol.  Metoprolol later stopped providing relief either.  She was seen in the gallbladder ED on 11/29/2021 for elevated blood pressure.  She was given hydralazine 25 mg 3 times a day, however she has difficulty taking 3 times a day doses.  She misunderstood the instruction and stopped the losartan when she started taking the hydralazine.  Spironolactone was later started to help with the blood pressure.  This medication was later stopped.  She had a cough with lisinopril.  Patient was recently seen by Dr. Sallyanne Kuster in December 2022 at which time she did not have any symptoms associated with aortic stenosis.  The plan was to repeat echocardiogram in 1 year.  Amlodipine 5 mg was added to her medical regimen.  Metoprolol was increased to 75 mg twice a day.  I last saw the patient on 01/30/2022, at which time she continued to have fatigue.  Her blood pressure was higher on that visit, prior to her previous visit, and that she felt fatigued all the time.  As a trial, I increased  the dose of metoprolol to 100 mg twice a day.  Unfortunately, she has significant fatigue on the metoprolol, her blood pressure remains elevated between 140-170s.  Manual check by myself today was 144/60.  She previously tried spironolactone which was discontinued on 10/06/2021 by PCP due to intolerance.  Patient does not remember the side effect of spironolactone.  I discussed the case with Dr. Sallyanne Kuster, we will switch her to carvedilol 25 mg twice a day to help control her blood pressure and also for her history of cirrhosis.  She can follow-up in 1 month with me and 3 months with Dr. Sallyanne Kuster.   Past Medical History:  Diagnosis Date   Allergy    Anemia, unspecified    Anxiety    Arthritis    Chronic diarrhea    Cirrhosis (HCC)    Depression    Depression    Elevated cholesterol    Enterocolitis due to Clostridium difficile, not specified as recurrent    Gastro-esophageal reflux disease with esophagitis    Helicobacter pylori gastritis 07/10/2017   Dx EGD -  1) Omeprazole 20 mg 2 times a day x 14 d 2) Pepto Bismol 2 tabs (262 mg each) 4 times a day x 14 d 3) Metronidazole 250 mg 4 times a day x 14 d 4) doxycycline 100 mg 2 times a day x 14 d  After 14 d stop omeprazole also  In 4 weeks after treatment completed do H. Pylori stool antigen - dx H. Pylori  gastritis    Hx of adenomatous colonic polyps 10/05/2015   Hyperlipemia 02/21/2009   NUC STRESS TEST-NORMAL- EF 74%   Hypertension    Iron deficiency anemia    Irritable bowel syndrome    Localized edema    Major depressive disorder, single episode, unspecified    MVP (mitral valve prolapse)    Rectocele    Thrombocytopenia (HCC)    Type 2 diabetes mellitus with hyperglycemia (HCC)     Past Surgical History:  Procedure Laterality Date   ABDOMINAL HYSTERECTOMY     BILATERAL SALPINGOOPHORECTOMY     carpel tunnel Bilateral 12/17/2009   COLONOSCOPY     ESOPHAGOGASTRODUODENOSCOPY     TUBAL LIGATION      Current  Medications: Current Meds  Medication Sig   amLODipine (NORVASC) 5 MG tablet Take 1 tablet (5 mg total) by mouth daily.   carvedilol (COREG) 25 MG tablet Take 1 tablet (25 mg total) by mouth 2 (two) times daily.   ferrous sulfate 325 (65 FE) MG tablet Take 325 mg by mouth daily with breakfast.   glipiZIDE (GLUCOTROL) 10 MG tablet Take 10 mg by mouth daily before breakfast. 1 Tablet Daily Before Breakfast   insulin glargine, 1 Unit Dial, (TOUJEO SOLOSTAR) 300 UNIT/ML Solostar Pen Inject 60 Units into the skin 1 day or 1 dose for 1 dose.   Insulin Pen Needle 32G X 4 MM MISC 1 each by Does not apply route 4 (four) times daily.   losartan (COZAAR) 100 MG tablet Take 1 tablet (100 mg total) by mouth daily.   rosuvastatin (CRESTOR) 5 MG tablet Take 5 mg by mouth daily. Takes one on Monday, Wed., and Friday   venlafaxine (EFFEXOR) 37.5 MG tablet TAKE 1 TABLET BY MOUTH 2 TIMES DAILY.   [DISCONTINUED] metoprolol tartrate (LOPRESSOR) 50 MG tablet TAKE 1 TABLET BY MOUTH TWICE A DAY (STOP ATENOLOL)\   Current Facility-Administered Medications for the 02/21/22 encounter (Office Visit) with Almyra Deforest, PA  Medication   0.9 %  sodium chloride infusion     Allergies:   Codeine and Spironolactone   Social History   Socioeconomic History   Marital status: Married    Spouse name: Louie Casa   Number of children: 2   Years of education: Not on file   Highest education level: Not on file  Occupational History   Not on file  Tobacco Use   Smoking status: Former    Years: 30.00    Types: Cigarettes    Quit date: 09/03/1997    Years since quitting: 24.4   Smokeless tobacco: Current    Types: Snuff   Tobacco comments:    call when ready to quit  Vaping Use   Vaping Use: Never used  Substance and Sexual Activity   Alcohol use: No    Alcohol/week: 0.0 standard drinks   Drug use: No   Sexual activity: Not on file  Other Topics Concern   Not on file  Social History Narrative   Married 2 sons 1 lives in  [place] 1 lives in [place]   She is a housewife   No alcohol tobacco or drug use at this time   Former smoker quit in [place] Resource Strain: Low Risk    Difficulty of Paying Living Expenses: Not hard at all  Food Insecurity: No Food Insecurity   Worried About Charity fundraiser in the Last Year: Never true   [place]  in the Last Year: Never true  Transportation Needs: No Transportation Needs   Lack of Transportation (Medical): No   Lack of Transportation (Non-Medical): No  Physical Activity: Sufficiently Active   Days of Exercise per Week: 5 days   Minutes of Exercise per Session: 30 min  Stress: No Stress Concern Present   Feeling of Stress : Not at all  Social Connections: Moderately Integrated   Frequency of Communication with Friends and Family: More than three times a week   Frequency of Social Gatherings with Friends and Family: More than three times a week   Attends Religious Services: More than 4 times per year   Active Member of [employer] or Organizations: No   Attends Archivist Meetings: Never   Marital Status: Married     Family History: The patient's family history includes ADD / ADHD in her brother; Alcohol abuse in her brother; Anxiety disorder in her maternal aunt, maternal grandmother, and mother; Bipolar disorder in an other family member; Cirrhosis in her sister; Diabetes in her maternal uncle, sister, and sister; Drug abuse in her brother; Emphysema in her father; Hyperlipidemia in her sister and sister. There is no history of Dementia, OCD, Paranoid behavior, Physical abuse, Schizophrenia, Seizures, Sexual abuse, Colon cancer, Colon polyps, Pancreatic cancer, Esophageal cancer, Stomach cancer, Kidney disease, Liver disease, or Rectal cancer.  ROS:   Please see the history of present illness.     All other systems reviewed and are negative.  EKGs/Labs/Other Studies Reviewed:    The following  studies were reviewed today:  Echo 06/14/2021  1. Left ventricular ejection fraction, by estimation, is 55 to 60%. The  left ventricle has normal function. The left ventricle has no regional  wall motion abnormalities. Left ventricular diastolic parameters are  indeterminate.   2. Right ventricular systolic function is normal. The right ventricular  size is normal. There is normal pulmonary artery systolic pressure. The  estimated right ventricular systolic pressure is 37.3 mmHg.   3. Left atrial size was upper normal.   4. The pericardial effusion is posterior to the left ventricle.   5. The mitral valve is grossly normal. Mild mitral valve regurgitation.   6. The aortic valve is tricuspid with reduced excursion of the righr and  noncoronary cusps predominantly. There is mild calcification of the aortic  valve. There is mild thickening of the aortic valve. Aortic valve  regurgitation is mild. Moderate to  severe, relatively low gradient aortic valve stenosis. Aortic  regurgitation PHT measures 800 msec. Aortic valve mean gradient measures  19.7 mmHg. Dimentionless index 0.40.   7. The inferior vena cava is normal in size with greater than 50%  respiratory variability, suggesting right atrial pressure of 3 mmHg.   EKG:  EKG is not ordered today.    Recent Labs: 09/05/2021: ALT 18 11/29/2021: BUN 15; Creatinine, Ser 0.75; Hemoglobin 13.3; Platelets 74; Potassium 3.7; Sodium 141  Recent Lipid Panel    Component Value Date/Time   CHOL 214 (H) 09/05/2021 0845   TRIG 93 09/05/2021 0845   HDL 54 09/05/2021 0845   CHOLHDL 4.0 09/05/2021 0845   CHOLHDL 2.9 04/16/2018 0732   VLDL 27 09/13/2014 0730   LDLCALC 143 (H) 09/05/2021 0845   LDLCALC 105 (H) 04/16/2018 0732     Risk Assessment/Calculations:           Physical Exam:    VS:  BP 130/60    Pulse (!) 55    Ht '5\' 3"'$  (1.6 m)  Wt 144 lb 3.2 oz (65.4 kg)    SpO2 97%    BMI 25.54 kg/m     Wt Readings from Last 3 Encounters:   02/21/22 144 lb 3.2 oz (65.4 kg)  01/30/22 148 lb 3.2 oz (67.2 kg)  12/07/21 144 lb (65.3 kg)     GEN:  Well nourished, well developed in no acute distress HEENT: Normal NECK: No JVD; No carotid bruits LYMPHATICS: No lymphadenopathy CARDIAC: RRR, no murmurs, rubs, gallops RESPIRATORY:  Clear to auscultation without rales, wheezing or rhonchi  ABDOMEN: Soft, non-tender, non-distended MUSCULOSKELETAL:  No edema; No deformity  SKIN: Warm and dry NEUROLOGIC:  Alert and oriented x 3 PSYCHIATRIC:  Normal affect   ASSESSMENT:    1. Primary hypertension   2. Cirrhosis of liver with ascites, unspecified hepatic cirrhosis type ([place])   3. Mixed hyperlipidemia   4. Controlled type 2 diabetes mellitus without complication, without long-term current use of insulin (HCC)   5. Palpitations   6. Aortic stenosis, moderate    PLAN:    In order of problems listed above:  Uncontrolled hypertension: Blood pressure remained quite elevated based on home blood pressure diary.  She had a significant fatigue on the higher dose of metoprolol.  She is intolerant of spironolactone.  Given her history of possible cirrhosis, we will switch the metoprolol to carvedilol.  If blood pressure remains uncontrolled, will switch losartan to irbesartan.  History of cirrhosis: With esophageal varices  Hyperlipidemia: On Crestor  DM2: Managed by primary care provider.  Palpitation: Previously controlled on atenolol, however atenolol stopped working for her.  Later switched to metoprolol, however continued to have increased palpitation despite higher dose of metoprolol.  We will switch to carvedilol for palpitation and the blood pressure.  Moderate aortic stenosis: Plan repeat echocardiogram around June of this year.            Medication Adjustments/Labs and Tests Ordered: Current medicines are reviewed at length with the patient today.  Concerns regarding medicines are outlined above.  No orders of the  defined types were placed in this encounter.  Meds ordered this encounter  Medications   carvedilol (COREG) 25 MG tablet    Sig: Take 1 tablet (25 mg total) by mouth 2 (two) times daily.    Dispense:  180 tablet    Refill:  3    Patient Instructions  Medication Instructions:  STOP Metoprolol Tartrate  START Carvedilol 25 mg twice daily (you can start tonight)  *If you need a refill on your cardiac medications before your next appointment, please call your pharmacy*   Follow-Up: At [facility], you and your health needs are our priority.  As part of our continuing mission to provide you with exceptional heart care, we have created designated Provider Care Teams.  These Care Teams include your primary Cardiologist (physician) and Advanced Practice Providers (APPs -  Physician Assistants and Nurse Practitioners) who all work together to provide you with the care you need, when you need it.  We recommend signing up for the patient portal called "MyChart".  Sign up information is provided on this After Visit Summary.  MyChart is used to connect with patients for Virtual Visits (Telemedicine).  Patients are able to view lab/test results, encounter notes, upcoming appointments, etc.  Non-urgent messages can be sent to your provider as well.   To learn more about what you can do with MyChart, go to [email or web address].    Your next appointment:   1 month(s)  The  format for your next appointment:   In Person  Provider:   Almyra Deforest, PA-C    Then, Sanda Klein, MD will plan to see you again in 3 month(s).       Hilbert Corrigan, [place]  02/23/2022 5:00 PM    [place]

## 2022-02-23 ENCOUNTER — Encounter: Payer: Self-pay | Admitting: Physician Assistant

## 2022-03-05 ENCOUNTER — Other Ambulatory Visit: Payer: Self-pay | Admitting: Internal Medicine

## 2022-03-05 DIAGNOSIS — I10 Essential (primary) hypertension: Secondary | ICD-10-CM

## 2022-03-08 ENCOUNTER — Encounter: Payer: Self-pay | Admitting: Internal Medicine

## 2022-03-08 ENCOUNTER — Other Ambulatory Visit: Payer: Self-pay

## 2022-03-08 ENCOUNTER — Ambulatory Visit (INDEPENDENT_AMBULATORY_CARE_PROVIDER_SITE_OTHER): Payer: Medicare HMO | Admitting: Internal Medicine

## 2022-03-08 VITALS — BP 122/58 | HR 58 | Resp 16 | Ht 63.0 in | Wt 146.2 lb

## 2022-03-08 DIAGNOSIS — D696 Thrombocytopenia, unspecified: Secondary | ICD-10-CM | POA: Diagnosis not present

## 2022-03-08 DIAGNOSIS — F331 Major depressive disorder, recurrent, moderate: Secondary | ICD-10-CM

## 2022-03-08 DIAGNOSIS — K766 Portal hypertension: Secondary | ICD-10-CM

## 2022-03-08 DIAGNOSIS — F419 Anxiety disorder, unspecified: Secondary | ICD-10-CM | POA: Diagnosis not present

## 2022-03-08 DIAGNOSIS — I700 Atherosclerosis of aorta: Secondary | ICD-10-CM

## 2022-03-08 DIAGNOSIS — E162 Hypoglycemia, unspecified: Secondary | ICD-10-CM

## 2022-03-08 DIAGNOSIS — Z794 Long term (current) use of insulin: Secondary | ICD-10-CM | POA: Diagnosis not present

## 2022-03-08 DIAGNOSIS — R232 Flushing: Secondary | ICD-10-CM

## 2022-03-08 DIAGNOSIS — I10 Essential (primary) hypertension: Secondary | ICD-10-CM

## 2022-03-08 DIAGNOSIS — K3189 Other diseases of stomach and duodenum: Secondary | ICD-10-CM

## 2022-03-08 DIAGNOSIS — E1165 Type 2 diabetes mellitus with hyperglycemia: Secondary | ICD-10-CM

## 2022-03-08 MED ORDER — VENLAFAXINE HCL 75 MG PO TABS: 75.0000 mg | ORAL_TABLET | Freq: Two times a day (BID) | ORAL | 2 refills | Status: DC

## 2022-03-08 MED ORDER — GVOKE HYPOPEN 2-PACK 1 MG/0.2ML ~~LOC~~ SOAJ: 0.2000 mg | SUBCUTANEOUS | 11 refills | Status: DC | PRN

## 2022-03-08 NOTE — Assessment & Plan Note (Signed)
On Crestor BP WNL now 
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
## 2022-03-08 NOTE — Assessment & Plan Note (Signed)
Lab Results  ?Component Value Date  ? HGBA1C 7.5 (H) 09/05/2021  ? ?On Toujeo 60 mg qHS and Glipizide 10 mg QD ?Has had fluctuating blood glucose, does not check regularly - has had episodes of hypoglycemia up to 40s, advised to avoid skipping any meals, advised to check blood glucose at least twice in a day ?Prescribed Gvoke pen for episodes of hypoglycemia ?Did not tolerate Metformin in the past ?Advised to follow diabetic diet ?On ACEi and statin ?F/u CMP and lipid panel ?Diabetic eye exam: Advised to follow up with Ophthalmology for diabetic eye exam ? ?

## 2022-03-08 NOTE — Assessment & Plan Note (Addendum)
Uncontrolled ? ?Did not tolerate Zoloft - drowsiness and diarrhea ?Cymbalta was ineffective ?Did not like effect of Lexapro ?Was better with Effexor 37.5 mg twice daily for anxiety, depression and hot flashes -increased dose to 75 mg BID for now, may consider Vraylar if she has persistent uncontrolled depression ?Referred to [facility] therapy ?

## 2022-03-08 NOTE — Assessment & Plan Note (Signed)
Has seen heme-onc ?Could be due to underlying NASH and/or nutritional deficiencies ?Continue iron supplement for anemia ?No active signs of bleeding currently ?Check CBC ?

## 2022-03-08 NOTE — Assessment & Plan Note (Signed)
On Coreg now ?Followed by GI ?

## 2022-03-08 NOTE — Assessment & Plan Note (Signed)
BP Readings from Last 1 Encounters:  ?03/08/22 (!) 122/58  ? ?Well controlled with losartan, amlodipine and Coreg now ?Her insomnia and anxiety are contributing to HTN as well ?Counseled for compliance with the medications ?Advised DASH diet and moderate exercise/walking, at least 150 mins/week ? ?

## 2022-03-08 NOTE — Patient Instructions (Signed)
Please start taking Effexor 75 mg twice daily instead of 37.5 mg. ? ?Please continue to take other medications as prescribed. ? ?Please continue to follow low carb and low salt diet as prescribed. ?

## 2022-03-09 ENCOUNTER — Telehealth: Payer: Self-pay

## 2022-03-09 LAB — BASIC METABOLIC PANEL
BUN/Creatinine Ratio: 17 (ref 12–28)
BUN: 17 mg/dL (ref 8–27)
CO2: 21 mmol/L (ref 20–29)
Calcium: 8.9 mg/dL (ref 8.7–10.3)
Chloride: 110 mmol/L — ABNORMAL HIGH (ref 96–106)
Creatinine, Ser: 0.98 mg/dL (ref 0.57–1.00)
Glucose: 121 mg/dL — ABNORMAL HIGH (ref 70–99)
Potassium: 4.8 mmol/L (ref 3.5–5.2)
Sodium: 144 mmol/L (ref 134–144)
eGFR: 63 mL/min/{1.73_m2} (ref >59)

## 2022-03-09 LAB — CBC
Hematocrit: 34.2 % (ref 34.0–46.6)
Hemoglobin: 11.2 g/dL (ref 11.1–15.9)
MCH: 27.8 pg (ref 26.6–33.0)
MCHC: 32.7 g/dL (ref 31.5–35.7)
MCV: 85 fL (ref 79–97)
Platelets: 117 10*3/uL — ABNORMAL LOW (ref 150–450)
RBC: 4.03 x10E6/uL (ref 3.77–5.28)
RDW: 14.2 % (ref 11.7–15.4)
WBC: 5.5 10*3/uL (ref 3.4–10.8)

## 2022-03-09 LAB — HEMOGLOBIN A1C
Est. average glucose Bld gHb Est-mCnc: 105 mg/dL
Hgb A1c MFr Bld: 5.3 % (ref 4.8–5.6)

## 2022-03-09 NOTE — Telephone Encounter (Signed)
Patient returning call about lab results.  

## 2022-03-09 NOTE — Telephone Encounter (Signed)
Returned pt's call no answer left vm ?

## 2022-03-12 ENCOUNTER — Telehealth: Payer: Self-pay

## 2022-03-12 NOTE — Telephone Encounter (Signed)
Patient returning call lab results. ?

## 2022-03-12 NOTE — Telephone Encounter (Signed)
Pt advised with verbal understanding  °

## 2022-03-21 ENCOUNTER — Telehealth: Payer: Self-pay | Admitting: *Deleted

## 2022-03-21 NOTE — Chronic Care Management (AMB) (Signed)
?  Care Management  ? ?Outreach Note ? ?03/21/2022 ?Name: [name] [name] MRN: [id number] DOB: [birth year] ? ?Referred by: Lindell Spar, MD ?Reason for referral : Care Coordination (Initial outreach to schedule referral with SW ) ? ? ?An unsuccessful telephone outreach was attempted today. The patient was referred to the case management team for assistance with care management and care coordination.  ? ?Follow Up Plan:  ?A HIPAA compliant phone message was left for the patient providing contact information and requesting a return call.  ?The care management team will reach out to the patient again over the next 14 days.  ?If patient returns call to provider office, please advise to call [place]* at [phone].* ? ?Laverda Sorenson  ?Care Guide, Embedded Care Coordination ?[facility] Management  ?Direct Dial: [phone] ? ?

## 2022-03-26 ENCOUNTER — Ambulatory Visit (INDEPENDENT_AMBULATORY_CARE_PROVIDER_SITE_OTHER): Payer: Medicare HMO | Admitting: Internal Medicine

## 2022-03-26 ENCOUNTER — Telehealth: Payer: Self-pay

## 2022-03-26 ENCOUNTER — Encounter: Payer: Self-pay | Admitting: Internal Medicine

## 2022-03-26 DIAGNOSIS — F331 Major depressive disorder, recurrent, moderate: Secondary | ICD-10-CM | POA: Diagnosis not present

## 2022-03-26 MED ORDER — FLUOXETINE HCL 20 MG PO CAPS: 20.0000 mg | ORAL_CAPSULE | Freq: Every day | ORAL | 2 refills | Status: DC

## 2022-03-26 NOTE — Assessment & Plan Note (Signed)
Uncontrolled ? ?Did not tolerate Zoloft - drowsiness and diarrhea ?Cymbalta was ineffective ?Did not like effect of Lexapro ?Was better with Effexor for anxiety, depression and hot flashes -increased dose to 75 mg BID, but still has persistent symptoms ?Will taper Effexor for now and switch to Prozac ?May consider Vraylar if she has persistent uncontrolled depression ?Referred to [facility] therapy ?

## 2022-03-26 NOTE — Telephone Encounter (Signed)
Patient called and said DR Posey Pronto went up on effexor 75 mg and it is not working does not see no difference.  Please return call [phone]. ?

## 2022-03-26 NOTE — Telephone Encounter (Signed)
Please schedule pt a virtual visit this afternoon around 4pm ?

## 2022-03-26 NOTE — Telephone Encounter (Signed)
Scheduled appt.
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
## 2022-03-26 NOTE — Patient Instructions (Signed)
Please take half tablet of Effexor twice daily for the next 4 days and then half tablet of Effexor once daily for the next 3 days. ? ?Please start taking Prozac after tapering Effexor in the next week. ?

## 2022-03-28 ENCOUNTER — Ambulatory Visit: Payer: Medicare HMO | Admitting: Physician Assistant

## 2022-03-28 ENCOUNTER — Encounter: Payer: Self-pay | Admitting: Physician Assistant

## 2022-03-28 ENCOUNTER — Ambulatory Visit (INDEPENDENT_AMBULATORY_CARE_PROVIDER_SITE_OTHER): Payer: Medicare HMO

## 2022-03-28 VITALS — BP 145/64 | HR 76 | Ht 63.0 in | Wt 141.8 lb

## 2022-03-28 DIAGNOSIS — R002 Palpitations: Secondary | ICD-10-CM | POA: Diagnosis not present

## 2022-03-28 DIAGNOSIS — I10 Essential (primary) hypertension: Secondary | ICD-10-CM | POA: Diagnosis not present

## 2022-03-28 DIAGNOSIS — I350 Nonrheumatic aortic (valve) stenosis: Secondary | ICD-10-CM | POA: Diagnosis not present

## 2022-03-28 MED ORDER — IRBESARTAN 150 MG PO TABS
150.0000 mg | ORAL_TABLET | Freq: Every day | ORAL | 3 refills | Status: DC
Start: 2022-03-28 — End: 2022-06-14

## 2022-03-28 NOTE — Progress Notes (Signed)
?Cardiology Office Note:   ? ?Date:  03/30/2022  ? ?ID:  [name] [name], DOB 09-22-1951, MRN [id number] ? ?PCP:  Lindell Spar, MD ?  ?[facility] Providers ?Cardiologist:  Sanda Klein, MD    ? ?Referring MD: Lindell Spar, MD  ? ?Chief Complaint  ?Patient presents with  ? Follow-up  ?  Seen for Dr. Sallyanne Kuster  ? ? ?History of Present Illness:   ? ?[name] [name] is a 71 y.o. female with a hx of aortic stenosis, cirrhosis with esophageal varices, portal hypertension, splenomegaly, thrombocytopenia, probable hepatic encephalopathy, chronic diarrhea, aortic atherosclerosis of abdominal aorta, HTN, HLD, DM2 and MGUS.  Patient had a normal nuclear stress test in 2010.  Echocardiogram in 2019 showed aortic valve sclerosis with mild insufficiency but no stenosis, EF 60 to 65%.  Repeat echocardiogram in June 2022 showed moderate aortic stenosis, mean transvalvular gradient 20 mmHg.  She takes atenolol for palpitation, this was later switched to metoprolol.  Metoprolol later stopped providing relief either.  She was seen in the ED on 11/29/2021 for elevated blood pressure.  She was given hydralazine 25 mg 3 times a day, however she has difficulty taking 3 times a day doses.  She misunderstood the instruction and stopped the losartan when she started taking the hydralazine.  Spironolactone was later started to help with the blood pressure.  This medication was later stopped.  She had a cough with lisinopril.  Patient was recently seen by Dr. Sallyanne Kuster in December 2022 at which time she did not have any symptoms associated with aortic stenosis.  The plan was to repeat echocardiogram in 1 year.  Amlodipine 5 mg was added to her medical regimen.  Metoprolol was increased to 75 mg twice a day. ?  ?I last saw the patient on 01/30/2022, at which time she continued to have fatigue.  Her blood pressure was higher on that visit, and she felt fatigued all the time.  As a trial, I increased the dose of metoprolol to 100 mg  twice a day.  Unfortunately, she has significant fatigue on the metoprolol, her blood pressure remains elevated between 140-170s.  Manual check by myself today was 144/60.  She previously tried spironolactone which was discontinued on 10/06/2021 by PCP due to intolerance.  Patient does not remember the side effect of spironolactone.  I discussed the case with Dr. Sallyanne Kuster, we switched her to carvedilol 25 mg twice a day to help control her blood pressure and also for her history of cirrhosis.  ? ?Patient presents today for follow-up.  Since starting on the carvedilol, her blood pressure still in the 130-140s range with occasional peaking to 160s.  I recommend switch losartan to irbesartan 150 mg daily.  He if systolic blood pressure score is still greater than 140 mmHg after 2 weeks, she has been instructed to increase irbesartan to 300 mg daily.  She is due for repeat echocardiogram in June.  She continues to have significant palpitation.  She reportedly had heart monitor in the past ordered by Dr. Claiborne Billings, however I am unable to find the record.  I recommend a 3-day heart monitor.  I suspect based on her description it is likely PVC and PACs.  I want to see her PVC burden.  If less than 10%, I would not recommend any further treatment for her symptomatic PVCs.  She can follow-up with Dr. Sallyanne Kuster in June as previously scheduled. ? ?Past Medical History:  ?Diagnosis Date  ? Allergy   ?  Anemia, unspecified   ? Anxiety   ? Arthritis   ? Chronic diarrhea   ? Cirrhosis ([place])   ? Depression   ? Depression   ? Elevated cholesterol   ? Enterocolitis due to Clostridium difficile, not specified as recurrent   ? Gastro-esophageal reflux disease with esophagitis   ? Helicobacter pylori gastritis 07/10/2017  ? Dx EGD -  1) Omeprazole 20 mg 2 times a day x 14 d 2) Pepto Bismol 2 tabs (262 mg each) 4 times a day x 14 d 3) Metronidazole 250 mg 4 times a day x 14 d 4) doxycycline 100 mg 2 times a day x 14 d  After 14 d stop  omeprazole also  In 4 weeks after treatment completed do H. Pylori stool antigen - dx H. Pylori gastritis   ? Hx of adenomatous colonic polyps 10/05/2015  ? Hyperlipemia 02/21/2009  ? NUC STRESS TEST-NORMAL- EF 74%  ? Hypertension   ? Iron deficiency anemia   ? Irritable bowel syndrome   ? Localized edema   ? Major depressive disorder, single episode, unspecified   ? MVP (mitral valve prolapse)   ? Rectocele   ? Thrombocytopenia ([place])   ? Type 2 diabetes mellitus with hyperglycemia (HCC)   ? ? ?Past Surgical History:  ?Procedure Laterality Date  ? ABDOMINAL HYSTERECTOMY    ? BILATERAL SALPINGOOPHORECTOMY    ? carpel tunnel Bilateral 12/17/2009  ? COLONOSCOPY    ? ESOPHAGOGASTRODUODENOSCOPY    ? TUBAL LIGATION    ? ? ?Current Medications: ?Current Meds  ?Medication Sig  ? amLODipine (NORVASC) 5 MG tablet Take 1 tablet (5 mg total) by mouth daily.  ? BIOTIN PO Take 500 mg by mouth daily.  ? carvedilol (COREG) 25 MG tablet Take 1 tablet (25 mg total) by mouth 2 (two) times daily.  ? FLUoxetine (PROZAC) 20 MG capsule Take 1 capsule (20 mg total) by mouth daily.  ? glipiZIDE (GLUCOTROL) 10 MG tablet Take 10 mg by mouth daily before breakfast. 1 Tablet Daily Before Breakfast  ? Glucagon (GVOKE HYPOPEN 2-PACK) 1 MG/0.2ML SOAJ Inject 0.2 mg into the skin as needed (For low blood glucose < 55).  ? hydrALAZINE (APRESOLINE) 25 MG tablet Take 1 tablet (25 mg total) by mouth 3 (three) times daily as needed (For Systolic over 696).  ? insulin glargine, 1 Unit Dial, (TOUJEO SOLOSTAR) 300 UNIT/ML Solostar Pen Inject 60 Units into the skin 1 day or 1 dose for 1 dose.  ? Insulin Pen Needle 32G X 4 MM MISC 1 each by Does not apply route 4 (four) times daily.  ? irbesartan (AVAPRO) 150 MG tablet Take 1 tablet (150 mg total) by mouth daily.  ? rosuvastatin (CRESTOR) 5 MG tablet Take 5 mg by mouth daily. Takes one on Monday, Wed., and Friday  ? triamcinolone cream (KENALOG) 0.1 % APPLY TO AFFECTED AREA TWICE A DAY  ? venlafaxine (EFFEXOR)  75 MG tablet Take 75 mg by mouth daily.  ? [DISCONTINUED] losartan (COZAAR) 100 MG tablet TAKE 1 TABLET BY MOUTH EVERY DAY  ? ?Current Facility-Administered Medications for the 03/28/22 encounter (Office Visit) with Almyra Deforest, [place]  ?Medication  ? 0.9 %  sodium chloride infusion  ?  ? ?Allergies:   Codeine and Spironolactone  ? ?Social History  ? ?Socioeconomic History  ? Marital status: Married  ?  Spouse name: Louie Casa  ? Number of children: 2  ? Years of education: Not on file  ? Highest education level: Not on  file  ?Occupational History  ? Not on file  ?Tobacco Use  ? Smoking status: Former  ?  Years: 30.00  ?  Types: Cigarettes  ?  Quit date: 09/03/1997  ?  Years since quitting: 24.5  ? Smokeless tobacco: Current  ?  Types: Snuff  ? Tobacco comments:  ?  call when ready to quit  ?Vaping Use  ? Vaping Use: Never used  ?Substance and Sexual Activity  ? Alcohol use: No  ?  Alcohol/week: 0.0 standard drinks  ? Drug use: No  ? Sexual activity: Not on file  ?Other Topics Concern  ? Not on file  ?Social History Narrative  ? Married 2 sons 1 lives in [place] 1 lives in [place]  ? She is a housewife  ? No alcohol tobacco or drug use at this time  ? Former smoker quit in 1998  ? ?Social Determinants of Health  ? ?Financial Resource Strain: Low Risk   ? Difficulty of Paying Living Expenses: Not hard at all  ?Food Insecurity: No Food Insecurity  ? Worried About Charity fundraiser in the Last Year: Never true  ? Ran Out of Food in the Last Year: Never true  ?Transportation Needs: No Transportation Needs  ? Lack of Transportation (Medical): No  ? Lack of Transportation (Non-Medical): No  ?Physical Activity: Sufficiently Active  ? Days of Exercise per Week: 5 days  ? Minutes of Exercise per Session: 30 min  ?Stress: No Stress Concern Present  ? Feeling of Stress : Not at all  ?Social Connections: Moderately Integrated  ? Frequency of Communication with Friends and Family: More than three times a week  ? Frequency of  Social Gatherings with Friends and Family: More than three times a week  ? Attends Religious Services: More than 4 times per year  ? Active Member of Clubs or Organizations: No  ? Attends [employer], production

## 2022-03-28 NOTE — Progress Notes (Unsigned)
Enrolled for [employer] to mail a ZIO XT long term holter monitor to the patients address on file.   Dr. Croitoru to read. 

## 2022-03-28 NOTE — Patient Instructions (Addendum)
Medication Instructions:  ?STOP Losartan  ?START Irbesartan 150 mg daily ? ?*If you need a refill on your cardiac medications before your next appointment, please call your pharmacy* ? ?Lab Work: ?NONE ordered at this time of appointment  ? ?If you have labs (blood work) drawn today and your tests are completely normal, you will receive your results only by: ?MyChart Message (if you have MyChart) OR ?A paper copy in the mail ?If you have any lab test that is abnormal or we need to change your treatment, we will call you to review the results. ? ?Testing/Procedures: ?Your physician has requested that you have an echocardiogram. Echocardiography is a painless test that uses sound waves to create images of your heart. It provides your doctor with information about the size and shape of your heart and how well your heart?s chambers and valves are working. This procedure takes approximately one hour. There are no restrictions for this procedure. ? ?Please schedule for late May June  ? ?ZIO XT- Long Term Monitor Instructions ? ?Your physician has requested you wear a ZIO patch monitor for 3 days.  ?This is a single patch monitor. [employer] supplies one patch monitor per enrollment. Additional ?stickers are not available. Please do not apply patch if you will be having a Nuclear Stress Test,  ?Echocardiogram, Cardiac CT, MRI, or Chest Xray during the period you would be wearing the  ?monitor. The patch cannot be worn during these tests. You cannot remove and re-apply the  ?ZIO XT patch monitor.  ?Your ZIO patch monitor will be mailed 3 day USPS to your address on file. It may take 3-5 days  ?to receive your monitor after you have been enrolled.  ?Once you have received your monitor, please review the enclosed instructions. Your monitor  ?has already been registered assigning a specific monitor serial # to you. ? ?Billing and Patient Assistance Program Information ? ?We have supplied [employer] with any of your insurance  information on file for billing purposes. ?[employer] offers a sliding scale Patient Assistance Program for patients that do not have  ?insurance, or whose insurance does not completely cover the cost of the ZIO monitor.  ?You must apply for the Patient Assistance Program to qualify for this discounted rate.  ?To apply, please call [employer] at [phone], select option 4, select option 2, ask to apply for  ?Patient Assistance Program. Theodore Demark will ask your household income, and how many people  ?are in your household. They will quote your out-of-pocket cost based on that information.  ?[employer] will also be able to set up a 65-month interest-free payment plan if needed. ? ?Applying the monitor ?  ?Shave hair from upper left chest.  ?Hold abrader disc by orange tab. Rub abrader in 40 strokes over the upper left chest as  ?indicated in your monitor instructions.  ?Clean area with 4 enclosed alcohol pads. Let dry.  ?Apply patch as indicated in monitor instructions. Patch will be placed under collarbone on left  ?side of chest with arrow pointing upward.  ?Rub patch adhesive wings for 2 minutes. Remove white label marked "1". Remove the white  ?label marked "2". Rub patch adhesive wings for 2 additional minutes.  ?While looking in a mirror, press and release button in center of patch. A small green light will  ?flash 3-4 times. This will be your only indicator that the monitor has been turned on.  ?Do not shower for the first 24 hours. You may shower after the first 24  hours.  ?Press the button if you feel a symptom. You will hear a small click. Record Date, Time and  ?Symptom in the Patient Logbook.  ?When you are ready to remove the patch, follow instructions on the last 2 pages of Patient  ?Logbook. Stick patch monitor onto the last page of Patient Logbook.  ?Place Patient Logbook in the blue and white box. Use locking tab on box and tape box closed  ?securely. The blue and white box has prepaid postage on it. Please  place it in the mailbox as  ?soon as possible. Your physician should have your test results approximately 7 days after the  ?monitor has been mailed back to [facility].  ?Call [facility] at [phone] if you have questions regarding  ?your ZIO XT patch monitor. Call them immediately if you see an orange light blinking on your  ?monitor.  ?If your monitor falls off in less than 4 days, contact our Monitor department at [phone].  ?If your monitor becomes loose or falls off after 4 days call [employer] at [phone] for  ?suggestions on securing your monitor ? ? ?Follow-Up: ?At [facility], you and your health needs are our priority.  As part of our continuing mission to provide you with exceptional heart care, we have created designated Provider Care Teams.  These Care Teams include your primary Cardiologist (physician) and Advanced Practice Providers (APPs -  Physician Assistants and Nurse Practitioners) who all work together to provide you with the care you need, when you need it. ? ?We recommend signing up for the patient portal called "MyChart".  Sign up information is provided on this After Visit Summary.  MyChart is used to connect with patients for Virtual Visits (Telemedicine).  Patients are able to view lab/test results, encounter notes, upcoming appointments, etc.  Non-urgent messages can be sent to your provider as well.   ?To learn more about what you can do with MyChart, go to [email or web address].   ? ?Your next appointment:   ?As previously scheduled   ? ?The format for your next appointment:   ?In Person ? ?Provider:   ?Sanda Klein, MD   ? ?Other Instructions ?CONTINUE to monitor blood pressure at home. After 2 weeks of monitoring if your systolic (top number) of blood pressure is consistently greater than 140 you may increase Irbesartan to 300 mg (2 tablets) and give our office a call.    ? ?Important Information About Sugar ? ? ? ? ? ? ?

## 2022-03-30 ENCOUNTER — Encounter: Payer: Self-pay | Admitting: Physician Assistant

## 2022-03-31 DIAGNOSIS — R002 Palpitations: Secondary | ICD-10-CM

## 2022-04-02 NOTE — Chronic Care Management (AMB) (Signed)
?  Care Management  ? ?Outreach Note ? ?04/02/2022 ?Name: [name] [name] MRN: [id number] DOB: Apr 15, 1951 ? ?Referred by: Lindell Spar, MD ?Reason for referral : Care Coordination (Initial outreach to schedule referral with SW ) ? ? ?A second unsuccessful telephone outreach was attempted today. The patient was referred to the case management team for assistance with care management and care coordination.  ? ?Follow Up Plan:  ?A HIPAA compliant phone message was left for the patient providing contact information and requesting a return call.  ?The care management team will reach out to the patient again over the next 7 days.  ?If patient returns call to provider office, please advise to call [place]* at [phone].* ? ?Laverda Sorenson  ?Care Guide, Embedded Care Coordination ?[facility]  Care Management  ?Direct Dial: [phone] ? ?

## 2022-04-04 ENCOUNTER — Encounter: Payer: Self-pay | Admitting: Internal Medicine

## 2022-04-04 ENCOUNTER — Ambulatory Visit (INDEPENDENT_AMBULATORY_CARE_PROVIDER_SITE_OTHER): Payer: Medicare HMO | Admitting: Internal Medicine

## 2022-04-04 DIAGNOSIS — R188 Other ascites: Secondary | ICD-10-CM | POA: Diagnosis not present

## 2022-04-04 DIAGNOSIS — K7460 Unspecified cirrhosis of liver: Secondary | ICD-10-CM | POA: Diagnosis not present

## 2022-04-04 DIAGNOSIS — K766 Portal hypertension: Secondary | ICD-10-CM

## 2022-04-04 DIAGNOSIS — K3189 Other diseases of stomach and duodenum: Secondary | ICD-10-CM

## 2022-04-04 NOTE — Progress Notes (Signed)
?  ? ?Virtual Visit via Telephone Note  ? ?This visit type was conducted due to national recommendations for restrictions regarding the COVID-19 Pandemic (e.g. social distancing) in an effort to limit this patient's exposure and mitigate transmission in our community.  Due to her co-morbid illnesses, this patient is at least at moderate risk for complications without adequate follow up.  This format is felt to be most appropriate for this patient at this time.  The patient did not have access to video technology/had technical difficulties with video requiring transitioning to audio format only (telephone).  All issues noted in this document were discussed and addressed.  No physical exam could be performed with this format. ? ?Evaluation Performed:  Follow-up visit ? ?Date:  04/04/2022  ? ?ID:  [name] [name], DOB [birth year], MRN [id number] ? ?Patient Location: Home ?Provider Location: Office/Clinic ? ?Participants: Patient ?Location of Patient: Home ?Location of Provider: Telehealth ?Consent was obtain for visit to be over via telehealth. ?I verified that I am speaking with the correct person using two identifiers. ? ?PCP:  Lindell Spar, MD  ? ?Chief Complaint: Abdominal distension ? ?History of Present Illness:   ? ?[name] [name] is a 71 y.o. female who has a televisit for complaint of abdominal distention and bloating for the last 1 week.  She denies any diarrhea or constipation currently.  She has chronic nausea.  She has history of NASH with cirrhosis and has had ascites in the past.  She also has esophageal varices and portal hypertensive gastropathy.  She denies any LE swelling or dyspnea currently.  She was placed on spironolactone in the past, but did not tolerate it.  She is currently on Coreg for HTN, which could also help with esophageal varices. ? ?The patient does not have symptoms concerning for COVID-19 infection (fever, chills, cough, or new shortness of breath).  ? ?Past Medical, Surgical,  Social History, Allergies, and Medications have been Reviewed. ? ?Past Medical History:  ?Diagnosis Date  ? Allergy   ? Anemia, unspecified   ? Anxiety   ? Arthritis   ? Chronic diarrhea   ? Cirrhosis ([place])   ? Depression   ? Depression   ? Elevated cholesterol   ? Enterocolitis due to Clostridium difficile, not specified as recurrent   ? Gastro-esophageal reflux disease with esophagitis   ? Helicobacter pylori gastritis 07/10/2017  ? Dx EGD -  1) Omeprazole 20 mg 2 times a day x 14 d 2) Pepto Bismol 2 tabs (262 mg each) 4 times a day x 14 d 3) Metronidazole 250 mg 4 times a day x 14 d 4) doxycycline 100 mg 2 times a day x 14 d  After 14 d stop omeprazole also  In 4 weeks after treatment completed do H. Pylori stool antigen - dx H. Pylori gastritis   ? Hx of adenomatous colonic polyps 10/05/2015  ? Hyperlipemia 02/21/2009  ? NUC STRESS TEST-NORMAL- EF 74%  ? Hypertension   ? Iron deficiency anemia   ? Irritable bowel syndrome   ? Localized edema   ? Major depressive disorder, single episode, unspecified   ? MVP (mitral valve prolapse)   ? Rectocele   ? Thrombocytopenia ([place])   ? Type 2 diabetes mellitus with hyperglycemia (HCC)   ? ?Past Surgical History:  ?Procedure Laterality Date  ? ABDOMINAL HYSTERECTOMY    ? BILATERAL SALPINGOOPHORECTOMY    ? carpel tunnel Bilateral 12/17/2009  ? COLONOSCOPY    ? ESOPHAGOGASTRODUODENOSCOPY    ?  TUBAL LIGATION    ?  ? ?Current Meds  ?Medication Sig  ? BIOTIN PO Take 500 mg by mouth daily.  ? carvedilol (COREG) 25 MG tablet Take 1 tablet (25 mg total) by mouth 2 (two) times daily.  ? ezetimibe (ZETIA) 10 MG tablet Take 1 tablet (10 mg total) by mouth daily.  ? ferrous sulfate 325 (65 FE) MG tablet Take 325 mg by mouth daily with breakfast.  ? FLUoxetine (PROZAC) 20 MG capsule Take 1 capsule (20 mg total) by mouth daily.  ? glipiZIDE (GLUCOTROL) 10 MG tablet Take 10 mg by mouth daily before breakfast. 1 Tablet Daily Before Breakfast  ? Glucagon (GVOKE HYPOPEN 2-PACK) 1 MG/0.2ML SOAJ  Inject 0.2 mg into the skin as needed (For low blood glucose < 55).  ? Insulin Pen Needle 32G X 4 MM MISC 1 each by Does not apply route 4 (four) times daily.  ? irbesartan (AVAPRO) 150 MG tablet Take 1 tablet (150 mg total) by mouth daily.  ? rosuvastatin (CRESTOR) 5 MG tablet Take 5 mg by mouth daily. Takes one on Monday, Wed., and Friday  ? triamcinolone cream (KENALOG) 0.1 % APPLY TO AFFECTED AREA TWICE A DAY  ? ?Current Facility-Administered Medications for the 04/04/22 encounter (Office Visit) with Lindell Spar, MD  ?Medication  ? 0.9 %  sodium chloride infusion  ?  ? ?Allergies:   Codeine and Spironolactone  ? ?ROS:   ?Please see the history of present illness.    ? ?All other systems reviewed and are negative. ? ? ?Labs/Other Tests and Data Reviewed:   ? ?Recent Labs: ?09/05/2021: ALT 18 ?03/08/2022: BUN 17; Creatinine, Ser 0.98; Hemoglobin 11.2; Platelets 117; Potassium 4.8; Sodium 144  ? ?Recent Lipid Panel ?Lab Results  ?Component Value Date/Time  ? CHOL 214 (H) 09/05/2021 08:45 AM  ? TRIG 93 09/05/2021 08:45 AM  ? HDL 54 09/05/2021 08:45 AM  ? CHOLHDL 4.0 09/05/2021 08:45 AM  ? CHOLHDL 2.9 04/16/2018 07:32 AM  ? LDLCALC 143 (H) 09/05/2021 08:45 AM  ? LDLCALC 105 (H) 04/16/2018 07:32 AM  ? ? ?Wt Readings from Last 3 Encounters:  ?03/28/22 141 lb 12.8 oz (64.3 kg)  ?03/08/22 146 lb 3.2 oz (66.3 kg)  ?02/21/22 144 lb 3.2 oz (65.4 kg)  ?  ? ?ASSESSMENT & PLAN:   ? ?Cirrhosis of liver with ascites ([place]) ?Check US abdomen for ascites ?Followed by GI, advised to contact for abdominal distension ?Recent EGD showed portal hypertensive gastropathy and esophageal varices ?On B-blocker for ppx ?Did not tolerate spironolactone ? ?Portal hypertensive gastropathy (HCC) ?On Coreg now ?Followed by GI ? ? ?Time:   ?Today, I have spent 13 minutes reviewing the chart, including problem list, medications, and with the patient with telehealth technology discussing the above problems. ? ? ?Medication Adjustments/Labs and Tests  Ordered: ?Current medicines are reviewed at length with the patient today.  Concerns regarding medicines are outlined above.  ? ?Tests Ordered: ?No orders of the defined types were placed in this encounter. ? ? ?Medication Changes: ?No orders of the defined types were placed in this encounter. ? ? ? ?Note: This dictation was prepared with Dragon dictation along with smaller phrase technology. Similar sounding words can be transcribed inadequately or may not be corrected upon review. Any transcriptional errors that result from this process are unintentional.  ?  ? ? ?Disposition:  Follow up  ?Signed, ?Lindell Spar, MD  ?04/04/2022 11:36 AM    ? ?[facility] ?[facility] ?

## 2022-04-04 NOTE — Patient Instructions (Signed)
Please contact your gastroenterologist for bloating and abdominal distention. ? ?You are being scheduled for a US of the abdomen. ?

## 2022-04-04 NOTE — Assessment & Plan Note (Signed)
Check US abdomen for ascites ?Followed by GI, advised to contact for abdominal distension ?Recent EGD showed portal hypertensive gastropathy and esophageal varices ?On B-blocker for ppx ?Did not tolerate spironolactone ?

## 2022-04-04 NOTE — Assessment & Plan Note (Signed)
On Coreg now ?Followed by GI ?

## 2022-04-06 ENCOUNTER — Telehealth: Payer: Self-pay | Admitting: *Deleted

## 2022-04-06 NOTE — Chronic Care Management (AMB) (Signed)
?  Care Management  ? ?Outreach Note ? ?04/06/2022 ?Name: [name] [name] MRN: [id number] DOB: [birth year] ? ?Referred by: Lindell Spar, MD ?Reason for referral : Care Coordination (Initial outreach to schedule referral with SW ) ? ? ?Third unsuccessful telephone outreach was attempted today. The patient was referred to the case management team for assistance with care management and care coordination. The patient's primary care provider has been notified of our unsuccessful attempts to make or maintain contact with the patient. The care management team is pleased to engage with this patient at any time in the future should he/she be interested in assistance from the care management team.  ? ?Follow Up Plan:  ?We have been unable to make contact with the patient. The care management team is available to follow up with the patient after provider conversation with the patient regarding recommendation for care management engagement and subsequent re-referral to the care management team.  ?A HIPAA compliant phone message was left for the patient providing contact information and requesting a return call. If patient returns call to provider office, please advise to call [place]* at [phone]* ? ?Laverda Sorenson  ?Care Guide, Embedded Care Coordination ?[facility]  Care Management  ?Direct Dial: [phone] ? ?

## 2022-04-06 NOTE — Chronic Care Management (AMB) (Signed)
?  Care Management  ? ?Note ? ?04/06/2022 ?Name: [name] [name] MRN: [id number] DOB: 31-Mar-1951 ? ?[name] [name] is a 71 y.o. year old female who is a primary care patient of Lindell Spar, MD. I reached out to [name] by phone today offer care coordination services.  ? ?[name] was given information about care management services today including:  ?Care management services include personalized support from designated clinical staff supervised by her physician, including individualized plan of care and coordination with other care providers ?24/7 contact phone numbers for assistance for urgent and routine care needs. ?The patient may stop care management services at any time by phone call to the office staff. ? ?Patient did not agree to enrollment in care management services and does not wish to consider at this time. ? ?Follow up plan: ?Patient declines further follow up and engagement by the care management team. Appropriate care team members and provider have been notified via electronic communication. The care management team is available to follow up with the patient after provider conversation with the patient regarding recommendation for care management engagement and subsequent re-referral to the care management team.  ? ? ?Laverda Sorenson  ?Care Guide, Embedded Care Coordination ?[facility]  Care Management  ?Direct Dial: [phone] ? ?

## 2022-04-10 ENCOUNTER — Ambulatory Visit (HOSPITAL_COMMUNITY)
Admission: RE | Admit: 2022-04-10 | Discharge: 2022-04-10 | Disposition: A | Discharge status: Home / Self Care | Payer: Medicare HMO | Source: Ambulatory Visit | Attending: Internal Medicine | Admitting: Internal Medicine

## 2022-04-10 DIAGNOSIS — R188 Other ascites: Secondary | ICD-10-CM | POA: Diagnosis not present

## 2022-04-10 DIAGNOSIS — K7460 Unspecified cirrhosis of liver: Secondary | ICD-10-CM | POA: Insufficient documentation

## 2022-04-10 DIAGNOSIS — K766 Portal hypertension: Secondary | ICD-10-CM | POA: Insufficient documentation

## 2022-04-10 DIAGNOSIS — K3189 Other diseases of stomach and duodenum: Secondary | ICD-10-CM

## 2022-04-10 DIAGNOSIS — R002 Palpitations: Secondary | ICD-10-CM | POA: Diagnosis not present

## 2022-04-11 ENCOUNTER — Telehealth: Payer: Self-pay | Admitting: Internal Medicine

## 2022-04-11 ENCOUNTER — Encounter: Payer: Self-pay | Admitting: Nurse Practitioner

## 2022-04-11 ENCOUNTER — Other Ambulatory Visit: Payer: Self-pay

## 2022-04-11 NOTE — Telephone Encounter (Signed)
Pt notified with verbal understanding to keep appt at her office gastro in [place] is booking out a ways. ?

## 2022-04-11 NOTE — Telephone Encounter (Signed)
Called back in regard to [place] results. ? ?Patient contacted [place] office and next avaliable appt is not before 5/10 or 5/19  ? ?Patient is requesting referral to Gertie Fey in town [facility] office) to see if can get in before 5/10. ? ?Patient would like a call back in regard.  ?

## 2022-04-11 NOTE — Telephone Encounter (Signed)
Pt advised with verbal understanding  °

## 2022-04-11 NOTE — Telephone Encounter (Signed)
Pt called back for her [place] results, please call back when available. ? ? ? ?Call back # [phone] ?

## 2022-04-12 NOTE — Progress Notes (Signed)
Rare episode of premature heart beat. 2 brief episode of burst of fast rate. No prolonged irregular rhythm seen. No change in treatment for now.

## 2022-04-18 ENCOUNTER — Telehealth: Payer: Self-pay | Admitting: Internal Medicine

## 2022-04-18 NOTE — Telephone Encounter (Signed)
Virtual appt scheduled ?

## 2022-04-18 NOTE — Telephone Encounter (Signed)
Pt husband called stating she is out of her nausea medication. Wants to know if it can be refilled? ? ? ?Ondansetron ODT '4mg'$   ? ? ? ?[facility]  ?

## 2022-04-18 NOTE — Telephone Encounter (Signed)
This is not on patient med list as taking and she will need a virtual appointment to discuss  ?

## 2022-04-20 ENCOUNTER — Other Ambulatory Visit: Payer: Self-pay | Admitting: Internal Medicine

## 2022-04-20 ENCOUNTER — Telehealth: Payer: Medicare HMO | Admitting: Internal Medicine

## 2022-04-20 DIAGNOSIS — F331 Major depressive disorder, recurrent, moderate: Secondary | ICD-10-CM

## 2022-04-20 DIAGNOSIS — R110 Nausea: Secondary | ICD-10-CM

## 2022-04-20 MED ORDER — ONDANSETRON HCL 4 MG PO TABS: 4.0000 mg | ORAL_TABLET | Freq: Three times a day (TID) | ORAL | 0 refills | Status: DC | PRN

## 2022-04-25 ENCOUNTER — Encounter: Payer: Self-pay | Admitting: Nurse Practitioner

## 2022-04-25 ENCOUNTER — Other Ambulatory Visit (INDEPENDENT_AMBULATORY_CARE_PROVIDER_SITE_OTHER): Payer: Medicare HMO

## 2022-04-25 ENCOUNTER — Ambulatory Visit: Payer: Medicare HMO | Admitting: Nurse Practitioner

## 2022-04-25 VITALS — BP 120/50 | HR 56 | Ht 63.0 in | Wt 138.0 lb

## 2022-04-25 DIAGNOSIS — K7460 Unspecified cirrhosis of liver: Secondary | ICD-10-CM

## 2022-04-25 DIAGNOSIS — R188 Other ascites: Secondary | ICD-10-CM

## 2022-04-25 LAB — COMPREHENSIVE METABOLIC PANEL
ALT: 9 U/L (ref 0–35)
AST: 22 U/L (ref 0–37)
Albumin: 3.7 g/dL (ref 3.5–5.2)
Alkaline Phosphatase: 75 U/L (ref 39–117)
BUN: 25 mg/dL — ABNORMAL HIGH (ref 6–23)
CO2: 25 mEq/L (ref 19–32)
Calcium: 9.2 mg/dL (ref 8.4–10.5)
Chloride: 107 mEq/L (ref 96–112)
Creatinine, Ser: 1.16 mg/dL (ref 0.40–1.20)
GFR: 48.42 mL/min — ABNORMAL LOW (ref >60.00)
Glucose, Bld: 110 mg/dL — ABNORMAL HIGH (ref 70–99)
Potassium: 5 mEq/L (ref 3.5–5.1)
Sodium: 140 mEq/L (ref 135–145)
Total Bilirubin: 0.8 mg/dL (ref 0.2–1.2)
Total Protein: 7.9 g/dL (ref 6.0–8.3)

## 2022-04-25 LAB — CBC
HCT: 32.9 % — ABNORMAL LOW (ref 36.0–46.0)
Hemoglobin: 10.8 g/dL — ABNORMAL LOW (ref 12.0–15.0)
MCHC: 32.9 g/dL (ref 30.0–36.0)
MCV: 81.3 fl (ref 78.0–100.0)
Platelets: 152 10*3/uL (ref 150.0–400.0)
RBC: 4.05 Mil/uL (ref 3.87–5.11)
RDW: 16.2 % — ABNORMAL HIGH (ref 11.5–15.5)
WBC: 6.5 10*3/uL (ref 4.0–10.5)

## 2022-04-25 LAB — PROTIME-INR
INR: 1.2 ratio — ABNORMAL HIGH (ref 0.8–1.0)
Prothrombin Time: 12.6 s (ref 9.6–13.1)

## 2022-04-25 MED ORDER — ALBUMIN HUMAN 25 % IV SOLN: 50.0000 g | Freq: Once | INTRAVENOUS | Status: DC

## 2022-04-25 NOTE — Patient Instructions (Addendum)
You have been scheduled for an abdominal paracentesis at [facility] radiology (1st floor of hospital) on May 17th at 1:00 pm. Please arrive at least 15 minutes prior to your appointment time for registration. Should you need to reschedule this appointment for any reason, please call our office at [phone].Do not take your morning dose of diabetic medication. ? ?You have been scheduled for an abdominal ultrasound at [facility] Radiology  on May 17th at 9:30 am.  ?Please arrive 15 minutes prior to your appointment for registration.  ?Make certain not to have anything to eat or drink after midnight prior to your appointment.  ?This test typically takes about 30 minutes to perform. ? ?You have been scheduled for a EGD. Please follow the written instructions given to you at your visit today. ?If you use inhalers (even only as needed), please bring them with you on the day of your procedure. ? ?Please proceed to the basement level for lab work before leaving today. Press "B" on the elevator. The lab is located at the first door on the left as you exit the elevator. ? ?Follow a 2 gram low sodium diet. ? ?Thank you for trusting me with your gastrointestinal care!   ? ?Tye Savoy, NP ? ? ?BMI: ? ?If you are age 69 or older, your body mass index should be between 23-30. Your Body mass index is 24.45 kg/m?Marland Kitchen If this is out of the aforementioned range listed, please consider follow up with your Primary Care Provider. ? ?If you are age 44 or younger, your body mass index should be between 19-25. Your Body mass index is 24.45 kg/m?Marland Kitchen If this is out of the aformentioned range listed, please consider follow up with your Primary Care Provider.  ? ?MY CHART: ? ?The [facility] providers would like to encourage you to use [facility] to communicate with providers for non-urgent requests or questions.  Due to long hold times on the telephone, sending your provider a message by [facility] may be a faster and more efficient way to get a  response.  Please allow 48 business hours for a response.  Please remember that this is for non-urgent requests.  ? ? ? ?

## 2022-04-25 NOTE — Progress Notes (Addendum)
? ? ?Assessment  ? ?Patient Profile:  ?[name] [name] is a 71 y.o. female known to Dr. Carlean Purl with a past medical history of cirrhosis with portal hypertension suspected to be secondary to [facility], MGUS, H. pylori gastritis treated in 2018, iron deficiency anemia, adenomatous colon polyps, moderate aortic stenosis, HTN. Additional medical history as listed in [place] . ? ?Decompensated cirrhosis, presumably NASH related. History of varices and now with new ascites ?Not seen since Feb 2022.  Needs HCC screening, due for varices surveillance. Last EGD April 2022 with Grade II esophageal varices with no stigmata of recent bleeding, portal hypertensive gastropathy.She was put on a beta blocker early this month. History of palpitation.  ?  ?History of colon polyps.  ?Surveillance colonoscopy due March 2025 ? ?Alternating bowel habits.  ?Chronic diarrhea has resolved. Now stools are sometimes normal, other times she is constipated or even loose bowels at times. Cautioned against becoming constipated as it can lead to encephalopathy in patient's with cirrhosis.  ? ?Moderate aortic stenosis ? ?Plan  ? ?CMET, CBC, INR ?Schedule for LVP with 50 grams IV albumin. Fluid studies for cell count, cytology ?[place] screening: AFP. Obtain abdominal US which also allow [place] to check for splenomegaly. ?EGD for varices surveillance. The risks and benefits of EGD with possible biopsies were discussed with the patient who agrees to proceed.  ?2 gram sodium restricted diet ?Avoid NSAIDS. Can use Tylenol 2 gm daily as needed for any pain.  ?Lactulose 30 grams as needed for constipation.  ? ?GI attending: ? ?Agree with all but now that she is on carvedilol she does not need surveillance endoscopy.  We will cancel that. ? ? ?Gatha Mayer, MD, [facility] ? ? ?HPI  ? ?Chief Complaint : abdominal distention ? ?[name] was last seen February 2022. She was having excessive sleepiness, ammonia was 60. She was not able to get Xifaxan due to cost. However the  sleepiness ( and diarrhea) resolved when she stopped Zoloft several months ago.  ? ?Patient started to get abdominal distention a month or so ago. Had [place] a couple of weeks ago ( PCP ordered)  and moderate ascites was found. She was advised to see [place].   Not adding any salt to diet but otherwise doesn't really keep up with ingested sodium. No SOB. No swelling in legs Uncomfortable with abdominal distention which is causing early satiety.  ? ? ?Previous GI Evaluation  ? ?March 2020 Colonoscopy  ?One diminutive polyp in the transverse colon, removed with a cold snare. Resected and ?retrieved. ?- The examination was otherwise normal on direct and retroflexion views. ?- Personal history of colonic polyps. 2 adenomas max 15 mm 2016 ? ?Path - sessile serrated polyp ? ?April 2022 EGD ?Grade II esophageal varices with no stigmata of recent bleeding. ?- Portal hypertensive gastropathy. ?- Erythematous mucosa in the antrum. Biopsied. Clo was negative ?- The examination was otherwise normal. ? ? ?Imaging: ?June 2022 Echo  ?IMPRESSIONS  ? 1. Left ventricular ejection fraction, by estimation, is 55 to 60%. The  left ventricle has normal function. The left ventricle has no regional wall motion abnormalities. Left ventricular diastolic parameters are  indeterminate.  ? 2. Right ventricular systolic function is normal. The right ventricular  ?size is normal. There is normal pulmonary artery systolic pressure. The  ?estimated right ventricular systolic pressure is 78.2 mmHg.  ? 3. Left atrial size was upper normal.  ? 4. The pericardial effusion is posterior to the left ventricle.  ? 5.  The mitral valve is grossly normal. Mild mitral valve regurgitation.  ? 6. The aortic valve is tricuspid with reduced excursion of the righr and  ?noncoronary cusps predominantly. There is mild calcification of the aortic  ?valve. There is mild thickening of the aortic valve. Aortic valve  ?regurgitation is mild. Moderate to  ?severe, relatively low  gradient aortic valve stenosis. Aortic  ?regurgitation PHT measures 800 msec. Aortic valve mean gradient measures  ?19.7 mmHg. Dimentionless index 0.40.  ? 7. The inferior vena cava is normal in size with greater than 50%  ?respiratory variability, suggesting right atrial pressure of 3 mmHg.  ? ?Labs:  ? ?  Latest Ref Rng & Units 03/08/2022  ? 11:27 AM 11/29/2021  ?  6:30 PM 09/05/2021  ?  8:45 AM  ?CBC  ?WBC 3.4 - 10.8 x10E3/uL 5.5   6.0   5.1    ?Hemoglobin 11.1 - 15.9 g/dL 11.2   13.3   12.5    ?Hematocrit 34.0 - 46.6 % 34.2   38.5   36.5    ?Platelets 150 - 450 x10E3/uL 117   74   60    ? ? ? ?  Latest Ref Rng & Units 09/05/2021  ?  8:45 AM 02/09/2020  ?  7:43 AM 05/27/2018  ? 10:39 AM  ?Hepatic Function  ?Total Protein 6.0 - 8.5 g/dL 6.8   7.0   7.8    ?Albumin 3.8 - 4.8 g/dL 3.9      ?AST 0 - 40 IU/L 29   32   32    ?ALT 0 - 32 IU/L _0 ?Alk Phosphatase 44 - 121 IU/L 47      ?Total Bilirubin 0.0 - 1.2 mg/dL 0.8   0.8   0.6    ? ? ? ?Past Medical History:  ?Diagnosis Date  ? Allergy   ? Anemia, unspecified   ? Anxiety   ? Arthritis   ? Chronic diarrhea   ? Cirrhosis ([place])   ? Depression   ? Depression   ? Elevated cholesterol   ? Enterocolitis due to Clostridium difficile, not specified as recurrent   ? Gastro-esophageal reflux disease with esophagitis   ? Helicobacter pylori gastritis 07/10/2017  ? Dx EGD -  1) Omeprazole 20 mg 2 times a day x 14 d 2) Pepto Bismol 2 tabs (262 mg each) 4 times a day x 14 d 3) Metronidazole 250 mg 4 times a day x 14 d 4) doxycycline 100 mg 2 times a day x 14 d  After 14 d stop omeprazole also  In 4 weeks after treatment completed do H. Pylori stool antigen - dx H. Pylori gastritis   ? Hx of adenomatous colonic polyps 10/05/2015  ? Hyperlipemia 02/21/2009  ? NUC STRESS TEST-NORMAL- EF 74%  ? Hypertension   ? Iron deficiency anemia   ? Irritable bowel syndrome   ? Localized edema   ? Major depressive disorder, single episode, unspecified   ? MVP (mitral valve prolapse)   ?  Rectocele   ? Thrombocytopenia ([place])   ? Type 2 diabetes mellitus with hyperglycemia (HCC)   ? ? ?Past Surgical History:  ?Procedure Laterality Date  ? ABDOMINAL HYSTERECTOMY    ? BILATERAL SALPINGOOPHORECTOMY    ? carpel tunnel Bilateral 12/17/2009  ? COLONOSCOPY    ? ESOPHAGOGASTRODUODENOSCOPY    ? TUBAL LIGATION    ? ? ?Current Medications, Allergies, Family History and Social History were  reviewed in [place] record. ?  ?  ?Current Outpatient Medications  ?Medication Sig Dispense Refill  ? amLODipine (NORVASC) 5 MG tablet Take 1 tablet (5 mg total) by mouth daily. 90 tablet 3  ? BIOTIN PO Take 500 mg by mouth daily.    ? carvedilol (COREG) 25 MG tablet Take 1 tablet (25 mg total) by mouth 2 (two) times daily. 180 tablet 3  ? ezetimibe (ZETIA) 10 MG tablet Take 1 tablet (10 mg total) by mouth daily. 90 tablet 3  ? ferrous sulfate 325 (65 FE) MG tablet Take 325 mg by mouth daily with breakfast.    ? FLUoxetine (PROZAC) 20 MG capsule TAKE 1 CAPSULE BY MOUTH EVERY DAY 90 capsule 1  ? glipiZIDE (GLUCOTROL) 10 MG tablet Take 10 mg by mouth daily before breakfast. 1 Tablet Daily Before Breakfast    ? Glucagon (GVOKE HYPOPEN 2-PACK) 1 MG/0.2ML SOAJ Inject 0.2 mg into the skin as needed (For low blood glucose < 55). 0.4 mL 11  ? hydrALAZINE (APRESOLINE) 25 MG tablet Take 1 tablet (25 mg total) by mouth 3 (three) times daily as needed (For Systolic over 272). 90 tablet 0  ? insulin glargine, 1 Unit Dial, (TOUJEO SOLOSTAR) 300 UNIT/ML Solostar Pen Inject 60 Units into the skin 1 day or 1 dose for 1 dose. 6 mL 2  ? Insulin Pen Needle 32G X 4 MM MISC 1 each by Does not apply route 4 (four) times daily. 150 each 5  ? irbesartan (AVAPRO) 150 MG tablet Take 1 tablet (150 mg total) by mouth daily. 90 tablet 3  ? ondansetron (ZOFRAN) 4 MG tablet Take 1 tablet (4 mg total) by mouth every 8 (eight) hours as needed for nausea or vomiting. 20 tablet 0  ? rosuvastatin (CRESTOR) 5 MG tablet Take 5 mg by mouth  daily. Takes one on Monday, Wed., and Friday    ? triamcinolone cream (KENALOG) 0.1 % APPLY TO AFFECTED AREA TWICE A DAY 30 g 0  ? ?Current Facility-Administered Medications  ?Medication Dose Route Frequency

## 2022-04-26 ENCOUNTER — Telehealth: Payer: Self-pay | Admitting: *Deleted

## 2022-04-26 ENCOUNTER — Telehealth: Payer: Self-pay | Admitting: Nurse Practitioner

## 2022-04-26 NOTE — Telephone Encounter (Signed)
[name] [name], ? ?This pt is cleared for anesthetic care at [facility].  ? ?Thanks, ? ?Osvaldo Angst ?

## 2022-04-26 NOTE — Telephone Encounter (Signed)
Patient was switched from Atenolol to Coreg about one month ago. Given this she doesn't need an EGD for varices surveillance. I went over th with her husband and told him that I would cancel the procedure, I also advised him to let us know should she ever stopped taking this particular medication as this may change our plans for varices surveillance. ?

## 2022-04-27 LAB — AFP TUMOR MARKER: AFP-Tumor Marker: 2.9 ng/mL

## 2022-05-02 ENCOUNTER — Other Ambulatory Visit (HOSPITAL_COMMUNITY): Payer: Medicare HMO

## 2022-05-02 ENCOUNTER — Ambulatory Visit (HOSPITAL_COMMUNITY): Payer: Medicare HMO

## 2022-05-03 ENCOUNTER — Ambulatory Visit (HOSPITAL_COMMUNITY)
Admission: RE | Admit: 2022-05-03 | Discharge: 2022-05-03 | Disposition: A | Discharge status: Home / Self Care | Payer: Medicare HMO | Source: Ambulatory Visit | Attending: Nurse Practitioner | Admitting: Nurse Practitioner

## 2022-05-03 DIAGNOSIS — R188 Other ascites: Secondary | ICD-10-CM | POA: Insufficient documentation

## 2022-05-03 DIAGNOSIS — K7460 Unspecified cirrhosis of liver: Secondary | ICD-10-CM | POA: Insufficient documentation

## 2022-05-03 DIAGNOSIS — K8020 Calculus of gallbladder without cholecystitis without obstruction: Secondary | ICD-10-CM | POA: Diagnosis not present

## 2022-05-03 DIAGNOSIS — N281 Cyst of kidney, acquired: Secondary | ICD-10-CM | POA: Diagnosis not present

## 2022-05-03 DIAGNOSIS — K7689 Other specified diseases of liver: Secondary | ICD-10-CM | POA: Diagnosis not present

## 2022-05-03 HISTORY — PX: IR PARACENTESIS: IMG2679

## 2022-05-03 LAB — BODY FLUID CELL COUNT WITH DIFFERENTIAL
Eos, Fluid: 0 %
Lymphs, Fluid: 40 %
Monocyte-Macrophage-Serous Fluid: 52 % (ref 50–90)
Neutrophil Count, Fluid: 8 % (ref 0–25)
Total Nucleated Cell Count, Fluid: 214 cu mm (ref 0–1000)

## 2022-05-03 LAB — PROTEIN, PLEURAL OR PERITONEAL FLUID: Total protein, fluid: <3 g/dL

## 2022-05-03 LAB — ALBUMIN, PLEURAL OR PERITONEAL FLUID: Albumin, Fluid: <1.5 g/dL

## 2022-05-03 MED ORDER — LIDOCAINE HCL 1 % IJ SOLN
INTRAMUSCULAR | Status: AC
Start: 2022-05-03 — End: 2022-05-03
  Administered 2022-05-03: 10 mL
  Filled 2022-05-03: qty 20

## 2022-05-03 NOTE — Procedures (Signed)
PROCEDURE SUMMARY:  Successful ultrasound guided paracentesis from the RLQ  Yielded 2.5L of straw-colored fluid.  No immediate complications.  The patient tolerated the procedure well.   Specimen was sent for labs.  EBL < 74m  If the patient eventually requires >/=2 paracenteses in a 30 day period, screening evaluation by [facility] will be assessed.   Electronically Signed: HPasty Spillers PA-C 05/03/2022, 10:17 AM

## 2022-05-05 LAB — CYTOLOGY - NON PAP

## 2022-05-30 ENCOUNTER — Telehealth: Payer: Self-pay

## 2022-05-30 ENCOUNTER — Ambulatory Visit (HOSPITAL_COMMUNITY): Admission: RE | Admit: 2022-05-30 | Payer: Medicare HMO | Source: Ambulatory Visit

## 2022-05-30 ENCOUNTER — Other Ambulatory Visit: Payer: Self-pay | Admitting: *Deleted

## 2022-05-30 DIAGNOSIS — K2100 Gastro-esophageal reflux disease with esophagitis, without bleeding: Secondary | ICD-10-CM

## 2022-05-30 DIAGNOSIS — R188 Other ascites: Secondary | ICD-10-CM

## 2022-05-30 DIAGNOSIS — K529 Noninfective gastroenteritis and colitis, unspecified: Secondary | ICD-10-CM

## 2022-05-30 NOTE — Telephone Encounter (Signed)
Called patient spouse

## 2022-05-30 NOTE — Telephone Encounter (Signed)
Patient spouse Louie Casa called would like to change Gastro doctor to a Dr Laural Golden for her stomach. Still swelling. Like to see Dr Laural Golden office.

## 2022-05-30 NOTE — Telephone Encounter (Signed)
Referral placed for [name] [name] please let patient know

## 2022-06-08 ENCOUNTER — Telehealth: Payer: Self-pay | Admitting: Internal Medicine

## 2022-06-08 ENCOUNTER — Ambulatory Visit (INDEPENDENT_AMBULATORY_CARE_PROVIDER_SITE_OTHER): Payer: Medicare HMO | Admitting: Internal Medicine

## 2022-06-08 ENCOUNTER — Other Ambulatory Visit: Payer: Self-pay | Admitting: *Deleted

## 2022-06-08 ENCOUNTER — Encounter: Payer: Self-pay | Admitting: Internal Medicine

## 2022-06-08 VITALS — BP 128/58 | HR 54 | Resp 18 | Ht 63.0 in | Wt 133.0 lb

## 2022-06-08 DIAGNOSIS — R188 Other ascites: Secondary | ICD-10-CM | POA: Diagnosis not present

## 2022-06-08 DIAGNOSIS — E1165 Type 2 diabetes mellitus with hyperglycemia: Secondary | ICD-10-CM | POA: Diagnosis not present

## 2022-06-08 DIAGNOSIS — F331 Major depressive disorder, recurrent, moderate: Secondary | ICD-10-CM

## 2022-06-08 DIAGNOSIS — Z794 Long term (current) use of insulin: Secondary | ICD-10-CM | POA: Diagnosis not present

## 2022-06-08 DIAGNOSIS — K3189 Other diseases of stomach and duodenum: Secondary | ICD-10-CM

## 2022-06-08 DIAGNOSIS — K7460 Unspecified cirrhosis of liver: Secondary | ICD-10-CM | POA: Diagnosis not present

## 2022-06-08 MED ORDER — FLUOXETINE HCL 40 MG PO CAPS: 40.0000 mg | ORAL_CAPSULE | Freq: Every day | ORAL | 1 refills | Status: DC

## 2022-06-08 MED ORDER — FUROSEMIDE 20 MG PO TABS
20.0000 mg | ORAL_TABLET | Freq: Every day | ORAL | 3 refills | Status: DC
Start: 2022-06-08 — End: 2022-06-14

## 2022-06-08 NOTE — Telephone Encounter (Signed)
New referral placed.

## 2022-06-08 NOTE — Telephone Encounter (Signed)
Per [facility], referral needs to be resent stating pt no longer wants to be seen by [facility] & they will call her to schedule appt.

## 2022-06-12 ENCOUNTER — Encounter: Payer: Medicare HMO | Admitting: Internal Medicine

## 2022-06-13 ENCOUNTER — Ambulatory Visit (HOSPITAL_COMMUNITY)
Admission: RE | Admit: 2022-06-13 | Discharge: 2022-06-13 | Disposition: A | Discharge status: Home / Self Care | Payer: Medicare HMO | Source: Ambulatory Visit | Attending: Cardiovascular Disease | Admitting: Cardiovascular Disease

## 2022-06-13 ENCOUNTER — Encounter (INDEPENDENT_AMBULATORY_CARE_PROVIDER_SITE_OTHER): Payer: Self-pay | Admitting: *Deleted

## 2022-06-13 DIAGNOSIS — I350 Nonrheumatic aortic (valve) stenosis: Secondary | ICD-10-CM | POA: Insufficient documentation

## 2022-06-13 LAB — ECHOCARDIOGRAM COMPLETE
AR max vel: 1.08 cm2
AV Area VTI: 1.11 cm2
AV Area mean vel: 1.1 cm2
AV Mean grad: 25 mmHg
AV Peak grad: 44.8 mmHg
Ao pk vel: 3.35 m/s
Area-P 1/2: 3.17 cm2
P 1/2 time: 680 msec
S' Lateral: 2.8 cm

## 2022-06-13 NOTE — Progress Notes (Signed)
*  PRELIMINARY RESULTS* Echocardiogram 2D Echocardiogram has been performed.  [name] [name] 06/13/2022, 11:20 AM

## 2022-06-14 ENCOUNTER — Ambulatory Visit: Payer: Medicare HMO | Admitting: Cardiovascular Disease

## 2022-06-14 ENCOUNTER — Encounter: Payer: Self-pay | Admitting: Cardiovascular Disease

## 2022-06-14 VITALS — BP 120/54 | HR 56 | Ht 63.0 in | Wt 131.2 lb

## 2022-06-14 DIAGNOSIS — K7460 Unspecified cirrhosis of liver: Secondary | ICD-10-CM | POA: Diagnosis not present

## 2022-06-14 DIAGNOSIS — I10 Essential (primary) hypertension: Secondary | ICD-10-CM

## 2022-06-14 DIAGNOSIS — R188 Other ascites: Secondary | ICD-10-CM

## 2022-06-14 DIAGNOSIS — E782 Mixed hyperlipidemia: Secondary | ICD-10-CM | POA: Diagnosis not present

## 2022-06-14 DIAGNOSIS — E119 Type 2 diabetes mellitus without complications: Secondary | ICD-10-CM

## 2022-06-14 DIAGNOSIS — R002 Palpitations: Secondary | ICD-10-CM

## 2022-06-14 DIAGNOSIS — I350 Nonrheumatic aortic (valve) stenosis: Secondary | ICD-10-CM | POA: Diagnosis not present

## 2022-06-14 MED ORDER — FUROSEMIDE 40 MG PO TABS: 40.0000 mg | ORAL_TABLET | Freq: Every day | ORAL | 1 refills | Status: DC

## 2022-06-14 MED ORDER — SPIRONOLACTONE 25 MG PO TABS: 25.0000 mg | ORAL_TABLET | Freq: Every day | ORAL | 1 refills | Status: DC

## 2022-06-14 NOTE — Patient Instructions (Signed)
Medication Instructions:  STOP the Irbesartan  START Spironolactone 25 mg once daily  INCREASE the Furosemide 40 mg once daily  *If you need a refill on your cardiac medications before your next appointment, please call your pharmacy*   Lab Work: Your provider would like for you to return in 10 days to have the following labs drawn: BMET. You do not need an appointment for the lab. Once in our office lobby there is a podium where you can sign in and ring the doorbell to alert [place] that you are here. The lab is open from 8:00 am to 4 pm; closed for lunch from 12:45pm-1:45pm.  You may also go to any of these [facility] locations:   [place] ([place]) - [place] [address]   [place] - [address] - [address] [place] - [address] ([facility]  If you have labs (blood work) drawn today and your tests are completely normal, you will receive your results only by: [employer] (if you have MyChart) OR A paper copy in the mail If you have any lab test that is abnormal or we need to change your treatment, we will call you to review the results.   Testing/Procedures: None ordered   Follow-Up: At [facility], you and your health needs are our priority.  As part of our continuing mission to provide you with exceptional heart care, we have created designated Provider Care Teams.  These Care Teams include your primary Cardiologist (physician) and Advanced Practice Providers (APPs -  Physician Assistants and Nurse Practitioners) who all work together to provide you with the care you need, when you need it.  We recommend signing up for the patient portal called "MyChart".  Sign up information is provided on this After Visit Summary.  MyChart is used to connect with patients for Virtual Visits (Telemedicine).  Patients are able to view  lab/test results, encounter notes, upcoming appointments, etc.  Non-urgent messages can be sent to your provider as well.   To learn more about what you can do with MyChart, go to [email or web address].    Your next appointment:   6 month(s)  The format for your next appointment:   In Person  Provider:   Sanda Klein, MD {   Important Information About Sugar

## 2022-06-14 NOTE — Progress Notes (Signed)
Cardiology Office Note:    Date:  06/19/2022   ID:  [name] [name], DOB 17-Aug-1951, MRN [id number]  PCP:  Lindell Spar, MD   [facility] Providers Cardiologist:  Sanda Klein, MD  Referring MD: Lindell Spar, MD   Chief Complaint  Patient presents with   Cardiac Valve Problem     History of Present Illness:    [name] [name] is a 71 y.o. 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  She has not had any cardiac problems since her last appointment.  She is not complaining much of palpitations.  Her biggest issues are related to abdominal distention probably due to ascites which leads to early satiety.  She had paracentesis with removal of 2.5 L on May 18 and states that "it came back within a week".  She has not had any bleeding.  She denies symptoms of hepatic encephalopathy.  She has not had chest pain or shortness of breath at rest or with activity.  She reports that at home her typical heart rate is in the 50s and 85Y and her diastolic blood pressure tends to be in the mid to high 50s.  Her systolic blood pressure is usually in the 100s, never higher than 140.  She has not had dizziness or syncope but does complain of some fatigue.  She has a history of cough with ACE inhibitors.  She has cirrhosis and grade 2 esophageal varices and portal hypertensive gastropathy.  Had a remote history of statin intolerance, but doing OK on rosuvastatin 5 mg 3 days a week. Had a normal nuclear stress test in 2010.  Echocardiogram in 2019 showed aortic valve sclerosis with mild insufficiency but no stenosis.  LVEF 60 to 65%.  Follow-up echo in June 2022 showed moderate aortic stenosis mean transvalvular gradient of 20 mmHg and dimensionless index 0.40.   Echocardiogram in June 2023 continues to show moderate aortic stenosis, but the mean gradient has increased to 25 mmHg.  Dimensionless index 0.39.  Calculated aortic valve area 1.1 cm.  Past Medical History:  Diagnosis Date   Allergy    Anemia, unspecified    Anxiety    Arthritis    Chronic diarrhea    Cirrhosis (HCC)    Depression    Depression    Elevated cholesterol    Enterocolitis due to Clostridium difficile, not specified as recurrent    Gastro-esophageal reflux disease with esophagitis    Helicobacter pylori gastritis 07/10/2017   Dx EGD -  1) Omeprazole 20 mg 2 times a day x 14 d 2) Pepto Bismol 2 tabs (262 mg each) 4 times a day x 14 d 3) Metronidazole 250 mg 4 times a day x 14 d 4) doxycycline 100 mg 2 times a day x 14 d  After 14 d stop omeprazole also  In 4 weeks after treatment completed do H. Pylori stool antigen - dx H. Pylori gastritis    Hx of adenomatous colonic polyps 10/05/2015   Hyperlipemia 02/21/2009   NUC STRESS TEST-NORMAL- EF 74%   Hypertension    Iron deficiency anemia    Irritable bowel syndrome    Localized edema    Major depressive disorder, single episode, unspecified    MVP (mitral valve prolapse)    Rectocele    Thrombocytopenia (HCC)    Type  2 diabetes mellitus with hyperglycemia [facility])     Past Surgical History:  Procedure Laterality Date   ABDOMINAL HYSTERECTOMY     BILATERAL SALPINGOOPHORECTOMY     carpel tunnel Bilateral 12/17/2009   COLONOSCOPY     ESOPHAGOGASTRODUODENOSCOPY     IR PARACENTESIS  05/03/2022   TUBAL LIGATION      Current Medications: Current Meds  Medication Sig   amLODipine (NORVASC) 5 MG tablet Take 1 tablet (5 mg total) by mouth daily.   BIOTIN PO Take 500 mg by mouth daily.   carvedilol (COREG) 25 MG tablet Take 1 tablet (25 mg total) by mouth 2 (two) times daily.   FLUoxetine (PROZAC) 40 MG capsule Take 1 capsule (40 mg total) by mouth daily.   glipiZIDE (GLUCOTROL) 10 MG tablet Take 10 mg by mouth daily before  breakfast. 1 Tablet Daily Before Breakfast   ondansetron (ZOFRAN) 4 MG tablet Take 1 tablet (4 mg total) by mouth every 8 (eight) hours as needed for nausea or vomiting.   rosuvastatin (CRESTOR) 5 MG tablet Take 5 mg by mouth daily. Takes one on Monday, Wed., and Friday   spironolactone (ALDACTONE) 25 MG tablet Take 1 tablet (25 mg total) by mouth daily.   [DISCONTINUED] furosemide (LASIX) 20 MG tablet Take 1 tablet (20 mg total) by mouth daily.   [DISCONTINUED] irbesartan (AVAPRO) 150 MG tablet Take 1 tablet (150 mg total) by mouth daily.   Current Facility-Administered Medications for the 06/14/22 encounter (Office Visit) with Paulette Lynch, Dani Gobble, MD  Medication   0.9 %  sodium chloride infusion   albumin human 25 % solution 50 g     Allergies:   Codeine and Spironolactone   Social History   Socioeconomic History   Marital status: Married    Spouse name: Louie Casa   Number of children: 2   Years of education: Not on file   Highest education level: Not on file  Occupational History   Not on file  Tobacco Use   Smoking status: Former    Years: 30.00    Types: Cigarettes    Quit date: 09/03/1997    Years since quitting: 24.8   Smokeless tobacco: Former    Types: Snuff    Quit date: 05/2022   Tobacco comments:    call when ready to quit    06/14/2022 patient stated she quit 5 or so weeks ago  Vaping Use   Vaping Use: Never used  Substance and Sexual Activity   Alcohol use: No    Alcohol/week: 0.0 standard drinks of alcohol   Drug use: No   Sexual activity: Not on file  Other Topics Concern   Not on file  Social History Narrative   Married 2 sons 1 lives in [place] 1 lives in [place]   She is a housewife   No alcohol tobacco or drug use at this time   Former smoker quit in [place] Strain: [place]  (08/19/2021)   Overall Financial Resource Strain (CARDIA)    Difficulty of Paying Living Expenses: Not hard at all   Food Insecurity: No [place] (08/19/2021)   Hunger Vital Sign    Worried About Running Out of Food in the Last Year: Never true    [place] in the Last Year: Never true  Transportation Needs: No Transportation Needs (08/19/2021)   PRAPARE - Transportation    Lack of Transportation (Medical): No    Lack of  Transportation (Non-Medical): No  Physical Activity: Sufficiently Active (08/19/2021)   Exercise Vital Sign    Days of Exercise per Week: 5 days    Minutes of Exercise per Session: 30 min  Stress: No Stress Concern Present (08/19/2021)   [place]    Feeling of Stress : Not at all  Social Connections: Moderately Integrated (08/19/2021)   Social Connection and Isolation Panel [NHANES]    Frequency of Communication with Friends and Family: More than three times a week    Frequency of Social Gatherings with Friends and Family: More than three times a week    Attends Religious Services: More than 4 times per year    Active Member of [employer] or Organizations: No    Attends Archivist Meetings: Never    Marital Status: Married     Family History: The patient's family history includes ADD / ADHD in her brother; Alcohol abuse in her brother; Anxiety disorder in her maternal aunt, maternal grandmother, and mother; Bipolar disorder in an other family member; Cirrhosis in her sister; Diabetes in her maternal uncle, sister, and sister; Drug abuse in her brother; Emphysema in her father; Hyperlipidemia in her sister and sister. There is no history of Dementia, OCD, Paranoid behavior, Physical abuse, Schizophrenia, Seizures, Sexual abuse, Colon cancer, Colon polyps, Pancreatic cancer, Esophageal cancer, Stomach cancer, Kidney disease, Liver disease, or Rectal cancer.  ROS:   Please see the history of present illness.     All other systems reviewed and are negative.  EKGs/Labs/Other Studies Reviewed:    The following  studies were reviewed today: Echo 06/13/2022   1. Left ventricular ejection fraction, by estimation, is 60 to 65%. The  left ventricle has normal function. The left ventricle has no regional  wall motion abnormalities. Left ventricular diastolic parameters are  consistent with Grade I diastolic  dysfunction (impaired relaxation). Elevated left atrial pressure.   2. Right ventricular systolic function is normal. The right ventricular  size is normal. There is normal pulmonary artery systolic pressure.   3. Left atrial size was mild to moderately dilated.   4. The mitral valve is normal in structure. Trivial mitral valve  regurgitation. No evidence of mitral stenosis.   5. The tricuspid valve is abnormal.   6. The aortic valve is tricuspid. There is mild calcification of the  aortic valve. There is mild thickening of the aortic valve. Aortic valve  regurgitation is moderate. Moderate aortic valve stenosis. Aortic valve  mean gradient measures 25.0 mmHg.  Aortic valve peak gradient measures 44.8 mmHg. Aortic valve area, by VTI  measures 1.11 cm.   7. The inferior vena cava is normal in size with greater than 50%  respiratory variability, suggesting right atrial pressure of 3 mmHg.   EKG:  EKG is ordered today.  It shows mild sinus bradycardia 56 bpm that is otherwise normal, QTc 457 ms.  Recent Labs: 04/25/2022: ALT 9; BUN 25; Creatinine, Ser 1.16; Hemoglobin 10.8; Platelets 152.0; Potassium 5.0; Sodium 140  Recent Lipid Panel    Component Value Date/Time   CHOL 214 (H) 09/05/2021 0845   TRIG 93 09/05/2021 0845   HDL 54 09/05/2021 0845   CHOLHDL 4.0 09/05/2021 0845   CHOLHDL 2.9 04/16/2018 0732   VLDL 27 09/13/2014 0730   LDLCALC 143 (H) 09/05/2021 0845   LDLCALC 105 (H) 04/16/2018 0732     Risk Assessment/Calculations:       Physical Exam:    VS:  BP (!) 120/54 (BP Location: Left Arm, Patient Position: Sitting, Cuff Size: Normal)   Pulse (!) 56   Ht '5\' 3"'$  (1.6 m)   Wt  131 lb 3.2 oz (59.5 kg)   SpO2 94%   BMI 23.24 kg/m     Wt Readings from Last 3 Encounters:  06/14/22 131 lb 3.2 oz (59.5 kg)  06/08/22 133 lb (60.3 kg)  04/25/22 138 lb (62.6 kg)     General: Alert, oriented x3, no distress, no pallor or jaundice Head: no evidence of trauma, PERRL, EOMI, no exophtalmos or lid lag, no myxedema, no xanthelasma; normal ears, nose and oropharynx Neck: normal jugular venous pulsations and no hepatojugular reflux; brisk carotid pulses without delay and no carotid bruits Chest: clear to auscultation, no signs of consolidation by percussion or palpation, normal fremitus, symmetrical and full respiratory excursions Cardiovascular: normal position and quality of the apical impulse, regular rhythm, normal first and second heart sounds, no diastolic murmurs, rubs or gallops.  There is a grade 3/6 early peaking murmur that radiates to the carotids as well as towards the apex in the back Abdomen: Mild ascites, no masses by palpation, no abnormal pulsatility or arterial bruits, normal bowel sounds, no hepatosplenomegaly Extremities: no clubbing, cyanosis or edema; 2+ radial, ulnar and brachial pulses bilaterally; 2+ right femoral, posterior tibial and dorsalis pedis pulses; 2+ left femoral, posterior tibial and dorsalis pedis pulses; no subclavian or femoral bruits Neurological: grossly nonfocal Psych: Normal mood and affect    ASSESSMENT:    1. Aortic valve stenosis, nonrheumatic   2. Mixed hyperlipidemia   3. Essential hypertension, benign   4. Cirrhosis of liver with ascites, unspecified hepatic cirrhosis type ([place])   5. Controlled type 2 diabetes mellitus without complication, without long-term current use of insulin (HCC)   6. Palpitations      PLAN:    In order of problems listed above:  AS: Remains asymptomatic, moderate by recent echo with very slight progression.  Gradients may exaggerate the severity of aortic stenosis due to high cardiac output of  cirrhosis.  Recheck echocardiogram yearly. HLP: She does have evidence of aortic atherosclerosis but no clinically evident CAD or PAD.  Baseline LDL is 201 so 50% reduction would be considered satisfactory and we achieve this in the past.  Most recent LDL is higher.  Make sure she is taking the rosuvastatin 3 times weekly she did not tolerate daily rosuvastatin. HTN: Blood pressure control is good, may be even excessive.  Prefer to use nonselective beta-blockers for their effect on portal hypertension and spironolactone to help decrease the ascites. We will stop the irbesartan in order to start spironolactone.   Cirrhosis: Presumed to be secondary to nonalcoholic steatohepatitis.  She has evidence of significant portal hypertension with esophageal varices, splenomegaly with thrombocytopenia and ascites.  Ascites reaccumulated fairly quickly after paracentesis.  We will increase the dose of furosemide to 40 mg daily and add spironolactone 25 mg daily.  Basic metabolic panel after about a couple of weeks.  Currently without obvious symptoms of hepatic encephalopathy.  She is on treatment with rifaximin.  Need to avoid hepatotoxic drugs.  She does not drink alcohol.  Previous evaluation for viral hepatitis, alpha-1 antitrypsin deficiency and hemochromatosis with negative results.  Has followed up with Dr. Carlean Purl in [place] and Dr. Laural Golden in [place] DM: Substantially improved control with recent hemoglobin A1c of only 5.3%, compared to 7.5% a year ago.  No longer on any medications for diabetes. Palpitations: Seem to be  controlled on beta-blockers        Medication Adjustments/Labs and Tests Ordered: Current medicines are reviewed at length with the patient today.  Concerns regarding medicines are outlined above.  Orders Placed This Encounter  Procedures   Basic metabolic panel   EKG [id number]   Meds ordered this encounter  Medications   furosemide (LASIX) 40 MG tablet    Sig: Take 1 tablet (40  mg total) by mouth daily.    Dispense:  90 tablet    Refill:  1   spironolactone (ALDACTONE) 25 MG tablet    Sig: Take 1 tablet (25 mg total) by mouth daily.    Dispense:  90 tablet    Refill:  1    Patient Instructions  Medication Instructions:  STOP the Irbesartan  START Spironolactone 25 mg once daily  INCREASE the Furosemide 40 mg once daily  *If you need a refill on your cardiac medications before your next appointment, please call your pharmacy*   Lab Work: Your provider would like for you to return in 10 days to have the following labs drawn: BMET. You do not need an appointment for the lab. Once in our office lobby there is a podium where you can sign in and ring the doorbell to alert [place] that you are here. The lab is open from 8:00 am to 4 pm; closed for lunch from 12:45pm-1:45pm.  You may also go to any of these [facility] locations:   [place] ([place]) - [place] [address]   [place] - [address] - [address] [place] - [address] ([facility]  If you have labs (blood work) drawn today and your tests are completely normal, you will receive your results only by: [employer] (if you have MyChart) OR A paper copy in the mail If you have any lab test that is abnormal or we need to change your treatment, we will call you to review the results.   Testing/Procedures: None ordered   Follow-Up: At [facility], you and your health needs are our priority.  As part of our continuing mission to provide you with exceptional heart care, we have created designated Provider Care Teams.  These Care Teams include your primary Cardiologist (physician) and Advanced Practice Providers (APPs -  Physician Assistants and Nurse Practitioners) who all work together to provide you with the care you need, when you need it.  We  recommend signing up for the patient portal called "MyChart".  Sign up information is provided on this After Visit Summary.  MyChart is used to connect with patients for Virtual Visits (Telemedicine).  Patients are able to view lab/test results, encounter notes, upcoming appointments, etc.  Non-urgent messages can be sent to your provider as well.   To learn more about what you can do with MyChart, go to [email or web address].    Your next appointment:   6 month(s)  The format for your next appointment:   In Person  Provider:   Sanda Klein, MD {   Important Information About Sugar         Signed, Sanda Klein, MD  06/19/2022 2:23 PM    [place]

## 2022-06-19 ENCOUNTER — Encounter: Payer: Self-pay | Admitting: Cardiovascular Disease

## 2022-06-25 ENCOUNTER — Encounter: Payer: Self-pay | Admitting: *Deleted

## 2022-06-26 ENCOUNTER — Other Ambulatory Visit: Payer: Self-pay | Admitting: Internal Medicine

## 2022-06-26 DIAGNOSIS — I10 Essential (primary) hypertension: Secondary | ICD-10-CM

## 2022-06-28 DIAGNOSIS — I10 Essential (primary) hypertension: Secondary | ICD-10-CM | POA: Diagnosis not present

## 2022-06-29 LAB — BASIC METABOLIC PANEL
BUN/Creatinine Ratio: 23 (ref 12–28)
BUN: 25 mg/dL (ref 8–27)
CO2: 22 mmol/L (ref 20–29)
Calcium: 8.9 mg/dL (ref 8.7–10.3)
Chloride: 104 mmol/L (ref 96–106)
Creatinine, Ser: 1.09 mg/dL — ABNORMAL HIGH (ref 0.57–1.00)
Glucose: 118 mg/dL — ABNORMAL HIGH (ref 70–99)
Potassium: 4.3 mmol/L (ref 3.5–5.2)
Sodium: 139 mmol/L (ref 134–144)
eGFR: 55 mL/min/{1.73_m2} — ABNORMAL LOW (ref >59)

## 2022-07-02 ENCOUNTER — Encounter: Payer: Self-pay | Admitting: *Deleted

## 2022-08-01 ENCOUNTER — Other Ambulatory Visit: Payer: Self-pay | Admitting: Internal Medicine

## 2022-08-01 DIAGNOSIS — K7460 Unspecified cirrhosis of liver: Secondary | ICD-10-CM

## 2022-08-01 DIAGNOSIS — R110 Nausea: Secondary | ICD-10-CM

## 2022-08-09 ENCOUNTER — Encounter (INDEPENDENT_AMBULATORY_CARE_PROVIDER_SITE_OTHER): Payer: Self-pay | Admitting: Gastroenterology

## 2022-08-09 ENCOUNTER — Ambulatory Visit (INDEPENDENT_AMBULATORY_CARE_PROVIDER_SITE_OTHER): Payer: Medicare HMO | Admitting: Gastroenterology

## 2022-08-09 VITALS — BP 126/60 | HR 54 | Temp 97.8°F | Ht 63.0 in | Wt 124.9 lb

## 2022-08-09 DIAGNOSIS — Z5181 Encounter for therapeutic drug level monitoring: Secondary | ICD-10-CM | POA: Diagnosis not present

## 2022-08-09 DIAGNOSIS — R188 Other ascites: Secondary | ICD-10-CM

## 2022-08-09 DIAGNOSIS — K7460 Unspecified cirrhosis of liver: Secondary | ICD-10-CM | POA: Diagnosis not present

## 2022-08-09 DIAGNOSIS — Z79899 Other long term (current) drug therapy: Secondary | ICD-10-CM

## 2022-08-09 MED ORDER — SPIRONOLACTONE 50 MG PO TABS: 50.0000 mg | ORAL_TABLET | Freq: Every day | ORAL | 1 refills | Status: DC

## 2022-08-09 NOTE — Patient Instructions (Signed)
You are up to date on liver labs and imaging until November Continue your lasix at '40mg'$  daily, I am adding spironolactone '50mg'$  daily to help reduce some of the fluid in your abdomen Please let me know if you have any issues with this or if fluid does not improve Would like to recheck labs one week after starting this Please continue coreg twice daily, this is to help prevent bleeding of the esophgeal varices you have If you have episodes of confusion, forgetfulness, please let me know  - Reduce salt intake to <2 g per day - Can take Tylenol max of 2 g per day (650 mg q8h) for pain - Avoid NSAIDs for pain - Avoid eating raw oysters/shellfish - Ensure every night before going to sleep  Follow up in November
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
## 2022-08-12 DIAGNOSIS — Z5181 Encounter for therapeutic drug level monitoring: Secondary | ICD-10-CM | POA: Insufficient documentation

## 2022-08-13 NOTE — Progress Notes (Signed)
Patient previously recommended to have repeat EGD for esophageal varice screening in April 2023, she was scheduled for this with [facility] who actually cancelled this as patient was on a beta blocker for her varices, therefore repeat EGD was not warranted. I discussed the case with Dr. Jenetta Downer who is in agreement to hold off on further EGD for variceal screening given patient is maintained on beta blocker, unless further decompensating events occur.

## 2022-08-15 ENCOUNTER — Telehealth (INDEPENDENT_AMBULATORY_CARE_PROVIDER_SITE_OTHER): Payer: Self-pay

## 2022-08-15 NOTE — Telephone Encounter (Signed)
Patient called today stating her abdominal swelling is no better. She says she is taking lasix 40 mg per day and Spironolactone 50 mg daily. She says she was told if not better to have a paracentesis. She is not having any shortness of breath or other issues. She has had a paracentesis in the past on 05/03/2022 at [facility]. She says they drew off 2.5 liters at that time. If sitting up again for one would like for it to be arranged at [facility]. Please advise. Thanks

## 2022-08-15 NOTE — Telephone Encounter (Signed)
Patient says her stomach is tight and she is miserable she does want to have the paracentesis.

## 2022-08-16 ENCOUNTER — Other Ambulatory Visit (INDEPENDENT_AMBULATORY_CARE_PROVIDER_SITE_OTHER): Payer: Self-pay

## 2022-08-16 DIAGNOSIS — R188 Other ascites: Secondary | ICD-10-CM

## 2022-08-16 NOTE — Telephone Encounter (Signed)
I called and left a message asked that patient please return call.  ?

## 2022-08-23 ENCOUNTER — Encounter (HOSPITAL_COMMUNITY): Payer: Self-pay

## 2022-08-23 ENCOUNTER — Ambulatory Visit (HOSPITAL_COMMUNITY)
Admission: RE | Admit: 2022-08-23 | Discharge: 2022-08-23 | Disposition: A | Discharge status: Home / Self Care | Payer: Medicare HMO | Source: Ambulatory Visit | Attending: Gastroenterology | Admitting: Gastroenterology

## 2022-08-23 DIAGNOSIS — R188 Other ascites: Secondary | ICD-10-CM | POA: Diagnosis not present

## 2022-08-23 DIAGNOSIS — K7460 Unspecified cirrhosis of liver: Secondary | ICD-10-CM | POA: Insufficient documentation

## 2022-08-23 DIAGNOSIS — K7469 Other cirrhosis of liver: Secondary | ICD-10-CM | POA: Diagnosis not present

## 2022-08-23 LAB — GRAM STAIN

## 2022-08-23 LAB — BODY FLUID CELL COUNT WITH DIFFERENTIAL
Eos, Fluid: 0 %
Lymphs, Fluid: 54 %
Monocyte-Macrophage-Serous Fluid: 41 % — ABNORMAL LOW (ref 50–90)
Neutrophil Count, Fluid: 5 % (ref 0–25)
Total Nucleated Cell Count, Fluid: 231 cu mm (ref 0–1000)

## 2022-08-23 LAB — PROTEIN, PLEURAL OR PERITONEAL FLUID: Total protein, fluid: <3 g/dL

## 2022-08-23 LAB — GLUCOSE, PLEURAL OR PERITONEAL FLUID: Glucose, Fluid: 124 mg/dL

## 2022-08-23 NOTE — Progress Notes (Signed)
Paracentesis complete no signs of distress.  

## 2022-08-23 NOTE — Procedures (Signed)
   US guided RLQ paracentesis 3 liters dark yellow fluid obtained Sent for labs per ordering MD Pt tolerated well  EBL: none

## 2022-08-24 LAB — PATHOLOGIST SMEAR REVIEW

## 2022-08-24 LAB — ACID FAST SMEAR (AFB, MYCOBACTERIA): Acid Fast Smear: NEGATIVE

## 2022-08-27 ENCOUNTER — Other Ambulatory Visit: Payer: Self-pay | Admitting: *Deleted

## 2022-08-27 ENCOUNTER — Telehealth: Payer: Self-pay | Admitting: Internal Medicine

## 2022-08-27 MED ORDER — GLIPIZIDE 10 MG PO TABS: 10.0000 mg | ORAL_TABLET | Freq: Every day | ORAL | 1 refills | Status: DC

## 2022-08-27 NOTE — Telephone Encounter (Signed)
Medication sent to pharmacy  

## 2022-08-27 NOTE — Telephone Encounter (Signed)
Patient needs refill on  glipiZIDE (GLUCOTROL) 10 MG tablet    Also states that bottle says take 2x daily and patient has been only taking 1x daily.

## 2022-08-28 LAB — CULTURE, BODY FLUID W GRAM STAIN -BOTTLE: Culture: NO GROWTH

## 2022-08-30 ENCOUNTER — Other Ambulatory Visit: Payer: Self-pay | Admitting: Internal Medicine

## 2022-08-30 DIAGNOSIS — I10 Essential (primary) hypertension: Secondary | ICD-10-CM

## 2022-08-30 DIAGNOSIS — Z79899 Other long term (current) drug therapy: Secondary | ICD-10-CM | POA: Diagnosis not present

## 2022-08-30 DIAGNOSIS — Z5181 Encounter for therapeutic drug level monitoring: Secondary | ICD-10-CM | POA: Diagnosis not present

## 2022-08-31 LAB — BASIC METABOLIC PANEL
BUN: 15 mg/dL (ref 7–25)
CO2: 27 mmol/L (ref 20–32)
Calcium: 9.2 mg/dL (ref 8.6–10.4)
Chloride: 106 mmol/L (ref 98–110)
Creat: 1.01 mg/dL (ref 0.50–1.05)
Glucose, Bld: 84 mg/dL (ref 65–99)
Potassium: 4.7 mmol/L (ref 3.5–5.3)
Sodium: 140 mmol/L (ref 135–146)

## 2022-09-01 ENCOUNTER — Other Ambulatory Visit (INDEPENDENT_AMBULATORY_CARE_PROVIDER_SITE_OTHER): Payer: Self-pay | Admitting: Gastroenterology

## 2022-09-03 NOTE — Telephone Encounter (Signed)
08/09/2022 last seen

## 2022-09-07 ENCOUNTER — Encounter: Payer: Self-pay | Admitting: Internal Medicine

## 2022-09-07 ENCOUNTER — Ambulatory Visit (INDEPENDENT_AMBULATORY_CARE_PROVIDER_SITE_OTHER): Payer: Medicare HMO | Admitting: Internal Medicine

## 2022-09-07 VITALS — BP 118/52 | HR 50 | Resp 18 | Ht 63.0 in | Wt 126.4 lb

## 2022-09-07 DIAGNOSIS — F331 Major depressive disorder, recurrent, moderate: Secondary | ICD-10-CM | POA: Diagnosis not present

## 2022-09-07 DIAGNOSIS — Z780 Asymptomatic menopausal state: Secondary | ICD-10-CM | POA: Diagnosis not present

## 2022-09-07 DIAGNOSIS — K7460 Unspecified cirrhosis of liver: Secondary | ICD-10-CM

## 2022-09-07 DIAGNOSIS — K766 Portal hypertension: Secondary | ICD-10-CM

## 2022-09-07 DIAGNOSIS — Z1231 Encounter for screening mammogram for malignant neoplasm of breast: Secondary | ICD-10-CM

## 2022-09-07 DIAGNOSIS — K3189 Other diseases of stomach and duodenum: Secondary | ICD-10-CM

## 2022-09-07 DIAGNOSIS — E1169 Type 2 diabetes mellitus with other specified complication: Secondary | ICD-10-CM | POA: Diagnosis not present

## 2022-09-07 DIAGNOSIS — I10 Essential (primary) hypertension: Secondary | ICD-10-CM

## 2022-09-07 DIAGNOSIS — R110 Nausea: Secondary | ICD-10-CM

## 2022-09-07 DIAGNOSIS — R188 Other ascites: Secondary | ICD-10-CM

## 2022-09-07 MED ORDER — ONDANSETRON HCL 4 MG PO TABS: 4.0000 mg | ORAL_TABLET | Freq: Three times a day (TID) | ORAL | 0 refills | Status: AC | PRN

## 2022-09-07 NOTE — Assessment & Plan Note (Signed)
Well-controlled  with Prozac 40 mg QD  Did not tolerate Zoloft - drowsiness and diarrhea Cymbalta was ineffective Did not like effect of Lexapro and Effexor May consider Vraylar if she has persistent uncontrolled depression Referred to [facility] therapy

## 2022-09-07 NOTE — Progress Notes (Addendum)
Established Patient Office Visit  Subjective:  Patient ID: [name] [name], female    DOB: [birth year]  Age: 71 y.o. MRN: [id number]  CC:  Chief Complaint  Patient presents with   Follow-up    3 month follow up pt has fluid on stomach was taking lasix but this makes her bones hurt and feel bad so she stopped taking them     HPI [name] [name] is a 71 y.o. female with past medical history of NASH related liver cirrhosis, portal hypertensive gastropathy, esophageal varices, HTN, AS, DM, HLD, MGUS, iron deficiency anemia, depression and anxiety who presents for f/u of her chronic medical conditions.  She complains of abdominal distention although he had paracentesis on 09/07-removed 3 liters of clear fluid. She denies any nausea or vomiting currently.  Of note, she has history of liver cirrhosis and portal hypertensive gastropathy.  She has stopped Lasix and spironolactone as she was feeling fatigued and dizzy with them.  Of note, her BP was low normal today even without the diuretics.  Depression and anxiety: She was switched to Prozac from Effexor in the last visit.  Today, she still complains of anhedonia, but denies crying spells.  She also has spells of anxiety and insomnia.  Denies SI or HI.  Past Medical History:  Diagnosis Date   Allergy    Anemia, unspecified    Anxiety    Arthritis    Chronic diarrhea    Cirrhosis (HCC)    Depression    Depression    Elevated cholesterol    Enterocolitis due to Clostridium difficile, not specified as recurrent    Gastro-esophageal reflux disease with esophagitis    Helicobacter pylori gastritis 07/10/2017   Dx EGD -  1) Omeprazole 20 mg 2 times a day x 14 d 2) Pepto Bismol 2 tabs (262 mg each) 4 times a day x 14 d 3) Metronidazole 250 mg 4 times a day x 14 d 4) doxycycline 100 mg 2 times a day x 14 d  After 14 d stop omeprazole also  In 4 weeks after treatment completed do H. Pylori stool antigen - dx H. Pylori gastritis    Hx of  adenomatous colonic polyps 10/05/2015   Hyperlipemia 02/21/2009   NUC STRESS TEST-NORMAL- EF 74%   Hypertension    Iron deficiency anemia    Irritable bowel syndrome    Localized edema    Major depressive disorder, single episode, unspecified    MVP (mitral valve prolapse)    Rectocele    Thrombocytopenia (HCC)    Type 2 diabetes mellitus with hyperglycemia ([place])     Past Surgical History:  Procedure Laterality Date   ABDOMINAL HYSTERECTOMY     BILATERAL SALPINGOOPHORECTOMY     carpel tunnel Bilateral 12/17/2009   COLONOSCOPY     ESOPHAGOGASTRODUODENOSCOPY     IR PARACENTESIS  05/03/2022   TUBAL LIGATION      Family History  Problem Relation Age of Onset   Anxiety disorder Mother    ADD / ADHD Brother    Alcohol abuse Brother        x 3   Drug abuse Brother    Anxiety disorder Maternal Aunt    Anxiety disorder Maternal Grandmother    Bipolar disorder Other    Emphysema Father    Diabetes Sister    Hyperlipidemia Sister    Cirrhosis Sister    Hyperlipidemia Sister    Diabetes Sister    Diabetes Maternal Uncle    Dementia  Neg Hx    OCD Neg Hx    Paranoid behavior Neg Hx    Physical abuse Neg Hx    Schizophrenia Neg Hx    Seizures Neg Hx    Sexual abuse Neg Hx    Colon cancer Neg Hx    Colon polyps Neg Hx    Pancreatic cancer Neg Hx    Esophageal cancer Neg Hx    Stomach cancer Neg Hx    Kidney disease Neg Hx    Liver disease Neg Hx    Rectal cancer Neg Hx     Social History   Socioeconomic History   Marital status: Married    Spouse name: Louie Casa   Number of children: 2   Years of education: Not on file   Highest education level: Not on file  Occupational History   Not on file  Tobacco Use   Smoking status: Former    Years: 30.00    Types: Cigarettes    Quit date: 09/03/1997    Years since quitting: 25.0   Smokeless tobacco: Former    Types: Snuff    Quit date: 05/2022   Tobacco comments:    call when ready to quit    06/14/2022 patient stated  she quit 5 or so weeks ago  Vaping Use   Vaping Use: Never used  Substance and Sexual Activity   Alcohol use: No    Alcohol/week: 0.0 standard drinks of alcohol   Drug use: No   Sexual activity: Not on file  Other Topics Concern   Not on file  Social History Narrative   Married 2 sons 1 lives in [place] 1 lives in [place]   She is a housewife   No alcohol tobacco or drug use at this time   Former smoker quit in [place] Determinants of Health   Financial Resource Strain: Low Risk  (08/19/2021)   Overall Financial Resource Strain (CARDIA)    Difficulty of Paying Living Expenses: Not hard at all  Food Insecurity: No Food Insecurity (08/19/2021)   Hunger Vital Sign    Worried About Running Out of Food in the Last Year: Never true    [place] in the Last Year: Never true  Transportation Needs: No Transportation Needs (08/19/2021)   PRAPARE - Hydrologist (Medical): No    Lack of Transportation (Non-Medical): No  Physical Activity: Sufficiently Active (08/19/2021)   Exercise Vital Sign    Days of Exercise per Week: 5 days    Minutes of Exercise per Session: 30 min  Stress: No Stress Concern Present (08/19/2021)   [place]    Feeling of Stress : Not at all  Social Connections: Moderately Integrated (08/19/2021)   Social Connection and Isolation Panel [NHANES]    Frequency of Communication with Friends and Family: More than three times a week    Frequency of Social Gatherings with Friends and Family: More than three times a week    Attends Religious Services: More than 4 times per year    Active Member of [employer] or Organizations: No    Attends Archivist Meetings: Never    Marital Status: Married  Human resources officer Violence: Not At Risk (08/19/2021)   Humiliation, Afraid, Rape, and Kick questionnaire    Fear of Current or Ex-Partner: No    Emotionally Abused: No     Physically Abused: No    Sexually Abused:  No    Outpatient Medications Prior to Visit  Medication Sig Dispense Refill   BIOTIN PO Take 500 mg by mouth daily.     ferrous sulfate 325 (65 FE) MG EC tablet Take 325 mg by mouth. One daily     FLUoxetine (PROZAC) 40 MG capsule Take 1 capsule (40 mg total) by mouth daily. 90 capsule 1   glipiZIDE (GLUCOTROL) 10 MG tablet Take 1 tablet (10 mg total) by mouth daily before breakfast. 1 Tablet Daily Before Breakfast 90 tablet 1   Glucagon (GVOKE HYPOPEN 2-PACK) 1 MG/0.2ML SOAJ Inject 0.2 mg into the skin as needed (For low blood glucose < 55). 0.4 mL 11   rosuvastatin (CRESTOR) 5 MG tablet Take 5 mg by mouth daily. Takes one on Monday, Wed., and Friday     spironolactone (ALDACTONE) 50 MG tablet TAKE 1 TABLET BY MOUTH EVERY DAY 30 tablet 1   triamcinolone cream (KENALOG) 0.1 % APPLY TO AFFECTED AREA TWICE A DAY 30 g 0   ondansetron (ZOFRAN) 4 MG tablet TAKE 1 TABLET BY MOUTH EVERY 8 HOURS AS NEEDED FOR NAUSEA AND VOMITING 20 tablet 0   carvedilol (COREG) 25 MG tablet Take 1 tablet (25 mg total) by mouth 2 (two) times daily. 180 tablet 3   furosemide (LASIX) 40 MG tablet Take 1 tablet (40 mg total) by mouth daily. (Patient not taking: Reported on 09/07/2022) 90 tablet 1   hydrALAZINE (APRESOLINE) 25 MG tablet Take 1 tablet (25 mg total) by mouth 3 (three) times daily as needed (For Systolic over 093). (Patient not taking: Reported on 06/14/2022) 90 tablet 0   amLODipine (NORVASC) 5 MG tablet Take 1 tablet (5 mg total) by mouth daily. 90 tablet 3   insulin glargine, 1 Unit Dial, (TOUJEO SOLOSTAR) 300 UNIT/ML Solostar Pen Inject 60 Units into the skin 1 day or 1 dose for 1 dose. (Patient not taking: Reported on 06/14/2022) 6 mL 2   Facility-Administered Medications Prior to Visit  Medication Dose Route Frequency Provider Last Rate Last Admin   0.9 %  sodium chloride infusion  500 mL Intravenous Once Gatha Mayer, MD        Allergies  Allergen Reactions    Codeine Other (See Comments)    Took it years ago and doesn't remember the reaction   Spironolactone Other (See Comments)    Reports intolerance , states unable to take the medication    ROS Review of Systems  Constitutional:  Positive for fatigue. Negative for chills and fever.  HENT:  Negative for congestion, sinus pressure, sinus pain and sore throat.   Eyes:  Negative for pain and discharge.  Respiratory:  Negative for cough and shortness of breath.   Cardiovascular:  Negative for chest pain and palpitations.  Gastrointestinal:  Positive for abdominal distention. Negative for diarrhea, nausea and vomiting.  Endocrine: Negative for polydipsia and polyuria.  Genitourinary:  Negative for dysuria and hematuria.  Musculoskeletal:  Negative for neck pain and neck stiffness.  Skin:  Negative for rash.  Neurological:  Positive for dizziness. Negative for weakness.  Psychiatric/Behavioral:  Positive for sleep disturbance. Negative for agitation and behavioral problems. The patient is nervous/anxious.       Objective:    Physical Exam Vitals reviewed.  Constitutional:      General: She is not in acute distress.    Appearance: She is not diaphoretic.  HENT:     Head: Normocephalic and atraumatic.     Nose: Nose normal.  Mouth/Throat:     Mouth: Mucous membranes are moist.  Eyes:     General: No scleral icterus.    Extraocular Movements: Extraocular movements intact.  Cardiovascular:     Rate and Rhythm: Normal rate and regular rhythm.     Pulses: Normal pulses.     Heart sounds: Murmur (Systolic over left upper sternal border) heard.  Pulmonary:     Breath sounds: Normal breath sounds. No wheezing or rales.  Abdominal:     General: There is distension.     Palpations: Abdomen is soft.     Hernia: A hernia (Umbilical) is present.  Musculoskeletal:     Cervical back: Neck supple. No tenderness.     Right lower leg: No edema.     Left lower leg: No edema.  Skin:     General: Skin is warm.     Findings: No rash.  Neurological:     General: No focal deficit present.     Mental Status: She is alert and oriented to person, place, and time.  Psychiatric:        Mood and Affect: Mood normal. Mood is not depressed.        Behavior: Behavior normal.     BP (!) 118/52 (BP Location: Left Arm, Patient Position: Sitting, Cuff Size: Normal)   Pulse (!) 50   Resp 18   Ht $R'5\' 3"'Ke$  (1.6 m)   Wt 126 lb 6.4 oz (57.3 kg)   SpO2 96%   BMI 22.39 kg/m  Wt Readings from Last 3 Encounters:  09/07/22 126 lb 6.4 oz (57.3 kg)  08/09/22 124 lb 14.4 oz (56.7 kg)  06/14/22 131 lb 3.2 oz (59.5 kg)    Lab Results  Component Value Date   TSH 2.13 02/09/2020   Lab Results  Component Value Date   WBC 6.5 04/25/2022   HGB 10.8 (L) 04/25/2022   HCT 32.9 (L) 04/25/2022   MCV 81.3 04/25/2022   PLT 152.0 04/25/2022   Lab Results  Component Value Date   NA 140 08/30/2022   K 4.7 08/30/2022   CO2 27 08/30/2022   GLUCOSE 84 08/30/2022   BUN 15 08/30/2022   CREATININE 1.01 08/30/2022   BILITOT 0.8 04/25/2022   ALKPHOS 75 04/25/2022   AST 22 04/25/2022   ALT 9 04/25/2022   PROT 7.9 04/25/2022   ALBUMIN 3.7 04/25/2022   CALCIUM 9.2 08/30/2022   ANIONGAP 7 11/29/2021   EGFR 55 (L) 06/28/2022   GFR 48.42 (L) 04/25/2022   Lab Results  Component Value Date   CHOL 214 (H) 09/05/2021   Lab Results  Component Value Date   HDL 54 09/05/2021   Lab Results  Component Value Date   LDLCALC 143 (H) 09/05/2021   Lab Results  Component Value Date   TRIG 93 09/05/2021   Lab Results  Component Value Date   CHOLHDL 4.0 09/05/2021   Lab Results  Component Value Date   HGBA1C 5.3 03/08/2022      Assessment & Plan:   Problem List Items Addressed This Visit       Cardiovascular and Mediastinum   Essential hypertension, benign - Primary    BP Readings from Last 1 Encounters:  09/07/22 (!) 118/52  Well controlled with amlodipine and Coreg now Low normal BP  currently even without diuretics Discontinue amlodipine Restart Lasix 20 mg daily and spironolactone 25 mg daily for now to help with cirrhosis related ascites Counseled for compliance with the medications Advised DASH diet and  moderate exercise/walking, at least 150 mins/week        Digestive   Cirrhosis of liver with ascites ([place])    Had recent paracentesis - 3 liters of clear fluid removed Has recurrence of ascites, may need repeat paracentesis, advised to contact GI -have sent epic message to her GI provider, will discuss about portacaval shunt versus repeated paracentesis  Recent EGD showed portal hypertensive gastropathy and esophageal varices On B-blocker for ppx Started Lasix 20 mg QD and spironolactone 25 mg QD for now      Portal hypertensive gastropathy ([place])     Endocrine   Type 2 diabetes mellitus with other specified complication ([place])    Lab Results  Component Value Date   HGBA1C 5.3 03/08/2022  Associated with HTN and HLD On Glipizide 10 mg QD only now Mostly well-controlled with some fluctuation up to 190 due to inconsistent diet Did not tolerate Metformin in the past Advised to follow diabetic diet On ACEi and statin F/u CMP and lipid panel Diabetic eye exam: Advised to follow up with Ophthalmology for diabetic eye exam      Relevant Orders   CMP14+EGFR   Hemoglobin A1c     Other   Major depressive disorder, recurrent episode, moderate (HCC)    Well-controlled  with Prozac 40 mg QD  Did not tolerate Zoloft - drowsiness and diarrhea Cymbalta was ineffective Did not like effect of Lexapro and Effexor May consider Vraylar if she has persistent uncontrolled depression Referred to [facility] therapy      Other Visit Diagnoses     Nausea       Relevant Medications   ondansetron (ZOFRAN) 4 MG tablet   Post-menopausal       Relevant Orders   DG Bone Density   Screening mammogram for breast cancer       Relevant Orders   MM 3D SCREEN BREAST BILATERAL        Meds ordered this encounter  Medications   ondansetron (ZOFRAN) 4 MG tablet    Sig: Take 1 tablet (4 mg total) by mouth every 8 (eight) hours as needed for nausea or vomiting.    Dispense:  20 tablet    Refill:  0    Follow-up: Return in about 3 weeks (around 09/28/2022) for HTN and ascites.    Lindell Spar, MD

## 2022-09-07 NOTE — Assessment & Plan Note (Addendum)
BP Readings from Last 1 Encounters:  09/07/22 (!) 118/52   Well controlled with amlodipine and Coreg now Low normal BP currently even without diuretics Discontinue amlodipine Restart Lasix 20 mg daily and spironolactone 25 mg daily for now to help with cirrhosis related ascites Counseled for compliance with the medications Advised DASH diet and moderate exercise/walking, at least 150 mins/week

## 2022-09-07 NOTE — Patient Instructions (Signed)
Please stop taking Amlodipine.  Please take Lasix and Spironolactone only 1/2 tablets once daily.  Please get blood tests done before the next visit.

## 2022-09-07 NOTE — Assessment & Plan Note (Addendum)
Had recent paracentesis - 3 liters of clear fluid removed Has recurrence of ascites, may need repeat paracentesis, advised to contact GI -have sent epic message to her GI provider, will discuss about portacaval shunt versus repeated paracentesis  Recent EGD showed portal hypertensive gastropathy and esophageal varices On B-blocker for ppx Started Lasix 20 mg QD and spironolactone 25 mg QD for now

## 2022-09-07 NOTE — Assessment & Plan Note (Signed)
Lab Results  Component Value Date   HGBA1C 5.3 03/08/2022   Associated with HTN and HLD On Glipizide 10 mg QD only now Mostly well-controlled with some fluctuation up to 190 due to inconsistent diet Did not tolerate Metformin in the past Advised to follow diabetic diet On ACEi and statin F/u CMP and lipid panel Diabetic eye exam: Advised to follow up with Ophthalmology for diabetic eye exam

## 2022-09-18 ENCOUNTER — Ambulatory Visit (INDEPENDENT_AMBULATORY_CARE_PROVIDER_SITE_OTHER): Payer: Medicare HMO | Admitting: Gastroenterology

## 2022-09-24 DIAGNOSIS — E1165 Type 2 diabetes mellitus with hyperglycemia: Secondary | ICD-10-CM | POA: Diagnosis not present

## 2022-09-25 LAB — CMP14+EGFR
ALT: 14 IU/L (ref 0–32)
AST: 31 IU/L (ref 0–40)
Albumin/Globulin Ratio: 0.9 — ABNORMAL LOW (ref 1.2–2.2)
Albumin: 3.6 g/dL — ABNORMAL LOW (ref 3.9–4.9)
Alkaline Phosphatase: 100 IU/L (ref 44–121)
BUN/Creatinine Ratio: 19 (ref 12–28)
BUN: 20 mg/dL (ref 8–27)
Bilirubin Total: 0.6 mg/dL (ref 0.0–1.2)
CO2: 22 mmol/L (ref 20–29)
Calcium: 9.1 mg/dL (ref 8.7–10.3)
Chloride: 103 mmol/L (ref 96–106)
Creatinine, Ser: 1.03 mg/dL — ABNORMAL HIGH (ref 0.57–1.00)
Globulin, Total: 4 g/dL (ref 1.5–4.5)
Glucose: 172 mg/dL — ABNORMAL HIGH (ref 70–99)
Potassium: 4.6 mmol/L (ref 3.5–5.2)
Sodium: 140 mmol/L (ref 134–144)
Total Protein: 7.6 g/dL (ref 6.0–8.5)
eGFR: 59 mL/min/{1.73_m2} — ABNORMAL LOW (ref >59)

## 2022-09-25 LAB — HEMOGLOBIN A1C
Est. average glucose Bld gHb Est-mCnc: 114 mg/dL
Hgb A1c MFr Bld: 5.6 % (ref 4.8–5.6)

## 2022-09-28 ENCOUNTER — Ambulatory Visit (INDEPENDENT_AMBULATORY_CARE_PROVIDER_SITE_OTHER): Payer: Medicare HMO | Admitting: Internal Medicine

## 2022-09-28 ENCOUNTER — Encounter: Payer: Self-pay | Admitting: Internal Medicine

## 2022-09-28 VITALS — BP 124/60 | HR 64 | Resp 18 | Ht 63.0 in | Wt 117.0 lb

## 2022-09-28 DIAGNOSIS — R188 Other ascites: Secondary | ICD-10-CM | POA: Diagnosis not present

## 2022-09-28 DIAGNOSIS — K7460 Unspecified cirrhosis of liver: Secondary | ICD-10-CM

## 2022-09-28 DIAGNOSIS — I10 Essential (primary) hypertension: Secondary | ICD-10-CM | POA: Diagnosis not present

## 2022-09-28 DIAGNOSIS — F331 Major depressive disorder, recurrent, moderate: Secondary | ICD-10-CM | POA: Diagnosis not present

## 2022-09-28 DIAGNOSIS — Z23 Encounter for immunization: Secondary | ICD-10-CM

## 2022-09-28 MED ORDER — CARIPRAZINE HCL 1.5 MG PO CAPS: 1.5000 mg | ORAL_CAPSULE | Freq: Every day | ORAL | 0 refills | Status: DC

## 2022-09-28 NOTE — Assessment & Plan Note (Signed)
BP Readings from Last 1 Encounters:  09/28/22 124/60   Well controlled with Coreg, Lasix and spironolactone Restarted Lasix 20 mg daily and spironolactone 25 mg daily to help with cirrhosis related ascites Counseled for compliance with the medications Advised DASH diet and moderate exercise/walking, at least 150 mins/week

## 2022-09-28 NOTE — Assessment & Plan Note (Signed)
Uncontrolled  with Prozac 40 mg QD  Did not tolerate Zoloft - drowsiness and diarrhea Cymbalta was ineffective Did not like effect of Lexapro, Effexor and Rexulti Added Vraylar as she has persistent uncontrolled depression Referred to [facility] therapy

## 2022-09-28 NOTE — Assessment & Plan Note (Signed)
Had recent paracentesis - 3 liters of clear fluid removed Has recurrence of ascites, discussed about portacaval shunt versus repeated paracentesis with GI  Recent EGD showed portal hypertensive gastropathy and esophageal varices On B-blocker for ppx On Lasix 20 mg QD and spironolactone 25 mg QD for now

## 2022-09-28 NOTE — Progress Notes (Signed)
Established Patient Office Visit  Subjective:  Patient ID: [name] [name], female    DOB: Nov 18, 1951  Age: 71 y.o. MRN: [id number]  CC:  Chief Complaint  Patient presents with   Follow-up    3 week follow up HTN and ascites pt is depressed     HPI [name] [name] is a 71 y.o. female with past medical history of NASH related liver cirrhosis, portal hypertensive gastropathy, esophageal varices, HTN, AS, DM, HLD, MGUS, iron deficiency anemia, depression and anxiety who presents for f/u of her chronic medical conditions.  HTN: Her BP is well controlled today.  Her medication regimen was recently adjusted, and was placed on Lasix and spironolactone considering her history of liver cirrhosis and ascites.  She has been tolerating the current regimen well.  She denies any headache, dizziness, chest pain, dyspnea or palpitations.  She has chronic abdominal distention and has had a recurrent ascites.  She is followed by GI and is being considered for portacaval shunt.  MDD: She is currently taking Prozac 40 mg daily for it.  She still complains of anhedonia, apathy, lack of interest in routine activities and lack of appetite.  She denies insomnia, SI or HI currently.  She has tried Zoloft, Lexapro, Effexor and Rexulti before.  He does not  Past Medical History:  Diagnosis Date   Allergy    Anemia, unspecified    Anxiety    Arthritis    Chronic diarrhea    Cirrhosis (HCC)    Depression    Depression    Elevated cholesterol    Enterocolitis due to Clostridium difficile, not specified as recurrent    Gastro-esophageal reflux disease with esophagitis    Helicobacter pylori gastritis 07/10/2017   Dx EGD -  1) Omeprazole 20 mg 2 times a day x 14 d 2) Pepto Bismol 2 tabs (262 mg each) 4 times a day x 14 d 3) Metronidazole 250 mg 4 times a day x 14 d 4) doxycycline 100 mg 2 times a day x 14 d  After 14 d stop omeprazole also  In 4 weeks after treatment completed do H. Pylori stool antigen - dx  H. Pylori gastritis    Hx of adenomatous colonic polyps 10/05/2015   Hyperlipemia 02/21/2009   NUC STRESS TEST-NORMAL- EF 74%   Hypertension    Iron deficiency anemia    Irritable bowel syndrome    Localized edema    Major depressive disorder, single episode, unspecified    MVP (mitral valve prolapse)    Rectocele    Thrombocytopenia (HCC)    Type 2 diabetes mellitus with hyperglycemia ([place])     Past Surgical History:  Procedure Laterality Date   ABDOMINAL HYSTERECTOMY     BILATERAL SALPINGOOPHORECTOMY     carpel tunnel Bilateral 12/17/2009   COLONOSCOPY     ESOPHAGOGASTRODUODENOSCOPY     IR PARACENTESIS  05/03/2022   TUBAL LIGATION      Family History  Problem Relation Age of Onset   Anxiety disorder Mother    ADD / ADHD Brother    Alcohol abuse Brother        x 3   Drug abuse Brother    Anxiety disorder Maternal Aunt    Anxiety disorder Maternal Grandmother    Bipolar disorder Other    Emphysema Father    Diabetes Sister    Hyperlipidemia Sister    Cirrhosis Sister    Hyperlipidemia Sister    Diabetes Sister    Diabetes Maternal Uncle  Dementia Neg Hx    OCD Neg Hx    Paranoid behavior Neg Hx    Physical abuse Neg Hx    Schizophrenia Neg Hx    Seizures Neg Hx    Sexual abuse Neg Hx    Colon cancer Neg Hx    Colon polyps Neg Hx    Pancreatic cancer Neg Hx    Esophageal cancer Neg Hx    Stomach cancer Neg Hx    Kidney disease Neg Hx    Liver disease Neg Hx    Rectal cancer Neg Hx     Social History   Socioeconomic History   Marital status: Married    Spouse name: Louie Casa   Number of children: 2   Years of education: Not on file   Highest education level: Not on file  Occupational History   Not on file  Tobacco Use   Smoking status: Former    Years: 30.00    Types: Cigarettes    Quit date: 09/03/1997    Years since quitting: 25.0   Smokeless tobacco: Former    Types: Snuff    Quit date: 05/2022   Tobacco comments:    call when ready to quit     06/14/2022 patient stated she quit 5 or so weeks ago  Vaping Use   Vaping Use: Never used  Substance and Sexual Activity   Alcohol use: No    Alcohol/week: 0.0 standard drinks of alcohol   Drug use: No   Sexual activity: Not on file  Other Topics Concern   Not on file  Social History Narrative   Married 2 sons 1 lives in [place] 1 lives in [place]   She is a housewife   No alcohol tobacco or drug use at this time   Former smoker quit in [place] Determinants of Health   Financial Resource Strain: Low Risk  (08/19/2021)   Overall Financial Resource Strain (CARDIA)    Difficulty of Paying Living Expenses: Not hard at all  Food Insecurity: No Food Insecurity (08/19/2021)   Hunger Vital Sign    Worried About Running Out of Food in the Last Year: Never true    Friendship in the Last Year: Never true  Transportation Needs: No Transportation Needs (08/19/2021)   PRAPARE - Hydrologist (Medical): No    Lack of Transportation (Non-Medical): No  Physical Activity: Sufficiently Active (08/19/2021)   Exercise Vital Sign    Days of Exercise per Week: 5 days    Minutes of Exercise per Session: 30 min  Stress: No Stress Concern Present (08/19/2021)   [place]    Feeling of Stress : Not at all  Social Connections: Moderately Integrated (08/19/2021)   Social Connection and Isolation Panel [NHANES]    Frequency of Communication with Friends and Family: More than three times a week    Frequency of Social Gatherings with Friends and Family: More than three times a week    Attends Religious Services: More than 4 times per year    Active Member of [employer] or Organizations: No    Attends Archivist Meetings: Never    Marital Status: Married  Human resources officer Violence: Not At Risk (08/19/2021)   Humiliation, Afraid, Rape, and Kick questionnaire    Fear of Current or Ex-Partner: No     Emotionally Abused: No    Physically Abused: No    Sexually  Abused: No    Outpatient Medications Prior to Visit  Medication Sig Dispense Refill   BIOTIN PO Take 500 mg by mouth daily.     ferrous sulfate 325 (65 FE) MG EC tablet Take 325 mg by mouth. One daily     FLUoxetine (PROZAC) 40 MG capsule Take 1 capsule (40 mg total) by mouth daily. 90 capsule 1   furosemide (LASIX) 40 MG tablet Take 1 tablet (40 mg total) by mouth daily. 90 tablet 1   glipiZIDE (GLUCOTROL) 10 MG tablet Take 1 tablet (10 mg total) by mouth daily before breakfast. 1 Tablet Daily Before Breakfast 90 tablet 1   Glucagon (GVOKE HYPOPEN 2-PACK) 1 MG/0.2ML SOAJ Inject 0.2 mg into the skin as needed (For low blood glucose < 55). 0.4 mL 11   ondansetron (ZOFRAN) 4 MG tablet Take 1 tablet (4 mg total) by mouth every 8 (eight) hours as needed for nausea or vomiting. 20 tablet 0   rosuvastatin (CRESTOR) 5 MG tablet Take 5 mg by mouth daily. Takes one on Monday, Wed., and Friday     spironolactone (ALDACTONE) 50 MG tablet TAKE 1 TABLET BY MOUTH EVERY DAY 30 tablet 1   triamcinolone cream (KENALOG) 0.1 % APPLY TO AFFECTED AREA TWICE A DAY 30 g 0   carvedilol (COREG) 25 MG tablet Take 1 tablet (25 mg total) by mouth 2 (two) times daily. 180 tablet 3   hydrALAZINE (APRESOLINE) 25 MG tablet Take 1 tablet (25 mg total) by mouth 3 (three) times daily as needed (For Systolic over 629). (Patient not taking: Reported on 06/14/2022) 90 tablet 0   Facility-Administered Medications Prior to Visit  Medication Dose Route Frequency Provider Last Rate Last Admin   0.9 %  sodium chloride infusion  500 mL Intravenous Once Gatha Mayer, MD        Allergies  Allergen Reactions   Codeine Other (See Comments)    Took it years ago and doesn't remember the reaction   Spironolactone Other (See Comments)    Reports intolerance , states unable to take the medication    ROS Review of Systems  Constitutional:  Positive for fatigue. Negative  for chills and fever.  HENT:  Negative for congestion, sinus pressure, sinus pain and sore throat.   Eyes:  Negative for pain and discharge.  Respiratory:  Negative for cough and shortness of breath.   Cardiovascular:  Negative for chest pain and palpitations.  Gastrointestinal:  Positive for abdominal distention. Negative for diarrhea, nausea and vomiting.  Endocrine: Negative for polydipsia and polyuria.  Genitourinary:  Negative for dysuria and hematuria.  Musculoskeletal:  Negative for neck pain and neck stiffness.  Skin:  Negative for rash.  Neurological:  Positive for dizziness. Negative for weakness.  Psychiatric/Behavioral:  Positive for sleep disturbance. Negative for agitation and behavioral problems. The patient is nervous/anxious.       Objective:    Physical Exam Vitals reviewed.  Constitutional:      General: She is not in acute distress.    Appearance: She is not diaphoretic.  HENT:     Head: Normocephalic and atraumatic.     Nose: Nose normal.     Mouth/Throat:     Mouth: Mucous membranes are moist.  Eyes:     General: No scleral icterus.    Extraocular Movements: Extraocular movements intact.  Cardiovascular:     Rate and Rhythm: Normal rate and regular rhythm.     Pulses: Normal pulses.     Heart sounds:  Murmur (Systolic over left upper sternal border) heard.  Pulmonary:     Breath sounds: Normal breath sounds. No wheezing or rales.  Abdominal:     General: There is distension.     Palpations: Abdomen is soft.     Hernia: A hernia (Umbilical) is present.  Musculoskeletal:     Cervical back: Neck supple. No tenderness.     Right lower leg: No edema.     Left lower leg: No edema.  Skin:    General: Skin is warm.     Findings: No rash.  Neurological:     General: No focal deficit present.     Mental Status: She is alert and oriented to person, place, and time.  Psychiatric:        Mood and Affect: Mood normal. Mood is not depressed.        Behavior:  Behavior normal.     BP 124/60 (BP Location: Right Arm, Patient Position: Sitting, Cuff Size: Normal)   Pulse 64   Resp 18   Ht $R'5\' 3"'xx$  (1.6 m)   Wt 117 lb (53.1 kg)   SpO2 97%   BMI 20.73 kg/m  Wt Readings from Last 3 Encounters:  09/28/22 117 lb (53.1 kg)  09/07/22 126 lb 6.4 oz (57.3 kg)  08/09/22 124 lb 14.4 oz (56.7 kg)    Lab Results  Component Value Date   TSH 2.13 02/09/2020   Lab Results  Component Value Date   WBC 6.5 04/25/2022   HGB 10.8 (L) 04/25/2022   HCT 32.9 (L) 04/25/2022   MCV 81.3 04/25/2022   PLT 152.0 04/25/2022   Lab Results  Component Value Date   NA 140 09/24/2022   K 4.6 09/24/2022   CO2 22 09/24/2022   GLUCOSE 172 (H) 09/24/2022   BUN 20 09/24/2022   CREATININE 1.03 (H) 09/24/2022   BILITOT 0.6 09/24/2022   ALKPHOS 100 09/24/2022   AST 31 09/24/2022   ALT 14 09/24/2022   PROT 7.6 09/24/2022   ALBUMIN 3.6 (L) 09/24/2022   CALCIUM 9.1 09/24/2022   ANIONGAP 7 11/29/2021   EGFR 59 (L) 09/24/2022   GFR 48.42 (L) 04/25/2022   Lab Results  Component Value Date   CHOL 214 (H) 09/05/2021   Lab Results  Component Value Date   HDL 54 09/05/2021   Lab Results  Component Value Date   LDLCALC 143 (H) 09/05/2021   Lab Results  Component Value Date   TRIG 93 09/05/2021   Lab Results  Component Value Date   CHOLHDL 4.0 09/05/2021   Lab Results  Component Value Date   HGBA1C 5.6 09/24/2022      Assessment & Plan:   Problem List Items Addressed This Visit       Cardiovascular and Mediastinum   Essential hypertension, benign    BP Readings from Last 1 Encounters:  09/28/22 124/60  Well controlled with Coreg, Lasix and spironolactone Restarted Lasix 20 mg daily and spironolactone 25 mg daily to help with cirrhosis related ascites Counseled for compliance with the medications Advised DASH diet and moderate exercise/walking, at least 150 mins/week        Digestive   Cirrhosis of liver with ascites ([place]) - Primary    Had  recent paracentesis - 3 liters of clear fluid removed Has recurrence of ascites, discussed about portacaval shunt versus repeated paracentesis with GI  Recent EGD showed portal hypertensive gastropathy and esophageal varices On B-blocker for ppx On Lasix 20 mg QD and spironolactone 25  mg QD for now        Other   Major depressive disorder, recurrent episode, moderate (HCC)    Uncontrolled  with Prozac 40 mg QD  Did not tolerate Zoloft - drowsiness and diarrhea Cymbalta was ineffective Did not like effect of Lexapro, Effexor and Rexulti Added Vraylar as she has persistent uncontrolled depression Referred to [facility] therapy      Relevant Medications   cariprazine (VRAYLAR) 1.5 MG capsule   Other Visit Diagnoses     Need for immunization against influenza       Relevant Orders   Flu Vaccine QUAD High Dose(Fluad) (Completed)   Need for pneumococcal 20-valent conjugate vaccination       Relevant Orders   Pneumococcal conjugate vaccine 20-valent (Prevnar 20) (Completed)       Meds ordered this encounter  Medications   cariprazine (VRAYLAR) 1.5 MG capsule    Sig: Take 1 capsule (1.5 mg total) by mouth daily.    Dispense:  30 capsule    Refill:  0    Follow-up: Return in about 3 months (around 12/29/2022) for HTN and MDD.    Lindell Spar, MD

## 2022-09-28 NOTE — Patient Instructions (Signed)
Please start taking Vraylar as prescribed.  Please continue taking other medications as prescribed.  Please continue to follow low carb diet and ambulate as tolerated.

## 2022-10-03 ENCOUNTER — Ambulatory Visit (HOSPITAL_COMMUNITY)
Admission: RE | Admit: 2022-10-03 | Discharge: 2022-10-03 | Disposition: A | Discharge status: Home / Self Care | Payer: Medicare HMO | Source: Ambulatory Visit | Attending: Internal Medicine | Admitting: Internal Medicine

## 2022-10-03 DIAGNOSIS — Z1231 Encounter for screening mammogram for malignant neoplasm of breast: Secondary | ICD-10-CM | POA: Diagnosis not present

## 2022-10-03 DIAGNOSIS — Z780 Asymptomatic menopausal state: Secondary | ICD-10-CM | POA: Insufficient documentation

## 2022-10-03 DIAGNOSIS — M8589 Other specified disorders of bone density and structure, multiple sites: Secondary | ICD-10-CM | POA: Diagnosis not present

## 2022-10-04 ENCOUNTER — Other Ambulatory Visit (HOSPITAL_COMMUNITY): Payer: Self-pay | Admitting: Internal Medicine

## 2022-10-04 DIAGNOSIS — R928 Other abnormal and inconclusive findings on diagnostic imaging of breast: Secondary | ICD-10-CM

## 2022-10-05 LAB — ACID FAST CULTURE WITH REFLEXED SENSITIVITIES (MYCOBACTERIA): Acid Fast Culture: NEGATIVE

## 2022-10-12 ENCOUNTER — Other Ambulatory Visit: Payer: Self-pay | Admitting: *Deleted

## 2022-10-12 ENCOUNTER — Telehealth: Payer: Self-pay | Admitting: Internal Medicine

## 2022-10-12 ENCOUNTER — Telehealth: Payer: Self-pay

## 2022-10-12 DIAGNOSIS — F331 Major depressive disorder, recurrent, moderate: Secondary | ICD-10-CM

## 2022-10-12 MED ORDER — CARIPRAZINE HCL 1.5 MG PO CAPS: 1.5000 mg | ORAL_CAPSULE | Freq: Every day | ORAL | 0 refills | Status: DC

## 2022-10-12 NOTE — Telephone Encounter (Signed)
Medication resent please let her know this is at [facility] and she will need to ask them to start prior authorization on the medication

## 2022-10-12 NOTE — Telephone Encounter (Signed)
Pt notified that vraylar was sent 09/28/22 it was at [facility] she could call the pharmacy and see if they have prescription if not let us know

## 2022-10-12 NOTE — Telephone Encounter (Signed)
Patient requesting a call back.   Has not heard anything in regard antidepressant that was being discussed.

## 2022-10-12 NOTE — Telephone Encounter (Signed)
Patient called said she contacted the pharmacy and have not heard back from medicare or provider about medicine  Vraylar . Please return call to patient.

## 2022-10-16 ENCOUNTER — Ambulatory Visit (HOSPITAL_COMMUNITY)
Admission: RE | Admit: 2022-10-16 | Discharge: 2022-10-16 | Disposition: A | Discharge status: Home / Self Care | Payer: Medicare HMO | Source: Ambulatory Visit | Attending: Internal Medicine | Admitting: Internal Medicine

## 2022-10-16 DIAGNOSIS — N6489 Other specified disorders of breast: Secondary | ICD-10-CM | POA: Diagnosis not present

## 2022-10-16 DIAGNOSIS — R928 Other abnormal and inconclusive findings on diagnostic imaging of breast: Secondary | ICD-10-CM | POA: Diagnosis not present

## 2022-10-16 DIAGNOSIS — R92312 Mammographic fatty tissue density, left breast: Secondary | ICD-10-CM | POA: Diagnosis not present

## 2022-10-16 DIAGNOSIS — R92322 Mammographic fibroglandular density, left breast: Secondary | ICD-10-CM | POA: Diagnosis not present

## 2022-10-17 ENCOUNTER — Telehealth: Payer: Self-pay

## 2022-10-17 ENCOUNTER — Other Ambulatory Visit: Payer: Self-pay | Admitting: Internal Medicine

## 2022-10-17 DIAGNOSIS — F331 Major depressive disorder, recurrent, moderate: Secondary | ICD-10-CM

## 2022-10-17 MED ORDER — QUETIAPINE FUMARATE 25 MG PO TABS: 25.0000 mg | ORAL_TABLET | Freq: Every day | ORAL | 2 refills | Status: DC

## 2022-10-17 NOTE — Telephone Encounter (Signed)
Patient called asked to get message to nurse [facility].  Medicine cariprazine (VRAYLAR) 1.5 MG capsule was sent in and her [facility] insurance will not cover and patient can't afford it $500.00. Patient asked can a generic or something else be sent in   Pharmacy  [id number] [place] [place] [id number] Phone:  [phone] Fax:  [phone] DEA #:  [id number] D[place]urubuscoReason: --   Please contact patient at [phone]

## 2022-10-18 NOTE — Telephone Encounter (Signed)
Pt advised with verbal understanding  °

## 2022-10-24 ENCOUNTER — Telehealth: Payer: Self-pay | Admitting: Cardiovascular Disease

## 2022-10-24 ENCOUNTER — Other Ambulatory Visit: Payer: Self-pay | Admitting: Internal Medicine

## 2022-10-24 DIAGNOSIS — F331 Major depressive disorder, recurrent, moderate: Secondary | ICD-10-CM

## 2022-10-24 NOTE — Telephone Encounter (Signed)
Patient requesting to switch from Dr. Sallyanne Kuster to Dr. Dellia Cloud to be seen in [place].

## 2022-10-25 NOTE — Telephone Encounter (Signed)
No objections

## 2022-10-29 ENCOUNTER — Ambulatory Visit (INDEPENDENT_AMBULATORY_CARE_PROVIDER_SITE_OTHER): Payer: Medicare HMO | Admitting: Gastroenterology

## 2022-10-29 ENCOUNTER — Encounter (INDEPENDENT_AMBULATORY_CARE_PROVIDER_SITE_OTHER): Payer: Self-pay | Admitting: Gastroenterology

## 2022-10-29 ENCOUNTER — Encounter (INDEPENDENT_AMBULATORY_CARE_PROVIDER_SITE_OTHER): Payer: Self-pay | Admitting: *Deleted

## 2022-10-29 ENCOUNTER — Other Ambulatory Visit: Payer: Self-pay | Admitting: Gastroenterology

## 2022-10-29 VITALS — BP 132/58 | HR 52 | Temp 98.5°F | Ht 63.0 in | Wt 119.4 lb

## 2022-10-29 DIAGNOSIS — R188 Other ascites: Secondary | ICD-10-CM | POA: Diagnosis not present

## 2022-10-29 DIAGNOSIS — K7581 Nonalcoholic steatohepatitis (NASH): Secondary | ICD-10-CM

## 2022-10-29 DIAGNOSIS — K7460 Unspecified cirrhosis of liver: Secondary | ICD-10-CM

## 2022-10-29 NOTE — Patient Instructions (Signed)
Please continue coreg '25mg'$  twice daily, lasix '20mg'$  daily and spironolactone '25mg'$  daily for now We will send referral for evaluation for possible TIPS procedure as we discussed  We will update labs and US of the liver - Reduce salt intake to <2 g per day - Can take Tylenol max of 2 g per day (650 mg q8h) for pain - Avoid NSAIDs for pain - Avoid eating raw oysters/shellfish - Ensure every night before going to sleep  Follow up 6 months
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
## 2022-10-29 NOTE — Addendum Note (Signed)
Addended by: Harvel Quale on: 10/29/2022 01:19 PM   Modules accepted: Level of Service

## 2022-10-30 ENCOUNTER — Ambulatory Visit (INDEPENDENT_AMBULATORY_CARE_PROVIDER_SITE_OTHER): Payer: Medicare HMO | Admitting: Gastroenterology

## 2022-10-30 ENCOUNTER — Ambulatory Visit (INDEPENDENT_AMBULATORY_CARE_PROVIDER_SITE_OTHER): Payer: Medicare HMO

## 2022-10-30 DIAGNOSIS — Z0000 Encounter for general adult medical examination without abnormal findings: Secondary | ICD-10-CM

## 2022-10-30 LAB — PROTIME-INR
INR: 1.1 (ref 0.9–1.2)
Prothrombin Time: 11.9 s (ref 9.1–12.0)

## 2022-10-30 LAB — CBC
Hematocrit: 30.1 % — ABNORMAL LOW (ref 34.0–46.6)
Hemoglobin: 9.4 g/dL — ABNORMAL LOW (ref 11.1–15.9)
MCH: 25.3 pg — ABNORMAL LOW (ref 26.6–33.0)
MCHC: 31.2 g/dL — ABNORMAL LOW (ref 31.5–35.7)
MCV: 81 fL (ref 79–97)
Platelets: 156 10*3/uL (ref 150–450)
RBC: 3.72 x10E6/uL — ABNORMAL LOW (ref 3.77–5.28)
RDW: 16.7 % — ABNORMAL HIGH (ref 11.7–15.4)
WBC: 4.9 10*3/uL (ref 3.4–10.8)

## 2022-10-30 LAB — COMPREHENSIVE METABOLIC PANEL
ALT: 11 IU/L (ref 0–32)
AST: 26 IU/L (ref 0–40)
Albumin/Globulin Ratio: 0.8 — ABNORMAL LOW (ref 1.2–2.2)
Albumin: 3.7 g/dL — ABNORMAL LOW (ref 3.9–4.9)
Alkaline Phosphatase: 79 IU/L (ref 44–121)
BUN/Creatinine Ratio: 25 (ref 12–28)
BUN: 27 mg/dL (ref 8–27)
Bilirubin Total: 0.5 mg/dL (ref 0.0–1.2)
CO2: 21 mmol/L (ref 20–29)
Calcium: 9.3 mg/dL (ref 8.7–10.3)
Chloride: 103 mmol/L (ref 96–106)
Creatinine, Ser: 1.07 mg/dL — ABNORMAL HIGH (ref 0.57–1.00)
Globulin, Total: 4.4 g/dL (ref 1.5–4.5)
Glucose: 177 mg/dL — ABNORMAL HIGH (ref 70–99)
Potassium: 5.1 mmol/L (ref 3.5–5.2)
Sodium: 138 mmol/L (ref 134–144)
Total Protein: 8.1 g/dL (ref 6.0–8.5)
eGFR: 56 mL/min/{1.73_m2} — ABNORMAL LOW (ref >59)

## 2022-10-30 LAB — AFP TUMOR MARKER: AFP, Serum, Tumor Marker: 2 ng/mL (ref 0.0–9.2)

## 2022-10-30 NOTE — Patient Instructions (Signed)
  Ms. Borello , Thank you for taking time to come for your Medicare Wellness Visit. I appreciate your ongoing commitment to your health goals. Please review the following plan we discussed and let me know if I can assist you in the future.   These are the goals we discussed:  Goals      Patient Stated     Just wants to feel better     Patient Stated     Patient states that her goal is to feel better and do what she wants to do.        This is a list of the screening recommended for you and due dates:  Health Maintenance  Topic Date Due   Eye exam for diabetics  Never done   Tetanus Vaccine  Never done   Zoster (Shingles) Vaccine (1 of 2) Never done   COVID-19 Vaccine (3 - Moderna risk series) 04/22/2020   Yearly kidney health urinalysis for diabetes  03/13/2022   Hemoglobin A1C  03/26/2023   Complete foot exam   09/08/2023   Yearly kidney function blood test for diabetes  10/30/2023   Medicare Annual Wellness Visit  10/31/2023   Colon Cancer Screening  03/03/2024   Mammogram  10/03/2024   Pneumonia Vaccine  Completed   Flu Shot  Completed   DEXA scan (bone density measurement)  Completed   Hepatitis C Screening: USPSTF Recommendation to screen - Ages 69-79 yo.  Completed   HPV Vaccine  Aged Out

## 2022-10-30 NOTE — Progress Notes (Signed)
Subjective:   [name] [name] is a 71 y.o. female who presents for Medicare Annual (Subsequent) preventive examination. I connected with  [name] [name] on 10/30/22 by a audio enabled telemedicine application and verified that I am speaking with the correct person using two identifiers.  Patient Location: Home  Provider Location: Office/Clinic  I discussed the limitations of evaluation and management by telemedicine. The patient expressed understanding and agreed to proceed.  Review of Systems           Objective:    There were no vitals filed for this visit. There is no height or weight on file to calculate BMI.     11/29/2021    4:19 PM 08/19/2021    8:13 AM 03/23/2020    2:40 PM 03/01/2020   10:49 AM 10/01/2017   10:52 AM 06/26/2017    4:31 PM  Advanced Directives  Does Patient Have a Medical Advance Directive? No No No No No No  Would patient like information on creating a medical advance directive?  No - Patient declined No - Patient declined No - Patient declined      Current Medications (verified) Outpatient Encounter Medications as of 10/30/2022  Medication Sig   BIOTIN PO Take 500 mg by mouth daily.   carvedilol (COREG) 25 MG tablet Take 1 tablet (25 mg total) by mouth 2 (two) times daily.   ferrous sulfate 325 (65 FE) MG EC tablet Take 325 mg by mouth. One daily   FLUoxetine (PROZAC) 40 MG capsule Take 1 capsule (40 mg total) by mouth daily.   furosemide (LASIX) 20 MG tablet Take 20 mg by mouth daily.   glipiZIDE (GLUCOTROL) 10 MG tablet Take 1 tablet (10 mg total) by mouth daily before breakfast. 1 Tablet Daily Before Breakfast   hydrALAZINE (APRESOLINE) 25 MG tablet Take 1 tablet (25 mg total) by mouth 3 (three) times daily as needed (For Systolic over 426).   ondansetron (ZOFRAN) 4 MG tablet Take 1 tablet (4 mg total) by mouth every 8 (eight) hours as needed for nausea or vomiting.   QUEtiapine (SEROQUEL) 25 MG tablet Take 1 tablet (25 mg total) by mouth at  bedtime.   rosuvastatin (CRESTOR) 5 MG tablet Take 5 mg by mouth daily. Takes one on Monday, Wed., and Friday   spironolactone (ALDACTONE) 50 MG tablet TAKE 1 TABLET BY MOUTH EVERY DAY   triamcinolone cream (KENALOG) 0.1 % APPLY TO AFFECTED AREA TWICE A DAY   Facility-Administered Encounter Medications as of 10/30/2022  Medication   0.9 %  sodium chloride infusion    Allergies (verified) Codeine and Spironolactone   History: Past Medical History:  Diagnosis Date   Allergy    Anemia, unspecified    Anxiety    Arthritis    Chronic diarrhea    Cirrhosis (HCC)    Depression    Depression    Elevated cholesterol    Enterocolitis due to Clostridium difficile, not specified as recurrent    Gastro-esophageal reflux disease with esophagitis    Helicobacter pylori gastritis 07/10/2017   Dx EGD -  1) Omeprazole 20 mg 2 times a day x 14 d 2) Pepto Bismol 2 tabs (262 mg each) 4 times a day x 14 d 3) Metronidazole 250 mg 4 times a day x 14 d 4) doxycycline 100 mg 2 times a day x 14 d  After 14 d stop omeprazole also  In 4 weeks after treatment completed do H. Pylori stool antigen - dx H. Pylori  gastritis    Hx of adenomatous colonic polyps 10/05/2015   Hyperlipemia 02/21/2009   NUC STRESS TEST-NORMAL- EF 74%   Hypertension    Iron deficiency anemia    Irritable bowel syndrome    Localized edema    Major depressive disorder, single episode, unspecified    MVP (mitral valve prolapse)    Rectocele    Thrombocytopenia (HCC)    Type 2 diabetes mellitus with hyperglycemia (HCC)    Past Surgical History:  Procedure Laterality Date   ABDOMINAL HYSTERECTOMY     BILATERAL SALPINGOOPHORECTOMY     carpel tunnel Bilateral 12/17/2009   COLONOSCOPY     ESOPHAGOGASTRODUODENOSCOPY     IR PARACENTESIS  05/03/2022   TUBAL LIGATION     Family History  Problem Relation Age of Onset   Anxiety disorder Mother    ADD / ADHD Brother    Alcohol abuse Brother        x 3   Drug abuse Brother    Anxiety  disorder Maternal Aunt    Anxiety disorder Maternal Grandmother    Bipolar disorder Other    Emphysema Father    Diabetes Sister    Hyperlipidemia Sister    Cirrhosis Sister    Hyperlipidemia Sister    Diabetes Sister    Diabetes Maternal Uncle    Dementia Neg Hx    OCD Neg Hx    Paranoid behavior Neg Hx    Physical abuse Neg Hx    Schizophrenia Neg Hx    Seizures Neg Hx    Sexual abuse Neg Hx    Colon cancer Neg Hx    Colon polyps Neg Hx    Pancreatic cancer Neg Hx    Esophageal cancer Neg Hx    Stomach cancer Neg Hx    Kidney disease Neg Hx    Liver disease Neg Hx    Rectal cancer Neg Hx    Social History   Socioeconomic History   Marital status: Married    Spouse name: [name] [name]   Number of children: 2   Years of education: Not on file   Highest education level: Not on file  Occupational History   Not on file  Tobacco Use   Smoking status: Former    Years: 30.00    Types: Cigarettes    Quit date: 09/03/1997    Years since quitting: 25.1   Smokeless tobacco: Former    Types: Snuff    Quit date: 05/2022   Tobacco comments:    call when ready to quit    06/14/2022 patient stated she quit 5 or so weeks ago  Vaping Use   Vaping Use: Never used  Substance and Sexual Activity   Alcohol use: No    Alcohol/week: 0.0 standard drinks of alcohol   Drug use: No   Sexual activity: Not on file  Other Topics Concern   Not on file  Social History Narrative   Married 2 sons 1 lives in [place] 1 lives in [place]   She is a housewife   No alcohol tobacco or drug use at this time   Former smoker quit in [place] Determinants of Health   Financial Resource Strain: Low Risk  (08/19/2021)   Overall Financial Resource Strain (CARDIA)    Difficulty of Paying Living Expenses: Not hard at all  Food Insecurity: No Food Insecurity (08/19/2021)   Hunger Vital Sign    Worried About Running Out of Food in the Last Year: Never true  Ran Out of Food in the Last Year:  Never true  Transportation Needs: No Transportation Needs (08/19/2021)   PRAPARE - Hydrologist (Medical): No    Lack of Transportation (Non-Medical): No  Physical Activity: Sufficiently Active (08/19/2021)   Exercise Vital Sign    Days of Exercise per Week: 5 days    Minutes of Exercise per Session: 30 min  Stress: No Stress Concern Present (08/19/2021)   Accident    Feeling of Stress : Not at all  Social Connections: Moderately Integrated (08/19/2021)   Social Connection and Isolation Panel [NHANES]    Frequency of Communication with Friends and Family: More than three times a week    Frequency of Social Gatherings with Friends and Family: More than three times a week    Attends Religious Services: More than 4 times per year    Active Member of [employer] or Organizations: No    Attends Archivist Meetings: Never    Marital Status: Married    Tobacco Counseling Counseling given: Not Answered Tobacco comments: call when ready to quit 06/14/2022 patient stated she quit 5 or so weeks ago   Clinical Intake:                 Diabetic?yes      Nutrition Risk Assessment:  Has the patient had any N/V/D within the last 2 months?  No  Does the patient have any non-healing wounds?  No  Has the patient had any unintentional weight loss or weight gain?  No   Diabetes:  Is the patient diabetic?  Yes  If diabetic, was a CBG obtained today?  No  Did the patient bring in their glucometer from home?  No  How often do you monitor your CBG's? 0.   Financial Strains and Diabetes Management:  Are you having any financial strains with the device, your supplies or your medication? No .  Does the patient want to be seen by Chronic Care Management for management of their diabetes?  No  Would the patient like to be referred to a Nutritionist or for Diabetic Management?  No   Diabetic  Exams:  Diabetic Eye Exam: Overdue for diabetic eye exam. Pt has been advised about the importance in completing this exam. Patient advised to call and schedule an eye exam. Diabetic Foot Exam: Completed 09/07/22     Activities of Daily Living     No data to display          Patient Care Team: Lindell Spar, MD as PCP - General (Internal Medicine) Croitoru, Dani Gobble, MD as PCP - Cardiology (Cardiology)  Indicate any recent Medical Services you may have received from other than [facility] providers in the past year (date may be approximate).     Assessment:   This is a routine wellness examination for Dashayla.  Hearing/Vision screen No results found.  Dietary issues and exercise activities discussed:     Goals Addressed   None    Depression Screen    09/28/2022   10:28 AM 09/07/2022   10:26 AM 06/08/2022    9:20 AM 04/04/2022   10:12 AM 03/26/2022    3:15 PM 03/08/2022   10:52 AM 11/21/2021    8:24 AM  PHQ 2/9 Scores  PHQ - 2 Score 3 0 1 0 1 3 0  PHQ- 9 Score 7 0  0 7 15     Fall Risk  09/28/2022   10:28 AM 09/07/2022   10:26 AM 06/08/2022    9:20 AM 04/04/2022   10:12 AM 03/26/2022    3:15 PM  Fall Risk   Falls in the past year? 0 0 0 0 0  Number falls in past yr: 0 0 0 0 0  Injury with Fall? 0 0 0 0 0  Risk for fall due to : No Fall Risks No Fall Risks No Fall Risks No Fall Risks No Fall Risks  Follow up Falls evaluation completed Falls evaluation completed Falls evaluation completed Falls evaluation completed Falls evaluation completed    [place]:  Any stairs in or around the home? Yes  If so, are there any without handrails? No  Home free of loose throw rugs in walkways, pet beds, electrical cords, etc? Yes  Adequate lighting in your home to reduce risk of falls? Yes   ASSISTIVE DEVICES UTILIZED TO PREVENT FALLS:  Life alert? No  Use of a cane, walker or w/c? No  Grab bars in the bathroom? Yes  Shower chair or bench in  shower? Yes  Elevated toilet seat or a handicapped toilet? No   TIMED UP AND GO:  Was the test performed? No .  Length of time to ambulate 10 feet:  sec.     Cognitive Function:    08/19/2021    8:15 AM  MMSE - Mini Mental State Exam  Not completed: Unable to complete        08/19/2021    8:16 AM  6CIT Screen  What Year? 0 points  What month? 0 points  What time? 0 points  Count back from 20 0 points  Months in reverse 0 points  Repeat phrase 2 points  Total Score 2 points    Immunizations Immunization History  Administered Date(s) Administered   Fluad Quad(high Dose 65+) 08/31/2021, 09/28/2022   Influenza,inj,Quad PF,6+ Mos 09/16/2014, 09/25/2017   Influenza-Unspecified 10/10/2011, 10/16/2013, 09/22/2015   Moderna Sars-Covid-2 Vaccination 02/18/2020, 03/25/2020   PNEUMOCOCCAL CONJUGATE-20 09/28/2022   Pneumococcal Polysaccharide-23 02/18/2018    TDAP status: Due, Education has been provided regarding the importance of this vaccine. Advised may receive this vaccine at local pharmacy or Health Dept. Aware to provide a copy of the vaccination record if obtained from local pharmacy or Health Dept. Verbalized acceptance and understanding.  Flu Vaccine status: Up to date  Pneumococcal vaccine status: Up to date  Covid-19 vaccine status: Information provided on how to obtain vaccines.   Qualifies for Shingles Vaccine? Yes   Zostavax completed No   Shingrix Completed?: Yes  Screening Tests Health Maintenance  Topic Date Due   OPHTHALMOLOGY EXAM  Never done   TETANUS/TDAP  Never done   Zoster Vaccines- Shingrix (1 of 2) Never done   COVID-19 Vaccine (3 - Moderna risk series) 04/22/2020   Diabetic kidney evaluation - Urine ACR  03/13/2022   Medicare Annual Wellness (AWV)  08/19/2022   HEMOGLOBIN A1C  03/26/2023   FOOT EXAM  09/08/2023   Diabetic kidney evaluation - GFR measurement  10/30/2023   COLONOSCOPY (Pts 45-33yr Insurance coverage will need to be  confirmed)  03/03/2024   MAMMOGRAM  10/03/2024   Pneumonia Vaccine 71 Years old  Completed   INFLUENZA VACCINE  Completed   DEXA SCAN  Completed   Hepatitis C Screening  Completed   HPV VACCINES  Aged Out    Health Maintenance  Health Maintenance Due  Topic Date Due   OPHTHALMOLOGY EXAM  Never done   TETANUS/TDAP  Never done   Zoster Vaccines- Shingrix (1 of 2) Never done   COVID-19 Vaccine (3 - Moderna risk series) 04/22/2020   Diabetic kidney evaluation - Urine ACR  03/13/2022   Medicare Annual Wellness (AWV)  08/19/2022    Colorectal cancer screening: Type of screening: Colonoscopy. Completed 03/04/19. Repeat every 5 years  Mammogram status: Completed 10/03/22. Repeat every year  Bone Density status: Completed 10/03/22. Results reflect: Bone density results: OSTEOPENIA. Repeat every   years.  Lung Cancer Screening: (Low Dose CT Chest recommended if Age 42-80 years, 30 pack-year currently smoking OR have quit w/in 15years.) does not qualify.   Lung Cancer Screening Referral:   Additional Screening:  Hepatitis C Screening: does not qualify; Completed 02/09/21  Vision Screening: Recommended annual ophthalmology exams for early detection of glaucoma and other disorders of the eye. Is the patient up to date with their annual eye exam?  No  Who is the provider or what is the name of the office in which the patient attends annual eye exams? My eye doctor If pt is not established with a provider, would they like to be referred to a provider to establish care? No .   Dental Screening: Recommended annual dental exams for proper oral hygiene  Community Resource Referral / Chronic Care Management: CRR required this visit?  No   CCM required this visit?  No      Plan:     I have personally reviewed and noted the following in the patient's chart:   Medical and social history Use of alcohol, tobacco or illicit drugs  Current medications and supplements including opioid  prescriptions. Patient is not currently taking opioid prescriptions. Functional ability and status Nutritional status Physical activity Advanced directives List of other physicians Hospitalizations, surgeries, and ER visits in previous 12 months Vitals Screenings to include cognitive, depression, and falls Referrals and appointments  In addition, I have reviewed and discussed with patient certain preventive protocols, quality metrics, and best practice recommendations. A written personalized care plan for preventive services as well as general preventive health recommendations were provided to patient.     Jill Side, [place]   10/30/2022   Nurse Notes:

## 2022-11-06 ENCOUNTER — Ambulatory Visit (HOSPITAL_COMMUNITY)
Admission: RE | Admit: 2022-11-06 | Discharge: 2022-11-06 | Disposition: A | Discharge status: Home / Self Care | Payer: Medicare HMO | Source: Ambulatory Visit | Attending: Gastroenterology | Admitting: Gastroenterology

## 2022-11-06 DIAGNOSIS — R932 Abnormal findings on diagnostic imaging of liver and biliary tract: Secondary | ICD-10-CM | POA: Diagnosis not present

## 2022-11-06 DIAGNOSIS — K8020 Calculus of gallbladder without cholecystitis without obstruction: Secondary | ICD-10-CM | POA: Diagnosis not present

## 2022-11-06 DIAGNOSIS — K7460 Unspecified cirrhosis of liver: Secondary | ICD-10-CM | POA: Diagnosis not present

## 2022-11-06 DIAGNOSIS — R188 Other ascites: Secondary | ICD-10-CM | POA: Insufficient documentation

## 2022-11-10 ENCOUNTER — Other Ambulatory Visit: Payer: Self-pay | Admitting: Internal Medicine

## 2022-11-10 DIAGNOSIS — F331 Major depressive disorder, recurrent, moderate: Secondary | ICD-10-CM

## 2022-11-12 ENCOUNTER — Ambulatory Visit
Admission: RE | Admit: 2022-11-12 | Discharge: 2022-11-12 | Disposition: A | Discharge status: Home / Self Care | Payer: Medicare HMO | Source: Ambulatory Visit | Attending: Gastroenterology | Admitting: Gastroenterology

## 2022-11-12 DIAGNOSIS — K766 Portal hypertension: Secondary | ICD-10-CM | POA: Diagnosis not present

## 2022-11-12 DIAGNOSIS — R188 Other ascites: Secondary | ICD-10-CM | POA: Diagnosis not present

## 2022-11-12 DIAGNOSIS — K7469 Other cirrhosis of liver: Secondary | ICD-10-CM | POA: Diagnosis not present

## 2022-11-12 DIAGNOSIS — K7581 Nonalcoholic steatohepatitis (NASH): Secondary | ICD-10-CM

## 2022-11-12 HISTORY — PX: IR RADIOLOGIST EVAL & MGMT: IMG5224

## 2022-11-12 NOTE — Consult Note (Signed)
Chief Complaint: Patient was seen in virtual telephone consultation today for recurrent ascites  Referring Physician(s): Jenetta Downer Mayorga,Daniel  History of Present Illness: [name] [name] is a 71 y.o. female with history of NASH cirrhosis graciously referred by Dr. Jenetta Downer for evaluation for possible TIPS.  She has followed with Dr. Carlean Purl for many years and was diagnosed with likely NASH cirrhosis in 2022.  In May 2023 she had her first paracentesis, yielding 2.5 L, then again in September 2023 underwent another paracentesis yielding 3 L.  She has not required additional paracentesis.  She is taking furosemide 20 mg QD and spironolactone 50 mg QD.  Has had some lower extremity edema but well controlled.  She denies any encephalopathy or gastrointestinal hemorrhage.  She underwent EGD in April 2022 at which time portal hypertensive gastropathy and grade II esophageal varices were noted.  No intervention required.  Biopsy was done at this time which was significant for H. Pylori which was treated.  No history of renal or heart failure.  Past Medical History:  Diagnosis Date   Allergy    Anemia, unspecified    Anxiety    Arthritis    Chronic diarrhea    Cirrhosis (HCC)    Depression    Depression    Elevated cholesterol    Enterocolitis due to Clostridium difficile, not specified as recurrent    Gastro-esophageal reflux disease with esophagitis    Helicobacter pylori gastritis 07/10/2017   Dx EGD -  1) Omeprazole 20 mg 2 times a day x 14 d 2) Pepto Bismol 2 tabs (262 mg each) 4 times a day x 14 d 3) Metronidazole 250 mg 4 times a day x 14 d 4) doxycycline 100 mg 2 times a day x 14 d  After 14 d stop omeprazole also  In 4 weeks after treatment completed do H. Pylori stool antigen - dx H. Pylori gastritis    Hx of adenomatous colonic polyps 10/05/2015   Hyperlipemia 02/21/2009   NUC STRESS TEST-NORMAL- EF 74%   Hypertension    Iron deficiency anemia    Irritable bowel syndrome     Localized edema    Major depressive disorder, single episode, unspecified    MVP (mitral valve prolapse)    Rectocele    Thrombocytopenia (HCC)    Type 2 diabetes mellitus with hyperglycemia ([place])     Past Surgical History:  Procedure Laterality Date   ABDOMINAL HYSTERECTOMY     BILATERAL SALPINGOOPHORECTOMY     carpel tunnel Bilateral 12/17/2009   COLONOSCOPY     ESOPHAGOGASTRODUODENOSCOPY     IR PARACENTESIS  05/03/2022   TUBAL LIGATION      Allergies: Codeine and Spironolactone  Medications: Prior to Admission medications   Medication Sig Start Date End Date Taking? Authorizing Provider  BIOTIN PO Take 500 mg by mouth daily.    [provider]  carvedilol (COREG) 25 MG tablet Take 1 tablet (25 mg total) by mouth 2 (two) times daily. 02/21/22 10/29/22  Almyra Deforest, PA  ferrous sulfate 325 (65 FE) MG EC tablet Take 325 mg by mouth. One daily    [provider]  FLUoxetine (PROZAC) 40 MG capsule Take 1 capsule (40 mg total) by mouth daily. 06/08/22   Lindell Spar, MD  furosemide (LASIX) 20 MG tablet Take 20 mg by mouth daily.    [provider]  glipiZIDE (GLUCOTROL) 10 MG tablet Take 1 tablet (10 mg total) by mouth daily before breakfast. 1 Tablet Daily Before Breakfast  08/27/22   Lindell Spar, MD  hydrALAZINE (APRESOLINE) 25 MG tablet Take 1 tablet (25 mg total) by mouth 3 (three) times daily as needed (For Systolic over 425). 12/07/21 10/29/22  Croitoru, [name], MD  ondansetron (ZOFRAN) 4 MG tablet Take 1 tablet (4 mg total) by mouth every 8 (eight) hours as needed for nausea or vomiting. 09/07/22   Lindell Spar, MD  QUEtiapine (SEROQUEL) 25 MG tablet TAKE 1 TABLET BY MOUTH EVERYDAY AT BEDTIME 11/12/22   Lindell Spar, MD  rosuvastatin (CRESTOR) 5 MG tablet Take 5 mg by mouth daily. Takes one on Monday, Wed., and Friday    [provider]  spironolactone (ALDACTONE) 50 MG tablet TAKE 1 TABLET BY MOUTH EVERY DAY 09/03/22   Carlan, Chelsea L,  NP  triamcinolone cream (KENALOG) 0.1 % APPLY TO AFFECTED AREA TWICE A DAY 10/16/21   Lindell Spar, MD     Family History  Problem Relation Age of Onset   Anxiety disorder Mother    ADD / ADHD Brother    Alcohol abuse Brother        x 3   Drug abuse Brother    Anxiety disorder Maternal Aunt    Anxiety disorder Maternal Grandmother    Bipolar disorder Other    Emphysema Father    Diabetes Sister    Hyperlipidemia Sister    Cirrhosis Sister    Hyperlipidemia Sister    Diabetes Sister    Diabetes Maternal Uncle    Dementia Neg Hx    OCD Neg Hx    Paranoid behavior Neg Hx    Physical abuse Neg Hx    Schizophrenia Neg Hx    Seizures Neg Hx    Sexual abuse Neg Hx    Colon cancer Neg Hx    Colon polyps Neg Hx    Pancreatic cancer Neg Hx    Esophageal cancer Neg Hx    Stomach cancer Neg Hx    Kidney disease Neg Hx    Liver disease Neg Hx    Rectal cancer Neg Hx     Social History   Socioeconomic History   Marital status: Married    Spouse name: Louie Casa   Number of children: 2   Years of education: Not on file   Highest education level: Not on file  Occupational History   Not on file  Tobacco Use   Smoking status: Former    Years: 30.00    Types: Cigarettes    Quit date: 09/03/1997    Years since quitting: 25.2   Smokeless tobacco: Former    Types: Snuff    Quit date: 05/2022   Tobacco comments:    call when ready to quit    06/14/2022 patient stated she quit 5 or so weeks ago  Vaping Use   Vaping Use: Never used  Substance and Sexual Activity   Alcohol use: No    Alcohol/week: 0.0 standard drinks of alcohol   Drug use: No   Sexual activity: Not on file  Other Topics Concern   Not on file  Social History Narrative   Married 2 sons 1 lives in [place] 1 lives in [place]   She is a housewife   No alcohol tobacco or drug use at this time   Former smoker quit in [place] Determinants of Health   Financial Resource Strain: Low Risk   (10/30/2022)   Overall Financial Resource Strain (CARDIA)    Difficulty of Paying Living  Expenses: Not hard at all  Food Insecurity: No Food Insecurity (10/30/2022)   Hunger Vital Sign    Worried About Running Out of Food in the Last Year: Never true    Ran Out of Food in the Last Year: Never true  Transportation Needs: No Transportation Needs (08/19/2021)   PRAPARE - Hydrologist (Medical): No    Lack of Transportation (Non-Medical): No  Physical Activity: Sufficiently Active (08/19/2021)   Exercise Vital Sign    Days of Exercise per Week: 5 days    Minutes of Exercise per Session: 30 min  Stress: No Stress Concern Present (10/30/2022)   [place]    Feeling of Stress : Not at all  Social Connections: Moderately Integrated (10/30/2022)   Social Connection and Isolation Panel [NHANES]    Frequency of Communication with Friends and Family: More than three times a week    Frequency of Social Gatherings with Friends and Family: Once a week    Attends Religious Services: 1 to 4 times per year    Active Member of [employer] or Organizations: No    Attends Music therapist: Not on file    Marital Status: Married    Review of Systems: A 12 point ROS discussed and pertinent positives are indicated in the HPI above.  All other systems are negative.   Vital Signs: There were no vitals taken for this visit.  No physical examination was performed in lieu of virtual telephone clinic visit.   Imaging: US Liver (11/06/22) Cirrhosis, no hepatoma, patent hepatic vasculature.  Moderate ascites.  Echocardiogram:  06/13/22  1. Left ventricular ejection fraction, by estimation, is 60 to 65%. The  left ventricle has normal function. The left ventricle has no regional  wall motion abnormalities. Left ventricular diastolic parameters are  consistent with Grade I diastolic  dysfunction (impaired  relaxation). Elevated left atrial pressure.   2. Right ventricular systolic function is normal. The right ventricular  size is normal. There is normal pulmonary artery systolic pressure.   3. Left atrial size was mild to moderately dilated.   4. The mitral valve is normal in structure. Trivial mitral valve  regurgitation. No evidence of mitral stenosis.   5. The tricuspid valve is abnormal.   6. The aortic valve is tricuspid. There is mild calcification of the  aortic valve. There is mild thickening of the aortic valve. Aortic valve  regurgitation is moderate. Moderate aortic valve stenosis. Aortic valve  mean gradient measures 25.0 mmHg.  Aortic valve peak gradient measures 44.8 mmHg. Aortic valve area, by VTI  measures 1.11 cm.   7. The inferior vena cava is normal in size with greater than 50%  respiratory variability, suggesting right atrial pressure of 3 mmHg.    Labs:  CBC: Recent Labs    11/29/21 1830 03/08/22 1127 04/25/22 1016 10/29/22 1157  WBC 6.0 5.5 6.5 4.9  HGB 13.3 11.2 10.8* 9.4*  HCT 38.5 34.2 32.9* 30.1*  PLT 74* 117* 152.0 156    COAGS: Recent Labs    04/25/22 1016 10/29/22 1157  INR 1.2* 1.1    BMP: Recent Labs    11/29/21 1830 03/08/22 1127 06/28/22 1109 08/30/22 0757 09/24/22 0818 10/29/22 1157  NA 141   < > 139 140 140 138  K 3.7   < > 4.3 4.7 4.6 5.1  CL 106   < > 104 106 103 103  CO2 28   < >  $'22 27 22 21  'N$ GLUCOSE 148*   < > 118* 84 172* 177*  BUN 15   < > '25 15 20 27  '$ CALCIUM 9.3   < > 8.9 9.2 9.1 9.3  CREATININE 0.75   < > 1.09* 1.01 1.03* 1.07*  GFRNONAA >60  --   --   --   --   --    < > = values in this interval not displayed.    LIVER FUNCTION TESTS: Recent Labs    04/25/22 1016 09/24/22 0818 10/29/22 1157  BILITOT 0.8 0.6 0.5  AST '22 31 26  '$ ALT '9 14 11  '$ ALKPHOS 75 100 79  PROT 7.9 7.6 8.1  ALBUMIN 3.7 3.6* 3.7*    TUMOR MARKERS: Recent Labs    04/25/22 1016  AFPTM 2.9   Child-Pugh = 6 points, class  A MELD = 9 (6.0% estimated 3 month mortality) [place] Index of Post-TIPS Survival (FIPS) = -0.55 (overall survival estimated at 1 month 97.4%, 3 months 91.3%, and 6 months 87.2%)   Assessment and Plan: 71 year old female with history of NASH cirrhosis (Child Pugh A, MELD 9) with mild ascites requiring only 2 paracenteses since May 2023.  She is tolerating low dose diuretics well.    We discussed TIPS indications and the basics of the procedure.    At this time, she has no indication for TIPS creation due to minimal ascites and tolerance of diuretics.  If her ascites were to worsen and up-titration of diuretics is not tolerated, she may then be a great TIPS candidate.  Plan to follow up in 6 months for re-assessment.  She was given our clinic phone number should her ascites worsen, at which point she will contact us.    Electronically Signed: Suzette Battiest, MD 11/12/2022, 8:32 AM 3  I spent a total of  30 Minutes  in virtual telephone clinical consultation, greater than 50% of which was counseling/coordinating care for portal hypertension.

## 2022-11-16 IMAGING — US US ABDOMEN LIMITED
1 series · 6 of 6 positions shown · non-contrast
Comparison: CT abdomen pelvis 11/16/2020

CLINICAL DATA: Evaluate for ascites.

EXAM:
LIMITED ABDOMEN ULTRASOUND FOR ASCITES
TECHNIQUE: Limited ultrasound survey for ascites was performed in all four
abdominal quadrants.

[Series 1: us ascites (abdomen limited) · 6 of 6 slices shown]
[im 1/6]
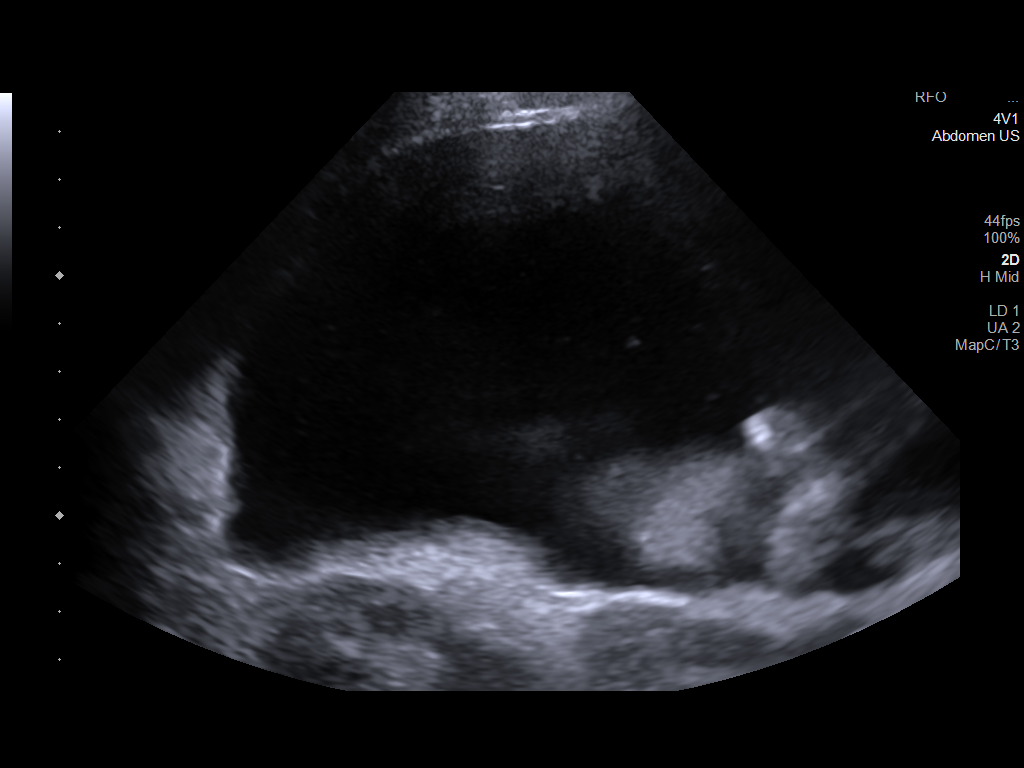
[im 2/6]
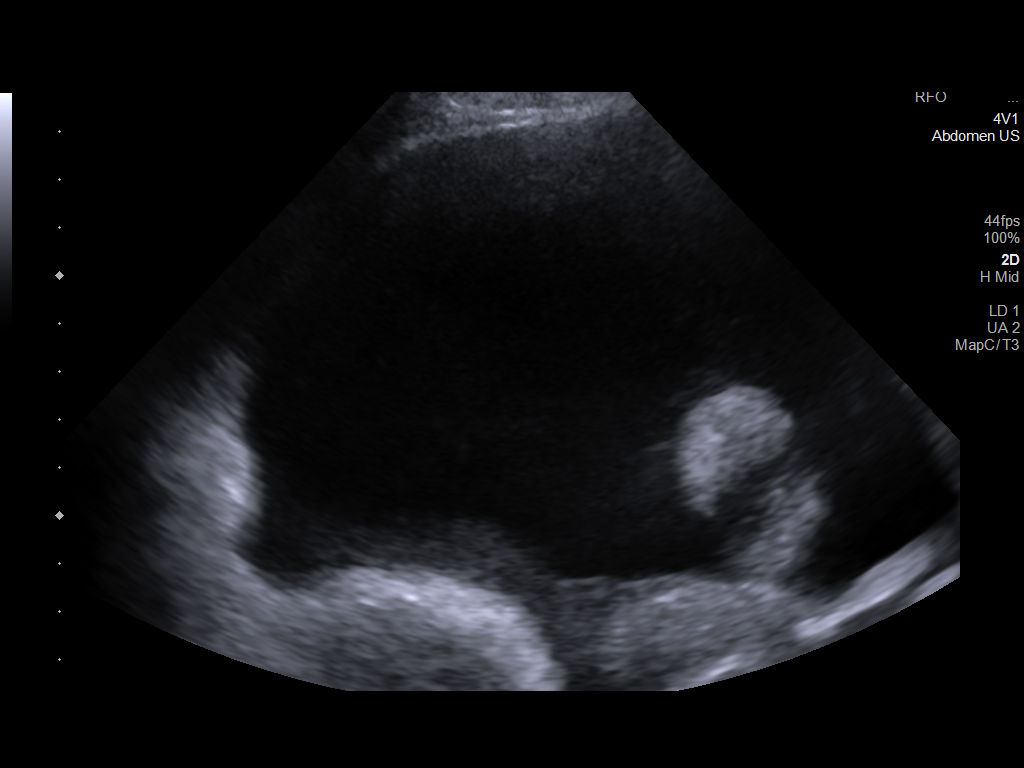
[im 3/6]
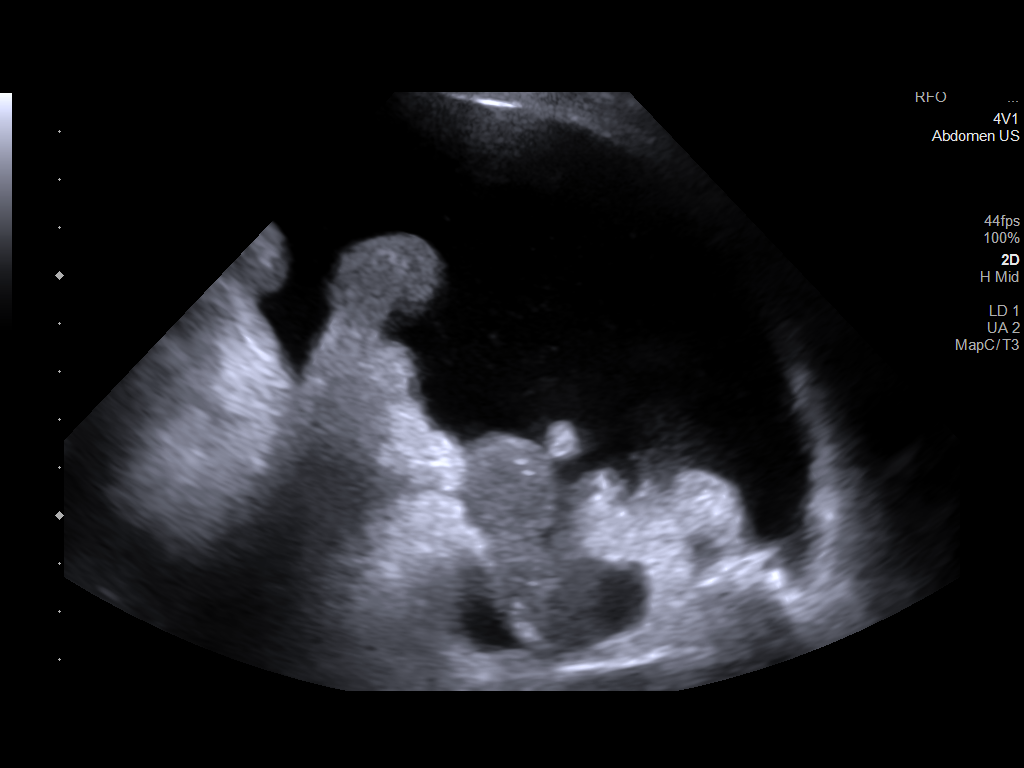
[im 4/6]
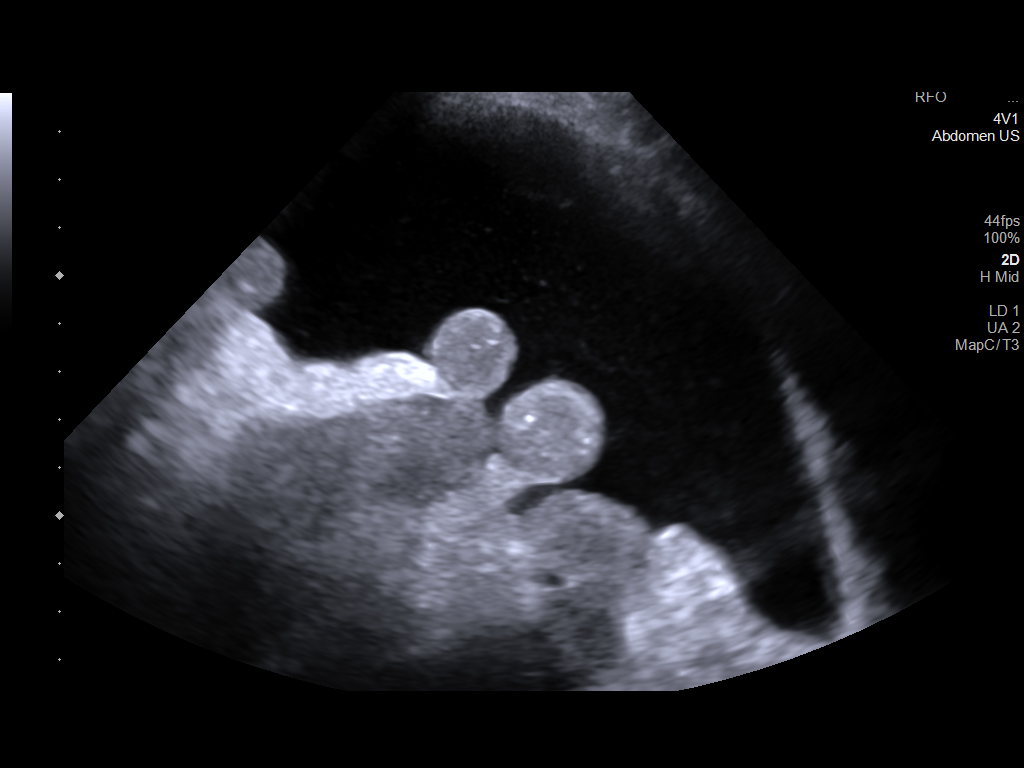
[im 5/6]
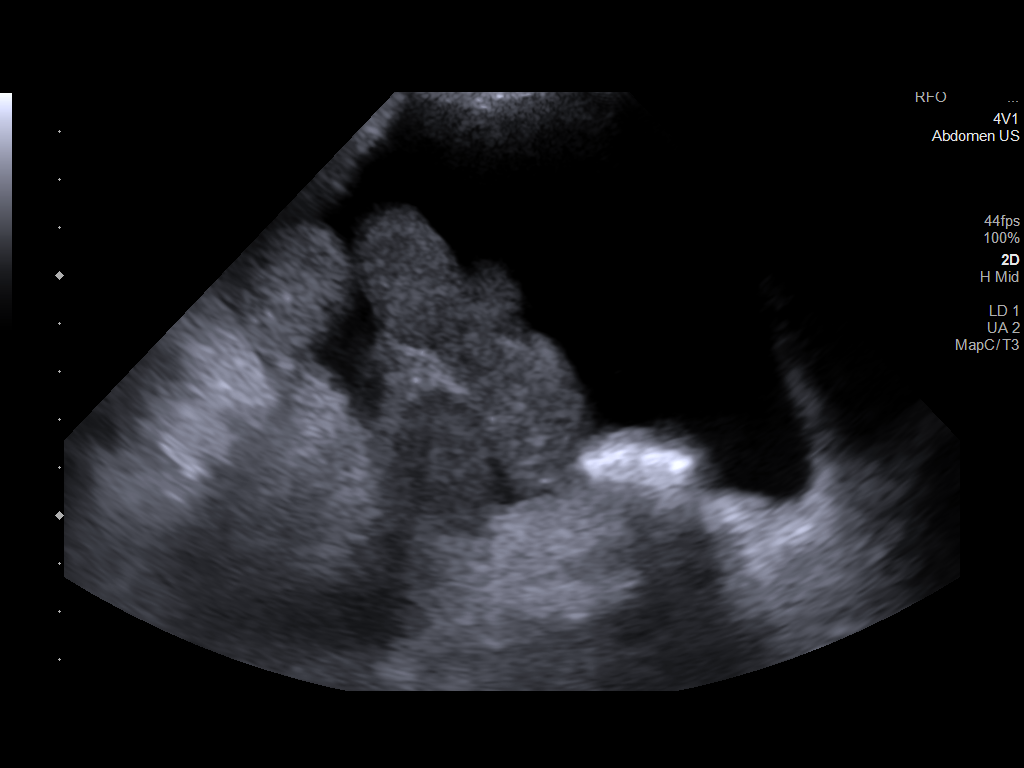
[im 6/6]
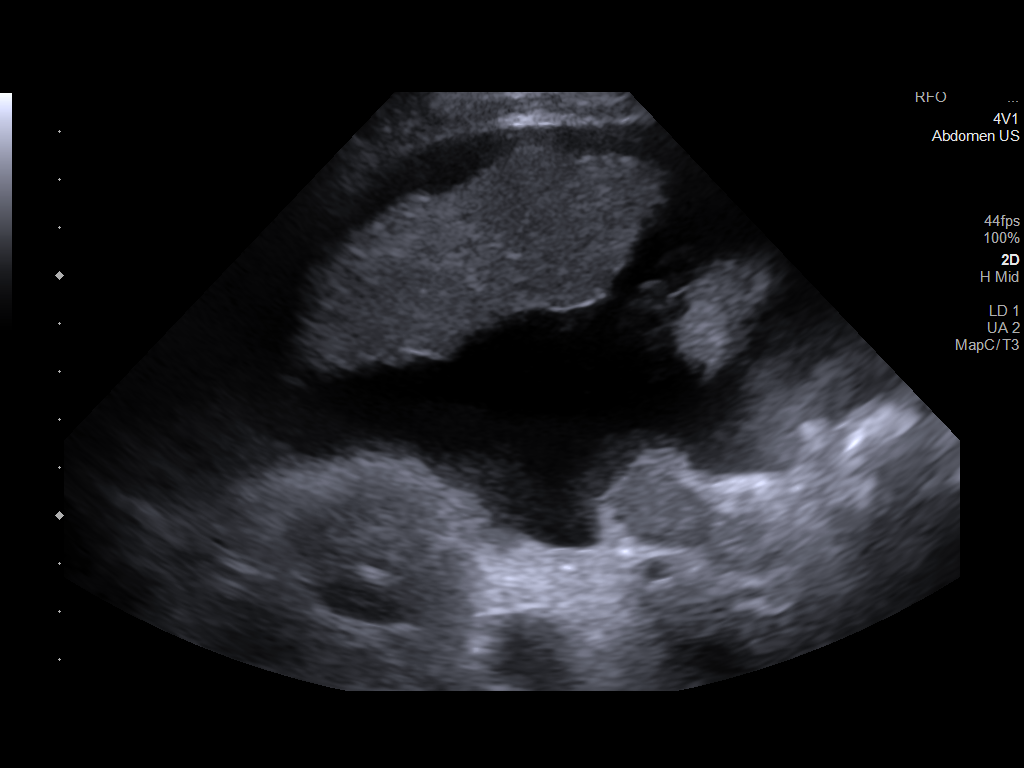

[6 of 6 positions shown; findings below may reference images not displayed]

FINDINGS: Moderate volume ascites is demonstrated in all 4 quadrants with the
largest volume demonstrated in the right lower and left lower
quadrants.
IMPRESSION: Moderate volume ascites.

## 2022-11-20 ENCOUNTER — Ambulatory Visit (INDEPENDENT_AMBULATORY_CARE_PROVIDER_SITE_OTHER): Payer: Medicare HMO | Admitting: Gastroenterology

## 2022-12-04 ENCOUNTER — Telehealth: Payer: Self-pay | Admitting: Internal Medicine

## 2022-12-04 NOTE — Telephone Encounter (Signed)
Patient advised.

## 2022-12-04 NOTE — Telephone Encounter (Signed)
Patient called she  went to the pharmacy to pick up medicine spironolactone (ALDACTONE) 50 MG tablet  pharmacist told her was not suppose to take this, patient thought it was 25 mg not 50 mg   Please call patient at [phone].

## 2022-12-05 ENCOUNTER — Other Ambulatory Visit (INDEPENDENT_AMBULATORY_CARE_PROVIDER_SITE_OTHER): Payer: Self-pay | Admitting: Gastroenterology

## 2022-12-05 ENCOUNTER — Telehealth (INDEPENDENT_AMBULATORY_CARE_PROVIDER_SITE_OTHER): Payer: Self-pay

## 2022-12-05 MED ORDER — SPIRONOLACTONE 25 MG PO TABS: 25.0000 mg | ORAL_TABLET | Freq: Every day | ORAL | 3 refills | Status: DC

## 2022-12-05 NOTE — Telephone Encounter (Signed)
I made the patient aware she should continue if she is not having issues with side effects. I also made her aware she should be on 25 mg. She says she thought this was right but the bottle states 50 mg daily. I have asked that she split the pills for now to make the 25 mg she needs. She would like [place] to send in a new rx reflecting the change to 25 mg to [facility].

## 2022-12-05 NOTE — Telephone Encounter (Signed)
Patient called today states when her husband picked up her Spironolactone  the pharmacy told her husband that the patient should not be taking it as they showed she had an allergic reaction to it. The reaction was It made her feel bad and yawn. Patient states she thought the dose had been decreased and this is why she has been tolerating it as of lately.I do not see that it was decreased, I see it was originally started by Dr. Posey Pronto at 25 then looks like we increased to 50 mg. She has not had any issues with the medication lately. Should she continue on it?

## 2022-12-05 NOTE — Telephone Encounter (Signed)
Patient states she is in fact taking Coreg 25 mg bid

## 2022-12-06 ENCOUNTER — Ambulatory Visit: Payer: Medicare HMO | Admitting: Cardiovascular Disease

## 2022-12-08 ENCOUNTER — Other Ambulatory Visit: Payer: Self-pay | Admitting: Internal Medicine

## 2022-12-08 DIAGNOSIS — F331 Major depressive disorder, recurrent, moderate: Secondary | ICD-10-CM

## 2022-12-09 IMAGING — US IR PARACENTESIS
1 series · 5 of 5 positions shown · non-contrast
Comparison: none

INDICATION: Decompensated cirrhosis, presumably NASH related. History of varices
and now with new ascites

[Series 1: ir (person_name)/(person_name) · 5 of 5 slices shown]
[im 1/5]
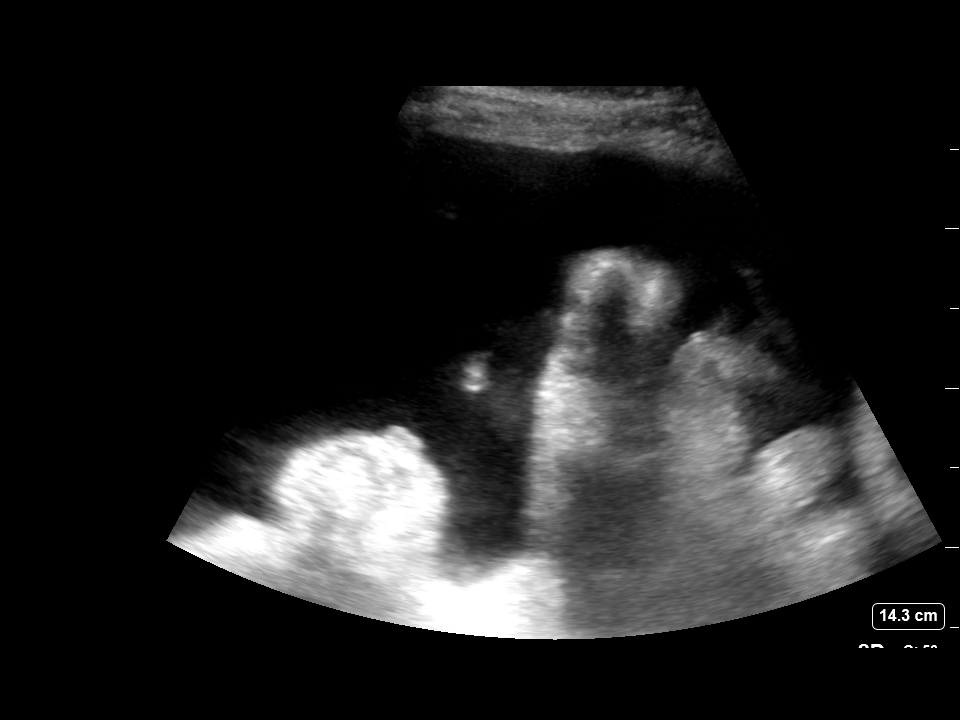
[im 2/5]
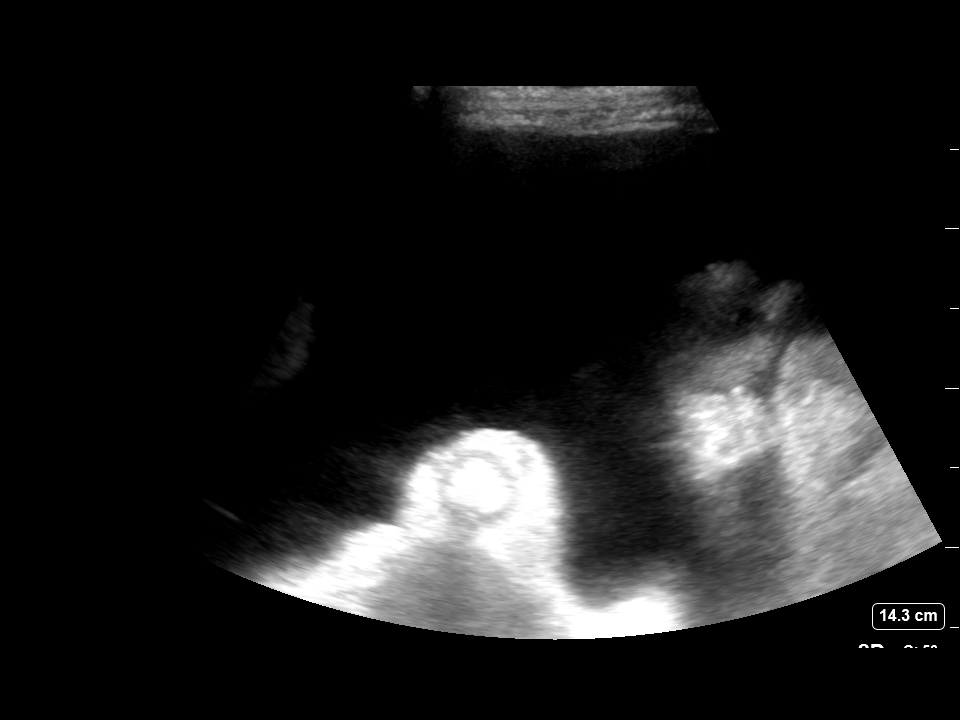
[im 3/5]
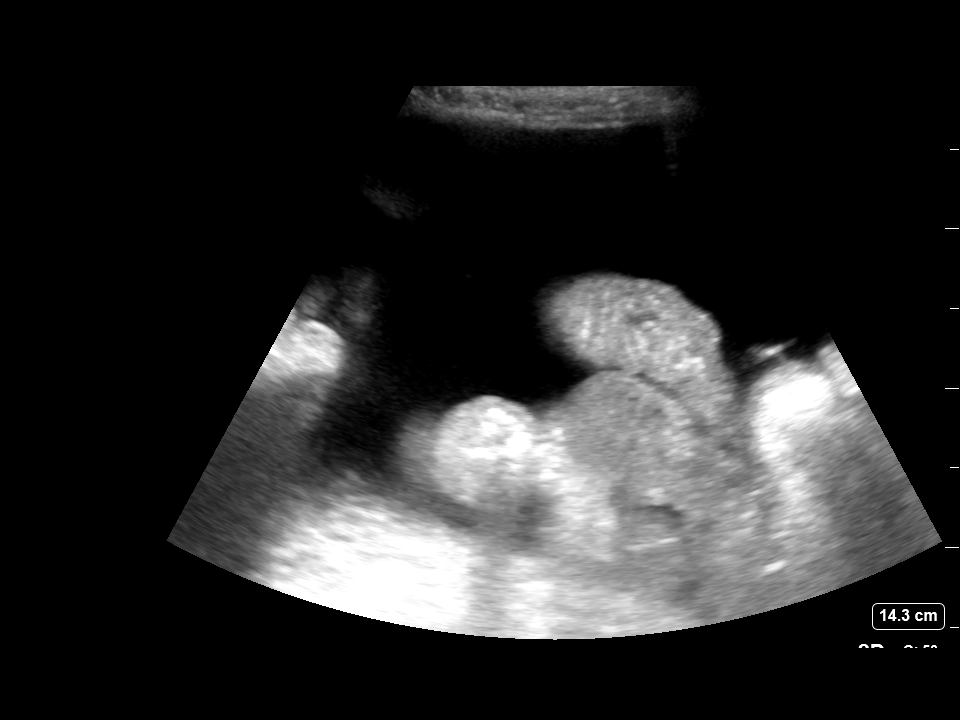
[im 4/5]
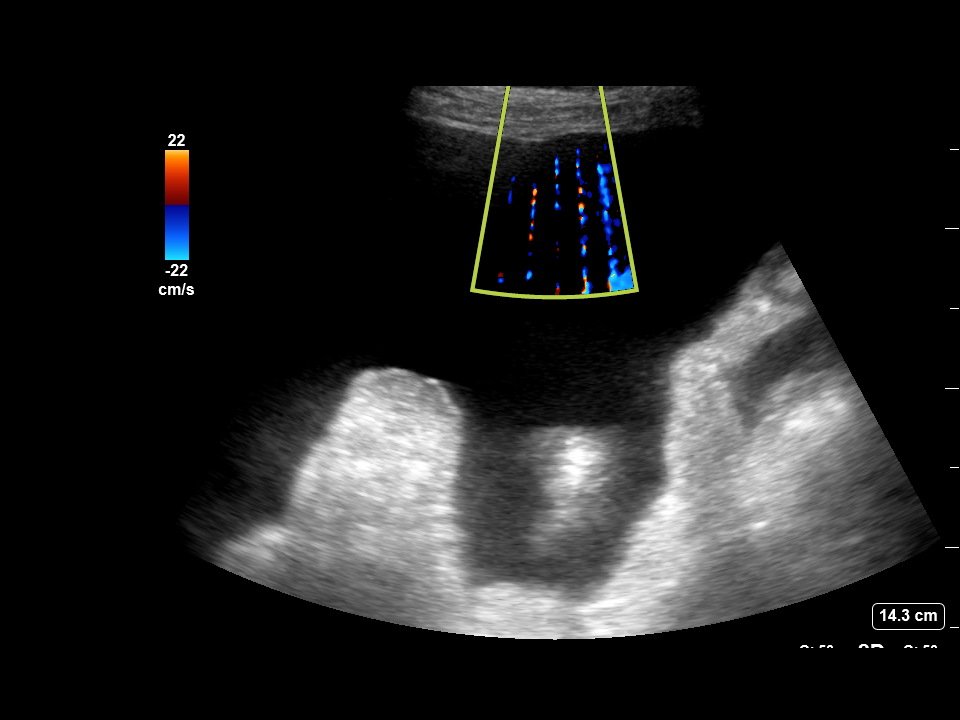
[im 5/5]
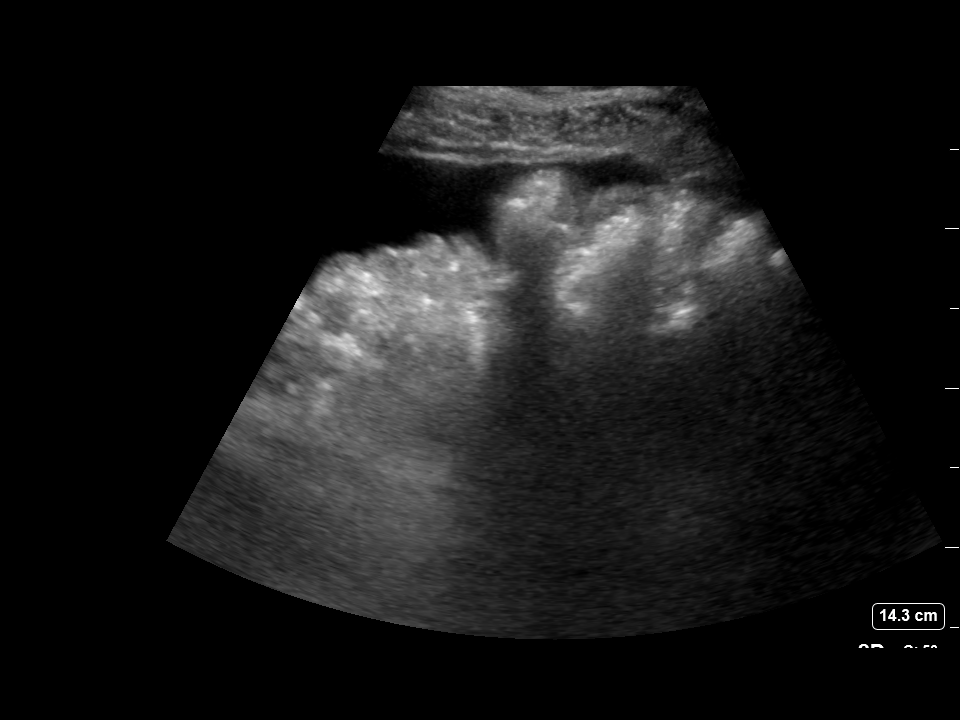

[5 of 5 positions shown; findings below may reference images not displayed]

EXAM:
ULTRASOUND GUIDED RLQ PARACENTESIS

MEDICATIONS:
None.

COMPLICATIONS:
None immediate.

PROCEDURE:
Informed written consent was obtained from the patient after a
discussion of the risks, benefits and alternatives to treatment. A
timeout was performed prior to the initiation of the procedure.

Initial ultrasound scanning demonstrates a large amount of ascites
within the right lower abdominal quadrant. The right lower abdomen
was prepped and draped in the usual sterile fashion. 1% lidocaine
was used for local anesthesia.

Following this, a 19 gauge, 7-cm, Yueh catheter was introduced. An
ultrasound image was saved for documentation purposes. The
paracentesis was performed. The catheter was removed and a dressing
was applied. The patient tolerated the procedure well without
immediate post procedural complication.
FINDINGS: A total of approximately 2.5L of straw colored ascitic fluid was
removed. Samples were sent to the laboratory as requested by the
clinical team.
IMPRESSION: Successful ultrasound-guided paracentesis yielding 2.5 liters of
peritoneal fluid.

PLAN:
If the patient eventually requires >/=2 paracenteses in a 30 day
period, candidacy for formal evaluation by the [HOSPITAL]
assessed.

## 2022-12-09 IMAGING — US US ABDOMEN COMPLETE
1 series · 14 of 25 positions shown · non-contrast
Comparison: CT abdomen 11/16/2020

CLINICAL DATA: Cirrhosis, screening

EXAM:
ABDOMEN ULTRASOUND COMPLETE

[Series 1: us abdomen complete · 14 of 116 slices shown]
[im 1/116]
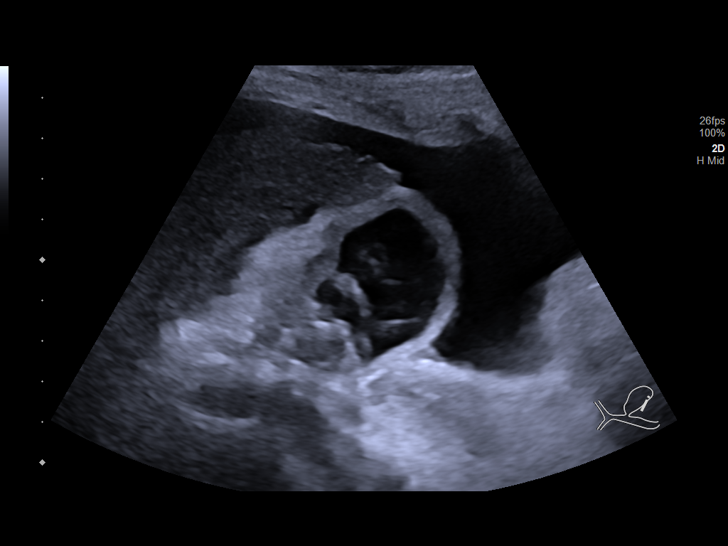
[im 10/116]
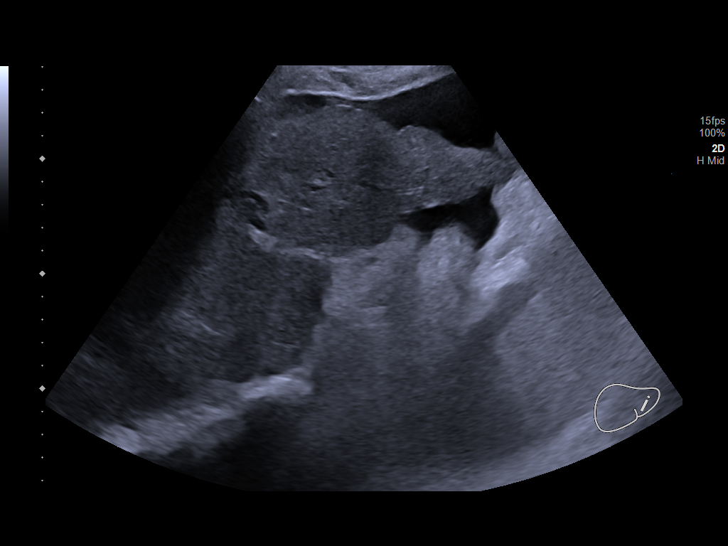
[im 20/116]
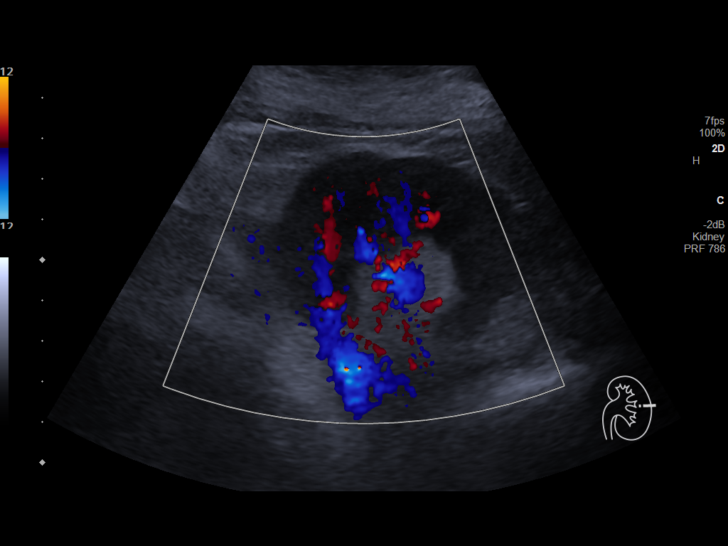
[im 29/116]
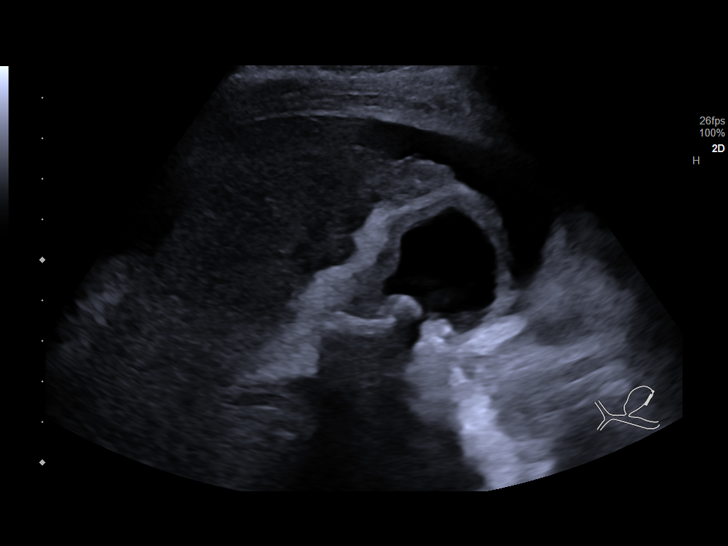
[im 39/116]
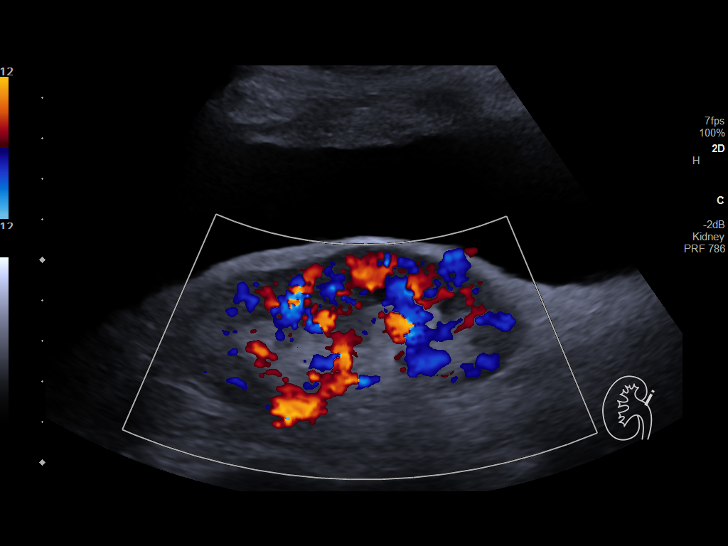
[im 44/116]
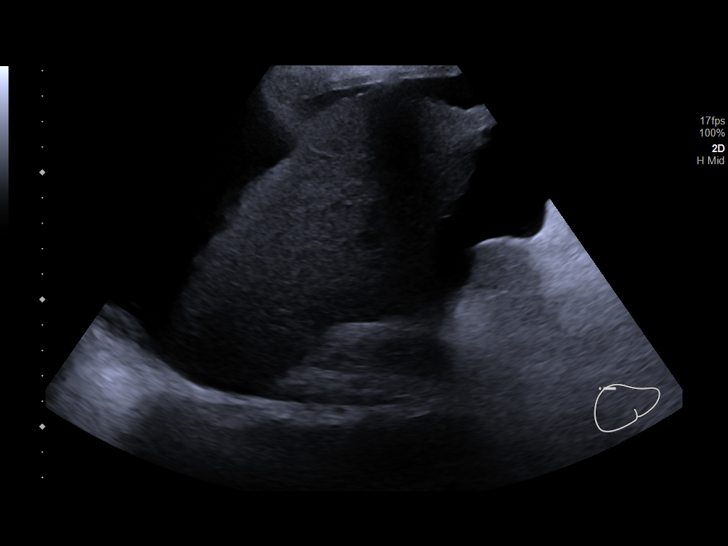
[im 53/116]
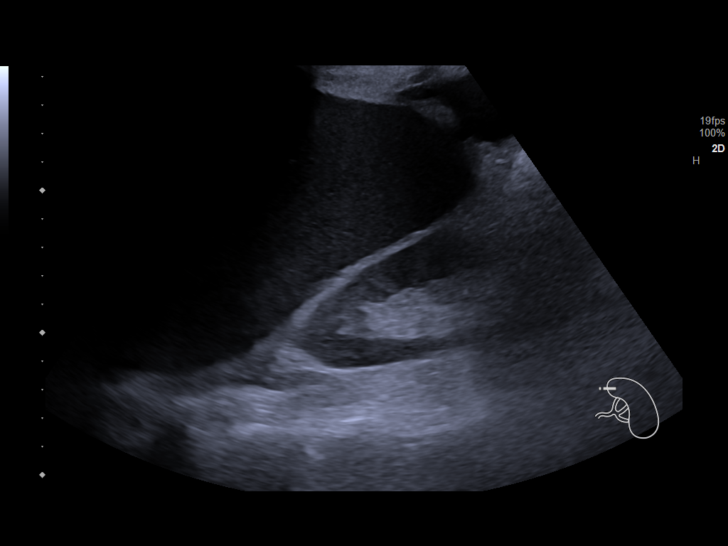
[im 63/116]
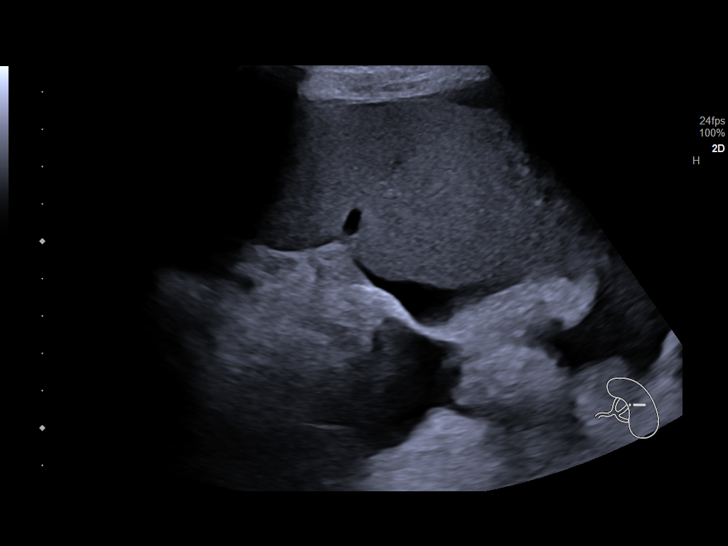
[im 72/116]
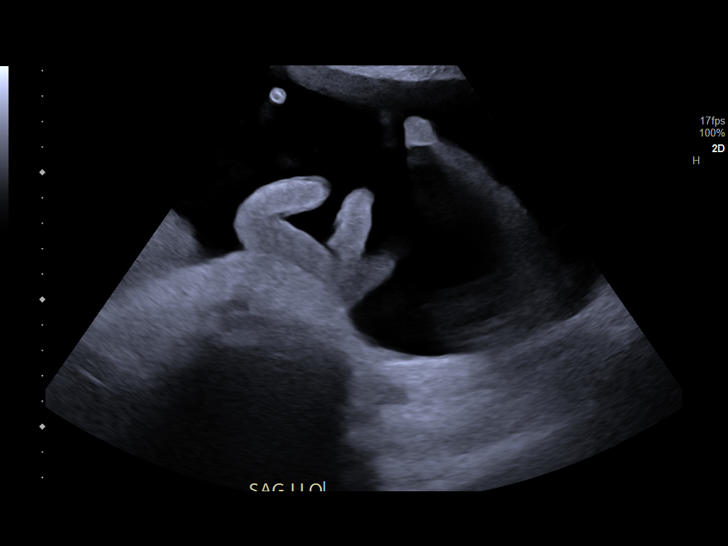
[im 77/116]
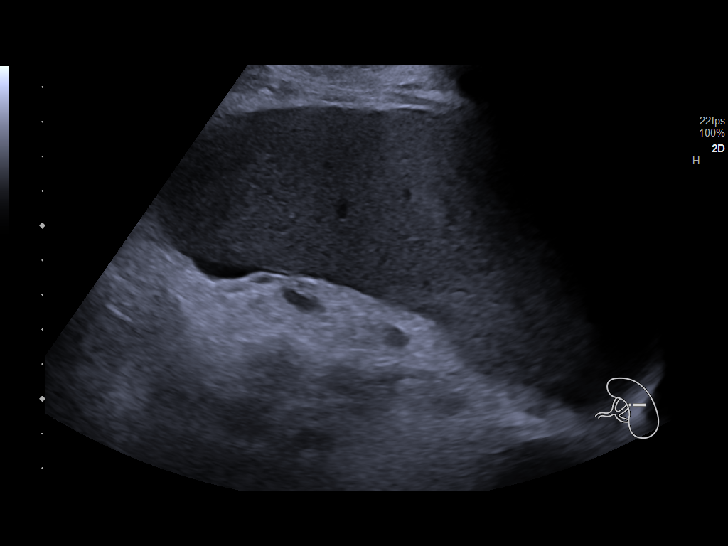
[im 87/116]
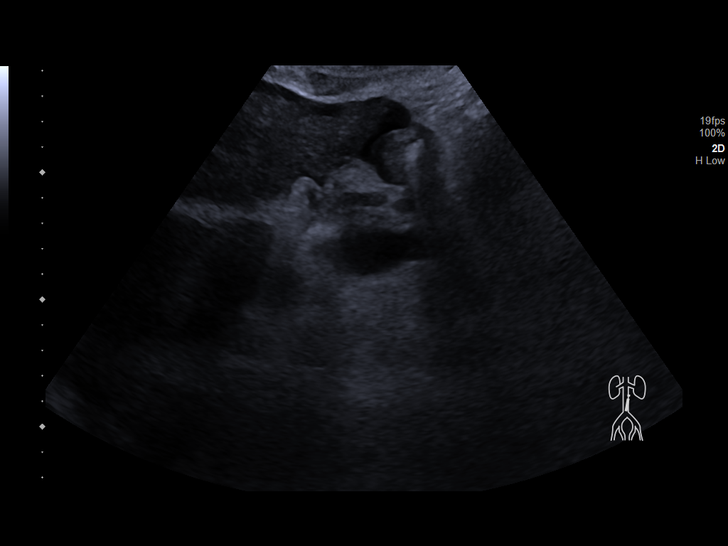
[im 96/116]
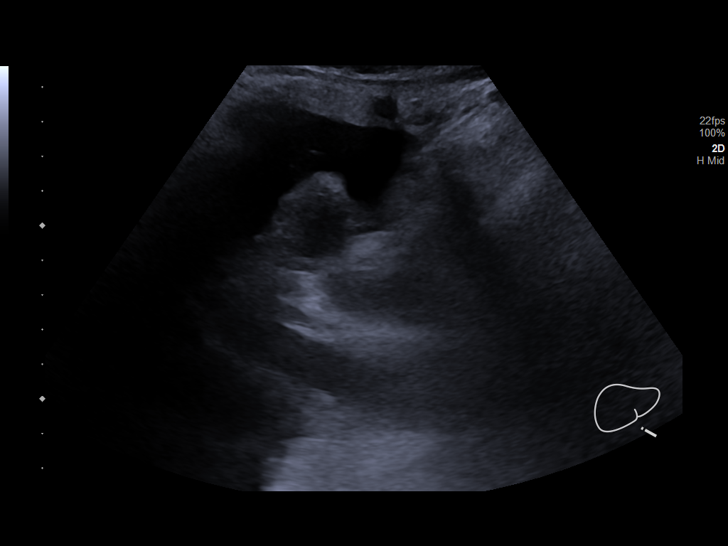
[im 106/116]
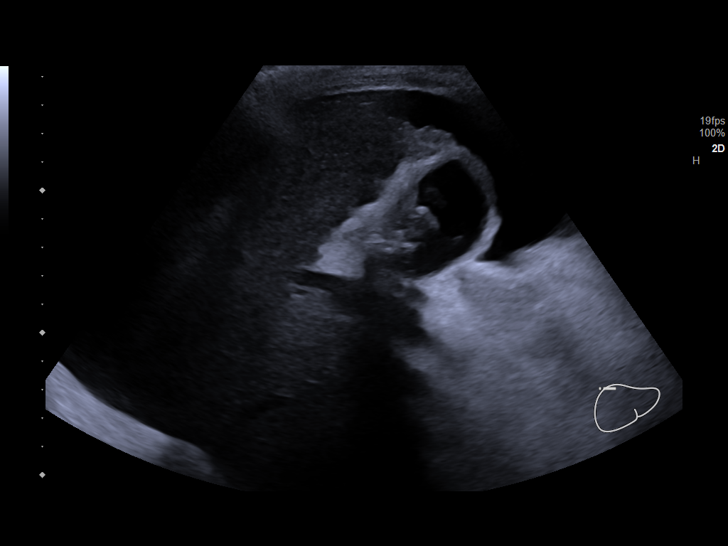
[im 116/116]
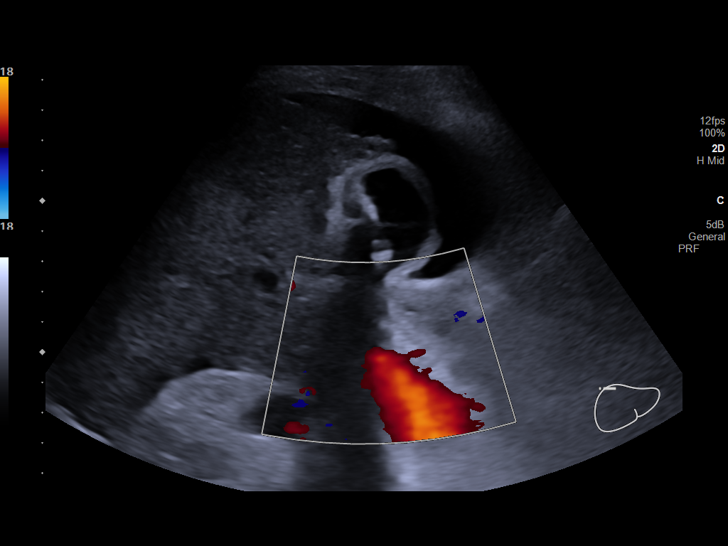

[14 of 25 positions shown; findings below may reference images not displayed]

FINDINGS: Gallbladder: Contains multiple echogenic shadowing calculi. Wall
thickening likely secondary to surrounding ascites. Negative
sonographic Murphy's sign.

Common bile duct: Diameter: 6 mm

Liver: Nodular contour with inhomogeneous parenchymal echogenicity.
No focal hepatic mass identified. Portal vein is patent on color
Doppler imaging with normal direction of blood flow towards the
liver.

IVC: No abnormality visualized.

Pancreas: Not well visualized.

Spleen: Enlarged measuring 15 cm in length.

Right Kidney: Length: 9.6 cm. Echogenicity within normal limits.
cm anechoic cyst in the lower pole. No hydronephrosis.

Left Kidney: Length: 10.3 cm. Echogenicity within normal limits. No
mass or hydronephrosis visualized.

Abdominal aorta: No aneurysm visualized.

Other findings: Moderate to large volume ascites.
IMPRESSION: 1. Hepatic cirrhosis.  No hepatic mass identified.
2. Splenomegaly likely secondary to portal hypertension.
3. Cholelithiasis.
4. Ascites.
5. Right renal cyst.

## 2023-01-02 ENCOUNTER — Ambulatory Visit (INDEPENDENT_AMBULATORY_CARE_PROVIDER_SITE_OTHER): Payer: Medicare HMO | Admitting: Internal Medicine

## 2023-01-02 ENCOUNTER — Encounter: Payer: Self-pay | Admitting: Internal Medicine

## 2023-01-02 VITALS — BP 110/49 | HR 58 | Ht 63.0 in | Wt 128.0 lb

## 2023-01-02 DIAGNOSIS — Z0001 Encounter for general adult medical examination with abnormal findings: Secondary | ICD-10-CM | POA: Diagnosis not present

## 2023-01-02 DIAGNOSIS — I700 Atherosclerosis of aorta: Secondary | ICD-10-CM | POA: Diagnosis not present

## 2023-01-02 DIAGNOSIS — M542 Cervicalgia: Secondary | ICD-10-CM

## 2023-01-02 DIAGNOSIS — I10 Essential (primary) hypertension: Secondary | ICD-10-CM | POA: Diagnosis not present

## 2023-01-02 DIAGNOSIS — E559 Vitamin D deficiency, unspecified: Secondary | ICD-10-CM | POA: Diagnosis not present

## 2023-01-02 DIAGNOSIS — F331 Major depressive disorder, recurrent, moderate: Secondary | ICD-10-CM

## 2023-01-02 DIAGNOSIS — K7460 Unspecified cirrhosis of liver: Secondary | ICD-10-CM

## 2023-01-02 DIAGNOSIS — R188 Other ascites: Secondary | ICD-10-CM

## 2023-01-02 DIAGNOSIS — E1169 Type 2 diabetes mellitus with other specified complication: Secondary | ICD-10-CM

## 2023-01-02 LAB — POCT GLYCOSYLATED HEMOGLOBIN (HGB A1C): HbA1c, POC (controlled diabetic range): 6.7 % (ref 0.0–7.0)

## 2023-01-02 MED ORDER — BACLOFEN 10 MG PO TABS: 10.0000 mg | ORAL_TABLET | Freq: Two times a day (BID) | ORAL | 2 refills | Status: DC | PRN

## 2023-01-02 NOTE — Assessment & Plan Note (Signed)
BP Readings from Last 1 Encounters:  01/02/23 (!) 110/49   Well controlled with Coreg, Lasix and spironolactone On Lasix 20 mg daily and spironolactone 25 mg daily to help with cirrhosis related ascites Counseled for compliance with the medications Advised DASH diet and moderate exercise/walking, at least 150 mins/week

## 2023-01-02 NOTE — Patient Instructions (Signed)
Please take Baclofen as needed for neck pain/stiffness.  Please continue taking other medications as prescribed.  Please continue to follow low salt diet and ambulate as tolerated.  Please start taking Ensure supplement at nighttime.

## 2023-01-02 NOTE — Assessment & Plan Note (Addendum)
Lab Results  Component Value Date   HGBA1C 6.7 01/02/2023   Associated with HTN and HLD On Glipizide 10 mg QD only now Mostly well-controlled with some fluctuation up to 190 due to inconsistent diet Did not tolerate Metformin in the past Advised to follow diabetic diet On ACEi and statin F/u CMP and lipid panel Diabetic eye exam: Advised to follow up with Ophthalmology for diabetic eye exam

## 2023-01-02 NOTE — Assessment & Plan Note (Signed)
Had paracentesis (09/23) - 3 liters of clear fluid removed Has recurrence of ascites, discussed about portacaval shunt versus repeated paracentesis with GI  Recent EGD showed portal hypertensive gastropathy and esophageal varices On B-blocker for ppx On Lasix 20 mg QD and spironolactone 25 mg QD for now

## 2023-01-02 NOTE — Progress Notes (Addendum)
Established Patient Office Visit  Subjective:  Patient ID: [name] [name], female    DOB: 29-Oct-1951  Age: 72 y.o. MRN: [id number]  CC:  Chief Complaint  Patient presents with   Hypertension    Patient is following up on her hypertension and MDD. Patient states she has pain in her back and shoulders at night and first thing in the morning    HPI [name] is a 72 y.o. female with past medical history of NASH related liver cirrhosis, portal hypertensive gastropathy, esophageal varices, HTN, AS, DM, HLD, MGUS, iron deficiency anemia, depression and anxiety who presents for annual physical.  She complains of abdominal distention although better compared to prior. She has been taking Lasix and spironolactone now.  She denies any nausea or vomiting currently.  Of note, she has history of liver cirrhosis and portal hypertensive gastropathy.  She also takes Coreg for history of esophageal varices.  She had televisit with IT for TIPS procedure, but has been told to observe for now.  Depression and anxiety: She was switched to Prozac from Effexor in the past.  Today, she still complains of anhedonia and agitation, but denies crying spells.  She also has spells of anxiety due to arguments with her husband.  Denies SI or HI.  Neck pain: She complains of neck pain, radiating towards b/l shoulders, worse in the morning, dull and intermittent since 12/03/22.  She denies any recent injury.  Denies any numbness or tingling of the UE.   Past Medical History:  Diagnosis Date   Allergy    Anemia, unspecified    Anxiety    Arthritis    Chronic diarrhea    Cirrhosis (HCC)    Depression    Depression    Elevated cholesterol    Enterocolitis due to Clostridium difficile, not specified as recurrent    Gastro-esophageal reflux disease with esophagitis    Helicobacter pylori gastritis 07/10/2017   Dx EGD -  1) Omeprazole 20 mg 2 times a day x 14 d 2) Pepto Bismol 2 tabs (262 mg each) 4 times a  day x 14 d 3) Metronidazole 250 mg 4 times a day x 14 d 4) doxycycline 100 mg 2 times a day x 14 d  After 14 d stop omeprazole also  In 4 weeks after treatment completed do H. Pylori stool antigen - dx H. Pylori gastritis    Hx of adenomatous colonic polyps 10/05/2015   Hyperlipemia 02/21/2009   NUC STRESS TEST-NORMAL- EF 74%   Hypertension    Iron deficiency anemia    Irritable bowel syndrome    Localized edema    Major depressive disorder, single episode, unspecified    MVP (mitral valve prolapse)    Rectocele    Thrombocytopenia (HCC)    Type 2 diabetes mellitus with hyperglycemia (HCC)     Past Surgical History:  Procedure Laterality Date   ABDOMINAL HYSTERECTOMY     BILATERAL SALPINGOOPHORECTOMY     carpel tunnel Bilateral 12/17/2009   COLONOSCOPY     ESOPHAGOGASTRODUODENOSCOPY     IR PARACENTESIS  05/03/2022   IR RADIOLOGIST EVAL & MGMT  11/12/2022   TUBAL LIGATION      Family History  Problem Relation Age of Onset   Anxiety disorder Mother    ADD / ADHD Brother    Alcohol abuse Brother        x 3   Drug abuse Brother    Anxiety disorder Maternal Aunt    Anxiety disorder  Maternal Grandmother    Bipolar disorder Other    Emphysema Father    Diabetes Sister    Hyperlipidemia Sister    Cirrhosis Sister    Hyperlipidemia Sister    Diabetes Sister    Diabetes Maternal Uncle    Dementia Neg Hx    OCD Neg Hx    Paranoid behavior Neg Hx    Physical abuse Neg Hx    Schizophrenia Neg Hx    Seizures Neg Hx    Sexual abuse Neg Hx    Colon cancer Neg Hx    Colon polyps Neg Hx    Pancreatic cancer Neg Hx    Esophageal cancer Neg Hx    Stomach cancer Neg Hx    Kidney disease Neg Hx    Liver disease Neg Hx    Rectal cancer Neg Hx     Social History   Socioeconomic History   Marital status: Married    Spouse name: Louie Casa   Number of children: 2   Years of education: Not on file   Highest education level: Not on file  Occupational History   Not on file  Tobacco  Use   Smoking status: Former    Years: 30.00    Types: Cigarettes    Quit date: 09/03/1997    Years since quitting: 25.3   Smokeless tobacco: Former    Types: Snuff    Quit date: 05/2022   Tobacco comments:    call when ready to quit    06/14/2022 patient stated she quit 5 or so weeks ago  Vaping Use   Vaping Use: Never used  Substance and Sexual Activity   Alcohol use: No    Alcohol/week: 0.0 standard drinks of alcohol   Drug use: No   Sexual activity: Not on file  Other Topics Concern   Not on file  Social History Narrative   Married 2 sons 1 lives in [place] 1 lives in [place]   She is a housewife   No alcohol tobacco or drug use at this time   Former smoker quit in [place] Determinants of Health   Financial Resource Strain: Low Risk  (10/30/2022)   Overall Financial Resource Strain (CARDIA)    Difficulty of Paying Living Expenses: Not hard at all  Food Insecurity: No Food Insecurity (10/30/2022)   Hunger Vital Sign    Worried About Running Out of Food in the Last Year: Never true    Ran Out of Food in the Last Year: Never true  Transportation Needs: No Transportation Needs (08/19/2021)   PRAPARE - Hydrologist (Medical): No    Lack of Transportation (Non-Medical): No  Physical Activity: Sufficiently Active (08/19/2021)   Exercise Vital Sign    Days of Exercise per Week: 5 days    Minutes of Exercise per Session: 30 min  Stress: No Stress Concern Present (10/30/2022)   [place]    Feeling of Stress : Not at all  Social Connections: Moderately Integrated (10/30/2022)   Social Connection and Isolation Panel [NHANES]    Frequency of Communication with Friends and Family: More than three times a week    Frequency of Social Gatherings with Friends and Family: Once a week    Attends Religious Services: 1 to 4 times per year    Active Member of [employer] or  Organizations: No    Attends Archivist Meetings: Not on file  Marital Status: Married  Human resources officer Violence: Not At Risk (08/19/2021)   Humiliation, Afraid, Rape, and Kick questionnaire    Fear of Current or Ex-Partner: No    Emotionally Abused: No    Physically Abused: No    Sexually Abused: No    Outpatient Medications Prior to Visit  Medication Sig Dispense Refill   BIOTIN PO Take 500 mg by mouth daily.     carvedilol (COREG) 25 MG tablet Take 1 tablet (25 mg total) by mouth 2 (two) times daily. 180 tablet 3   ferrous sulfate 325 (65 FE) MG EC tablet Take 325 mg by mouth. One daily     FLUoxetine (PROZAC) 40 MG capsule TAKE 1 CAPSULE (40 MG TOTAL) BY MOUTH DAILY. 90 capsule 1   furosemide (LASIX) 20 MG tablet Take 20 mg by mouth daily.     glipiZIDE (GLUCOTROL) 10 MG tablet Take 1 tablet (10 mg total) by mouth daily before breakfast. 1 Tablet Daily Before Breakfast 90 tablet 1   hydrALAZINE (APRESOLINE) 25 MG tablet Take 1 tablet (25 mg total) by mouth 3 (three) times daily as needed (For Systolic over 378). 90 tablet 0   ondansetron (ZOFRAN) 4 MG tablet Take 1 tablet (4 mg total) by mouth every 8 (eight) hours as needed for nausea or vomiting. 20 tablet 0   QUEtiapine (SEROQUEL) 25 MG tablet TAKE 1 TABLET BY MOUTH EVERYDAY AT BEDTIME 90 tablet 1   rosuvastatin (CRESTOR) 5 MG tablet Take 5 mg by mouth daily. Takes one on Monday, Wed., and Friday     spironolactone (ALDACTONE) 25 MG tablet Take 1 tablet (25 mg total) by mouth daily. 90 tablet 3   triamcinolone cream (KENALOG) 0.1 % APPLY TO AFFECTED AREA TWICE A DAY 30 g 0   Facility-Administered Medications Prior to Visit  Medication Dose Route Frequency Provider Last Rate Last Admin   0.9 %  sodium chloride infusion  500 mL Intravenous Once Gatha Mayer, MD        Allergies  Allergen Reactions   Codeine Other (See Comments)    Took it years ago and doesn't remember the reaction   Spironolactone Other (See  Comments)    Reports intolerance , states unable to take the medication    ROS Review of Systems  Constitutional:  Positive for fatigue. Negative for chills and fever.  HENT:  Negative for congestion, sinus pressure, sinus pain and sore throat.   Eyes:  Negative for pain and discharge.  Respiratory:  Negative for cough and shortness of breath.   Cardiovascular:  Negative for chest pain and palpitations.  Gastrointestinal:  Positive for abdominal distention. Negative for diarrhea, nausea and vomiting.  Endocrine: Negative for polydipsia and polyuria.  Genitourinary:  Negative for dysuria and hematuria.  Musculoskeletal:  Positive for neck pain. Negative for neck stiffness.  Skin:  Negative for rash.  Neurological:  Positive for dizziness. Negative for weakness.  Psychiatric/Behavioral:  Positive for sleep disturbance. Negative for agitation and behavioral problems. The patient is nervous/anxious.       Objective:    Physical Exam Vitals reviewed.  Constitutional:      General: She is not in acute distress.    Appearance: She is not diaphoretic.  HENT:     Head: Normocephalic and atraumatic.     Nose: Nose normal.     Mouth/Throat:     Mouth: Mucous membranes are moist.  Eyes:     General: No scleral icterus.    Extraocular Movements: Extraocular  movements intact.  Cardiovascular:     Rate and Rhythm: Normal rate and regular rhythm.     Pulses: Normal pulses.     Heart sounds: Murmur (Systolic over left upper sternal border) heard.  Pulmonary:     Breath sounds: Normal breath sounds. No wheezing or rales.  Abdominal:     General: There is distension.     Palpations: Abdomen is soft.     Hernia: A hernia (Umbilical) is present.  Musculoskeletal:     Cervical back: Neck supple. No tenderness.     Right lower leg: No edema.     Left lower leg: No edema.  Skin:    General: Skin is warm.     Findings: No rash.  Neurological:     General: No focal deficit present.      Mental Status: She is alert and oriented to person, place, and time.  Psychiatric:        Mood and Affect: Mood normal. Mood is not depressed.        Behavior: Behavior normal.     BP (!) 110/49 (BP Location: Left Arm, Patient Position: Sitting, Cuff Size: Normal)   Pulse (!) 58   Ht '5\' 3"'$  (1.6 m)   Wt 128 lb (58.1 kg)   SpO2 98%   BMI 22.67 kg/m  Wt Readings from Last 3 Encounters:  01/02/23 128 lb (58.1 kg)  10/29/22 119 lb 6.4 oz (54.2 kg)  09/28/22 117 lb (53.1 kg)    Lab Results  Component Value Date   TSH 2.13 02/09/2020   Lab Results  Component Value Date   WBC 4.9 10/29/2022   HGB 9.4 (L) 10/29/2022   HCT 30.1 (L) 10/29/2022   MCV 81 10/29/2022   PLT 156 10/29/2022   Lab Results  Component Value Date   NA 138 10/29/2022   K 5.1 10/29/2022   CO2 21 10/29/2022   GLUCOSE 177 (H) 10/29/2022   BUN 27 10/29/2022   CREATININE 1.07 (H) 10/29/2022   BILITOT 0.5 10/29/2022   ALKPHOS 79 10/29/2022   AST 26 10/29/2022   ALT 11 10/29/2022   PROT 8.1 10/29/2022   ALBUMIN 3.7 (L) 10/29/2022   CALCIUM 9.3 10/29/2022   ANIONGAP 7 11/29/2021   EGFR 56 (L) 10/29/2022   GFR 48.42 (L) 04/25/2022   Lab Results  Component Value Date   CHOL 214 (H) 09/05/2021   Lab Results  Component Value Date   HDL 54 09/05/2021   Lab Results  Component Value Date   LDLCALC 143 (H) 09/05/2021   Lab Results  Component Value Date   TRIG 93 09/05/2021   Lab Results  Component Value Date   CHOLHDL 4.0 09/05/2021   Lab Results  Component Value Date   HGBA1C 6.7 01/02/2023      Assessment & Plan:   Problem List Items Addressed This Visit       Cardiovascular and Mediastinum   Essential hypertension, benign    BP Readings from Last 1 Encounters:  01/02/23 (!) 110/49  Well controlled with Coreg, Lasix and spironolactone On Lasix 20 mg daily and spironolactone 25 mg daily to help with cirrhosis related ascites Counseled for compliance with the medications Advised DASH  diet and moderate exercise/walking, at least 150 mins/week      Relevant Orders   TSH   CMP14+EGFR   CBC with Differential/Platelet   Atherosclerosis of aorta (HCC)    On Crestor BP WNL now      Relevant Orders  Lipid panel     Digestive   Cirrhosis of liver with ascites ([place])    Had paracentesis (09/23) - 3 liters of clear fluid removed Has recurrence of ascites, discussed about portacaval shunt versus repeated paracentesis with GI  Recent EGD showed portal hypertensive gastropathy and esophageal varices On B-blocker for ppx On Lasix 20 mg QD and spironolactone 25 mg QD for now      Relevant Orders   CMP14+EGFR     Endocrine   Type 2 diabetes mellitus with other specified complication ([place])    Lab Results  Component Value Date   HGBA1C 6.7 01/02/2023  Associated with HTN and HLD On Glipizide 10 mg QD only now Mostly well-controlled with some fluctuation up to 190 due to inconsistent diet Did not tolerate Metformin in the past Advised to follow diabetic diet On ACEi and statin F/u CMP and lipid panel Diabetic eye exam: Advised to follow up with Ophthalmology for diabetic eye exam      Relevant Orders   Hemoglobin A1c   CMP14+EGFR   POCT glycosylated hemoglobin (Hb A1C) (Completed)     Other   Major depressive disorder, recurrent episode, moderate ([place])    Flowsheet Row Clinical Support from 10/30/2022 in [facility]  PHQ-9 Total Score 0     Overall well-controlled with Prozac 40 mg QD for now  Did not tolerate Zoloft - drowsiness and diarrhea Cymbalta was ineffective Did not like effect of Lexapro, Effexor and Rexulti Added Vraylar as she has persistent uncontrolled depression, but was not covered Referred to [facility] therapy      Relevant Orders   TSH   CBC with Differential/Platelet   Encounter for general adult medical examination with abnormal findings - Primary    Physical exam as documented. Fasting blood tests ordered.      Neck  pain    Could be from muscle strain and/or DDD of cervical spine Baclofen as needed for muscle spasms Could be from sleep posture, needs to avoid sleeping in a recliner and using very soft pillows      Relevant Medications   baclofen (LIORESAL) 10 MG tablet   Other Visit Diagnoses     Vitamin D deficiency       Relevant Orders   VITAMIN D 25 Hydroxy (Vit-D Deficiency, Fractures)       Meds ordered this encounter  Medications   baclofen (LIORESAL) 10 MG tablet    Sig: Take 1 tablet (10 mg total) by mouth 2 (two) times daily as needed for muscle spasms.    Dispense:  30 each    Refill:  2    Follow-up: Return in about 4 months (around 05/03/2023).    Lindell Spar, MD

## 2023-01-02 NOTE — Assessment & Plan Note (Addendum)
Flowsheet Row Clinical Support from 10/30/2022 in [facility]  PHQ-9 Total Score 0      Overall well-controlled with Prozac 40 mg QD for now  Did not tolerate Zoloft - drowsiness and diarrhea Cymbalta was ineffective Did not like effect of Lexapro, Effexor and Rexulti Added Vraylar as she has persistent uncontrolled depression, but was not covered Referred to [facility] therapy

## 2023-01-02 NOTE — Assessment & Plan Note (Signed)
On Crestor BP WNL now

## 2023-01-02 NOTE — Assessment & Plan Note (Signed)
Physical exam as documented. Fasting blood tests ordered. 

## 2023-01-13 ENCOUNTER — Other Ambulatory Visit: Payer: Self-pay | Admitting: Internal Medicine

## 2023-01-13 DIAGNOSIS — M542 Cervicalgia: Secondary | ICD-10-CM

## 2023-01-14 DIAGNOSIS — M542 Cervicalgia: Secondary | ICD-10-CM | POA: Insufficient documentation

## 2023-01-14 NOTE — Assessment & Plan Note (Signed)
Could be from muscle strain and/or DDD of cervical spine Baclofen as needed for muscle spasms Could be from sleep posture, needs to avoid sleeping in a recliner and using very soft pillows

## 2023-02-15 ENCOUNTER — Other Ambulatory Visit: Payer: Self-pay | Admitting: Physician Assistant

## 2023-02-19 ENCOUNTER — Ambulatory Visit (INDEPENDENT_AMBULATORY_CARE_PROVIDER_SITE_OTHER): Payer: Medicare HMO | Admitting: Internal Medicine

## 2023-02-19 ENCOUNTER — Encounter: Payer: Self-pay | Admitting: Internal Medicine

## 2023-02-19 VITALS — BP 138/52 | HR 63 | Ht 63.0 in | Wt 126.8 lb

## 2023-02-19 DIAGNOSIS — I10 Essential (primary) hypertension: Secondary | ICD-10-CM

## 2023-02-19 DIAGNOSIS — G8929 Other chronic pain: Secondary | ICD-10-CM | POA: Diagnosis not present

## 2023-02-19 DIAGNOSIS — F331 Major depressive disorder, recurrent, moderate: Secondary | ICD-10-CM

## 2023-02-19 DIAGNOSIS — M25511 Pain in right shoulder: Secondary | ICD-10-CM | POA: Diagnosis not present

## 2023-02-19 DIAGNOSIS — I8510 Secondary esophageal varices without bleeding: Secondary | ICD-10-CM | POA: Diagnosis not present

## 2023-02-19 DIAGNOSIS — G4700 Insomnia, unspecified: Secondary | ICD-10-CM

## 2023-02-19 DIAGNOSIS — M5030 Other cervical disc degeneration, unspecified cervical region: Secondary | ICD-10-CM

## 2023-02-19 DIAGNOSIS — R69 Illness, unspecified: Secondary | ICD-10-CM | POA: Diagnosis not present

## 2023-02-19 MED ORDER — METHYLPREDNISOLONE ACETATE 80 MG/ML IJ SUSP
40.0000 mg | Freq: Once | INTRAMUSCULAR | Status: AC
  Administered 2023-02-19: 40 mg via INTRAMUSCULAR

## 2023-02-19 MED ORDER — QUETIAPINE FUMARATE 50 MG PO TABS: 50.0000 mg | ORAL_TABLET | Freq: Every day | ORAL | 1 refills | Status: DC

## 2023-02-19 NOTE — Assessment & Plan Note (Signed)
Recent EGD showed portal hypertensive gastropathy and esophageal varices On B-blocker for ppx On Lasix 20 mg QD and spironolactone 25 mg QD for now

## 2023-02-19 NOTE — Patient Instructions (Signed)
Please start taking Seroquel 50 mg at bedtime.  Please continue taking Prozac 40 mg as prescribed.  Please continue to follow low carb diet and ambulate as tolerated.

## 2023-02-19 NOTE — Progress Notes (Signed)
Established Patient Office Visit  Subjective:  Patient ID: [name] [name], female    DOB: 1951-02-23  Age: 72 y.o. MRN: [id number]  CC:  Chief Complaint  Patient presents with   medication discussion    Patient states she's on two anti depressant medication, she feels the Seroquel is not working.    HPI HUGO DURAND is a 72 y.o. female with past medical history of NASH related liver cirrhosis, portal hypertensive gastropathy, esophageal varices, HTN, AS, DM, HLD, MGUS, iron deficiency anemia, depression and anxiety who presents for f/u of her chronic medical conditions.  MDD: She is currently taking Prozac 40 mg daily for it.  She was placed on Seroquel 25 mg qHS for resistant depression and insomnia.  She still complains of apathy, lack of interest in routine activities and lack of appetite.  She still complains of anhedonia and agitation, but denies crying spells.  She also has spells of anxiety due to arguments with her husband. She denies SI or HI currently.  She has tried Zoloft, Lexapro, Effexor and Rexulti before.  Neck pain: She complains of neck pain, radiating towards right shoulder, worse in the morning, dull and intermittent since 12/03/22.  She denies any recent injury.  Denies any numbness or tingling of the UE.  She was given baclofen in the last visit, but she has taken only 1 dose so far.  She has difficulty raising her shoulder on the side.  HTN: Her BP is well controlled today.  Her medication regimen was recently adjusted, and was placed on Lasix and spironolactone considering her history of liver cirrhosis and ascites.  She has been tolerating the current regimen well.  She denies any headache, dizziness, chest pain, dyspnea or palpitations.  Past Medical History:  Diagnosis Date   Allergy    Anemia, unspecified    Anxiety    Arthritis    Chronic diarrhea    Cirrhosis (HCC)    Depression    Depression    Elevated cholesterol    Enterocolitis due to  Clostridium difficile, not specified as recurrent    Gastro-esophageal reflux disease with esophagitis    Helicobacter pylori gastritis 07/10/2017   Dx EGD -  1) Omeprazole 20 mg 2 times a day x 14 d 2) Pepto Bismol 2 tabs (262 mg each) 4 times a day x 14 d 3) Metronidazole 250 mg 4 times a day x 14 d 4) doxycycline 100 mg 2 times a day x 14 d  After 14 d stop omeprazole also  In 4 weeks after treatment completed do H. Pylori stool antigen - dx H. Pylori gastritis    Hx of adenomatous colonic polyps 10/05/2015   Hyperlipemia 02/21/2009   NUC STRESS TEST-NORMAL- EF 74%   Hypertension    Iron deficiency anemia    Irritable bowel syndrome    Localized edema    Major depressive disorder, single episode, unspecified    MVP (mitral valve prolapse)    Rectocele    Thrombocytopenia (HCC)    Type 2 diabetes mellitus with hyperglycemia [facility])     Past Surgical History:  Procedure Laterality Date   ABDOMINAL HYSTERECTOMY     BILATERAL SALPINGOOPHORECTOMY     carpel tunnel Bilateral 12/17/2009   COLONOSCOPY     ESOPHAGOGASTRODUODENOSCOPY     IR PARACENTESIS  05/03/2022   IR RADIOLOGIST EVAL & MGMT  11/12/2022   TUBAL LIGATION      Family History  Problem Relation Age of Onset   Anxiety  disorder Mother    ADD / ADHD Brother    Alcohol abuse Brother        x 3   Drug abuse Brother    Anxiety disorder Maternal Aunt    Anxiety disorder Maternal Grandmother    Bipolar disorder Other    Emphysema Father    Diabetes Sister    Hyperlipidemia Sister    Cirrhosis Sister    Hyperlipidemia Sister    Diabetes Sister    Diabetes Maternal Uncle    Dementia Neg Hx    OCD Neg Hx    Paranoid behavior Neg Hx    Physical abuse Neg Hx    Schizophrenia Neg Hx    Seizures Neg Hx    Sexual abuse Neg Hx    Colon cancer Neg Hx    Colon polyps Neg Hx    Pancreatic cancer Neg Hx    Esophageal cancer Neg Hx    Stomach cancer Neg Hx    Kidney disease Neg Hx    Liver disease Neg Hx    Rectal cancer Neg  Hx     Social History   Socioeconomic History   Marital status: Married    Spouse name: Louie Casa   Number of children: 2   Years of education: Not on file   Highest education level: Not on file  Occupational History   Not on file  Tobacco Use   Smoking status: Former    Years: 30.00    Types: Cigarettes    Quit date: 09/03/1997    Years since quitting: 25.4   Smokeless tobacco: Former    Types: Snuff    Quit date: 05/2022   Tobacco comments:    call when ready to quit    06/14/2022 patient stated she quit 5 or so weeks ago  Vaping Use   Vaping Use: Never used  Substance and Sexual Activity   Alcohol use: No    Alcohol/week: 0.0 standard drinks of alcohol   Drug use: No   Sexual activity: Not on file  Other Topics Concern   Not on file  Social History Narrative   Married 2 sons 1 lives in [place] 1 lives in [place]   She is a housewife   No alcohol tobacco or drug use at this time   Former smoker quit in [place] Determinants of Health   Financial Resource Strain: Low Risk  (10/30/2022)   Overall Financial Resource Strain (CARDIA)    Difficulty of Paying Living Expenses: Not hard at all  Food Insecurity: No Food Insecurity (10/30/2022)   Hunger Vital Sign    Worried About Running Out of Food in the Last Year: Never true    Ran Out of Food in the Last Year: Never true  Transportation Needs: No Transportation Needs (08/19/2021)   PRAPARE - Hydrologist (Medical): No    Lack of Transportation (Non-Medical): No  Physical Activity: Sufficiently Active (08/19/2021)   Exercise Vital Sign    Days of Exercise per Week: 5 days    Minutes of Exercise per Session: 30 min  Stress: No Stress Concern Present (10/30/2022)   [place]    Feeling of Stress : Not at all  Social Connections: Moderately Integrated (10/30/2022)   Social Connection and Isolation Panel [NHANES]     Frequency of Communication with Friends and Family: More than three times a week    Frequency of Social Gatherings  with Friends and Family: Once a week    Attends Religious Services: 1 to 4 times per year    Active Member of [employer] or Organizations: No    Attends Music therapist: Not on file    Marital Status: Married  Human resources officer Violence: Not At Risk (08/19/2021)   Humiliation, Afraid, Rape, and Kick questionnaire    Fear of Current or Ex-Partner: No    Emotionally Abused: No    Physically Abused: No    Sexually Abused: No    Outpatient Medications Prior to Visit  Medication Sig Dispense Refill   baclofen (LIORESAL) 10 MG tablet TAKE 1 TABLET BY MOUTH TWICE A DAY AS NEEDED FOR MUSCLE SPASMS 180 tablet 1   BIOTIN PO Take 500 mg by mouth daily.     carvedilol (COREG) 25 MG tablet TAKE 1 TABLET BY MOUTH TWICE A DAY 180 tablet 0   ferrous sulfate 325 (65 FE) MG EC tablet Take 325 mg by mouth. One daily     FLUoxetine (PROZAC) 40 MG capsule TAKE 1 CAPSULE (40 MG TOTAL) BY MOUTH DAILY. 90 capsule 1   furosemide (LASIX) 20 MG tablet Take 20 mg by mouth daily.     glipiZIDE (GLUCOTROL) 10 MG tablet Take 1 tablet (10 mg total) by mouth daily before breakfast. 1 Tablet Daily Before Breakfast 90 tablet 1   hydrALAZINE (APRESOLINE) 25 MG tablet Take 1 tablet (25 mg total) by mouth 3 (three) times daily as needed (For Systolic over [id number]). 90 tablet 0   ondansetron (ZOFRAN) 4 MG tablet Take 1 tablet (4 mg total) by mouth every 8 (eight) hours as needed for nausea or vomiting. 20 tablet 0   rosuvastatin (CRESTOR) 5 MG tablet Take 5 mg by mouth daily. Takes one on Monday, Wed., and Friday     spironolactone (ALDACTONE) 25 MG tablet Take 1 tablet (25 mg total) by mouth daily. 90 tablet 3   triamcinolone cream (KENALOG) 0.1 % APPLY TO AFFECTED AREA TWICE A DAY 30 g 0   QUEtiapine (SEROQUEL) 25 MG tablet TAKE 1 TABLET BY MOUTH EVERYDAY AT BEDTIME 90 tablet 1   Facility-Administered  Medications Prior to Visit  Medication Dose Route Frequency Provider Last Rate Last Admin   0.9 %  sodium chloride infusion  500 mL Intravenous Once Gatha Mayer, MD        Allergies  Allergen Reactions   Codeine Other (See Comments)    Took it years ago and doesn't remember the reaction   Spironolactone Other (See Comments)    Reports intolerance , states unable to take the medication    ROS Review of Systems  Constitutional:  Positive for fatigue. Negative for chills and fever.  HENT:  Negative for congestion, sinus pressure, sinus pain and sore throat.   Eyes:  Negative for pain and discharge.  Respiratory:  Negative for cough and shortness of breath.   Cardiovascular:  Negative for chest pain and palpitations.  Gastrointestinal:  Positive for abdominal distention. Negative for diarrhea, nausea and vomiting.  Endocrine: Negative for polydipsia and polyuria.  Genitourinary:  Negative for dysuria and hematuria.  Musculoskeletal:  Negative for neck pain and neck stiffness.  Skin:  Negative for rash.  Neurological:  Negative for dizziness and weakness.  Psychiatric/Behavioral:  Positive for dysphoric mood and sleep disturbance. Negative for agitation and behavioral problems. The patient is nervous/anxious.       Objective:    Physical Exam Vitals reviewed.  Constitutional:  General: She is not in acute distress.    Appearance: She is not diaphoretic.  HENT:     Head: Normocephalic and atraumatic.     Nose: Nose normal.     Mouth/Throat:     Mouth: Mucous membranes are moist.  Eyes:     General: No scleral icterus.    Extraocular Movements: Extraocular movements intact.  Cardiovascular:     Rate and Rhythm: Normal rate and regular rhythm.     Pulses: Normal pulses.     Heart sounds: Murmur (Systolic over left upper sternal border) heard.  Pulmonary:     Breath sounds: Normal breath sounds. No wheezing or rales.  Abdominal:     General: There is distension.      Palpations: Abdomen is soft.     Hernia: A hernia (Umbilical) is present.  Musculoskeletal:     Cervical back: Neck supple. No tenderness.     Right lower leg: No edema.     Left lower leg: No edema.  Skin:    General: Skin is warm.     Findings: No rash.  Neurological:     General: No focal deficit present.     Mental Status: She is alert and oriented to person, place, and time.  Psychiatric:        Mood and Affect: Mood normal. Mood is not depressed.        Behavior: Behavior normal.     BP (!) 138/52 (BP Location: Left Arm, Patient Position: Sitting, Cuff Size: Normal)   Pulse 63   Ht '5\' 3"'$  (1.6 m)   Wt 126 lb 12.8 oz (57.5 kg)   SpO2 98%   BMI 22.46 kg/m  Wt Readings from Last 3 Encounters:  02/19/23 126 lb 12.8 oz (57.5 kg)  01/02/23 128 lb (58.1 kg)  10/29/22 119 lb 6.4 oz (54.2 kg)    Lab Results  Component Value Date   TSH 2.13 02/09/2020   Lab Results  Component Value Date   WBC 4.9 10/29/2022   HGB 9.4 (L) 10/29/2022   HCT 30.1 (L) 10/29/2022   MCV 81 10/29/2022   PLT 156 10/29/2022   Lab Results  Component Value Date   NA 138 10/29/2022   K 5.1 10/29/2022   CO2 21 10/29/2022   GLUCOSE 177 (H) 10/29/2022   BUN 27 10/29/2022   CREATININE 1.07 (H) 10/29/2022   BILITOT 0.5 10/29/2022   ALKPHOS 79 10/29/2022   AST 26 10/29/2022   ALT 11 10/29/2022   PROT 8.1 10/29/2022   ALBUMIN 3.7 (L) 10/29/2022   CALCIUM 9.3 10/29/2022   ANIONGAP 7 11/29/2021   EGFR 56 (L) 10/29/2022   GFR 48.42 (L) 04/25/2022   Lab Results  Component Value Date   CHOL 214 (H) 09/05/2021   Lab Results  Component Value Date   HDL 54 09/05/2021   Lab Results  Component Value Date   LDLCALC 143 (H) 09/05/2021   Lab Results  Component Value Date   TRIG 93 09/05/2021   Lab Results  Component Value Date   CHOLHDL 4.0 09/05/2021   Lab Results  Component Value Date   HGBA1C 6.7 01/02/2023      Assessment & Plan:   Problem List Items Addressed This Visit        Cardiovascular and Mediastinum   Essential hypertension, benign    BP Readings from Last 1 Encounters:  02/19/23 (!) 138/52  Well controlled with Coreg, Lasix and spironolactone On Lasix 20 mg daily and spironolactone  25 mg daily to help with cirrhosis related ascites Counseled for compliance with the medications Advised DASH diet and moderate exercise/walking, at least 150 mins/week      Secondary esophageal varices without bleeding (HCC)    Recent EGD showed portal hypertensive gastropathy and esophageal varices On B-blocker for ppx On Lasix 20 mg QD and spironolactone 25 mg QD for now        Musculoskeletal and Integument   DDD (degenerative disc disease), cervical    Has chronic neck pain, radiating to the right shoulder area-likely due to DDD of cervical area Needs to take baclofen for muscle spasms Depo-Medrol 40 mg IM today - should also help with shoulder pain If persistent pain, will get imaging        Other   Insomnia   Major depressive disorder, recurrent episode, moderate ([place]) - Primary    [place] Office Visit from 02/19/2023 in [facility]  PHQ-9 Total Score 11     Uncontrolled with Prozac 40 mg QD for now and Seroquel 25 mg qHS Increased dose of Seroquel to 50 mg qHS   Did not tolerate Zoloft - drowsiness and diarrhea Cymbalta was ineffective Did not like effect of Lexapro, Effexor and Rexulti Had added Vraylar as she has persistent uncontrolled depression, but was not covered Referred to [facility] therapy      Relevant Medications   QUEtiapine (SEROQUEL) 50 MG tablet   Other Relevant Orders   Ambulatory referral to Psychiatry   Chronic right shoulder pain    Right shoulder pain with stiffness-could be rotator cuff injury or referred pain from cervical spine Depo-Medrol 40 mg IM today If persistent pain, will get imaging and/or orthopedic surgery evaluation       Meds ordered this encounter  Medications   QUEtiapine  (SEROQUEL) 50 MG tablet    Sig: Take 1 tablet (50 mg total) by mouth at bedtime.    Dispense:  90 tablet    Refill:  1    PLEASE CANCEL SEROQUEL 25 MG AND PROZAC 20 MG DOSE.   methylPREDNISolone acetate (DEPO-MEDROL) injection 40 mg    Follow-up: Return if symptoms worsen or fail to improve.    Lindell Spar, MD

## 2023-02-19 NOTE — Assessment & Plan Note (Addendum)
[place] Office Visit from 02/19/2023 in [facility]  PHQ-9 Total Score 11      Uncontrolled with Prozac 40 mg QD for now and Seroquel 25 mg qHS Increased dose of Seroquel to 50 mg qHS   Did not tolerate Zoloft - drowsiness and diarrhea Cymbalta was ineffective Did not like effect of Lexapro, Effexor and Rexulti Had added Vraylar as she has persistent uncontrolled depression, but was not covered Referred to [facility] therapy

## 2023-02-19 NOTE — Assessment & Plan Note (Signed)
Right shoulder pain with stiffness-could be rotator cuff injury or referred pain from cervical spine Depo-Medrol 40 mg IM today If persistent pain, will get imaging and/or orthopedic surgery evaluation

## 2023-02-19 NOTE — Assessment & Plan Note (Signed)
Has chronic neck pain, radiating to the right shoulder area-likely due to DDD of cervical area Needs to take baclofen for muscle spasms Depo-Medrol 40 mg IM today - should also help with shoulder pain If persistent pain, will get imaging

## 2023-02-19 NOTE — Assessment & Plan Note (Signed)
BP Readings from Last 1 Encounters:  02/19/23 (!) 138/52   Well controlled with Coreg, Lasix and spironolactone On Lasix 20 mg daily and spironolactone 25 mg daily to help with cirrhosis related ascites Counseled for compliance with the medications Advised DASH diet and moderate exercise/walking, at least 150 mins/week

## 2023-02-27 ENCOUNTER — Encounter (INDEPENDENT_AMBULATORY_CARE_PROVIDER_SITE_OTHER): Payer: Self-pay | Admitting: Gastroenterology

## 2023-03-14 ENCOUNTER — Other Ambulatory Visit: Payer: Self-pay | Admitting: Internal Medicine

## 2023-04-11 ENCOUNTER — Encounter (INDEPENDENT_AMBULATORY_CARE_PROVIDER_SITE_OTHER): Payer: Self-pay | Admitting: *Deleted

## 2023-04-18 DIAGNOSIS — H5203 Hypermetropia, bilateral: Secondary | ICD-10-CM | POA: Diagnosis not present

## 2023-04-18 LAB — HM DIABETES EYE EXAM

## 2023-04-26 ENCOUNTER — Other Ambulatory Visit: Payer: Self-pay | Admitting: Interventional Radiology

## 2023-04-26 DIAGNOSIS — K7581 Nonalcoholic steatohepatitis (NASH): Secondary | ICD-10-CM

## 2023-04-29 ENCOUNTER — Ambulatory Visit (INDEPENDENT_AMBULATORY_CARE_PROVIDER_SITE_OTHER): Payer: Medicare HMO | Admitting: Gastroenterology

## 2023-04-29 DIAGNOSIS — K7460 Unspecified cirrhosis of liver: Secondary | ICD-10-CM | POA: Diagnosis not present

## 2023-04-29 DIAGNOSIS — E1169 Type 2 diabetes mellitus with other specified complication: Secondary | ICD-10-CM | POA: Diagnosis not present

## 2023-04-29 DIAGNOSIS — F331 Major depressive disorder, recurrent, moderate: Secondary | ICD-10-CM | POA: Diagnosis not present

## 2023-04-29 DIAGNOSIS — R188 Other ascites: Secondary | ICD-10-CM | POA: Diagnosis not present

## 2023-04-29 DIAGNOSIS — I700 Atherosclerosis of aorta: Secondary | ICD-10-CM | POA: Diagnosis not present

## 2023-04-29 DIAGNOSIS — I10 Essential (primary) hypertension: Secondary | ICD-10-CM | POA: Diagnosis not present

## 2023-04-29 DIAGNOSIS — E559 Vitamin D deficiency, unspecified: Secondary | ICD-10-CM | POA: Diagnosis not present

## 2023-04-30 ENCOUNTER — Telehealth (INDEPENDENT_AMBULATORY_CARE_PROVIDER_SITE_OTHER): Payer: Self-pay | Admitting: Gastroenterology

## 2023-04-30 ENCOUNTER — Other Ambulatory Visit (INDEPENDENT_AMBULATORY_CARE_PROVIDER_SITE_OTHER): Payer: Self-pay | Admitting: Gastroenterology

## 2023-04-30 DIAGNOSIS — R188 Other ascites: Secondary | ICD-10-CM

## 2023-04-30 LAB — CBC WITH DIFFERENTIAL/PLATELET

## 2023-04-30 NOTE — Telephone Encounter (Signed)
Pt left voicemail needing schedule 6 month [place] RUQ.  Order placed and pt schedule for 05/02/23. Returned call to patient and gave appt info. NPO after midnight 05/02/23 at 8:30 [facility]

## 2023-05-01 LAB — CMP14+EGFR
ALT: 9 IU/L (ref 0–32)
AST: 23 IU/L (ref 0–40)
Albumin/Globulin Ratio: 0.9 — ABNORMAL LOW (ref 1.2–2.2)
Albumin: 3.6 g/dL — ABNORMAL LOW (ref 3.9–4.9)
Alkaline Phosphatase: 85 IU/L (ref 44–121)
BUN/Creatinine Ratio: 16 (ref 12–28)
BUN: 16 mg/dL (ref 8–27)
Bilirubin Total: 0.6 mg/dL (ref 0.0–1.2)
CO2: 24 mmol/L (ref 20–29)
Calcium: 8.8 mg/dL (ref 8.7–10.3)
Chloride: 106 mmol/L (ref 96–106)
Creatinine, Ser: 0.97 mg/dL (ref 0.57–1.00)
Globulin, Total: 4.2 g/dL (ref 1.5–4.5)
Glucose: 123 mg/dL — ABNORMAL HIGH (ref 70–99)
Potassium: 4.6 mmol/L (ref 3.5–5.2)
Sodium: 142 mmol/L (ref 134–144)
Total Protein: 7.8 g/dL (ref 6.0–8.5)
eGFR: 63 mL/min/{1.73_m2} (ref >59)

## 2023-05-01 LAB — CBC WITH DIFFERENTIAL/PLATELET
Basophils Absolute: 0 10*3/uL (ref 0.0–0.2)
Basos: 1 %
EOS (ABSOLUTE): 0.1 10*3/uL (ref 0.0–0.4)
Eos: 2 %
Hematocrit: 30.3 % — ABNORMAL LOW (ref 34.0–46.6)
Hemoglobin: 9.9 g/dL — ABNORMAL LOW (ref 11.1–15.9)
Immature Grans (Abs): 0 10*3/uL (ref 0.0–0.1)
Immature Granulocytes: 0 %
Lymphocytes Absolute: 0.6 10*3/uL — ABNORMAL LOW (ref 0.7–3.1)
Lymphs: 17 %
MCH: 25.7 pg — ABNORMAL LOW (ref 26.6–33.0)
MCHC: 32.7 g/dL (ref 31.5–35.7)
MCV: 79 fL (ref 79–97)
Monocytes Absolute: 0.4 10*3/uL (ref 0.1–0.9)
Monocytes: 11 %
Neutrophils Absolute: 2.5 10*3/uL (ref 1.4–7.0)
Neutrophils: 69 %
Platelets: 102 10*3/uL — ABNORMAL LOW (ref 150–450)
RBC: 3.85 x10E6/uL (ref 3.77–5.28)
RDW: 15.2 % (ref 11.7–15.4)
WBC: 3.6 10*3/uL (ref 3.4–10.8)

## 2023-05-01 LAB — LIPID PANEL
Chol/HDL Ratio: 3.6 ratio (ref 0.0–4.4)
Cholesterol, Total: 163 mg/dL (ref 100–199)
HDL: 45 mg/dL (ref >39)
LDL Chol Calc (NIH): 101 mg/dL — ABNORMAL HIGH (ref 0–99)
Triglycerides: 94 mg/dL (ref 0–149)
VLDL Cholesterol Cal: 17 mg/dL (ref 5–40)

## 2023-05-01 LAB — HEMOGLOBIN A1C
Est. average glucose Bld gHb Est-mCnc: 203 mg/dL
Hgb A1c MFr Bld: 8.7 % — ABNORMAL HIGH (ref 4.8–5.6)

## 2023-05-01 LAB — VITAMIN D 25 HYDROXY (VIT D DEFICIENCY, FRACTURES): Vit D, 25-Hydroxy: 43.2 ng/mL (ref 30.0–100.0)

## 2023-05-01 LAB — TSH: TSH: 2.76 u[IU]/mL (ref 0.450–4.500)

## 2023-05-02 ENCOUNTER — Ambulatory Visit (HOSPITAL_COMMUNITY)
Admission: RE | Admit: 2023-05-02 | Discharge: 2023-05-02 | Disposition: A | Discharge status: Home / Self Care | Payer: Medicare HMO | Source: Ambulatory Visit | Attending: Gastroenterology | Admitting: Gastroenterology

## 2023-05-02 ENCOUNTER — Other Ambulatory Visit (INDEPENDENT_AMBULATORY_CARE_PROVIDER_SITE_OTHER): Payer: Self-pay | Admitting: Gastroenterology

## 2023-05-02 DIAGNOSIS — K7460 Unspecified cirrhosis of liver: Secondary | ICD-10-CM | POA: Insufficient documentation

## 2023-05-02 DIAGNOSIS — R188 Other ascites: Secondary | ICD-10-CM

## 2023-05-03 ENCOUNTER — Ambulatory Visit (HOSPITAL_COMMUNITY)
Admission: RE | Admit: 2023-05-03 | Discharge: 2023-05-03 | Disposition: A | Discharge status: Home / Self Care | Payer: Medicare HMO | Source: Ambulatory Visit | Attending: Gastroenterology | Admitting: Gastroenterology

## 2023-05-03 ENCOUNTER — Ambulatory Visit: Payer: Medicare HMO | Admitting: Internal Medicine

## 2023-05-03 ENCOUNTER — Encounter (HOSPITAL_COMMUNITY): Payer: Self-pay

## 2023-05-03 DIAGNOSIS — R188 Other ascites: Secondary | ICD-10-CM | POA: Diagnosis not present

## 2023-05-03 LAB — BODY FLUID CELL COUNT WITH DIFFERENTIAL
Eos, Fluid: 0 %
Lymphs, Fluid: 49 %
Monocyte-Macrophage-Serous Fluid: 49 % — ABNORMAL LOW (ref 50–90)
Neutrophil Count, Fluid: 2 % (ref 0–25)
Total Nucleated Cell Count, Fluid: 209 cu mm (ref 0–1000)

## 2023-05-03 LAB — GRAM STAIN

## 2023-05-03 MED ORDER — ALBUMIN HUMAN 25 % IV SOLN
0.0000 g | Freq: Once | INTRAVENOUS | Status: DC
  Filled 2023-05-03: qty 400

## 2023-05-03 NOTE — Progress Notes (Signed)
Patient tolerated right sided paracentesis procedure well today and 2.4 Liters of clear yellow ascites removed with labs collected/sent for processing. Patient verbalized understanding of discharge instructions and ambulatory at departure with no acute distress noted.

## 2023-05-03 NOTE — Procedures (Signed)
PROCEDURE SUMMARY:  Successful image-guided paracentesis from the right lower abdomen.  Yielded 2.4 liters of yellow fluid.  No immediate complications.  EBL = trace. Patient tolerated well.   Specimen was sent for labs.  Please see imaging section of Epic for full dictation.   Kennieth Francois PA-C 05/03/2023 1:16 PM

## 2023-05-04 LAB — CULTURE, BODY FLUID W GRAM STAIN -BOTTLE

## 2023-05-05 LAB — CULTURE, BODY FLUID W GRAM STAIN -BOTTLE

## 2023-05-07 ENCOUNTER — Ambulatory Visit (INDEPENDENT_AMBULATORY_CARE_PROVIDER_SITE_OTHER): Payer: Medicare HMO | Admitting: Gastroenterology

## 2023-05-07 ENCOUNTER — Encounter (INDEPENDENT_AMBULATORY_CARE_PROVIDER_SITE_OTHER): Payer: Self-pay | Admitting: Gastroenterology

## 2023-05-07 VITALS — BP 119/55 | HR 57 | Temp 99.1°F | Ht 63.0 in | Wt 130.0 lb

## 2023-05-07 DIAGNOSIS — K7460 Unspecified cirrhosis of liver: Secondary | ICD-10-CM | POA: Diagnosis not present

## 2023-05-07 DIAGNOSIS — R188 Other ascites: Secondary | ICD-10-CM | POA: Diagnosis not present

## 2023-05-07 DIAGNOSIS — D649 Anemia, unspecified: Secondary | ICD-10-CM

## 2023-05-07 MED ORDER — SPIRONOLACTONE 25 MG PO TABS: 25.0000 mg | ORAL_TABLET | Freq: Every day | ORAL | 3 refills | Status: DC

## 2023-05-07 NOTE — Patient Instructions (Signed)
Please Restart Spironolactone 25mg  daily  Continue your lasix 20mg  daily If you have recurrent fluid build up in your abdomen you need to let me know immediately  -Continue coreg 25mg  BID  We will update labs and check your iron levels  - Reduce salt intake to <2 g per day - Can take Tylenol max of 2 g per day (650 mg q8h) for pain - Avoid NSAIDs for pain - Avoid eating raw oysters/shellfish - Ensure every night before going to sleep  Follow up 6 months

## 2023-05-07 NOTE — Progress Notes (Addendum)
Referring Provider: Anabel Halon, MD Primary Care Physician:  Anabel Halon, MD Primary GI Physician: Levon Hedger   Chief Complaint  Patient presents with   Cirrhosis    Follow up on Cirrhosis. Had some fluid taken off last Friday. Pt reports they took off about 2 and half liters.    HPI:   [name] [name] is a 72 y.o. female with past medical history of NASH, MGUS, H pylori gastritis treatd in 2018, IDA, adenomatous colon polyps, moderate aortic stenosis, HTN.    Patient presenting today for follow up of cirrhosis.  Last seen November 2023, at that time felt ascites is better since being on double diuretic therapy. She states that on the higher dose of diuretics she was feeling more tired. She is now on lasix 20mg  and spironolactone 25mg , seems to be tolerating these relatively well, she is urinating often. Still with some fluid/fullness in her abdomen but Denies any tautness as previously. She does note umbilical hernia that bothers her, no pain associated.  having a BM 1-2x/day without diarrhea or constipation.     She reports compliance with coreg 25mg  BID, HR is 52, with BP 132/58  Recommended referral for TIPS, RUQ [place], MELD labs, CBC, AFP, continue coreg 25mg  BID, continue lasix 20mg  daily, spironolactone 25mg  daily.  TIPS evaluation with Dr. Elby Showers 11/12/22-not felt a candidate due to low ascites  AFP 2 in November RUQ [place] 04/2023 Cirrhotic morphology. No focal lesion identified. Cholelithiasis.Perihepatic ascites.  Paracentesis on 5/17 with 2.5 L yield  Present: She reports she is taking lasix but unsure of the last time she took spironolactone as she ran out and the pharmacy never contacted her to refill. Over the past few weeks she had noted more swelling to her abdomen, feeling full when eatings. She is feeling better since paracentesis.  She is taking an iron pill once daily. She has continued fecal urgency but usually just one BM per day. No rectal bleeding or melena.    She is taking coreg twice a day and denies dizziness or syncope.   Previous MELD 3.0 (INR from November): 8   Cirrhosis related questions: Episodes of confusion/disorientation: no Taking diuretics? Lasix 20mg  -not taking her spironolactone Beta blockers? Carvedilol 25mg  BID Prior history of variceal banding? No  Prior episodes of SBP? No  Last liver imaging:May 2024, as above  Alcohol use: No    Last Colonoscopy:03/04/19- One diminutive polyp in the transverse colon, diminutive SSP - The examination was otherwise normal on direct and retroflexion views. - Personal history of colonic polyps. 2 adenomas max 15 mm 2016 Last Endoscopy:April 2022 with grade II varices, portal hypertensive gastropathy   Recommendations:  TCS due March 2025   Past Medical History:  Diagnosis Date   Allergy    Anemia, unspecified    Anxiety    Arthritis    Chronic diarrhea    Cirrhosis (HCC)    Depression    Depression    Elevated cholesterol    Enterocolitis due to Clostridium difficile, not specified as recurrent    Gastro-esophageal reflux disease with esophagitis    Helicobacter pylori gastritis 07/10/2017   Dx EGD -  1) Omeprazole 20 mg 2 times a day x 14 d 2) Pepto Bismol 2 tabs (262 mg each) 4 times a day x 14 d 3) Metronidazole 250 mg 4 times a day x 14 d 4) doxycycline 100 mg 2 times a day x 14 d  After 14 d stop omeprazole also  In 4 weeks after treatment completed do H. Pylori stool antigen - dx H. Pylori gastritis    Hx of adenomatous colonic polyps 10/05/2015   Hyperlipemia 02/21/2009   NUC STRESS TEST-NORMAL- EF 74%   Hypertension    Iron deficiency anemia    Irritable bowel syndrome    Localized edema    Major depressive disorder, single episode, unspecified    MVP (mitral valve prolapse)    Rectocele    Thrombocytopenia (HCC)    Type 2 diabetes mellitus with hyperglycemia (HCC)     Past Surgical History:  Procedure Laterality Date   ABDOMINAL HYSTERECTOMY     BILATERAL  SALPINGOOPHORECTOMY     carpel tunnel Bilateral 12/17/2009   COLONOSCOPY     ESOPHAGOGASTRODUODENOSCOPY     IR PARACENTESIS  05/03/2022   IR RADIOLOGIST EVAL & MGMT  11/12/2022   TUBAL LIGATION      Current Outpatient Medications  Medication Sig Dispense Refill   baclofen (LIORESAL) 10 MG tablet TAKE 1 TABLET BY MOUTH TWICE A DAY AS NEEDED FOR MUSCLE SPASMS 180 tablet 1   BIOTIN PO Take 500 mg by mouth daily.     carvedilol (COREG) 25 MG tablet TAKE 1 TABLET BY MOUTH TWICE A DAY 180 tablet 0   ferrous sulfate 325 (65 FE) MG EC tablet Take 325 mg by mouth. One daily     FLUoxetine (PROZAC) 40 MG capsule TAKE 1 CAPSULE (40 MG TOTAL) BY MOUTH DAILY. 90 capsule 1   furosemide (LASIX) 20 MG tablet Take 20 mg by mouth daily.     glipiZIDE (GLUCOTROL) 10 MG tablet TAKE 1 TABLET(10MG  TOTAL) BY MOUTH DAILY BEFORE BREAKFAST 90 tablet 1   hydrALAZINE (APRESOLINE) 25 MG tablet Take 1 tablet (25 mg total) by mouth 3 (three) times daily as needed (For Systolic over 180). 90 tablet 0   ondansetron (ZOFRAN) 4 MG tablet Take 1 tablet (4 mg total) by mouth every 8 (eight) hours as needed for nausea or vomiting. 20 tablet 0   QUEtiapine (SEROQUEL) 50 MG tablet Take 1 tablet (50 mg total) by mouth at bedtime. 90 tablet 1   rosuvastatin (CRESTOR) 5 MG tablet Take 5 mg by mouth daily. Takes one on Monday, Wed., and Friday     triamcinolone cream (KENALOG) 0.1 % APPLY TO AFFECTED AREA TWICE A DAY 30 g 0   spironolactone (ALDACTONE) 25 MG tablet Take 1 tablet (25 mg total) by mouth daily. 90 tablet 3   Current Facility-Administered Medications  Medication Dose Route Frequency Provider Last Rate Last Admin   0.9 %  sodium chloride infusion  500 mL Intravenous Once [name], MD        Allergies as of 05/07/2023 - Review Complete 05/07/2023  Allergen Reaction Noted   Codeine Other (See Comments)    Spironolactone Other (See Comments) 10/09/2021    Family History  Problem Relation Age of Onset    Anxiety disorder Mother    ADD / ADHD Brother    Alcohol abuse Brother        x 3   Drug abuse Brother    Anxiety disorder Maternal Aunt    Anxiety disorder Maternal Grandmother    Bipolar disorder Other    Emphysema Father    Diabetes Sister    Hyperlipidemia Sister    Cirrhosis Sister    Hyperlipidemia Sister    Diabetes Sister    Diabetes Maternal Uncle    Dementia Neg Hx    OCD Neg Hx  Paranoid behavior Neg Hx    Physical abuse Neg Hx    Schizophrenia Neg Hx    Seizures Neg Hx    Sexual abuse Neg Hx    Colon cancer Neg Hx    Colon polyps Neg Hx    Pancreatic cancer Neg Hx    Esophageal cancer Neg Hx    Stomach cancer Neg Hx    Kidney disease Neg Hx    Liver disease Neg Hx    Rectal cancer Neg Hx     Social History   Socioeconomic History   Marital status: Married    Spouse name: Harvie Heck   Number of children: 2   Years of education: Not on file   Highest education level: Not on file  Occupational History   Not on file  Tobacco Use   Smoking status: Former    Years: 30    Types: Cigarettes    Quit date: 09/03/1997    Years since quitting: 25.6    Passive exposure: Past   Smokeless tobacco: Former    Types: Snuff    Quit date: 05/2022   Tobacco comments:    call when ready to quit    06/14/2022 patient stated she quit 5 or so weeks ago  Vaping Use   Vaping Use: Never used  Substance and Sexual Activity   Alcohol use: No    Alcohol/week: 0.0 standard drinks of alcohol   Drug use: No   Sexual activity: Not on file  Other Topics Concern   Not on file  Social History Narrative   Married 2 sons 1 lives in [place] 1 lives in [place]   She is a housewife   No alcohol tobacco or drug use at this time   Former smoker quit in 1998   Social Determinants of Health   Financial Resource Strain: Low Risk  (10/30/2022)   Overall Financial Resource Strain (CARDIA)    Difficulty of Paying Living Expenses: Not hard at all  Food Insecurity: No Food  Insecurity (10/30/2022)   Hunger Vital Sign    Worried About Running Out of Food in the Last Year: Never true    Ran Out of Food in the Last Year: Never true  Transportation Needs: No Transportation Needs (08/19/2021)   PRAPARE - Administrator, Civil Service (Medical): No    Lack of Transportation (Non-Medical): No  Physical Activity: Sufficiently Active (08/19/2021)   Exercise Vital Sign    Days of Exercise per Week: 5 days    Minutes of Exercise per Session: 30 min  Stress: No Stress Concern Present (10/30/2022)   [employer] of Occupational Health - Occupational Stress Questionnaire    Feeling of Stress : Not at all  Social Connections: Moderately Integrated (10/30/2022)   Social Connection and Isolation Panel [NHANES]    Frequency of Communication with Friends and Family: More than three times a week    Frequency of Social Gatherings with Friends and Family: Once a week    Attends Religious Services: 1 to 4 times per year    Active Member of [employer] or Organizations: No    Attends Engineer, structural: Not on file    Marital Status: Married   Review of systems General: negative for malaise, night sweats, fever, chills, weight loss Neck: Negative for lumps, goiter, pain and significant neck swelling Resp: Negative for cough, wheezing, dyspnea at rest CV: Negative for chest pain, leg swelling, palpitations, orthopnea GI: denies melena, hematochezia, nausea, vomiting, diarrhea,  constipation, dysphagia, odyonophagia, early satiety or unintentional weight loss. +swelling to abdomen MSK: Negative for joint pain or swelling, back pain, and muscle pain. Derm: Negative for itching or rash Psych: Denies depression, anxiety, memory loss, confusion. No homicidal or suicidal ideation.  Heme: Negative for prolonged bleeding, bruising easily, and swollen nodes. Endocrine: Negative for cold or heat intolerance, polyuria, polydipsia and goiter. Neuro: negative for tremor,  gait imbalance, syncope and seizures. The remainder of the review of systems is noncontributory.  Physical Exam: BP (!) 119/55 (BP Location: Left Arm, Patient Position: Sitting, Cuff Size: Normal)   Pulse (!) 57   Temp 99.1 F (37.3 C) (Oral)   Ht 5\' 3"  (1.6 m)   Wt 130 lb (59 kg)   BMI 23.03 kg/m  General:   Alert and oriented. No distress noted. Pleasant and cooperative.  Head:  Normocephalic and atraumatic. Eyes:  Conjuctiva clear without scleral icterus. Mouth:  Oral mucosa pink and moist. Good dentition. No lesions. Heart: Normal rate and rhythm, s1 and s2 heart sounds present.  Lungs: Clear lung sounds in all lobes. Respirations equal and unlabored. Abdomen:  +BS, soft, non-tender and non-distended. No rebound or guarding. No HSM or masses noted. Derm: No palmar erythema or jaundice Msk:  Symmetrical without gross deformities. Normal posture. Extremities:  Without edema. Neurologic:  Alert and  oriented x4. No asterixis  Psych:  Alert and cooperative. Normal mood and affect.  Invalid input(s): "6 MONTHS"   ASSESSMENT: [name] [name] is a 72 y.o. female presenting today for follow up of cirrhosis  Cirrhosis: MELD has remained relatively low with last MELD 3.0 of 8. history of Grade II EVs on last EGD in 2022, on coreg 25mg  BID with reported compliance, denies episodes of confusion. EGD done by [facility] and after discussion with them, it was felt she did not need repeat EGD right now due to being on a BB. Ascites had been well managed on spironolactone 25mg  and lasix 20mg  daily. Not felt to be a TIPS candidate due to ability to titrate uptitration of diuretics, appeared to be more of a compliance issue previously vs. Intolerance. She reportedly has not bee on spironolactone for a while, she is unsure of timing or why she stopped other than running out. Required Para for ascites on 5/17 with 2.5 L yield, no SBP. She needs to continue with lasix and restart her Spironolactone 25mg   daily. If ascites persists, will need to titrate her diuretic therapy. She up to date on labs other than INR and AFP, due for liver imaging for [facility] screening in November 2024.   Anemia: taking iron pill daily. No rectal bleeding or melena. Hgb 9.9 on 5/13, appears she has had some mild anemia since may 2023 with MCV 79. Etiology of anemia unclear. Will check Iron studies today for further evaluation. If Iron studies reveal continued Iron deficiency, will need to consider updating EGD/Colonoscopy. Will continue with daily PO iron for now.    PLAN:  -Restart Spironolactone 25mg  daily  -continue lasix 20mg  daily -Continue coreg 25mg  BID  - INR, AFP  -Iron studies( consider EGD/Colonoscopy if iron low) -pt to make me aware if ascites persist - Reduce salt intake to <2 g per day - Can take Tylenol max of 2 g per day (650 mg q8h) for pain - Avoid NSAIDs for pain - Avoid eating raw oysters/shellfish - Ensure every night before going to sleep  All questions were answered, patient verbalized understanding and is in agreement with plan  as outlined above.    Follow Up: 6 months   Fauna Neuner L. Jeanmarie Hubert, MSN, APRN, AGNP-C Adult-Gerontology Nurse Practitioner [facility]  I have reviewed the note and agree with the APP's assessment as described in this progress note  Katrinka Blazing, MD Gastroenterology and Hepatology [facility]

## 2023-05-08 LAB — CULTURE, BODY FLUID W GRAM STAIN -BOTTLE: Culture: NO GROWTH

## 2023-05-08 LAB — IRON,TIBC AND FERRITIN PANEL
%SAT: 12 % (calc) — ABNORMAL LOW (ref 16–45)
Ferritin: 69 ng/mL (ref 16–288)
Iron: 33 ug/dL — ABNORMAL LOW (ref 45–160)
TIBC: 270 mcg/dL (calc) (ref 250–450)

## 2023-05-08 LAB — PROTIME-INR
INR: 1.1
Prothrombin Time: 11.6 s — ABNORMAL HIGH (ref 9.0–11.5)

## 2023-05-08 LAB — AFP TUMOR MARKER: AFP-Tumor Marker: 2.6 ng/mL

## 2023-05-08 LAB — CYTOLOGY - NON PAP

## 2023-05-16 ENCOUNTER — Other Ambulatory Visit: Payer: Self-pay | Admitting: Physician Assistant

## 2023-05-23 ENCOUNTER — Ambulatory Visit (HOSPITAL_COMMUNITY)
Admission: RE | Admit: 2023-05-23 | Discharge: 2023-05-23 | Disposition: A | Discharge status: Home / Self Care | Payer: Medicare HMO | Source: Ambulatory Visit | Attending: Internal Medicine | Admitting: Internal Medicine

## 2023-05-23 ENCOUNTER — Encounter: Payer: Self-pay | Admitting: Internal Medicine

## 2023-05-23 ENCOUNTER — Ambulatory Visit (INDEPENDENT_AMBULATORY_CARE_PROVIDER_SITE_OTHER): Payer: Medicare HMO | Admitting: Internal Medicine

## 2023-05-23 VITALS — BP 136/62 | HR 63 | Ht 63.0 in | Wt 132.4 lb

## 2023-05-23 DIAGNOSIS — R188 Other ascites: Secondary | ICD-10-CM

## 2023-05-23 DIAGNOSIS — E1169 Type 2 diabetes mellitus with other specified complication: Secondary | ICD-10-CM

## 2023-05-23 DIAGNOSIS — K7460 Unspecified cirrhosis of liver: Secondary | ICD-10-CM

## 2023-05-23 DIAGNOSIS — G8929 Other chronic pain: Secondary | ICD-10-CM

## 2023-05-23 DIAGNOSIS — M25511 Pain in right shoulder: Secondary | ICD-10-CM | POA: Insufficient documentation

## 2023-05-23 DIAGNOSIS — F331 Major depressive disorder, recurrent, moderate: Secondary | ICD-10-CM

## 2023-05-23 DIAGNOSIS — D696 Thrombocytopenia, unspecified: Secondary | ICD-10-CM | POA: Diagnosis not present

## 2023-05-23 DIAGNOSIS — Z7984 Long term (current) use of oral hypoglycemic drugs: Secondary | ICD-10-CM | POA: Diagnosis not present

## 2023-05-23 DIAGNOSIS — I10 Essential (primary) hypertension: Secondary | ICD-10-CM

## 2023-05-23 DIAGNOSIS — E782 Mixed hyperlipidemia: Secondary | ICD-10-CM | POA: Diagnosis not present

## 2023-05-23 MED ORDER — ROSUVASTATIN CALCIUM 5 MG PO TABS: 5.0000 mg | ORAL_TABLET | Freq: Every day | ORAL | 1 refills | Status: DC

## 2023-05-23 MED ORDER — QUETIAPINE FUMARATE 50 MG PO TABS: 50.0000 mg | ORAL_TABLET | Freq: Two times a day (BID) | ORAL | 1 refills | Status: DC

## 2023-05-23 MED ORDER — GLIPIZIDE 10 MG PO TABS
10.0000 mg | ORAL_TABLET | Freq: Two times a day (BID) | ORAL | 1 refills | Status: DC
Start: 2023-05-23 — End: 2023-08-20

## 2023-05-23 MED ORDER — KETOROLAC TROMETHAMINE 60 MG/2ML IM SOLN
30.0000 mg | Freq: Once | INTRAMUSCULAR | Status: AC
Start: 2023-05-23 — End: 2023-05-23
  Administered 2023-05-23: 30 mg via INTRAMUSCULAR

## 2023-05-23 MED ORDER — FUROSEMIDE 40 MG PO TABS: 40.0000 mg | ORAL_TABLET | Freq: Every day | ORAL | 1 refills | Status: DC

## 2023-05-23 NOTE — Assessment & Plan Note (Addendum)
Has seen heme-onc Likely due to underlying NASH and/or nutritional deficiencies Continue iron supplement for anemia Has easy bruising Check CBC

## 2023-05-23 NOTE — Assessment & Plan Note (Addendum)
On Crestor 5 mg QD QOD Check lipid profile

## 2023-05-23 NOTE — Patient Instructions (Addendum)
Please start taking Lasix 40 mg once daily.  Please start taking Glipizide 10 mg twice daily.  Please start taking Seroquel 50 mg twice daily.  Please continue to follow low carb diet and ambulate as tolerated.

## 2023-05-23 NOTE — Assessment & Plan Note (Signed)
Lab Results  Component Value Date   HGBA1C 8.7 (H) 04/29/2023   Associated with HTN and HLD On Glipizide 10 mg QD only now Uncontrolled due to inconsistent diet Increased glipizide to 10 mg BID Did not tolerate Metformin in the past Advised to follow diabetic diet On ACEi and statin F/u CMP and lipid panel Diabetic eye exam: Advised to follow up with Ophthalmology for diabetic eye exam

## 2023-05-23 NOTE — Assessment & Plan Note (Signed)
Had paracentesis (05/24) - 2.4 liters of clear fluid removed Has recurrence of ascites, discussed about portacaval shunt versus repeated paracentesis with GI  Recent EGD showed portal hypertensive gastropathy and esophageal varices On B-blocker for ppx Increased dose of Lasix to 40 mg QD

## 2023-05-23 NOTE — Assessment & Plan Note (Addendum)
Flowsheet Row Office Visit from 02/19/2023 in [facility]  PHQ-9 Total Score 11      Uncontrolled with Prozac 40 mg QD for now and Seroquel 50 mg qHS Increased dose of Seroquel to 50 mg BID   Did not tolerate Zoloft - drowsiness and diarrhea Cymbalta was ineffective Did not like effect of Lexapro, Effexor and Rexulti Had added Vraylar as she has persistent uncontrolled depression, but was not covered Referred to [facility] therapy

## 2023-05-23 NOTE — Assessment & Plan Note (Addendum)
Right shoulder pain with stiffness-could be rotator cuff injury or referred pain from cervical spine Toradol 30 mg IM today Has persistent pain, will get x-ray of right shoulder

## 2023-05-23 NOTE — Progress Notes (Signed)
Established Patient Office Visit  Subjective:  Patient ID: [name] [name], female    DOB: 1951-09-03  Age: 72 y.o. MRN: [id number]  CC:  Chief Complaint  Patient presents with   Depression    Follow up , patient states her anti depressant is not helping    HPI [name] [name] is a 73 y.o. female with past medical history of NASH related liver cirrhosis, portal hypertensive gastropathy, esophageal varices, HTN, AS, DM, HLD, MGUS, iron deficiency anemia, depression and anxiety who presents for f/u of her chronic medical conditions.  DM: Her HbA1c has increased to 8.7 now.  She is taking glipizide 10 mg QD, but admits that she has not been following low-carb diet.  Denies any polyuria or polyphagia currently.  HTN: Her BP is well controlled today.  Her medication regimen was recently adjusted, and was placed on Lasix and spironolactone considering her history of liver cirrhosis and ascites.  She could not continue spironolactone as she had myalgias with it.  She denies any headache, dizziness, chest pain, dyspnea or palpitations.  NASH related liver cirrhosis: She has recurrent ascites, had paracentesis on 05/19 -about 2.4 L fluid was removed.  Of note, she has not been taking spironolactone and takes only 20 mg of Lasix currently.  MDD: She is currently taking Prozac 40 mg daily for it.  She was placed on Seroquel 50 mg qHS for resistant depression and insomnia.  She still complains of apathy, lack of interest in routine activities and lack of appetite.  She still complains of anhedonia and agitation, but denies crying spells.  She also has spells of anxiety due to arguments with her husband. She denies SI or HI currently.  She has tried Zoloft, Lexapro, Effexor and Rexulti before.  She complains of right shoulder pain, worse in the morning, dull and intermittent since 12/03/22.  She denies any recent injury.  Denies any numbness or tingling of the UE.  She was given baclofen in the last  visit, but she has not taken it regularly.  She has difficulty raising her shoulder on the side.   Past Medical History:  Diagnosis Date   Allergy    Anemia, unspecified    Anxiety    Arthritis    Chronic diarrhea    Cirrhosis (HCC)    Depression    Depression    Elevated cholesterol    Enterocolitis due to Clostridium difficile, not specified as recurrent    Gastro-esophageal reflux disease with esophagitis    Helicobacter pylori gastritis 07/10/2017   Dx EGD -  1) Omeprazole 20 mg 2 times a day x 14 d 2) Pepto Bismol 2 tabs (262 mg each) 4 times a day x 14 d 3) Metronidazole 250 mg 4 times a day x 14 d 4) doxycycline 100 mg 2 times a day x 14 d  After 14 d stop omeprazole also  In 4 weeks after treatment completed do H. Pylori stool antigen - dx H. Pylori gastritis    Hx of adenomatous colonic polyps 10/05/2015   Hyperlipemia 02/21/2009   NUC STRESS TEST-NORMAL- EF 74%   Hypertension    Iron deficiency anemia    Irritable bowel syndrome    Localized edema    Major depressive disorder, single episode, unspecified    MVP (mitral valve prolapse)    Rectocele    Thrombocytopenia (HCC)    Type 2 diabetes mellitus with hyperglycemia [facility])     Past Surgical History:  Procedure Laterality Date  ABDOMINAL HYSTERECTOMY     BILATERAL SALPINGOOPHORECTOMY     carpel tunnel Bilateral 12/17/2009   COLONOSCOPY     ESOPHAGOGASTRODUODENOSCOPY     IR PARACENTESIS  05/03/2022   IR RADIOLOGIST EVAL & MGMT  11/12/2022   TUBAL LIGATION      Family History  Problem Relation Age of Onset   Anxiety disorder Mother    ADD / ADHD Brother    Alcohol abuse Brother        x 3   Drug abuse Brother    Anxiety disorder Maternal Aunt    Anxiety disorder Maternal Grandmother    Bipolar disorder Other    Emphysema Father    Diabetes Sister    Hyperlipidemia Sister    Cirrhosis Sister    Hyperlipidemia Sister    Diabetes Sister    Diabetes Maternal Uncle    Dementia Neg Hx    OCD Neg Hx     Paranoid behavior Neg Hx    Physical abuse Neg Hx    Schizophrenia Neg Hx    Seizures Neg Hx    Sexual abuse Neg Hx    Colon cancer Neg Hx    Colon polyps Neg Hx    Pancreatic cancer Neg Hx    Esophageal cancer Neg Hx    Stomach cancer Neg Hx    Kidney disease Neg Hx    Liver disease Neg Hx    Rectal cancer Neg Hx     Social History   Socioeconomic History   Marital status: Married    Spouse name: Harvie Heck   Number of children: 2   Years of education: Not on file   Highest education level: Not on file  Occupational History   Not on file  Tobacco Use   Smoking status: Former    Years: 30    Types: Cigarettes    Quit date: 09/03/1997    Years since quitting: 25.7    Passive exposure: Past   Smokeless tobacco: Former    Types: Snuff    Quit date: 05/2022   Tobacco comments:    call when ready to quit    06/14/2022 patient stated she quit 5 or so weeks ago  Vaping Use   Vaping Use: Never used  Substance and Sexual Activity   Alcohol use: No    Alcohol/week: 0.0 standard drinks of alcohol   Drug use: No   Sexual activity: Not on file  Other Topics Concern   Not on file  Social History Narrative   Married 2 sons 1 lives in [place] 1 lives in [place]   She is a housewife   No alcohol tobacco or drug use at this time   Former smoker quit in 1998   Social Determinants of Health   Financial Resource Strain: Low Risk  (10/30/2022)   Overall Financial Resource Strain (CARDIA)    Difficulty of Paying Living Expenses: Not hard at all  Food Insecurity: No Food Insecurity (10/30/2022)   Hunger Vital Sign    Worried About Running Out of Food in the Last Year: Never true    Ran Out of Food in the Last Year: Never true  Transportation Needs: No Transportation Needs (08/19/2021)   PRAPARE - Administrator, Civil Service (Medical): No    Lack of Transportation (Non-Medical): No  Physical Activity: Sufficiently Active (08/19/2021)   Exercise Vital Sign     Days of Exercise per Week: 5 days    Minutes of Exercise per Session:  30 min  Stress: No Stress Concern Present (10/30/2022)   [employer] of Occupational Health - Occupational Stress Questionnaire    Feeling of Stress : Not at all  Social Connections: Moderately Integrated (10/30/2022)   Social Connection and Isolation Panel [NHANES]    Frequency of Communication with Friends and Family: More than three times a week    Frequency of Social Gatherings with Friends and Family: Once a week    Attends Religious Services: 1 to 4 times per year    Active Member of [employer] or Organizations: No    Attends Engineer, structural: Not on file    Marital Status: Married  Catering manager Violence: Not At Risk (08/19/2021)   Humiliation, Afraid, Rape, and Kick questionnaire    Fear of Current or Ex-Partner: No    Emotionally Abused: No    Physically Abused: No    Sexually Abused: No    Outpatient Medications Prior to Visit  Medication Sig Dispense Refill   baclofen (LIORESAL) 10 MG tablet TAKE 1 TABLET BY MOUTH TWICE A DAY AS NEEDED FOR MUSCLE SPASMS 180 tablet 1   BIOTIN PO Take 500 mg by mouth daily.     carvedilol (COREG) 25 MG tablet TAKE 1 TABLET BY MOUTH TWICE A DAY 180 tablet 3   ferrous sulfate 325 (65 FE) MG EC tablet Take 325 mg by mouth. One daily     FLUoxetine (PROZAC) 40 MG capsule TAKE 1 CAPSULE (40 MG TOTAL) BY MOUTH DAILY. 90 capsule 1   hydrALAZINE (APRESOLINE) 25 MG tablet Take 1 tablet (25 mg total) by mouth 3 (three) times daily as needed (For Systolic over 180). 90 tablet 0   ondansetron (ZOFRAN) 4 MG tablet Take 1 tablet (4 mg total) by mouth every 8 (eight) hours as needed for nausea or vomiting. 20 tablet 0   spironolactone (ALDACTONE) 25 MG tablet Take 1 tablet (25 mg total) by mouth daily. 90 tablet 3   triamcinolone cream (KENALOG) 0.1 % APPLY TO AFFECTED AREA TWICE A DAY 30 g 0   furosemide (LASIX) 20 MG tablet Take 20 mg by mouth daily.     glipiZIDE  (GLUCOTROL) 10 MG tablet TAKE 1 TABLET(10MG  TOTAL) BY MOUTH DAILY BEFORE BREAKFAST 90 tablet 1   QUEtiapine (SEROQUEL) 50 MG tablet Take 1 tablet (50 mg total) by mouth at bedtime. 90 tablet 1   rosuvastatin (CRESTOR) 5 MG tablet Take 5 mg by mouth daily. Takes one on Monday, Wed., and Friday     Facility-Administered Medications Prior to Visit  Medication Dose Route Frequency Provider Last Rate Last Admin   0.9 %  sodium chloride infusion  500 mL Intravenous Once [name], MD        Allergies  Allergen Reactions   Codeine Other (See Comments)    Took it years ago and doesn't remember the reaction   Spironolactone Other (See Comments)    Reports intolerance , states unable to take the medication    ROS Review of Systems  Constitutional:  Positive for fatigue. Negative for chills and fever.  HENT:  Negative for congestion, sinus pressure, sinus pain and sore throat.   Eyes:  Negative for pain and discharge.  Respiratory:  Negative for cough and shortness of breath.   Cardiovascular:  Negative for chest pain and palpitations.  Gastrointestinal:  Positive for abdominal distention. Negative for diarrhea, nausea and vomiting.  Endocrine: Negative for polydipsia and polyuria.  Genitourinary:  Negative for dysuria and hematuria.  Musculoskeletal:  Positive for arthralgias. Negative for neck pain and neck stiffness.  Skin:  Negative for rash.  Neurological:  Negative for dizziness and weakness.  Psychiatric/Behavioral:  Positive for dysphoric mood and sleep disturbance. Negative for agitation and behavioral problems. The patient is nervous/anxious.       Objective:    Physical Exam Vitals reviewed.  Constitutional:      General: She is not in acute distress.    Appearance: She is not diaphoretic.  HENT:     Head: Normocephalic and atraumatic.     Nose: Nose normal.     Mouth/Throat:     Mouth: Mucous membranes are moist.  Eyes:     General: No scleral icterus.     Extraocular Movements: Extraocular movements intact.  Cardiovascular:     Rate and Rhythm: Normal rate and regular rhythm.     Pulses: Normal pulses.     Heart sounds: Murmur (Systolic over left upper sternal border) heard.  Pulmonary:     Breath sounds: Normal breath sounds. No wheezing or rales.  Abdominal:     General: There is distension.     Palpations: Abdomen is soft.     Hernia: A hernia (Umbilical) is present.  Musculoskeletal:     Right shoulder: No swelling. Decreased range of motion.     Cervical back: Neck supple. No tenderness.     Right lower leg: No edema.     Left lower leg: No edema.  Skin:    General: Skin is warm.     Findings: No rash.  Neurological:     General: No focal deficit present.     Mental Status: She is alert and oriented to person, place, and time.  Psychiatric:        Mood and Affect: Mood normal. Mood is not depressed.        Behavior: Behavior normal.     BP 136/62 (BP Location: Left Arm)   Pulse 63   Ht 5\' 3"  (1.6 m)   Wt 132 lb 6.4 oz (60.1 kg)   SpO2 95%   BMI 23.45 kg/m  Wt Readings from Last 3 Encounters:  05/23/23 132 lb 6.4 oz (60.1 kg)  05/07/23 130 lb (59 kg)  02/19/23 126 lb 12.8 oz (57.5 kg)    Lab Results  Component Value Date   TSH 2.760 04/29/2023   Lab Results  Component Value Date   WBC 3.6 04/29/2023   HGB 9.9 (L) 04/29/2023   HCT 30.3 (L) 04/29/2023   MCV 79 04/29/2023   PLT 102 (L) 04/29/2023   Lab Results  Component Value Date   NA 142 04/29/2023   K 4.6 04/29/2023   CO2 24 04/29/2023   GLUCOSE 123 (H) 04/29/2023   BUN 16 04/29/2023   CREATININE 0.97 04/29/2023   BILITOT 0.6 04/29/2023   ALKPHOS 85 04/29/2023   AST 23 04/29/2023   ALT 9 04/29/2023   PROT 7.8 04/29/2023   ALBUMIN 3.6 (L) 04/29/2023   CALCIUM 8.8 04/29/2023   ANIONGAP 7 11/29/2021   EGFR 63 04/29/2023   GFR 48.42 (L) 04/25/2022   Lab Results  Component Value Date   CHOL 163 04/29/2023   Lab Results  Component Value Date    HDL 45 04/29/2023   Lab Results  Component Value Date   LDLCALC 101 (H) 04/29/2023   Lab Results  Component Value Date   TRIG 94 04/29/2023   Lab Results  Component Value Date   CHOLHDL 3.6 04/29/2023   Lab  Results  Component Value Date   HGBA1C 8.7 (H) 04/29/2023      Assessment & Plan:   Problem List Items Addressed This Visit       Cardiovascular and Mediastinum   Essential hypertension, benign    BP Readings from Last 1 Encounters:  05/23/23 136/62  Well controlled with Coreg and Lasix Has given Lasix 20 mg daily and spironolactone 25 mg daily to help with cirrhosis related ascites, but she has stopped spironolactone due to myalgias - increased Lasix to 40 mg QD for leg swelling and ascites Counseled for compliance with the medications Advised DASH diet and moderate exercise/walking, at least 150 mins/week      Relevant Medications   furosemide (LASIX) 40 MG tablet   rosuvastatin (CRESTOR) 5 MG tablet     Digestive   Cirrhosis of liver with ascites (HCC)    Had paracentesis (05/24) - 2.4 liters of clear fluid removed Has recurrence of ascites, discussed about portacaval shunt versus repeated paracentesis with GI  Recent EGD showed portal hypertensive gastropathy and esophageal varices On B-blocker for ppx Increased dose of Lasix to 40 mg QD      Relevant Medications   furosemide (LASIX) 40 MG tablet     Endocrine   Type 2 diabetes mellitus with other specified complication (HCC)    Lab Results  Component Value Date   HGBA1C 8.7 (H) 04/29/2023  Associated with HTN and HLD On Glipizide 10 mg QD only now Uncontrolled due to inconsistent diet Increased glipizide to 10 mg BID Did not tolerate Metformin in the past Advised to follow diabetic diet On ACEi and statin F/u CMP and lipid panel Diabetic eye exam: Advised to follow up with Ophthalmology for diabetic eye exam      Relevant Medications   glipiZIDE (GLUCOTROL) 10 MG tablet   rosuvastatin  (CRESTOR) 5 MG tablet     Hematopoietic and Hemostatic   THROMBOCYTOPENIA    Has seen heme-onc Likely due to underlying NASH and/or nutritional deficiencies Continue iron supplement for anemia Has easy bruising Check CBC        Other   Mixed hyperlipidemia    On Crestor 5 mg QD QOD Check lipid profile      Relevant Medications   furosemide (LASIX) 40 MG tablet   rosuvastatin (CRESTOR) 5 MG tablet   Major depressive disorder, recurrent episode, moderate (HCC) - Primary    Flowsheet Row Office Visit from 02/19/2023 in [facility]  PHQ-9 Total Score 11     Uncontrolled with Prozac 40 mg QD for now and Seroquel 50 mg qHS Increased dose of Seroquel to 50 mg BID   Did not tolerate Zoloft - drowsiness and diarrhea Cymbalta was ineffective Did not like effect of Lexapro, Effexor and Rexulti Had added Vraylar as she has persistent uncontrolled depression, but was not covered Referred to [facility] [place] therapy      Relevant Medications   QUEtiapine (SEROQUEL) 50 MG tablet   Chronic right shoulder pain    Right shoulder pain with stiffness-could be rotator cuff injury or referred pain from cervical spine Toradol 30 mg IM today Has persistent pain, will get x-ray of right shoulder      Relevant Orders   DG Shoulder Right   Meds ordered this encounter  Medications   furosemide (LASIX) 40 MG tablet    Sig: Take 1 tablet (40 mg total) by mouth daily.    Dispense:  90 tablet    Refill:  1  glipiZIDE (GLUCOTROL) 10 MG tablet    Sig: Take 1 tablet (10 mg total) by mouth 2 (two) times daily before a meal.    Dispense:  180 tablet    Refill:  1   rosuvastatin (CRESTOR) 5 MG tablet    Sig: Take 1 tablet (5 mg total) by mouth daily. Takes one on Monday, Wed., and Friday    Dispense:  45 tablet    Refill:  1   QUEtiapine (SEROQUEL) 50 MG tablet    Sig: Take 1 tablet (50 mg total) by mouth 2 (two) times daily.    Dispense:  180 tablet    Refill:  1   ketorolac  (TORADOL) injection 30 mg    Follow-up: Return in about 4 months (around 09/22/2023) for MDD and DM.    Anabel Halon, MD

## 2023-05-23 NOTE — Assessment & Plan Note (Signed)
BP Readings from Last 1 Encounters:  05/23/23 136/62   Well controlled with Coreg and Lasix Has given Lasix 20 mg daily and spironolactone 25 mg daily to help with cirrhosis related ascites, but she has stopped spironolactone due to myalgias - increased Lasix to 40 mg QD for leg swelling and ascites Counseled for compliance with the medications Advised DASH diet and moderate exercise/walking, at least 150 mins/week

## 2023-05-30 ENCOUNTER — Encounter (INDEPENDENT_AMBULATORY_CARE_PROVIDER_SITE_OTHER): Payer: Self-pay

## 2023-06-14 ENCOUNTER — Other Ambulatory Visit: Payer: Self-pay | Admitting: Internal Medicine

## 2023-06-14 DIAGNOSIS — F331 Major depressive disorder, recurrent, moderate: Secondary | ICD-10-CM

## 2023-08-15 ENCOUNTER — Other Ambulatory Visit: Payer: Self-pay | Admitting: Internal Medicine

## 2023-08-15 DIAGNOSIS — F331 Major depressive disorder, recurrent, moderate: Secondary | ICD-10-CM

## 2023-08-18 ENCOUNTER — Other Ambulatory Visit: Payer: Self-pay | Admitting: Internal Medicine

## 2023-08-18 DIAGNOSIS — E782 Mixed hyperlipidemia: Secondary | ICD-10-CM

## 2023-08-18 DIAGNOSIS — E1169 Type 2 diabetes mellitus with other specified complication: Secondary | ICD-10-CM

## 2023-09-23 ENCOUNTER — Encounter: Payer: Self-pay | Admitting: Internal Medicine

## 2023-09-23 ENCOUNTER — Ambulatory Visit: Payer: Medicare HMO | Admitting: Internal Medicine

## 2023-09-23 VITALS — BP 122/72 | HR 58 | Ht 63.0 in | Wt 133.6 lb

## 2023-09-23 DIAGNOSIS — F331 Major depressive disorder, recurrent, moderate: Secondary | ICD-10-CM | POA: Diagnosis not present

## 2023-09-23 DIAGNOSIS — G8929 Other chronic pain: Secondary | ICD-10-CM

## 2023-09-23 DIAGNOSIS — Z23 Encounter for immunization: Secondary | ICD-10-CM | POA: Diagnosis not present

## 2023-09-23 DIAGNOSIS — E1169 Type 2 diabetes mellitus with other specified complication: Secondary | ICD-10-CM | POA: Diagnosis not present

## 2023-09-23 DIAGNOSIS — R188 Other ascites: Secondary | ICD-10-CM

## 2023-09-23 DIAGNOSIS — K7460 Unspecified cirrhosis of liver: Secondary | ICD-10-CM | POA: Diagnosis not present

## 2023-09-23 DIAGNOSIS — M25511 Pain in right shoulder: Secondary | ICD-10-CM

## 2023-09-23 DIAGNOSIS — I10 Essential (primary) hypertension: Secondary | ICD-10-CM | POA: Diagnosis not present

## 2023-09-23 MED ORDER — FLUOXETINE HCL 40 MG PO CAPS: 40.0000 mg | ORAL_CAPSULE | Freq: Every day | ORAL | 1 refills | Status: DC

## 2023-09-23 MED ORDER — FLUOXETINE HCL 20 MG PO CAPS
20.0000 mg | ORAL_CAPSULE | Freq: Every day | ORAL | 1 refills | Status: DC
Start: 2023-09-23 — End: 2024-01-09

## 2023-09-23 NOTE — Assessment & Plan Note (Signed)
Lab Results  Component Value Date   HGBA1C 8.7 (H) 04/29/2023   Associated with HTN and HLD On Glipizide 10 mg BID Uncontrolled due to inconsistent diet Did not tolerate Metformin in the past Advised to follow diabetic diet On ACEi and statin F/u CMP and lipid panel Diabetic eye exam: Advised to follow up with Ophthalmology for diabetic eye exam

## 2023-09-23 NOTE — Progress Notes (Unsigned)
Established Patient Office Visit  Subjective:  Patient ID: [name] [name], female    DOB: Dec 03, 1951  Age: 72 y.o. MRN: [id number]  CC:  Chief Complaint  Patient presents with   Follow-up    Follow up    HPI [name] [name] is a 72 y.o. female with past medical history of NASH related liver cirrhosis, portal hypertensive gastropathy, esophageal varices, HTN, AS, DM, HLD, MGUS, iron deficiency anemia, depression and anxiety who presents for f/u of her chronic medical conditions.  DM: Her HbA1c had increased to 8.7 in 05/24.  She is taking glipizide 10 mg BID, but admits that she has not been following low-carb diet.  Denies any polyuria or polyphagia currently.  HTN: Her BP is well controlled today. She has been placed on Lasix and spironolactone considering her history of liver cirrhosis and ascites, but does not take spironolactone due to myalgias.  She denies any headache, dizziness, chest pain, dyspnea or palpitations.  NASH related liver cirrhosis: She has recurrent ascites, had paracentesis on 05/19 -about 2.4 L fluid was removed.  Of note, she has not been taking spironolactone and takes only 40 mg of Lasix currently.  MDD: She is currently taking Prozac 40 mg daily for it.  She was placed on Seroquel 50 mg qHS for resistant depression and insomnia.  She still complains of apathy, lack of interest in routine activities and lack of appetite.  She still complains of anhedonia and agitation, but denies crying spells.  She also has spells of anxiety due to arguments with her husband. She denies SI or HI currently.  She has tried Zoloft, Lexapro, Effexor and Rexulti before.  She complains of right shoulder pain, chronic, worse in the morning, dull and intermittent since 12/03/22.  She denies any recent injury.  Denies any numbness or tingling of the UE.  She was given baclofen in the last visit, but she has not taken it regularly.  She has difficulty raising her shoulder on the side.    Past Medical History:  Diagnosis Date   Allergy    Anemia, unspecified    Anxiety    Arthritis    Chronic diarrhea    Cirrhosis (HCC)    Depression    Depression    Elevated cholesterol    Enterocolitis due to Clostridium difficile, not specified as recurrent    Gastro-esophageal reflux disease with esophagitis    Helicobacter pylori gastritis 07/10/2017   Dx EGD -  1) Omeprazole 20 mg 2 times a day x 14 d 2) Pepto Bismol 2 tabs (262 mg each) 4 times a day x 14 d 3) Metronidazole 250 mg 4 times a day x 14 d 4) doxycycline 100 mg 2 times a day x 14 d  After 14 d stop omeprazole also  In 4 weeks after treatment completed do H. Pylori stool antigen - dx H. Pylori gastritis    Hx of adenomatous colonic polyps 10/05/2015   Hyperlipemia 02/21/2009   NUC STRESS TEST-NORMAL- EF 74%   Hypertension    Iron deficiency anemia    Irritable bowel syndrome    Localized edema    Major depressive disorder, single episode, unspecified    MVP (mitral valve prolapse)    Rectocele    Thrombocytopenia (HCC)    Type 2 diabetes mellitus with hyperglycemia [facility])     Past Surgical History:  Procedure Laterality Date   ABDOMINAL HYSTERECTOMY     BILATERAL SALPINGOOPHORECTOMY     carpel tunnel Bilateral 12/17/2009  COLONOSCOPY     ESOPHAGOGASTRODUODENOSCOPY     IR PARACENTESIS  05/03/2022   IR RADIOLOGIST EVAL & MGMT  11/12/2022   TUBAL LIGATION      Family History  Problem Relation Age of Onset   Anxiety disorder Mother    ADD / ADHD Brother    Alcohol abuse Brother        x 3   Drug abuse Brother    Anxiety disorder Maternal Aunt    Anxiety disorder Maternal Grandmother    Bipolar disorder Other    Emphysema Father    Diabetes Sister    Hyperlipidemia Sister    Cirrhosis Sister    Hyperlipidemia Sister    Diabetes Sister    Diabetes Maternal Uncle    Dementia Neg Hx    OCD Neg Hx    Paranoid behavior Neg Hx    Physical abuse Neg Hx    Schizophrenia Neg Hx    Seizures Neg Hx     Sexual abuse Neg Hx    Colon cancer Neg Hx    Colon polyps Neg Hx    Pancreatic cancer Neg Hx    Esophageal cancer Neg Hx    Stomach cancer Neg Hx    Kidney disease Neg Hx    Liver disease Neg Hx    Rectal cancer Neg Hx     Social History   Socioeconomic History   Marital status: Married    Spouse name: Harvie Heck   Number of children: 2   Years of education: Not on file   Highest education level: Not on file  Occupational History   Not on file  Tobacco Use   Smoking status: Former    Current packs/day: 0.00    Types: Cigarettes    Start date: 09/04/1967    Quit date: 09/03/1997    Years since quitting: 26.0    Passive exposure: Past   Smokeless tobacco: Former    Types: Snuff    Quit date: 05/2022   Tobacco comments:    call when ready to quit    06/14/2022 patient stated she quit 5 or so weeks ago  Vaping Use   Vaping status: Never Used  Substance and Sexual Activity   Alcohol use: No    Alcohol/week: 0.0 standard drinks of alcohol   Drug use: No   Sexual activity: Not on file  Other Topics Concern   Not on file  Social History Narrative   Married 2 sons 1 lives in [place] 1 lives in [place]   She is a housewife   No alcohol tobacco or drug use at this time   Former smoker quit in 1998   Social Determinants of Health   Financial Resource Strain: Low Risk  (10/30/2022)   Overall Financial Resource Strain (CARDIA)    Difficulty of Paying Living Expenses: Not hard at all  Food Insecurity: No Food Insecurity (10/30/2022)   Hunger Vital Sign    Worried About Running Out of Food in the Last Year: Never true    Ran Out of Food in the Last Year: Never true  Transportation Needs: No Transportation Needs (08/19/2021)   PRAPARE - Administrator, Civil Service (Medical): No    Lack of Transportation (Non-Medical): No  Physical Activity: Sufficiently Active (08/19/2021)   Exercise Vital Sign    Days of Exercise per Week: 5 days    Minutes of Exercise  per Session: 30 min  Stress: No Stress Concern Present (10/30/2022)  [employer] of Occupational Health - Occupational Stress Questionnaire    Feeling of Stress : Not at all  Social Connections: Moderately Integrated (10/30/2022)   Social Connection and Isolation Panel [NHANES]    Frequency of Communication with Friends and Family: More than three times a week    Frequency of Social Gatherings with Friends and Family: Once a week    Attends Religious Services: 1 to 4 times per year    Active Member of [employer] or Organizations: No    Attends Engineer, structural: Not on file    Marital Status: Married  Catering manager Violence: Not At Risk (08/19/2021)   Humiliation, Afraid, Rape, and Kick questionnaire    Fear of Current or Ex-Partner: No    Emotionally Abused: No    Physically Abused: No    Sexually Abused: No    Outpatient Medications Prior to Visit  Medication Sig Dispense Refill   baclofen (LIORESAL) 10 MG tablet TAKE 1 TABLET BY MOUTH TWICE A DAY AS NEEDED FOR MUSCLE SPASMS 180 tablet 1   BIOTIN PO Take 500 mg by mouth daily.     carvedilol (COREG) 25 MG tablet TAKE 1 TABLET BY MOUTH TWICE A DAY 180 tablet 3   ferrous sulfate 325 (65 FE) MG EC tablet Take 325 mg by mouth 2 (two) times daily. One daily     furosemide (LASIX) 40 MG tablet Take 1 tablet (40 mg total) by mouth daily. 90 tablet 1   glipiZIDE (GLUCOTROL) 10 MG tablet TAKE 1 TABLET (10 MG TOTAL) BY MOUTH TWICE A DAY BEFORE A MEAL 180 tablet 1   ondansetron (ZOFRAN) 4 MG tablet Take 1 tablet (4 mg total) by mouth every 8 (eight) hours as needed for nausea or vomiting. 20 tablet 0   QUEtiapine (SEROQUEL) 50 MG tablet TAKE 1 TABLET BY MOUTH EVERYDAY AT BEDTIME 90 tablet 1   rosuvastatin (CRESTOR) 5 MG tablet TAKE 1 TABLET (5 MG TOTAL) BY MOUTH DAILY. TAKES ONE ON MONDAY, WED., AND FRIDAY 45 tablet 1   spironolactone (ALDACTONE) 25 MG tablet Take 1 tablet (25 mg total) by mouth daily. 90 tablet 3    triamcinolone cream (KENALOG) 0.1 % APPLY TO AFFECTED AREA TWICE A DAY 30 g 0   FLUoxetine (PROZAC) 40 MG capsule TAKE 1 CAPSULE (40 MG TOTAL) BY MOUTH DAILY. 90 capsule 1   hydrALAZINE (APRESOLINE) 25 MG tablet Take 1 tablet (25 mg total) by mouth 3 (three) times daily as needed (For Systolic over 180). 90 tablet 0   0.9 %  sodium chloride infusion      No facility-administered medications prior to visit.    Allergies  Allergen Reactions   Codeine Other (See Comments)    Took it years ago and doesn't remember the reaction   Spironolactone Other (See Comments)    Reports intolerance , states unable to take the medication    ROS Review of Systems  Constitutional:  Positive for fatigue. Negative for chills and fever.  HENT:  Negative for congestion, sinus pressure, sinus pain and sore throat.   Eyes:  Negative for pain and discharge.  Respiratory:  Negative for cough and shortness of breath.   Cardiovascular:  Negative for chest pain and palpitations.  Gastrointestinal:  Positive for abdominal distention. Negative for diarrhea, nausea and vomiting.  Endocrine: Negative for polydipsia and polyuria.  Genitourinary:  Negative for dysuria and hematuria.  Musculoskeletal:  Positive for arthralgias. Negative for neck pain and neck stiffness.  Skin:  Negative  for rash.  Neurological:  Negative for dizziness and weakness.  Psychiatric/Behavioral:  Positive for dysphoric mood and sleep disturbance. Negative for agitation and behavioral problems. The patient is nervous/anxious.       Objective:    Physical Exam Vitals reviewed.  Constitutional:      General: She is not in acute distress.    Appearance: She is not diaphoretic.  HENT:     Head: Normocephalic and atraumatic.     Nose: Nose normal.     Mouth/Throat:     Mouth: Mucous membranes are moist.  Eyes:     General: No scleral icterus.    Extraocular Movements: Extraocular movements intact.  Cardiovascular:     Rate and  Rhythm: Normal rate and regular rhythm.     Pulses: Normal pulses.     Heart sounds: Murmur (Systolic over left upper sternal border) heard.  Pulmonary:     Breath sounds: Normal breath sounds. No wheezing or rales.  Abdominal:     General: There is distension.     Palpations: Abdomen is soft.     Hernia: A hernia (Umbilical) is present.  Musculoskeletal:     Right shoulder: No swelling. Decreased range of motion.     Cervical back: Neck supple. No tenderness.     Right lower leg: No edema.     Left lower leg: No edema.  Skin:    General: Skin is warm.     Findings: No rash.  Neurological:     General: No focal deficit present.     Mental Status: She is alert and oriented to person, place, and time.  Psychiatric:        Mood and Affect: Mood is depressed.        Behavior: Behavior is slowed.     BP 122/72 (BP Location: Right Arm, Patient Position: Sitting, Cuff Size: Normal)   Pulse (!) 58   Ht 5\' 3"  (1.6 m)   Wt 133 lb 9.6 oz (60.6 kg)   SpO2 98%   BMI 23.67 kg/m  Wt Readings from Last 3 Encounters:  09/23/23 133 lb 9.6 oz (60.6 kg)  05/23/23 132 lb 6.4 oz (60.1 kg)  05/07/23 130 lb (59 kg)    Lab Results  Component Value Date   TSH 2.760 04/29/2023   Lab Results  Component Value Date   WBC 4.1 09/23/2023   HGB 10.5 (L) 09/23/2023   HCT 32.9 (L) 09/23/2023   MCV 80 09/23/2023   PLT 101 (L) 09/23/2023   Lab Results  Component Value Date   NA 139 09/23/2023   K 4.7 09/23/2023   CO2 24 09/23/2023   GLUCOSE 173 (H) 09/23/2023   BUN 20 09/23/2023   CREATININE 1.22 (H) 09/23/2023   BILITOT 0.6 09/23/2023   ALKPHOS 75 09/23/2023   AST 20 09/23/2023   ALT 7 09/23/2023   PROT 7.8 09/23/2023   ALBUMIN 3.8 (L) 09/23/2023   CALCIUM 9.3 09/23/2023   ANIONGAP 7 11/29/2021   EGFR 48 (L) 09/23/2023   GFR 48.42 (L) 04/25/2022   Lab Results  Component Value Date   CHOL 163 04/29/2023   Lab Results  Component Value Date   HDL 45 04/29/2023   Lab Results   Component Value Date   LDLCALC 101 (H) 04/29/2023   Lab Results  Component Value Date   TRIG 94 04/29/2023   Lab Results  Component Value Date   CHOLHDL 3.6 04/29/2023   Lab Results  Component Value Date  HGBA1C 11.7 (H) 09/23/2023      Assessment & Plan:   Problem List Items Addressed This Visit       Cardiovascular and Mediastinum   Essential hypertension    BP Readings from Last 1 Encounters:  09/23/23 122/72   Well controlled with Coreg and Lasix Had given Lasix 20 mg daily and spironolactone 25 mg daily to help with cirrhosis related ascites, but she has stopped spironolactone due to myalgias - increased Lasix to 40 mg QD for leg swelling and ascites Counseled for compliance with the medications Advised DASH diet and moderate exercise/walking, at least 150 mins/week        Digestive   Cirrhosis of liver with ascites (HCC)    Had paracentesis (05/24) - 2.4 liters of clear fluid removed Has recurrence of ascites, discussed about portacaval shunt versus repeated paracentesis with GI  Recent EGD showed portal hypertensive gastropathy and esophageal varices On B-blocker for ppx On Lasix to 40 mg QD      Relevant Orders   CBC with Differential/Platelet (Completed)     Endocrine   Type 2 diabetes mellitus with other specified complication (HCC) - Primary    Lab Results  Component Value Date   HGBA1C 8.7 (H) 04/29/2023   Associated with HTN and HLD On Glipizide 10 mg BID Uncontrolled due to inconsistent diet Did not tolerate Metformin in the past Advised to follow diabetic diet On ACEi and statin F/u CMP and HbA1c Diabetic eye exam: Advised to follow up with Ophthalmology for diabetic eye exam      Relevant Orders   CMP14+EGFR (Completed)   Hemoglobin A1c (Completed)   Urine Microalbumin w/creat. ratio (Completed)     Other   Major depressive disorder, recurrent episode, moderate (HCC)    Flowsheet Row Office Visit from 02/19/2023 in [facility]  PHQ-9 Total Score 11      Her major concern is that her husband remains busy with his cars and does not spend time with her.  No current concern of physical or verbal abuse.  Had lengthy discussion to find alternative activities, she denies BH therapy as she wants her husband to get therapy  Uncontrolled with Prozac 40 mg QD for now and Seroquel 50 mg qHS Increased dose of Prozac to 60 mg QD   Did not tolerate Zoloft - drowsiness and diarrhea Cymbalta was ineffective Did not like effect of Lexapro, Effexor and Rexulti Had added Vraylar as she has persistent uncontrolled depression, but was not covered Referred to [facility] therapy, but did not follow up      Relevant Medications   FLUoxetine (PROZAC) 20 MG capsule   FLUoxetine (PROZAC) 40 MG capsule   Chronic right shoulder pain    Right shoulder pain with stiffness-could be rotator cuff injury or referred pain from cervical spine Has persistent pain, x-ray of right shoulder was negative for any acute abnormality Have offered Orthopedic surgery evaluation, but she prefers to wait for now      Relevant Medications   FLUoxetine (PROZAC) 20 MG capsule   FLUoxetine (PROZAC) 40 MG capsule   Other Visit Diagnoses     Encounter for immunization       Relevant Orders   Flu Vaccine Trivalent High Dose (Fluad) (Completed)       Meds ordered this encounter  Medications   FLUoxetine (PROZAC) 20 MG capsule    Sig: Take 1 capsule (20 mg total) by mouth daily. Take in addition to 40 mg to  total 60 mg once daily.    Dispense:  90 capsule    Refill:  1   FLUoxetine (PROZAC) 40 MG capsule    Sig: Take 1 capsule (40 mg total) by mouth daily. Take in addition to 20 mg to total 60 mg once daily.    Dispense:  90 capsule    Refill:  1    Follow-up: Return in about 4 months (around 01/24/2024) for Annual physical.    Anabel Halon, MD

## 2023-09-23 NOTE — Assessment & Plan Note (Signed)
BP Readings from Last 1 Encounters:  09/23/23 122/72   Well controlled with Coreg and Lasix Has given Lasix 20 mg daily and spironolactone 25 mg daily to help with cirrhosis related ascites, but she has stopped spironolactone due to myalgias - increased Lasix to 40 mg QD for leg swelling and ascites Counseled for compliance with the medications Advised DASH diet and moderate exercise/walking, at least 150 mins/week

## 2023-09-23 NOTE — Patient Instructions (Signed)
Please start taking Prozac 20 mg in addition to 40 mg.  Please continue to take medications as prescribed.  Please continue to follow low carb diet and ambulate as tolerated.

## 2023-09-24 LAB — CBC WITH DIFFERENTIAL/PLATELET
Basophils Absolute: 0.1 10*3/uL (ref 0.0–0.2)
Basos: 1 %
EOS (ABSOLUTE): 0.2 10*3/uL (ref 0.0–0.4)
Eos: 4 %
Hematocrit: 32.9 % — ABNORMAL LOW (ref 34.0–46.6)
Hemoglobin: 10.5 g/dL — ABNORMAL LOW (ref 11.1–15.9)
Immature Grans (Abs): 0 10*3/uL (ref 0.0–0.1)
Immature Granulocytes: 1 %
Lymphocytes Absolute: 0.7 10*3/uL (ref 0.7–3.1)
Lymphs: 18 %
MCH: 25.5 pg — ABNORMAL LOW (ref 26.6–33.0)
MCHC: 31.9 g/dL (ref 31.5–35.7)
MCV: 80 fL (ref 79–97)
Monocytes Absolute: 0.4 10*3/uL (ref 0.1–0.9)
Monocytes: 9 %
Neutrophils Absolute: 2.8 10*3/uL (ref 1.4–7.0)
Neutrophils: 67 %
Platelets: 101 10*3/uL — ABNORMAL LOW (ref 150–450)
RBC: 4.11 x10E6/uL (ref 3.77–5.28)
RDW: 14.8 % (ref 11.7–15.4)
WBC: 4.1 10*3/uL (ref 3.4–10.8)

## 2023-09-24 LAB — CMP14+EGFR
ALT: 7 [IU]/L (ref 0–32)
AST: 20 [IU]/L (ref 0–40)
Albumin: 3.8 g/dL — ABNORMAL LOW (ref 3.9–4.9)
Alkaline Phosphatase: 75 [IU]/L (ref 44–121)
BUN/Creatinine Ratio: 16 (ref 12–28)
BUN: 20 mg/dL (ref 8–27)
Bilirubin Total: 0.6 mg/dL (ref 0.0–1.2)
CO2: 24 mmol/L (ref 20–29)
Calcium: 9.3 mg/dL (ref 8.7–10.3)
Chloride: 103 mmol/L (ref 96–106)
Creatinine, Ser: 1.22 mg/dL — ABNORMAL HIGH (ref 0.57–1.00)
Globulin, Total: 4 g/dL (ref 1.5–4.5)
Glucose: 173 mg/dL — ABNORMAL HIGH (ref 70–99)
Potassium: 4.7 mmol/L (ref 3.5–5.2)
Sodium: 139 mmol/L (ref 134–144)
Total Protein: 7.8 g/dL (ref 6.0–8.5)
eGFR: 48 mL/min/{1.73_m2} — ABNORMAL LOW (ref >59)

## 2023-09-24 LAB — HEMOGLOBIN A1C
Est. average glucose Bld gHb Est-mCnc: 289 mg/dL
Hgb A1c MFr Bld: 11.7 % — ABNORMAL HIGH (ref 4.8–5.6)

## 2023-09-25 LAB — MICROALBUMIN / CREATININE URINE RATIO
Creatinine, Urine: 214.1 mg/dL
Microalb/Creat Ratio: 115 mg/g{creat} — ABNORMAL HIGH (ref 0–29)
Microalbumin, Urine: 245.3 ug/mL

## 2023-09-26 NOTE — Assessment & Plan Note (Signed)
Flowsheet Row Office Visit from 02/19/2023 in [facility]  PHQ-9 Total Score 11      Her major concern is that her husband remains busy with his cars and does not spend time with her.  No current concern of physical or verbal abuse.  Had lengthy discussion to find alternative activities, she denies BH therapy as she wants her husband to get therapy  Uncontrolled with Prozac 40 mg QD for now and Seroquel 50 mg qHS Increased dose of Prozac to 60 mg QD   Did not tolerate Zoloft - drowsiness and diarrhea Cymbalta was ineffective Did not like effect of Lexapro, Effexor and Rexulti Had added Vraylar as she has persistent uncontrolled depression, but was not covered Referred to [facility] therapy, but did not follow up

## 2023-09-26 NOTE — Assessment & Plan Note (Signed)
Had paracentesis (05/24) - 2.4 liters of clear fluid removed Has recurrence of ascites, discussed about portacaval shunt versus repeated paracentesis with GI  Recent EGD showed portal hypertensive gastropathy and esophageal varices On B-blocker for ppx On Lasix to 40 mg QD

## 2023-09-26 NOTE — Assessment & Plan Note (Signed)
Right shoulder pain with stiffness-could be rotator cuff injury or referred pain from cervical spine Has persistent pain, x-ray of right shoulder was negative for any acute abnormality Have offered Orthopedic surgery evaluation, but she prefers to wait for now

## 2023-10-03 ENCOUNTER — Ambulatory Visit: Payer: Medicare HMO | Admitting: Internal Medicine

## 2023-10-09 ENCOUNTER — Ambulatory Visit: Payer: Medicare HMO | Admitting: Internal Medicine

## 2023-10-09 ENCOUNTER — Encounter: Payer: Self-pay | Admitting: Internal Medicine

## 2023-10-09 VITALS — BP 132/66 | HR 56 | Ht 63.0 in | Wt 133.8 lb

## 2023-10-09 DIAGNOSIS — I10 Essential (primary) hypertension: Secondary | ICD-10-CM | POA: Diagnosis not present

## 2023-10-09 DIAGNOSIS — E1169 Type 2 diabetes mellitus with other specified complication: Secondary | ICD-10-CM | POA: Diagnosis not present

## 2023-10-09 DIAGNOSIS — F331 Major depressive disorder, recurrent, moderate: Secondary | ICD-10-CM

## 2023-10-09 DIAGNOSIS — Z7984 Long term (current) use of oral hypoglycemic drugs: Secondary | ICD-10-CM

## 2023-10-09 MED ORDER — BD PEN NEEDLE NANO U/F 32G X 4 MM MISC
1 refills | Status: DC
Start: 2023-10-09 — End: 2023-10-21

## 2023-10-09 MED ORDER — GVOKE HYPOPEN 2-PACK 1 MG/0.2ML ~~LOC~~ SOAJ
1.0000 mg | SUBCUTANEOUS | 5 refills | Status: AC | PRN
Start: 2023-10-09 — End: ?

## 2023-10-09 MED ORDER — TOUJEO MAX SOLOSTAR 300 UNIT/ML ~~LOC~~ SOPN
20.0000 [IU] | PEN_INJECTOR | Freq: Every evening | SUBCUTANEOUS | 0 refills | Status: DC
Start: 2023-10-09 — End: 2024-02-21

## 2023-10-09 MED ORDER — BLOOD GLUCOSE TEST VI STRP
1.0000 | ORAL_STRIP | Freq: Three times a day (TID) | 3 refills | Status: DC
Start: 2023-10-09 — End: 2024-10-05

## 2023-10-09 MED ORDER — BLOOD GLUCOSE MONITORING SUPPL DEVI
1.0000 | Freq: Three times a day (TID) | 0 refills | Status: DC
Start: 2023-10-09 — End: 2024-11-10

## 2023-10-09 MED ORDER — LANCET DEVICE MISC
1.0000 | Freq: Three times a day (TID) | 2 refills | Status: AC
Start: 2023-10-09 — End: 2023-11-08

## 2023-10-09 NOTE — Progress Notes (Signed)
Established Patient Office Visit  Subjective:  Patient ID: [name] [name], female    DOB: [birth year]  Age: 72 y.o. MRN: [id number]  CC:  Chief Complaint  Patient presents with   Diabetes    Follow up     HPI [name] [name] is a 72 y.o. female with past medical history of NASH related liver cirrhosis, portal hypertensive gastropathy, esophageal varices, HTN, AS, DM, HLD, MGUS, iron deficiency anemia, depression and anxiety who presents for f/u of her chronic medical conditions.  DM: Her HbA1c had increased to 11.7 from 8.7 in 05/24.  She is taking glipizide 10 mg BID, but admits that she has not been following low-carb diet.  She did did not tolerate metformin and Januvia in the past.  She used to take Toujeo 50 units nightly in 2019 when she used to see Dr. Fransico Him. Denies any polyuria or polyphagia currently.  HTN: Her BP is well controlled today. She has been placed on Lasix  considering her history of liver cirrhosis and ascites, but does not take spironolactone due to myalgias.  She denies any headache, dizziness, chest pain, dyspnea or palpitations.  NASH related liver cirrhosis: She has recurrent ascites, had paracentesis on 05/19 -about 2.4 L fluid was removed.  Of note, she has not been taking spironolactone and takes only 40 mg of Lasix currently.  MDD: She is currently taking Prozac 60 mg daily for it, dose was recently increased from 40 mg.  She was placed on Seroquel 50 mg qHS for resistant depression and insomnia.  She still complains of apathy, lack of interest in routine activities and lack of appetite.  She still complains of anhedonia and agitation, but denies crying spells.  She also has spells of anxiety due to arguments with her husband. She denies SI or HI currently.  She has tried Zoloft, Lexapro, Effexor and Rexulti before.     Past Medical History:  Diagnosis Date   Allergy    Anemia, unspecified    Anxiety    Arthritis    Chronic diarrhea    Cirrhosis (HCC)     Depression    Depression    Elevated cholesterol    Enterocolitis due to Clostridium difficile, not specified as recurrent    Gastro-esophageal reflux disease with esophagitis    Helicobacter pylori gastritis 07/10/2017   Dx EGD -  1) Omeprazole 20 mg 2 times a day x 14 d 2) Pepto Bismol 2 tabs (262 mg each) 4 times a day x 14 d 3) Metronidazole 250 mg 4 times a day x 14 d 4) doxycycline 100 mg 2 times a day x 14 d  After 14 d stop omeprazole also  In 4 weeks after treatment completed do H. Pylori stool antigen - dx H. Pylori gastritis    Hx of adenomatous colonic polyps 10/05/2015   Hyperlipemia 02/21/2009   NUC STRESS TEST-NORMAL- EF 74%   Hypertension    Iron deficiency anemia    Irritable bowel syndrome    Localized edema    Major depressive disorder, single episode, unspecified    MVP (mitral valve prolapse)    Rectocele    Thrombocytopenia (HCC)    Type 2 diabetes mellitus with hyperglycemia [facility])     Past Surgical History:  Procedure Laterality Date   ABDOMINAL HYSTERECTOMY     BILATERAL SALPINGOOPHORECTOMY     carpel tunnel Bilateral 12/17/2009   COLONOSCOPY     ESOPHAGOGASTRODUODENOSCOPY     IR PARACENTESIS  05/03/2022  IR RADIOLOGIST EVAL & MGMT  11/12/2022   TUBAL LIGATION      Family History  Problem Relation Age of Onset   Anxiety disorder Mother    ADD / ADHD Brother    Alcohol abuse Brother        x 3   Drug abuse Brother    Anxiety disorder Maternal Aunt    Anxiety disorder Maternal Grandmother    Bipolar disorder Other    Emphysema Father    Diabetes Sister    Hyperlipidemia Sister    Cirrhosis Sister    Hyperlipidemia Sister    Diabetes Sister    Diabetes Maternal Uncle    Dementia Neg Hx    OCD Neg Hx    Paranoid behavior Neg Hx    Physical abuse Neg Hx    Schizophrenia Neg Hx    Seizures Neg Hx    Sexual abuse Neg Hx    Colon cancer Neg Hx    Colon polyps Neg Hx    Pancreatic cancer Neg Hx    Esophageal cancer Neg Hx    Stomach cancer  Neg Hx    Kidney disease Neg Hx    Liver disease Neg Hx    Rectal cancer Neg Hx     Social History   Socioeconomic History   Marital status: Married    Spouse name: Harvie Heck   Number of children: 2   Years of education: Not on file   Highest education level: Not on file  Occupational History   Not on file  Tobacco Use   Smoking status: Former    Current packs/day: 0.00    Types: Cigarettes    Start date: 09/04/1967    Quit date: 09/03/1997    Years since quitting: 26.1    Passive exposure: Past   Smokeless tobacco: Former    Types: Snuff    Quit date: 05/2022   Tobacco comments:    call when ready to quit    06/14/2022 patient stated she quit 5 or so weeks ago  Vaping Use   Vaping status: Never Used  Substance and Sexual Activity   Alcohol use: No    Alcohol/week: 0.0 standard drinks of alcohol   Drug use: No   Sexual activity: Not on file  Other Topics Concern   Not on file  Social History Narrative   Married 2 sons 1 lives in [place] 1 lives in [place]   She is a housewife   No alcohol tobacco or drug use at this time   Former smoker quit in 1998   Social Determinants of Health   Financial Resource Strain: Low Risk  (10/30/2022)   Overall Financial Resource Strain (CARDIA)    Difficulty of Paying Living Expenses: Not hard at all  Food Insecurity: No Food Insecurity (10/30/2022)   Hunger Vital Sign    Worried About Running Out of Food in the Last Year: Never true    Ran Out of Food in the Last Year: Never true  Transportation Needs: No Transportation Needs (08/19/2021)   PRAPARE - Administrator, Civil Service (Medical): No    Lack of Transportation (Non-Medical): No  Physical Activity: Sufficiently Active (08/19/2021)   Exercise Vital Sign    Days of Exercise per Week: 5 days    Minutes of Exercise per Session: 30 min  Stress: No Stress Concern Present (10/30/2022)   [employer] of Occupational Health - Occupational Stress  Questionnaire    Feeling of Stress :  Not at all  Social Connections: Moderately Integrated (10/30/2022)   Social Connection and Isolation Panel [NHANES]    Frequency of Communication with Friends and Family: More than three times a week    Frequency of Social Gatherings with Friends and Family: Once a week    Attends Religious Services: 1 to 4 times per year    Active Member of [employer] or Organizations: No    Attends Engineer, structural: Not on file    Marital Status: Married  Catering manager Violence: Not At Risk (08/19/2021)   Humiliation, Afraid, Rape, and Kick questionnaire    Fear of Current or Ex-Partner: No    Emotionally Abused: No    Physically Abused: No    Sexually Abused: No    Outpatient Medications Prior to Visit  Medication Sig Dispense Refill   baclofen (LIORESAL) 10 MG tablet TAKE 1 TABLET BY MOUTH TWICE A DAY AS NEEDED FOR MUSCLE SPASMS 180 tablet 1   BIOTIN PO Take 500 mg by mouth daily.     carvedilol (COREG) 25 MG tablet TAKE 1 TABLET BY MOUTH TWICE A DAY 180 tablet 3   ferrous sulfate 325 (65 FE) MG EC tablet Take 325 mg by mouth 2 (two) times daily. One daily     FLUoxetine (PROZAC) 20 MG capsule Take 1 capsule (20 mg total) by mouth daily. Take in addition to 40 mg to total 60 mg once daily. 90 capsule 1   FLUoxetine (PROZAC) 40 MG capsule Take 1 capsule (40 mg total) by mouth daily. Take in addition to 20 mg to total 60 mg once daily. 90 capsule 1   furosemide (LASIX) 40 MG tablet Take 1 tablet (40 mg total) by mouth daily. 90 tablet 1   glipiZIDE (GLUCOTROL) 10 MG tablet TAKE 1 TABLET (10 MG TOTAL) BY MOUTH TWICE A DAY BEFORE A MEAL 180 tablet 1   ondansetron (ZOFRAN) 4 MG tablet Take 1 tablet (4 mg total) by mouth every 8 (eight) hours as needed for nausea or vomiting. 20 tablet 0   QUEtiapine (SEROQUEL) 50 MG tablet TAKE 1 TABLET BY MOUTH EVERYDAY AT BEDTIME 90 tablet 1   rosuvastatin (CRESTOR) 5 MG tablet TAKE 1 TABLET (5 MG TOTAL) BY MOUTH DAILY.  TAKES ONE ON MONDAY, WED., AND FRIDAY 45 tablet 1   spironolactone (ALDACTONE) 25 MG tablet Take 1 tablet (25 mg total) by mouth daily. 90 tablet 3   triamcinolone cream (KENALOG) 0.1 % APPLY TO AFFECTED AREA TWICE A DAY 30 g 0   No facility-administered medications prior to visit.    Allergies  Allergen Reactions   Codeine Other (See Comments)    Took it years ago and doesn't remember the reaction   Spironolactone Other (See Comments)    Reports intolerance , states unable to take the medication    ROS Review of Systems  Constitutional:  Positive for fatigue. Negative for chills and fever.  HENT:  Negative for congestion, sinus pressure, sinus pain and sore throat.   Eyes:  Negative for pain and discharge.  Respiratory:  Negative for cough and shortness of breath.   Cardiovascular:  Negative for chest pain and palpitations.  Gastrointestinal:  Negative for diarrhea, nausea and vomiting.  Endocrine: Negative for polydipsia and polyuria.  Genitourinary:  Negative for dysuria and hematuria.  Musculoskeletal:  Positive for arthralgias. Negative for neck pain and neck stiffness.  Skin:  Negative for rash.  Neurological:  Negative for dizziness and weakness.  Psychiatric/Behavioral:  Positive for dysphoric  mood and sleep disturbance. Negative for agitation and behavioral problems. The patient is nervous/anxious.       Objective:    Physical Exam Vitals reviewed.  Constitutional:      General: She is not in acute distress.    Appearance: She is not diaphoretic.  HENT:     Head: Normocephalic and atraumatic.     Nose: Nose normal.     Mouth/Throat:     Mouth: Mucous membranes are moist.  Eyes:     General: No scleral icterus.    Extraocular Movements: Extraocular movements intact.  Cardiovascular:     Rate and Rhythm: Normal rate and regular rhythm.     Pulses: Normal pulses.     Heart sounds: Murmur (Systolic over left upper sternal border) heard.  Pulmonary:     Breath  sounds: Normal breath sounds. No wheezing or rales.  Abdominal:     General: There is no distension.     Palpations: Abdomen is soft.  Musculoskeletal:     Right shoulder: No swelling. Decreased range of motion.     Cervical back: Neck supple. No tenderness.     Right lower leg: No edema.     Left lower leg: No edema.  Skin:    General: Skin is warm.     Findings: No rash.  Neurological:     General: No focal deficit present.     Mental Status: She is alert and oriented to person, place, and time.  Psychiatric:        Mood and Affect: Mood is depressed.        Behavior: Behavior is slowed.     BP 132/66 (BP Location: Left Arm)   Pulse (!) 56   Ht 5\' 3"  (1.6 m)   Wt 133 lb 12.8 oz (60.7 kg)   SpO2 96%   BMI 23.70 kg/m  Wt Readings from Last 3 Encounters:  10/09/23 133 lb 12.8 oz (60.7 kg)  09/23/23 133 lb 9.6 oz (60.6 kg)  05/23/23 132 lb 6.4 oz (60.1 kg)    Lab Results  Component Value Date   TSH 2.760 04/29/2023   Lab Results  Component Value Date   WBC 4.1 09/23/2023   HGB 10.5 (L) 09/23/2023   HCT 32.9 (L) 09/23/2023   MCV 80 09/23/2023   PLT 101 (L) 09/23/2023   Lab Results  Component Value Date   NA 139 09/23/2023   K 4.7 09/23/2023   CO2 24 09/23/2023   GLUCOSE 173 (H) 09/23/2023   BUN 20 09/23/2023   CREATININE 1.22 (H) 09/23/2023   BILITOT 0.6 09/23/2023   ALKPHOS 75 09/23/2023   AST 20 09/23/2023   ALT 7 09/23/2023   PROT 7.8 09/23/2023   ALBUMIN 3.8 (L) 09/23/2023   CALCIUM 9.3 09/23/2023   ANIONGAP 7 11/29/2021   EGFR 48 (L) 09/23/2023   GFR 48.42 (L) 04/25/2022   Lab Results  Component Value Date   CHOL 163 04/29/2023   Lab Results  Component Value Date   HDL 45 04/29/2023   Lab Results  Component Value Date   LDLCALC 101 (H) 04/29/2023   Lab Results  Component Value Date   TRIG 94 04/29/2023   Lab Results  Component Value Date   CHOLHDL 3.6 04/29/2023   Lab Results  Component Value Date   HGBA1C 11.7 (H) 09/23/2023       Assessment & Plan:   Problem List Items Addressed This Visit       Cardiovascular and Mediastinum  Essential hypertension    BP Readings from Last 1 Encounters:  10/09/23 132/66   Well controlled with Coreg and Lasix Had given Lasix 20 mg daily and spironolactone 25 mg daily to help with cirrhosis related ascites, but she has stopped spironolactone due to myalgias - increased Lasix to 40 mg QD for leg swelling and ascites Counseled for compliance with the medications Advised DASH diet and moderate exercise/walking, at least 150 mins/week        Endocrine   Type 2 diabetes mellitus with other specified complication (HCC) - Primary    Lab Results  Component Value Date   HGBA1C 11.7 (H) 09/23/2023   Associated with HTN and HLD On Glipizide 10 mg BID Uncontrolled due to inconsistent diet Did not tolerate Metformin and Januvia in the past Considering her history of liver cirrhosis and chronic nausea, would avoid GLP-1 agonist therapy  Started Toujeo 20 units every evening (used to take Toujeo 50 units every evening) Glucometer and supplies prescription sent, strictly advised to check blood glucose regularly and contact if blood glucose more than 300 or less than 70  Gvoke hypopen prescribed for hypoglycemia Advised to follow diabetic diet On ACEi and statin F/u CMP and HbA1c Diabetic eye exam: Advised to follow up with Ophthalmology for diabetic eye exam      Relevant Medications   insulin glargine, 2 Unit Dial, (TOUJEO MAX SOLOSTAR) 300 UNIT/ML Solostar Pen   Insulin Pen Needle (BD PEN NEEDLE NANO U/F) 32G X 4 MM MISC   Blood Glucose Monitoring Suppl DEVI   Glucose Blood (BLOOD GLUCOSE TEST STRIPS) STRP   Lancet Device MISC   Glucagon (GVOKE HYPOPEN 2-PACK) 1 MG/0.2ML SOAJ   Other Relevant Orders   Referral to Nutrition and Diabetes Services     Other   Major depressive disorder, recurrent episode, moderate (HCC)    Flowsheet Row Office Visit from 02/19/2023 in  [facility]  PHQ-9 Total Score 11      Her major concern is that her husband remains busy with his cars and does not spend time with her.  No current concern of physical or verbal abuse.  Had lengthy discussion to find alternative activities, she denies BH therapy as she wants her husband to get therapy  Uncontrolled with Prozac 40 mg QD for now and Seroquel 50 mg qHS Increased dose of Prozac to 60 mg once daily recently   Did not tolerate Zoloft - drowsiness and diarrhea Cymbalta was ineffective Did not like effect of Lexapro, Effexor and Rexulti Had added Vraylar as she has persistent uncontrolled depression, but was not covered Referred to [facility] therapy, but did not follow up        Meds ordered this encounter  Medications   insulin glargine, 2 Unit Dial, (TOUJEO MAX SOLOSTAR) 300 UNIT/ML Solostar Pen    Sig: Inject 20 Units into the skin every evening.    Dispense:  6 mL    Refill:  0   Insulin Pen Needle (BD PEN NEEDLE NANO U/F) 32G X 4 MM MISC    Sig: Use pen needle as instructed for insulin administration.    Dispense:  100 each    Refill:  1   Blood Glucose Monitoring Suppl DEVI    Sig: 1 each by Does not apply route in the morning, at noon, and at bedtime. May substitute to any manufacturer covered by patient's insurance.    Dispense:  1 each    Refill:  0   Glucose  Blood (BLOOD GLUCOSE TEST STRIPS) STRP    Sig: 1 each by In Vitro route in the morning, at noon, and at bedtime. May substitute to any manufacturer covered by patient's insurance.    Dispense:  100 strip    Refill:  3   Lancet Device MISC    Sig: 1 each by Does not apply route in the morning, at noon, and at bedtime. May substitute to any manufacturer covered by patient's insurance.    Dispense:  100 each    Refill:  2   Glucagon (GVOKE HYPOPEN 2-PACK) 1 MG/0.2ML SOAJ    Sig: Inject 1 mg into the skin as needed (For blood glucose < 53).    Dispense:  0.4 mL    Refill:  5    Okay  to dispense generic alternative if Gvoke not covered.    Follow-up: Return in about 4 weeks (around 11/06/2023) for DM.    Anabel Halon, MD

## 2023-10-09 NOTE — Assessment & Plan Note (Addendum)
Flowsheet Row Office Visit from 02/19/2023 in [facility]  PHQ-9 Total Score 11      Her major concern is that her husband remains busy with his cars and does not spend time with her.  No current concern of physical or verbal abuse.  Had lengthy discussion to find alternative activities, she denies BH therapy as she wants her husband to get therapy  Uncontrolled with Prozac 40 mg QD for now and Seroquel 50 mg qHS Increased dose of Prozac to 60 mg once daily recently   Did not tolerate Zoloft - drowsiness and diarrhea Cymbalta was ineffective Did not like effect of Lexapro, Effexor and Rexulti Had added Vraylar as she has persistent uncontrolled depression, but was not covered Referred to [facility] therapy, but did not follow up

## 2023-10-09 NOTE — Patient Instructions (Signed)
Please start taking Toujeo 20 Units in the evening.  Please continue taking Glipizide for now.  Please follow low carb diet and ambulate as tolerated.  Follow up with Nutritionist for diabetic diet counseling.

## 2023-10-09 NOTE — Assessment & Plan Note (Signed)
BP Readings from Last 1 Encounters:  10/09/23 132/66   Well controlled with Coreg and Lasix Had given Lasix 20 mg daily and spironolactone 25 mg daily to help with cirrhosis related ascites, but she has stopped spironolactone due to myalgias - increased Lasix to 40 mg QD for leg swelling and ascites Counseled for compliance with the medications Advised DASH diet and moderate exercise/walking, at least 150 mins/week

## 2023-10-09 NOTE — Assessment & Plan Note (Signed)
Lab Results  Component Value Date   HGBA1C 11.7 (H) 09/23/2023   Associated with HTN and HLD On Glipizide 10 mg BID Uncontrolled due to inconsistent diet Did not tolerate Metformin and Januvia in the past Considering her history of liver cirrhosis and chronic nausea, would avoid GLP-1 agonist therapy  Started Toujeo 20 units every evening (used to take Toujeo 50 units every evening) Glucometer and supplies prescription sent, strictly advised to check blood glucose regularly and contact if blood glucose more than 300 or less than 70  Gvoke hypopen prescribed for hypoglycemia Advised to follow diabetic diet On ACEi and statin F/u CMP and HbA1c Diabetic eye exam: Advised to follow up with Ophthalmology for diabetic eye exam

## 2023-10-18 ENCOUNTER — Telehealth: Payer: Self-pay | Admitting: Internal Medicine

## 2023-10-18 ENCOUNTER — Other Ambulatory Visit: Payer: Self-pay | Admitting: Internal Medicine

## 2023-10-18 DIAGNOSIS — E1169 Type 2 diabetes mellitus with other specified complication: Secondary | ICD-10-CM

## 2023-10-18 NOTE — Telephone Encounter (Signed)
Needs   Insulin Pen Needle (BD PEN NEEDLE NANO U/F) 32G X 4 MM MISC [[id number]]

## 2023-10-21 NOTE — Telephone Encounter (Signed)
Refills sent to pharmacy. 

## 2023-11-06 ENCOUNTER — Ambulatory Visit (INDEPENDENT_AMBULATORY_CARE_PROVIDER_SITE_OTHER): Payer: Medicare HMO | Admitting: Internal Medicine

## 2023-11-06 ENCOUNTER — Encounter: Payer: Self-pay | Admitting: Internal Medicine

## 2023-11-06 VITALS — BP 138/54 | HR 57 | Ht 63.0 in | Wt 135.8 lb

## 2023-11-06 DIAGNOSIS — E1169 Type 2 diabetes mellitus with other specified complication: Secondary | ICD-10-CM

## 2023-11-06 DIAGNOSIS — E782 Mixed hyperlipidemia: Secondary | ICD-10-CM | POA: Diagnosis not present

## 2023-11-06 DIAGNOSIS — I10 Essential (primary) hypertension: Secondary | ICD-10-CM

## 2023-11-06 DIAGNOSIS — Z794 Long term (current) use of insulin: Secondary | ICD-10-CM | POA: Diagnosis not present

## 2023-11-06 DIAGNOSIS — F331 Major depressive disorder, recurrent, moderate: Secondary | ICD-10-CM | POA: Diagnosis not present

## 2023-11-06 NOTE — Patient Instructions (Addendum)
Please contact your insurance for checking your preferred DME agency for continuous glucose monitor (Dexcom or freestyle).  Please continue taking Toujeo 10 Units twice daily instead of 20 Units at bedtime.  Please continue to take other medications as prescribed.  Please continue to follow low carb diet and perform moderate exercise/walking as tolerated.  Please get fasting blood tests done before the next visit.

## 2023-11-06 NOTE — Progress Notes (Unsigned)
Established Patient Office Visit  Subjective:  Patient ID: [name] [name], female    DOB: [birth year]  Age: 72 y.o. MRN: [id number]  CC:  Chief Complaint  Patient presents with   Diabetes    Four week follow up     HPI [name] [name] is a 72 y.o. female with past medical history of NASH related liver cirrhosis, portal hypertensive gastropathy, esophageal varices, HTN, AS, DM, HLD, MGUS, iron deficiency anemia, depression and anxiety who presents for f/u of her chronic medical conditions.  DM: Her HbA1c had increased to 11.7 from 8.7 in 05/24.  She is taking glipizide 10 mg BID, but admits that she has not been following low-carb diet.  She did did not tolerate metformin and Januvia in the past.  She used to take Toujeo 50 units nightly in 2019 when she used to see Dr. Fransico Him. Denies any polyuria or polyphagia currently.  HTN: Her BP is well controlled today. She has been placed on Lasix  considering her history of liver cirrhosis and ascites, but does not take spironolactone due to myalgias.  She denies any headache, dizziness, chest pain, dyspnea or palpitations.  NASH related liver cirrhosis: She has recurrent ascites, had paracentesis on 05/19 -about 2.4 L fluid was removed.  Of note, she has not been taking spironolactone and takes only 40 mg of Lasix currently.  MDD: She is currently taking Prozac 60 mg daily for it, dose was recently increased from 40 mg.  She was placed on Seroquel 50 mg qHS for resistant depression and insomnia.  She still complains of apathy, lack of interest in routine activities and lack of appetite.  She still complains of anhedonia and agitation, but denies crying spells.  She also has spells of anxiety due to arguments with her husband. She denies SI or HI currently.  She has tried Zoloft, Lexapro, Effexor and Rexulti before.     Past Medical History:  Diagnosis Date   Allergy    Anemia, unspecified    Anxiety    Arthritis    Chronic diarrhea     Cirrhosis (HCC)    Depression    Depression    Elevated cholesterol    Enterocolitis due to Clostridium difficile, not specified as recurrent    Gastro-esophageal reflux disease with esophagitis    Helicobacter pylori gastritis 07/10/2017   Dx EGD -  1) Omeprazole 20 mg 2 times a day x 14 d 2) Pepto Bismol 2 tabs (262 mg each) 4 times a day x 14 d 3) Metronidazole 250 mg 4 times a day x 14 d 4) doxycycline 100 mg 2 times a day x 14 d  After 14 d stop omeprazole also  In 4 weeks after treatment completed do H. Pylori stool antigen - dx H. Pylori gastritis    Hx of adenomatous colonic polyps 10/05/2015   Hyperlipemia 02/21/2009   NUC STRESS TEST-NORMAL- EF 74%   Hypertension    Iron deficiency anemia    Irritable bowel syndrome    Localized edema    Major depressive disorder, single episode, unspecified    MVP (mitral valve prolapse)    Rectocele    Thrombocytopenia (HCC)    Type 2 diabetes mellitus with hyperglycemia [facility])     Past Surgical History:  Procedure Laterality Date   ABDOMINAL HYSTERECTOMY     BILATERAL SALPINGOOPHORECTOMY     carpel tunnel Bilateral 12/17/2009   COLONOSCOPY     ESOPHAGOGASTRODUODENOSCOPY     IR PARACENTESIS  05/03/2022  IR RADIOLOGIST EVAL & MGMT  11/12/2022   TUBAL LIGATION      Family History  Problem Relation Age of Onset   Anxiety disorder Mother    ADD / ADHD Brother    Alcohol abuse Brother        x 3   Drug abuse Brother    Anxiety disorder Maternal Aunt    Anxiety disorder Maternal Grandmother    Bipolar disorder Other    Emphysema Father    Diabetes Sister    Hyperlipidemia Sister    Cirrhosis Sister    Hyperlipidemia Sister    Diabetes Sister    Diabetes Maternal Uncle    Dementia Neg Hx    OCD Neg Hx    Paranoid behavior Neg Hx    Physical abuse Neg Hx    Schizophrenia Neg Hx    Seizures Neg Hx    Sexual abuse Neg Hx    Colon cancer Neg Hx    Colon polyps Neg Hx    Pancreatic cancer Neg Hx    Esophageal cancer Neg Hx     Stomach cancer Neg Hx    Kidney disease Neg Hx    Liver disease Neg Hx    Rectal cancer Neg Hx     Social History   Socioeconomic History   Marital status: Married    Spouse name: Harvie Heck   Number of children: 2   Years of education: Not on file   Highest education level: Not on file  Occupational History   Not on file  Tobacco Use   Smoking status: Former    Current packs/day: 0.00    Types: Cigarettes    Start date: 09/04/1967    Quit date: 09/03/1997    Years since quitting: 26.1    Passive exposure: Past   Smokeless tobacco: Former    Types: Snuff    Quit date: 05/2022   Tobacco comments:    call when ready to quit    06/14/2022 patient stated she quit 5 or so weeks ago  Vaping Use   Vaping status: Never Used  Substance and Sexual Activity   Alcohol use: No    Alcohol/week: 0.0 standard drinks of alcohol   Drug use: No   Sexual activity: Not on file  Other Topics Concern   Not on file  Social History Narrative   Married 2 sons 1 lives in [place] 1 lives in [place]   She is a housewife   No alcohol tobacco or drug use at this time   Former smoker quit in 1998   Social Determinants of Health   Financial Resource Strain: Low Risk  (10/30/2022)   Overall Financial Resource Strain (CARDIA)    Difficulty of Paying Living Expenses: Not hard at all  Food Insecurity: No Food Insecurity (10/30/2022)   Hunger Vital Sign    Worried About Running Out of Food in the Last Year: Never true    Ran Out of Food in the Last Year: Never true  Transportation Needs: No Transportation Needs (08/19/2021)   PRAPARE - Administrator, Civil Service (Medical): No    Lack of Transportation (Non-Medical): No  Physical Activity: Sufficiently Active (08/19/2021)   Exercise Vital Sign    Days of Exercise per Week: 5 days    Minutes of Exercise per Session: 30 min  Stress: No Stress Concern Present (10/30/2022)   [employer] of Occupational Health - Occupational  Stress Questionnaire    Feeling of Stress :  Not at all  Social Connections: Moderately Integrated (10/30/2022)   Social Connection and Isolation Panel [NHANES]    Frequency of Communication with Friends and Family: More than three times a week    Frequency of Social Gatherings with Friends and Family: Once a week    Attends Religious Services: 1 to 4 times per year    Active Member of [employer] or Organizations: No    Attends Banker Meetings: Not on file    Marital Status: Married  Catering manager Violence: Not At Risk (08/19/2021)   Humiliation, Afraid, Rape, and Kick questionnaire    Fear of Current or Ex-Partner: No    Emotionally Abused: No    Physically Abused: No    Sexually Abused: No    Outpatient Medications Prior to Visit  Medication Sig Dispense Refill   baclofen (LIORESAL) 10 MG tablet TAKE 1 TABLET BY MOUTH TWICE A DAY AS NEEDED FOR MUSCLE SPASMS 180 tablet 1   BD PEN NEEDLE NANO 2ND GEN 32G X 4 MM MISC USE AS DIRECTED 4 TIMES A DAY 100 each 5   BIOTIN PO Take 500 mg by mouth daily.     Blood Glucose Monitoring Suppl DEVI 1 each by Does not apply route in the morning, at noon, and at bedtime. May substitute to any manufacturer covered by patient's insurance. 1 each 0   carvedilol (COREG) 25 MG tablet TAKE 1 TABLET BY MOUTH TWICE A DAY 180 tablet 3   ferrous sulfate 325 (65 FE) MG EC tablet Take 325 mg by mouth 2 (two) times daily. One daily     FLUoxetine (PROZAC) 20 MG capsule Take 1 capsule (20 mg total) by mouth daily. Take in addition to 40 mg to total 60 mg once daily. 90 capsule 1   FLUoxetine (PROZAC) 40 MG capsule Take 1 capsule (40 mg total) by mouth daily. Take in addition to 20 mg to total 60 mg once daily. 90 capsule 1   furosemide (LASIX) 40 MG tablet Take 1 tablet (40 mg total) by mouth daily. 90 tablet 1   glipiZIDE (GLUCOTROL) 10 MG tablet TAKE 1 TABLET (10 MG TOTAL) BY MOUTH TWICE A DAY BEFORE A MEAL 180 tablet 1   Glucagon (GVOKE HYPOPEN 2-PACK) 1  MG/0.2ML SOAJ Inject 1 mg into the skin as needed (For blood glucose < 53). 0.4 mL 5   Glucose Blood (BLOOD GLUCOSE TEST STRIPS) STRP 1 each by In Vitro route in the morning, at noon, and at bedtime. May substitute to any manufacturer covered by patient's insurance. 100 strip 3   insulin glargine, 2 Unit Dial, (TOUJEO MAX SOLOSTAR) 300 UNIT/ML Solostar Pen Inject 20 Units into the skin every evening. 6 mL 0   Lancet Device MISC 1 each by Does not apply route in the morning, at noon, and at bedtime. May substitute to any manufacturer covered by patient's insurance. 100 each 2   ondansetron (ZOFRAN) 4 MG tablet Take 1 tablet (4 mg total) by mouth every 8 (eight) hours as needed for nausea or vomiting. 20 tablet 0   QUEtiapine (SEROQUEL) 50 MG tablet TAKE 1 TABLET BY MOUTH EVERYDAY AT BEDTIME 90 tablet 1   rosuvastatin (CRESTOR) 5 MG tablet TAKE 1 TABLET (5 MG TOTAL) BY MOUTH DAILY. TAKES ONE ON MONDAY, WED., AND FRIDAY 45 tablet 1   spironolactone (ALDACTONE) 25 MG tablet Take 1 tablet (25 mg total) by mouth daily. 90 tablet 3   triamcinolone cream (KENALOG) 0.1 % APPLY TO AFFECTED AREA  TWICE A DAY 30 g 0   No facility-administered medications prior to visit.    Allergies  Allergen Reactions   Codeine Other (See Comments)    Took it years ago and doesn't remember the reaction   Spironolactone Other (See Comments)    Reports intolerance , states unable to take the medication    ROS Review of Systems  Constitutional:  Positive for fatigue. Negative for chills and fever.  HENT:  Negative for congestion, sinus pressure, sinus pain and sore throat.   Eyes:  Negative for pain and discharge.  Respiratory:  Negative for cough and shortness of breath.   Cardiovascular:  Negative for chest pain and palpitations.  Gastrointestinal:  Negative for diarrhea, nausea and vomiting.  Endocrine: Negative for polydipsia and polyuria.  Genitourinary:  Negative for dysuria and hematuria.  Musculoskeletal:   Positive for arthralgias. Negative for neck pain and neck stiffness.  Skin:  Negative for rash.  Neurological:  Negative for dizziness and weakness.  Psychiatric/Behavioral:  Positive for dysphoric mood and sleep disturbance. Negative for agitation and behavioral problems. The patient is nervous/anxious.       Objective:    Physical Exam Vitals reviewed.  Constitutional:      General: She is not in acute distress.    Appearance: She is not diaphoretic.  HENT:     Head: Normocephalic and atraumatic.     Nose: Nose normal.     Mouth/Throat:     Mouth: Mucous membranes are moist.  Eyes:     General: No scleral icterus.    Extraocular Movements: Extraocular movements intact.  Cardiovascular:     Rate and Rhythm: Normal rate and regular rhythm.     Pulses: Normal pulses.     Heart sounds: Murmur (Systolic over left upper sternal border) heard.  Pulmonary:     Breath sounds: Normal breath sounds. No wheezing or rales.  Abdominal:     General: There is no distension.     Palpations: Abdomen is soft.  Musculoskeletal:     Right shoulder: No swelling. Decreased range of motion.     Cervical back: Neck supple. No tenderness.     Right lower leg: No edema.     Left lower leg: No edema.  Skin:    General: Skin is warm.     Findings: No rash.  Neurological:     General: No focal deficit present.     Mental Status: She is alert and oriented to person, place, and time.  Psychiatric:        Mood and Affect: Mood is depressed.        Behavior: Behavior is slowed.     BP (!) 142/61 (BP Location: Right Arm, Patient Position: Sitting, Cuff Size: Normal)   Pulse (!) 57   Ht 5\' 3"  (1.6 m)   Wt 135 lb 12.8 oz (61.6 kg)   SpO2 96%   BMI 24.06 kg/m  Wt Readings from Last 3 Encounters:  11/06/23 135 lb 12.8 oz (61.6 kg)  10/09/23 133 lb 12.8 oz (60.7 kg)  09/23/23 133 lb 9.6 oz (60.6 kg)    Lab Results  Component Value Date   TSH 2.760 04/29/2023   Lab Results  Component Value  Date   WBC 4.1 09/23/2023   HGB 10.5 (L) 09/23/2023   HCT 32.9 (L) 09/23/2023   MCV 80 09/23/2023   PLT 101 (L) 09/23/2023   Lab Results  Component Value Date   NA 139 09/23/2023   K 4.7  09/23/2023   CO2 24 09/23/2023   GLUCOSE 173 (H) 09/23/2023   BUN 20 09/23/2023   CREATININE 1.22 (H) 09/23/2023   BILITOT 0.6 09/23/2023   ALKPHOS 75 09/23/2023   AST 20 09/23/2023   ALT 7 09/23/2023   PROT 7.8 09/23/2023   ALBUMIN 3.8 (L) 09/23/2023   CALCIUM 9.3 09/23/2023   ANIONGAP 7 11/29/2021   EGFR 48 (L) 09/23/2023   GFR 48.42 (L) 04/25/2022   Lab Results  Component Value Date   CHOL 163 04/29/2023   Lab Results  Component Value Date   HDL 45 04/29/2023   Lab Results  Component Value Date   LDLCALC 101 (H) 04/29/2023   Lab Results  Component Value Date   TRIG 94 04/29/2023   Lab Results  Component Value Date   CHOLHDL 3.6 04/29/2023   Lab Results  Component Value Date   HGBA1C 11.7 (H) 09/23/2023      Assessment & Plan:   Problem List Items Addressed This Visit       Cardiovascular and Mediastinum   Essential hypertension    BP Readings from Last 1 Encounters:  11/06/23 (!) 142/61   Well controlled with Coreg and Lasix Had given Lasix 20 mg daily and spironolactone 25 mg daily to help with cirrhosis related ascites, but she has stopped spironolactone due to myalgias - increased Lasix to 40 mg QD for leg swelling and ascites Counseled for compliance with the medications Advised DASH diet and moderate exercise/walking, at least 150 mins/week      Relevant Orders   CBC     Endocrine   Type 2 diabetes mellitus with other specified complication (HCC) - Primary    Lab Results  Component Value Date   HGBA1C 11.7 (H) 09/23/2023   Associated with HTN and HLD On Glipizide 10 mg BID Uncontrolled due to inconsistent diet Did not tolerate Metformin and Januvia in the past Considering her history of liver cirrhosis and chronic nausea, would avoid GLP-1  agonist therapy  Started Toujeo 20 units every evening (used to take Toujeo 50 units every evening) Glucometer and supplies prescription sent, strictly advised to check blood glucose regularly and contact if blood glucose more than 300 or less than 70  Gvoke hypopen prescribed for hypoglycemia Advised to follow diabetic diet On ACEi and statin F/u CMP and HbA1c Diabetic eye exam: Advised to follow up with Ophthalmology for diabetic eye exam      Relevant Orders   CMP14+EGFR   Hemoglobin A1c     Other   Mixed hyperlipidemia   Relevant Orders   Lipid Profile   Major depressive disorder, recurrent episode, moderate (HCC)   Relevant Orders   CMP14+EGFR   CBC   TSH     No orders of the defined types were placed in this encounter.   Follow-up: Return in about 2 months (around 01/06/2024).    Anabel Halon, MD

## 2023-11-06 NOTE — Assessment & Plan Note (Signed)
Lab Results  Component Value Date   HGBA1C 11.7 (H) 09/23/2023   Associated with HTN and HLD On Glipizide 10 mg BID Uncontrolled due to inconsistent diet Did not tolerate Metformin and Januvia in the past Considering her history of liver cirrhosis and chronic nausea, would avoid GLP-1 agonist therapy  Started Toujeo 20 units every evening (used to take Toujeo 50 units every evening) Glucometer and supplies prescription sent, strictly advised to check blood glucose regularly and contact if blood glucose more than 300 or less than 70  Gvoke hypopen prescribed for hypoglycemia Advised to follow diabetic diet On ACEi and statin F/u CMP and HbA1c Diabetic eye exam: Advised to follow up with Ophthalmology for diabetic eye exam

## 2023-11-06 NOTE — Assessment & Plan Note (Signed)
BP Readings from Last 1 Encounters:  11/06/23 (!) 142/61   Well controlled with Coreg and Lasix Had given Lasix 20 mg daily and spironolactone 25 mg daily to help with cirrhosis related ascites, but she has stopped spironolactone due to myalgias - increased Lasix to 40 mg QD for leg swelling and ascites Counseled for compliance with the medications Advised DASH diet and moderate exercise/walking, at least 150 mins/week

## 2023-11-07 ENCOUNTER — Encounter: Payer: Self-pay | Admitting: Internal Medicine

## 2023-11-07 NOTE — Assessment & Plan Note (Addendum)
On Crestor 5 mg QD QOD Check lipid profile
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
## 2023-11-20 ENCOUNTER — Ambulatory Visit (INDEPENDENT_AMBULATORY_CARE_PROVIDER_SITE_OTHER): Payer: Medicare HMO

## 2023-11-20 VITALS — Ht 63.0 in | Wt 135.0 lb

## 2023-11-20 DIAGNOSIS — Z0000 Encounter for general adult medical examination without abnormal findings: Secondary | ICD-10-CM | POA: Diagnosis not present

## 2023-11-20 NOTE — Patient Instructions (Addendum)
[name] [name] , Thank you for taking time to come for your Medicare Wellness Visit. I appreciate your ongoing commitment to your health goals. Please review the following plan we discussed and let me know if I can assist you in the future.   Referrals/Orders/Follow-Ups/Clinician Recommendations:   This is a list of the screening recommended for you and due dates:  Health Maintenance  Topic Date Due   COVID-19 Vaccine (3 - Moderna risk series) 04/22/2020   Zoster (Shingles) Vaccine (2 of 2) 02/15/2022   Colon Cancer Screening  03/03/2024   Hemoglobin A1C  03/23/2024   Eye exam for diabetics  04/17/2024   Yearly kidney function blood test for diabetes  09/22/2024   Yearly kidney health urinalysis for diabetes  09/22/2024   Mammogram  10/03/2024   Complete foot exam   10/08/2024   Medicare Annual Wellness Visit  11/19/2024   DTaP/Tdap/Td vaccine (2 - Td or Tdap) 12/19/2031   Pneumonia Vaccine  Completed   Flu Shot  Completed   DEXA scan (bone density measurement)  Completed   Hepatitis C Screening  Completed   HPV Vaccine  Aged Out    Advanced directives: (Copy Requested) Please bring a copy of your health care power of attorney and living will to the office to be added to your chart at your convenience.  Next Medicare Annual Wellness Visit scheduled for next year: Yes

## 2023-11-20 NOTE — Progress Notes (Signed)
Subjective:   [name] [name] is a 72 y.o. female who presents for Medicare Annual (Subsequent) preventive examination.  Visit Complete: Virtual I connected with  [name] [name] on 11/20/23 by a audio enabled telemedicine application and verified that I am speaking with the correct person using two identifiers.  Patient Location: Home  Provider Location: Home Office  I discussed the limitations of evaluation and management by telemedicine. The patient expressed understanding and agreed to proceed.  Vital Signs: Because this visit was a virtual/telehealth visit, some criteria may be missing or patient reported. Any vitals not documented were not able to be obtained and vitals that have been documented are patient reported.    Cardiac Risk Factors include: advanced age (>58men, >16 women);diabetes mellitus;hypertension     Objective:    Today's Vitals   11/20/23 1138 11/20/23 1139  Weight: 135 lb (61.2 kg)   Height: 5\' 3"  (1.6 m)   PainSc:  0-No pain   Body mass index is 23.91 kg/m.     11/20/2023   11:45 AM 10/30/2022    3:44 PM 11/29/2021    4:19 PM 08/19/2021    8:13 AM 03/23/2020    2:40 PM 03/01/2020   10:49 AM 10/01/2017   10:52 AM  Advanced Directives  Does Patient Have a Medical Advance Directive? Yes No No No No No No  Type of Estate agent of [place];Living will        Copy of Healthcare Power of Attorney in Chart? No - copy requested        Would patient like information on creating a medical advance directive?  No - Patient declined  No - Patient declined No - Patient declined No - Patient declined     Current Medications (verified) Outpatient Encounter Medications as of 11/20/2023  Medication Sig   baclofen (LIORESAL) 10 MG tablet TAKE 1 TABLET BY MOUTH TWICE A DAY AS NEEDED FOR MUSCLE SPASMS   BD PEN NEEDLE NANO 2ND GEN 32G X 4 MM MISC USE AS DIRECTED 4 TIMES A DAY   BIOTIN PO Take 500 mg by mouth daily.   Blood Glucose Monitoring  Suppl DEVI 1 each by Does not apply route in the morning, at noon, and at bedtime. May substitute to any manufacturer covered by patient's insurance.   carvedilol (COREG) 25 MG tablet TAKE 1 TABLET BY MOUTH TWICE A DAY   ferrous sulfate 325 (65 FE) MG EC tablet Take 325 mg by mouth 2 (two) times daily. One daily   FLUoxetine (PROZAC) 20 MG capsule Take 1 capsule (20 mg total) by mouth daily. Take in addition to 40 mg to total 60 mg once daily.   FLUoxetine (PROZAC) 40 MG capsule Take 1 capsule (40 mg total) by mouth daily. Take in addition to 20 mg to total 60 mg once daily.   furosemide (LASIX) 40 MG tablet Take 1 tablet (40 mg total) by mouth daily.   glipiZIDE (GLUCOTROL) 10 MG tablet TAKE 1 TABLET (10 MG TOTAL) BY MOUTH TWICE A DAY BEFORE A MEAL   Glucagon (GVOKE HYPOPEN 2-PACK) 1 MG/0.2ML SOAJ Inject 1 mg into the skin as needed (For blood glucose < 53).   Glucose Blood (BLOOD GLUCOSE TEST STRIPS) STRP 1 each by In Vitro route in the morning, at noon, and at bedtime. May substitute to any manufacturer covered by patient's insurance.   insulin glargine, 2 Unit Dial, (TOUJEO MAX SOLOSTAR) 300 UNIT/ML Solostar Pen Inject 20 Units into the skin every  evening.   ondansetron (ZOFRAN) 4 MG tablet Take 1 tablet (4 mg total) by mouth every 8 (eight) hours as needed for nausea or vomiting.   QUEtiapine (SEROQUEL) 50 MG tablet TAKE 1 TABLET BY MOUTH EVERYDAY AT BEDTIME   rosuvastatin (CRESTOR) 5 MG tablet TAKE 1 TABLET (5 MG TOTAL) BY MOUTH DAILY. TAKES ONE ON MONDAY, WED., AND FRIDAY   spironolactone (ALDACTONE) 25 MG tablet Take 1 tablet (25 mg total) by mouth daily.   triamcinolone cream (KENALOG) 0.1 % APPLY TO AFFECTED AREA TWICE A DAY   No facility-administered encounter medications on file as of 11/20/2023.    Allergies (verified) Codeine and Spironolactone   History: Past Medical History:  Diagnosis Date   Allergy    Anemia, unspecified    Anxiety    Arthritis    Chronic diarrhea     Cirrhosis (HCC)    Depression    Depression    Elevated cholesterol    Enterocolitis due to Clostridium difficile, not specified as recurrent    Gastro-esophageal reflux disease with esophagitis    Helicobacter pylori gastritis 07/10/2017   Dx EGD -  1) Omeprazole 20 mg 2 times a day x 14 d 2) Pepto Bismol 2 tabs (262 mg each) 4 times a day x 14 d 3) Metronidazole 250 mg 4 times a day x 14 d 4) doxycycline 100 mg 2 times a day x 14 d  After 14 d stop omeprazole also  In 4 weeks after treatment completed do H. Pylori stool antigen - dx H. Pylori gastritis    Hx of adenomatous colonic polyps 10/05/2015   Hyperlipemia 02/21/2009   NUC STRESS TEST-NORMAL- EF 74%   Hypertension    Iron deficiency anemia    Irritable bowel syndrome    Localized edema    Major depressive disorder, single episode, unspecified    MVP (mitral valve prolapse)    Rectocele    Thrombocytopenia (HCC)    Type 2 diabetes mellitus with hyperglycemia (HCC)    Past Surgical History:  Procedure Laterality Date   ABDOMINAL HYSTERECTOMY     BILATERAL SALPINGOOPHORECTOMY     carpel tunnel Bilateral 12/17/2009   COLONOSCOPY     ESOPHAGOGASTRODUODENOSCOPY     IR PARACENTESIS  05/03/2022   IR RADIOLOGIST EVAL & MGMT  11/12/2022   TUBAL LIGATION     Family History  Problem Relation Age of Onset   Anxiety disorder Mother    ADD / ADHD Brother    Alcohol abuse Brother        x 3   Drug abuse Brother    Anxiety disorder Maternal Aunt    Anxiety disorder Maternal Grandmother    Bipolar disorder Other    Emphysema Father    Diabetes Sister    Hyperlipidemia Sister    Cirrhosis Sister    Hyperlipidemia Sister    Diabetes Sister    Diabetes Maternal Uncle    Dementia Neg Hx    OCD Neg Hx    Paranoid behavior Neg Hx    Physical abuse Neg Hx    Schizophrenia Neg Hx    Seizures Neg Hx    Sexual abuse Neg Hx    Colon cancer Neg Hx    Colon polyps Neg Hx    Pancreatic cancer Neg Hx    Esophageal cancer Neg Hx     Stomach cancer Neg Hx    Kidney disease Neg Hx    Liver disease Neg Hx    Rectal cancer Neg  Hx    Social History   Socioeconomic History   Marital status: Married    Spouse name: Harvie Heck   Number of children: 2   Years of education: Not on file   Highest education level: Not on file  Occupational History   Not on file  Tobacco Use   Smoking status: Former    Current packs/day: 0.00    Types: Cigarettes    Start date: 09/04/1967    Quit date: 09/03/1997    Years since quitting: 26.2    Passive exposure: Past   Smokeless tobacco: Former    Types: Snuff    Quit date: 05/2022   Tobacco comments:    call when ready to quit    06/14/2022 patient stated she quit 5 or so weeks ago  Vaping Use   Vaping status: Never Used  Substance and Sexual Activity   Alcohol use: No    Alcohol/week: 0.0 standard drinks of alcohol   Drug use: No   Sexual activity: Not on file  Other Topics Concern   Not on file  Social History Narrative   Married 2 sons 1 lives in [place] 1 lives in [place]   She is a housewife   No alcohol tobacco or drug use at this time   Former smoker quit in 1998   Social Determinants of Health   Financial Resource Strain: Low Risk  (11/20/2023)   Overall Financial Resource Strain (CARDIA)    Difficulty of Paying Living Expenses: Not hard at all  Food Insecurity: No Food Insecurity (11/20/2023)   Hunger Vital Sign    Worried About Running Out of Food in the Last Year: Never true    Ran Out of Food in the Last Year: Never true  Transportation Needs: No Transportation Needs (11/20/2023)   PRAPARE - Administrator, Civil Service (Medical): No    Lack of Transportation (Non-Medical): No  Physical Activity: Inactive (11/20/2023)   Exercise Vital Sign    Days of Exercise per Week: 0 days    Minutes of Exercise per Session: 0 min  Stress: No Stress Concern Present (11/20/2023)   [employer] of Occupational Health - Occupational Stress  Questionnaire    Feeling of Stress : Not at all  Social Connections: Socially Integrated (11/20/2023)   Social Connection and Isolation Panel [NHANES]    Frequency of Communication with Friends and Family: More than three times a week    Frequency of Social Gatherings with Friends and Family: More than three times a week    Attends Religious Services: More than 4 times per year    Active Member of [employer] or Organizations: Yes    Attends Engineer, structural: More than 4 times per year    Marital Status: Married    Tobacco Counseling Counseling given: Not Answered Tobacco comments: call when ready to quit 06/14/2022 patient stated she quit 5 or so weeks ago   Clinical Intake:  Pre-visit preparation completed: Yes  Pain : No/denies pain Pain Score: 0-No pain     BMI - recorded: 23.91 Nutritional Status: BMI of 19-24  Normal Nutritional Risks: None Diabetes: Yes CBG done?: Yes (CBG 279 Per patient) CBG resulted in Enter/ Edit results?: Yes Did pt. bring in CBG monitor from home?: No  How often do you need to have someone help you when you read instructions, pamphlets, or other written materials from your doctor or pharmacy?: 1 - Never  Interpreter Needed?: No  Information entered by ::  Theresa Mulligan LPN   Activities of Daily Living    11/20/2023   11:43 AM  In your present state of health, do you have any difficulty performing the following activities:  Hearing? 0  Vision? 0  Difficulty concentrating or making decisions? 0  Walking or climbing stairs? 0  Dressing or bathing? 0  Doing errands, shopping? 0  Preparing Food and eating ? N  Using the Toilet? N  In the past six months, have you accidently leaked urine? N  Do you have problems with loss of bowel control? Y  Comment Wears breifs and pads. Followed by Gastrologist  Managing your Medications? N  Managing your Finances? N  Housekeeping or managing your Housekeeping? N    Patient Care  Team: Anabel Halon, MD as PCP - General (Internal Medicine) Croitoru, Rachelle Hora, MD as PCP - Cardiology (Cardiology)  Indicate any recent Medical Services you may have received from other than [facility] providers in the past year (date may be approximate).     Assessment:   This is a routine wellness examination for Malik.  Hearing/Vision screen Hearing Screening - Comments:: Denies hearing difficulties   Vision Screening - Comments:: Wears rx glasses - up to date with routine eye exams with  Dr Montez Morita   Goals Addressed               This Visit's Progress     Patient Stated (pt-stated)        Just wants to feel better!       Depression Screen    11/20/2023   11:43 AM 11/06/2023   10:51 AM 10/09/2023   10:27 AM 09/23/2023   11:02 AM 05/23/2023    9:27 AM 02/19/2023    3:52 PM 01/02/2023   10:43 AM  PHQ 2/9 Scores  PHQ - 2 Score 0 4 0 0 1 4 0  PHQ- 9 Score 0 10    11     Fall Risk    11/20/2023   11:44 AM 11/06/2023   10:51 AM 10/09/2023   10:27 AM 09/23/2023   11:02 AM 05/23/2023    9:26 AM  Fall Risk   Falls in the past year? 1 1 0 0 1  Number falls in past yr: 0 0 0 0 0  Injury with Fall? 0 0 0 0 0  Risk for fall due to : No Fall Risks No Fall Risks No Fall Risks No Fall Risks   Follow up Falls prevention discussed Falls evaluation completed Falls evaluation completed Falls evaluation completed     MEDICARE RISK AT HOME: Medicare Risk at Home Any stairs in or around the home?: Yes If so, are there any without handrails?: No Home free of loose throw rugs in walkways, pet beds, electrical cords, etc?: Yes Adequate lighting in your home to reduce risk of falls?: Yes Life alert?: No Use of a cane, walker or w/c?: No Grab bars in the bathroom?: Yes Shower chair or bench in shower?: Yes Elevated toilet seat or a handicapped toilet?: No  TIMED UP AND GO:  Was the test performed?  No    Cognitive Function:    08/19/2021    8:15 AM  MMSE - Mini Mental State Exam   Not completed: Unable to complete        11/20/2023   11:46 AM 10/30/2022    3:47 PM 08/19/2021    8:16 AM  6CIT Screen  What Year? 0 points 0 points 0 points  What month?  0 points 0 points 0 points  What time? 0 points 0 points 0 points  Count back from 20 0 points 0 points 0 points  Months in reverse 4 points 2 points 0 points  Repeat phrase 4 points 2 points 2 points  Total Score 8 points 4 points 2 points    Immunizations Immunization History  Administered Date(s) Administered   Fluad Quad(high Dose 65+) 08/31/2021, 09/28/2022   Fluad Trivalent(High Dose 65+) 09/23/2023   Influenza,inj,Quad PF,6+ Mos 09/16/2014, 09/25/2017   Influenza-Unspecified 10/10/2011, 10/16/2013, 09/22/2015   Moderna Sars-Covid-2 Vaccination 02/18/2020, 03/25/2020   PNEUMOCOCCAL CONJUGATE-20 09/28/2022   Pneumococcal Polysaccharide-23 02/18/2018   Tdap 12/18/2021   Zoster Recombinant(Shingrix) 12/21/2021    TDAP status: Up to date  Flu Vaccine status: Up to date  Pneumococcal vaccine status: Up to date  Covid-19 vaccine status: Declined, Education has been provided regarding the importance of this vaccine but patient still declined. Advised may receive this vaccine at local pharmacy or Health Dept.or vaccine clinic. Aware to provide a copy of the vaccination record if obtained from local pharmacy or Health Dept. Verbalized acceptance and understanding.  Qualifies for Shingles Vaccine? Yes   Zostavax completed No   Shingrix Completed?: No.    Education has been provided regarding the importance of this vaccine. Patient has been advised to call insurance company to determine out of pocket expense if they have not yet received this vaccine. Advised may also receive vaccine at local pharmacy or Health Dept. Verbalized acceptance and understanding.  Screening Tests Health Maintenance  Topic Date Due   COVID-19 Vaccine (3 - Moderna risk series) 04/22/2020   Zoster Vaccines- Shingrix (2 of 2)  02/15/2022   Colonoscopy  03/03/2024   HEMOGLOBIN A1C  03/23/2024   OPHTHALMOLOGY EXAM  04/17/2024   Diabetic kidney evaluation - eGFR measurement  09/22/2024   Diabetic kidney evaluation - Urine ACR  09/22/2024   MAMMOGRAM  10/03/2024   FOOT EXAM  10/08/2024   Medicare Annual Wellness (AWV)  11/19/2024   DTaP/Tdap/Td (2 - Td or Tdap) 12/19/2031   Pneumonia Vaccine 86+ Years old  Completed   INFLUENZA VACCINE  Completed   DEXA SCAN  Completed   Hepatitis C Screening  Completed   HPV VACCINES  Aged Out    Health Maintenance  Health Maintenance Due  Topic Date Due   COVID-19 Vaccine (3 - Moderna risk series) 04/22/2020   Zoster Vaccines- Shingrix (2 of 2) 02/15/2022   Colonoscopy  03/03/2024    Colorectal cancer screening: Type of screening: Colonoscopy. Completed 03/04/19. Repeat every 5 years  Mammogram status: Completed 10/03/22. Repeat every year  Bone Density status: Completed 10/03/22. Results reflect: Bone density results: OSTEOPENIA. Repeat every   years.    Additional Screening:  Hepatitis C Screening: does qualify; Completed 02/09/21  Vision Screening: Recommended annual ophthalmology exams for early detection of glaucoma and other disorders of the eye. Is the patient up to date with their annual eye exam?  Yes  Who is the provider or what is the name of the office in which the patient attends annual eye exams? Dr Montez Morita If pt is not established with a provider, would they like to be referred to a provider to establish care? No .   Dental Screening: Recommended annual dental exams for proper oral hygiene  Diabetic Foot Exam: Diabetic Foot Exam: Completed 10/09/23  Community Resource Referral / Chronic Care Management:  CRR required this visit?  No   CCM required this visit?  No  Plan:     I have personally reviewed and noted the following in the patient's chart:   Medical and social history Use of alcohol, tobacco or illicit drugs  Current  medications and supplements including opioid prescriptions. Patient is not currently taking opioid prescriptions. Functional ability and status Nutritional status Physical activity Advanced directives List of other physicians Hospitalizations, surgeries, and ER visits in previous 12 months Vitals Screenings to include cognitive, depression, and falls Referrals and appointments  In addition, I have reviewed and discussed with patient certain preventive protocols, quality metrics, and best practice recommendations. A written personalized care plan for preventive services as well as general preventive health recommendations were provided to patient.     Tillie Rung, LPN   [id number]   After Visit Summary: (MyChart) Due to this being a telephonic visit, the after visit summary with patients personalized plan was offered to patient via MyChart   Nurse Notes: None

## 2023-11-28 ENCOUNTER — Ambulatory Visit (INDEPENDENT_AMBULATORY_CARE_PROVIDER_SITE_OTHER): Payer: Medicare HMO | Admitting: Gastroenterology

## 2023-11-28 VITALS — BP 151/76 | HR 61 | Temp 98.2°F | Ht 63.0 in | Wt 138.1 lb

## 2023-11-28 DIAGNOSIS — K7460 Unspecified cirrhosis of liver: Secondary | ICD-10-CM | POA: Diagnosis not present

## 2023-11-28 DIAGNOSIS — D509 Iron deficiency anemia, unspecified: Secondary | ICD-10-CM | POA: Diagnosis not present

## 2023-11-28 NOTE — Patient Instructions (Signed)
-  You are due for some updated labs, you can have these done next month with the labs Dr. Allena Katz is doing  -we will update Ultrasound of the liver  - Continue carvedilol 25mg  twice daily  -continue lasix 40mg  daily  - Reduce salt intake to <2 g per day - Can take Tylenol max of 2 g per day (650 mg q8h) for pain - Avoid NSAIDs for pain - Avoid eating raw oysters/shellfish - Ensure every night before going to sleep  At this time your abdomen does not appear to have much fluid, if you notice abdomen becoming more swollen or tight, you need to make me aware, pending what the US shows, we may increase your fluid pill some especially if you think you are having some fluid accumulation.  Follow up 6 months

## 2023-11-28 NOTE — Progress Notes (Addendum)
Referring Provider: Anabel Halon, MD Primary Care Physician:  Anabel Halon, MD Primary GI Physician: Dr. Levon Hedger   Chief Complaint  Patient presents with   Cirrhosis    Follow up on Cirrhosis. Thinks she is having some swelling in abdomen. Can't eat much because she gets full quick.    HPI:   [name] [name] is a 72 y.o. female with past medical history of  NASH cirrhosis, MGUS, H pylori gastritis treatd in 2018, IDA, adenomatous colon polyps, moderate aortic stenosis, HTN.    Patient presenting today for follow up of cirrhosis and IDA   Last seen may 2024, at that time  taking lasix but unsure of the last time she took spironolactone as she ran out and the pharmacy never contacted her to refill. Over the past few weeks she had noted more swelling to her abdomen, feeling full when eatings. Recent paracentesis which provided relief. taking an iron pill once daily. She has continued fecal urgency but usually just one BM per day. No rectal bleeding or melena. taking coreg twice a day and denies dizziness or syncope.    Recommended to resume spironolactone, continue lasix 20mg , continue coreg 25mg  IBD, INR, afp, iron studies  TIPS evaluation with Dr. Elby Showers 11/12/22-not felt a candidate due to low amount of ascites   Present: Patient states that she feels she is having more swelling in her abdomen again over the past few weeks. Is taking lasix but not sprionolactone, notes this makes her "feel bad" overall when she does. She is not sure if she ever restarted spironolactone after last OV. Appears she is taking lasix 40mg  daily, PCP increased from 20mg  to 40mg  in June. She notes she is urinating frequently but feels this is less in volume than previously. Denies LE edema.  Denies episodes of confusion. Denies jaundice, has some itching but notes her skin is very dry. Reports compliance with carvedilol BID. Having a BM daily. She does note that her umbilical hernia is no longer visible.no  rectal bleeding or melena.   Last MELD 3.0 was 24 Apr 2023   Cirrhosis related questions: Episodes of confusion/disorientation: no Taking diuretics? Lasix 40mg  daily  Beta blockers? Carvedilol 25mg  BID Prior history of variceal banding? No  Prior episodes of SBP? No  Last liver imaging:May 2024, Cirrhotic morphology. No focal lesion identified. Cholelithiasis. Perihepatic ascites. Alcohol use: No   Last Colonoscopy:03/04/19- One diminutive polyp in the transverse colon, diminutive SSP - The examination was otherwise normal on direct and retroflexion views. - Personal history of colonic polyps. 2 adenomas max 15 mm 2016 Last Endoscopy:April 2022 with grade II varices, portal hypertensive gastropathy   Recommendations:  TCS due March 2025   Past Medical History:  Diagnosis Date   Allergy    Anemia, unspecified    Anxiety    Arthritis    Chronic diarrhea    Cirrhosis (HCC)    Depression    Depression    Elevated cholesterol    Enterocolitis due to Clostridium difficile, not specified as recurrent    Gastro-esophageal reflux disease with esophagitis    Helicobacter pylori gastritis 07/10/2017   Dx EGD -  1) Omeprazole 20 mg 2 times a day x 14 d 2) Pepto Bismol 2 tabs (262 mg each) 4 times a day x 14 d 3) Metronidazole 250 mg 4 times a day x 14 d 4) doxycycline 100 mg 2 times a day x 14 d  After 14 d stop omeprazole also  In  4 weeks after treatment completed do H. Pylori stool antigen - dx H. Pylori gastritis    Hx of adenomatous colonic polyps 10/05/2015   Hyperlipemia 02/21/2009   NUC STRESS TEST-NORMAL- EF 74%   Hypertension    Iron deficiency anemia    Irritable bowel syndrome    Localized edema    Major depressive disorder, single episode, unspecified    MVP (mitral valve prolapse)    Rectocele    Thrombocytopenia (HCC)    Type 2 diabetes mellitus with hyperglycemia (HCC)     Past Surgical History:  Procedure Laterality Date   ABDOMINAL HYSTERECTOMY     BILATERAL  SALPINGOOPHORECTOMY     carpel tunnel Bilateral 12/17/2009   COLONOSCOPY     ESOPHAGOGASTRODUODENOSCOPY     IR PARACENTESIS  05/03/2022   IR RADIOLOGIST EVAL & MGMT  11/12/2022   TUBAL LIGATION      Current Outpatient Medications  Medication Sig Dispense Refill   baclofen (LIORESAL) 10 MG tablet TAKE 1 TABLET BY MOUTH TWICE A DAY AS NEEDED FOR MUSCLE SPASMS 180 tablet 1   BD PEN NEEDLE NANO 2ND GEN 32G X 4 MM MISC USE AS DIRECTED 4 TIMES A DAY 100 each 5   BIOTIN PO Take 500 mg by mouth daily.     Blood Glucose Monitoring Suppl DEVI 1 each by Does not apply route in the morning, at noon, and at bedtime. May substitute to any manufacturer covered by patient's insurance. 1 each 0   carvedilol (COREG) 25 MG tablet TAKE 1 TABLET BY MOUTH TWICE A DAY 180 tablet 3   ferrous sulfate 325 (65 FE) MG EC tablet Take 325 mg by mouth 2 (two) times daily. One daily     FLUoxetine (PROZAC) 20 MG capsule Take 1 capsule (20 mg total) by mouth daily. Take in addition to 40 mg to total 60 mg once daily. 90 capsule 1   FLUoxetine (PROZAC) 40 MG capsule Take 1 capsule (40 mg total) by mouth daily. Take in addition to 20 mg to total 60 mg once daily. 90 capsule 1   furosemide (LASIX) 40 MG tablet Take 1 tablet (40 mg total) by mouth daily. 90 tablet 1   glipiZIDE (GLUCOTROL) 10 MG tablet TAKE 1 TABLET (10 MG TOTAL) BY MOUTH TWICE A DAY BEFORE A MEAL 180 tablet 1   Glucagon (GVOKE HYPOPEN 2-PACK) 1 MG/0.2ML SOAJ Inject 1 mg into the skin as needed (For blood glucose < 53). 0.4 mL 5   Glucose Blood (BLOOD GLUCOSE TEST STRIPS) STRP 1 each by In Vitro route in the morning, at noon, and at bedtime. May substitute to any manufacturer covered by patient's insurance. 100 strip 3   insulin glargine, 2 Unit Dial, (TOUJEO MAX SOLOSTAR) 300 UNIT/ML Solostar Pen Inject 20 Units into the skin every evening. 6 mL 0   ondansetron (ZOFRAN) 4 MG tablet Take 1 tablet (4 mg total) by mouth every 8 (eight) hours as needed for nausea or  vomiting. 20 tablet 0   QUEtiapine (SEROQUEL) 50 MG tablet TAKE 1 TABLET BY MOUTH EVERYDAY AT BEDTIME 90 tablet 1   rosuvastatin (CRESTOR) 5 MG tablet TAKE 1 TABLET (5 MG TOTAL) BY MOUTH DAILY. TAKES ONE ON MONDAY, WED., AND FRIDAY 45 tablet 1   spironolactone (ALDACTONE) 25 MG tablet Take 1 tablet (25 mg total) by mouth daily. 90 tablet 3   triamcinolone cream (KENALOG) 0.1 % APPLY TO AFFECTED AREA TWICE A DAY 30 g 0   No current facility-administered medications  for this visit.    Allergies as of 11/28/2023 - Review Complete 11/28/2023  Allergen Reaction Noted   Codeine Other (See Comments)    Spironolactone Other (See Comments) 10/09/2021    Family History  Problem Relation Age of Onset   Anxiety disorder Mother    ADD / ADHD Brother    Alcohol abuse Brother        x 3   Drug abuse Brother    Anxiety disorder Maternal Aunt    Anxiety disorder Maternal Grandmother    Bipolar disorder Other    Emphysema Father    Diabetes Sister    Hyperlipidemia Sister    Cirrhosis Sister    Hyperlipidemia Sister    Diabetes Sister    Diabetes Maternal Uncle    Dementia Neg Hx    OCD Neg Hx    Paranoid behavior Neg Hx    Physical abuse Neg Hx    Schizophrenia Neg Hx    Seizures Neg Hx    Sexual abuse Neg Hx    Colon cancer Neg Hx    Colon polyps Neg Hx    Pancreatic cancer Neg Hx    Esophageal cancer Neg Hx    Stomach cancer Neg Hx    Kidney disease Neg Hx    Liver disease Neg Hx    Rectal cancer Neg Hx     Social History   Socioeconomic History   Marital status: Married    Spouse name: Harvie Heck   Number of children: 2   Years of education: Not on file   Highest education level: Not on file  Occupational History   Not on file  Tobacco Use   Smoking status: Former    Current packs/day: 0.00    Types: Cigarettes    Start date: 09/04/1967    Quit date: 09/03/1997    Years since quitting: 26.2    Passive exposure: Past   Smokeless tobacco: Former    Types: Snuff    Quit  date: 05/2022   Tobacco comments:    call when ready to quit    06/14/2022 patient stated she quit 5 or so weeks ago  Vaping Use   Vaping status: Never Used  Substance and Sexual Activity   Alcohol use: No    Alcohol/week: 0.0 standard drinks of alcohol   Drug use: No   Sexual activity: Not on file  Other Topics Concern   Not on file  Social History Narrative   Married 2 sons 1 lives in [place] 1 lives in [place]   She is a housewife   No alcohol tobacco or drug use at this time   Former smoker quit in 1998   Social Drivers of Health   Financial Resource Strain: Low Risk  (11/20/2023)   Overall Financial Resource Strain (CARDIA)    Difficulty of Paying Living Expenses: Not hard at all  Food Insecurity: No Food Insecurity (11/20/2023)   Hunger Vital Sign    Worried About Running Out of Food in the Last Year: Never true    Ran Out of Food in the Last Year: Never true  Transportation Needs: No Transportation Needs (11/20/2023)   PRAPARE - Administrator, Civil Service (Medical): No    Lack of Transportation (Non-Medical): No  Physical Activity: Inactive (11/20/2023)   Exercise Vital Sign    Days of Exercise per Week: 0 days    Minutes of Exercise per Session: 0 min  Stress: No Stress Concern Present (11/20/2023)  [employer] of Occupational Health - Occupational Stress Questionnaire    Feeling of Stress : Not at all  Social Connections: Socially Integrated (11/20/2023)   Social Connection and Isolation Panel [NHANES]    Frequency of Communication with Friends and Family: More than three times a week    Frequency of Social Gatherings with Friends and Family: More than three times a week    Attends Religious Services: More than 4 times per year    Active Member of [employer] or Organizations: Yes    Attends Engineer, structural: More than 4 times per year    Marital Status: Married    Review of systems General: negative for malaise, night sweats,  fever, chills, weight loss Neck: Negative for lumps, goiter, pain and significant neck swelling Resp: Negative for cough, wheezing, dyspnea at rest CV: Negative for chest pain, leg swelling, palpitations, orthopnea GI: denies melena, hematochezia, nausea, vomiting, diarrhea, constipation, dysphagia, odyonophagia, early satiety or unintentional weight loss.  MSK: Negative for joint pain or swelling, back pain, and muscle pain. Derm: Negative for itching or rash Psych: Denies depression, anxiety, memory loss, confusion. No homicidal or suicidal ideation.  Heme: Negative for prolonged bleeding, bruising easily, and swollen nodes. Endocrine: Negative for cold or heat intolerance, polyuria, polydipsia and goiter. Neuro: negative for tremor, gait imbalance, syncope and seizures. The remainder of the review of systems is noncontributory.  Physical Exam: BP (!) 176/70 (BP Location: Left Arm, Patient Position: Sitting, Cuff Size: Normal)   Pulse (!) 57   Temp 98.2 F (36.8 C) (Oral)   Ht 5\' 3"  (1.6 m)   Wt 138 lb 1.6 oz (62.6 kg)   BMI 24.46 kg/m  General:   Alert and oriented. No distress noted. Pleasant and cooperative.  Head:  Normocephalic and atraumatic. Eyes:  Conjuctiva clear without scleral icterus. Mouth:  Oral mucosa pink and moist. Good dentition. No lesions. Heart: Normal rate and rhythm, s1 and s2 heart sounds present.  Lungs: Clear lung sounds in all lobes. Respirations equal and unlabored. Abdomen:  +BS, soft, non-tender and non-distended. No rebound or guarding. No HSM or masses noted. Derm: No palmar erythema or jaundice Msk:  Symmetrical without gross deformities. Normal posture. Extremities:  Without edema. Neurologic:  Alert and  oriented x4. No asterixis  Psych:  Alert and cooperative. Normal mood and affect.  Invalid input(s): "6 MONTHS"   ASSESSMENT: SHANDRICKA MANZIONE is a 72 y.o. female presenting today for follow up of cirrhosis and IDA  Cirrhosis: MELD has  remained relatively low with last MELD 3.0 in may was 8. history of Grade II EVs on last EGD in 2022, on coreg 25mg  BID (HR at goal of 57 today) with reported compliance, denies episodes of confusion. EGD done by [facility] and after discussion with them, it was felt she did not need repeat EGD for EV evaluation right now due to being on a NSBB.  Not felt to be a TIPS candidate due to ability to titrate uptitration of diuretics, appeared to be more of a compliance issue previously though she again today tells me she cannot tolerate spironolactone and is not currently on this. She feels she may have some ascites though exam today without appearance of ascites, her umbilical hernia is actually no longer visible as it was at last exam. Her PCP increased lasix to 40mg  daily in june. Will continue with current medication doses, she is due for MELD labs, INR, AFP and [place] for HCC screening, she is having labs  done with PCP in january and requested to have all labs done at the same time. Pending how labs look and if US shows ascites, could consider trying to increase her lasix some if needed though will need to be mindful of her renal function as creatnine was 1.22 in october . She is to make me aware of her abdomen becoming more swollen or tight.   IDA: present since may 2023, iron studies on 05/07/23 with iron 33, TIBC 270, sat 12 and ferritin 69, patient was recommended to have repeat EGD/Colonoscopy at that time though she declined to have procedures done. She is maintained on PO iron daily. Denies rectal bleeding or melena. Hgb stable at 10.5 in October.    PLAN:  -CBC, CMP, INR, AFP (she is having labs with PCP, will add on INR and AFP to have drawn at that time, per her request) -RUQ [place]  -continue daily PO iron  - Continue carvedilol 25mg  BID -continue lasix 40mg  daily  -  consider increase in lasix pending US/Labs - Reduce salt intake to <2 g per day - Can take Tylenol max of 2 g per day (650 mg q8h) for  pain - Avoid NSAIDs for pain - Avoid eating raw oysters/shellfish - Ensure every night before going to sleep  All questions were answered, patient verbalized understanding and is in agreement with plan as outlined above.   Follow Up: 6 months    Legrande Hao L. Jeanmarie Hubert, MSN, APRN, AGNP-C Adult-Gerontology Nurse Practitioner [facility]   I have reviewed the note and agree with the APP's assessment as described in this progress note  At this point, may consider starting the patient on eplerenone or triamterene instead of spironolactone.  This will need to be ordered once labs are back and there is proof that renal function is stable.  Katrinka Blazing, MD Gastroenterology and Hepatology [facility]

## 2023-12-02 ENCOUNTER — Ambulatory Visit (HOSPITAL_COMMUNITY): Payer: Medicare HMO | Attending: Gastroenterology

## 2023-12-05 NOTE — Patient Instructions (Signed)

## 2023-12-05 NOTE — Progress Notes (Signed)
Established Patient Office Visit   Subjective  Patient ID: [name] [name], female    DOB: [birth year]  Age: 72 y.o. MRN: [id number]  Chief Complaint  Patient presents with   Acute Visit    Cough, Laryngitis, sore throat ,no fever x 1week    She  has a past medical history of Allergy, Anemia, unspecified, Anxiety, Arthritis, Chronic diarrhea, Cirrhosis (HCC), Depression, Depression, Elevated cholesterol, Enterocolitis due to Clostridium difficile, not specified as recurrent, Gastro-esophageal reflux disease with esophagitis, Helicobacter pylori gastritis (07/10/2017), adenomatous colonic polyps (10/05/2015), Hyperlipemia (02/21/2009), Hypertension, Iron deficiency anemia, Irritable bowel syndrome, Localized edema, Major depressive disorder, single episode, unspecified, MVP (mitral valve prolapse), Rectocele, Thrombocytopenia (HCC), and Type 2 diabetes mellitus with hyperglycemia (HCC).  Patient complains of evaluation of cough. Patient describes symptoms of cough, fatigue, malaise, myalgias, sore throat, and sputum production. Symptoms began several days ago and are unchanged since that time. Patient denies chest pain or nausea and vomiting. Treatment thus far includes OTC analgesics/antipyretics: somewhat effective and anti-tussive: somewhat effective Past pulmonary history is significant for no history of pneumonia or bronchitis       Review of Systems  Constitutional:  Positive for malaise/fatigue. Negative for chills and fever.  HENT:  Positive for congestion.   Respiratory:  Positive for cough, sputum production and shortness of breath.   Cardiovascular:  Negative for chest pain.  Neurological:  Positive for headaches.      Objective:     BP (!) 150/70   Pulse (!) 58   Temp 98.2 F (36.8 C)   Ht 5\' 3"  (1.6 m)   Wt 135 lb 0.6 oz (61.3 kg)   SpO2 97%   BMI 23.92 kg/m  BP Readings from Last 3 Encounters:  12/06/23 (!) 150/70  11/28/23 (!) 151/76  11/06/23 (!) 138/54       Physical Exam Vitals reviewed.  Constitutional:      General: She is not in acute distress.    Appearance: Normal appearance. She is not ill-appearing, toxic-appearing or diaphoretic.  HENT:     Head: Normocephalic.     Nose: Congestion and rhinorrhea present.     Mouth/Throat:     Pharynx: Posterior oropharyngeal erythema present.  Eyes:     General:        Right eye: No discharge.        Left eye: No discharge.     Conjunctiva/sclera: Conjunctivae normal.  Cardiovascular:     Rate and Rhythm: Normal rate.     Pulses: Normal pulses.     Heart sounds: Normal heart sounds.  Pulmonary:     Effort: Pulmonary effort is normal. No respiratory distress.     Breath sounds: Wheezing present.  Musculoskeletal:        General: Normal range of motion.  Skin:    General: Skin is warm and dry.     Capillary Refill: Capillary refill takes less than 2 seconds.  Neurological:     General: No focal deficit present.     Mental Status: She is alert.  Psychiatric:        Mood and Affect: Mood normal.        Behavior: Behavior normal.      No results found for any visits on 12/06/23.  The 10-year ASCVD risk score (Arnett DK, et al., 2019) is: 29.6%    Assessment & Plan:  Upper respiratory tract infection, unspecified type Assessment & Plan: Augmentin 875-125 mg twice daily x 7 days, Xyzal 5 mg  Advise patient to rest to support your body's recovery. Stay hydrated by drinking water, tea, or broth. Using a humidifier can help soothe throat irritation and ease nasal congestion. For fever or pain, acetaminophen (Tylenol) is recommended. To relieve other symptoms, try saline nasal sprays, throat lozenges, or gargling with saltwater. Focus on eating light, healthy meals like fruits and vegetables to keep your strength up. Practice good hygiene by washing your hands frequently and covering your mouth when coughing or sneezing.Follow-up for worsening or persistent symptoms. Patient verbalizes  understanding regarding plan of care and all questions answered    Orders: -     Amoxicillin-Pot Clavulanate; Take 1 tablet by mouth 2 (two) times daily for 7 days.  Dispense: 14 tablet; Refill: 0  Other orders -     Levocetirizine Dihydrochloride; Take 1 tablet (5 mg total) by mouth every evening.  Dispense: 30 tablet; Refill: 0    Return if symptoms worsen or fail to improve.   Cruzita Lederer Newman Nip, FNP

## 2023-12-06 ENCOUNTER — Ambulatory Visit (INDEPENDENT_AMBULATORY_CARE_PROVIDER_SITE_OTHER): Payer: Medicare HMO | Admitting: Family Medicine

## 2023-12-06 ENCOUNTER — Encounter: Payer: Self-pay | Admitting: Family Medicine

## 2023-12-06 VITALS — BP 150/70 | HR 58 | Temp 98.2°F | Ht 63.0 in | Wt 135.0 lb

## 2023-12-06 DIAGNOSIS — J069 Acute upper respiratory infection, unspecified: Secondary | ICD-10-CM | POA: Diagnosis not present

## 2023-12-06 MED ORDER — LEVOCETIRIZINE DIHYDROCHLORIDE 5 MG PO TABS
5.0000 mg | ORAL_TABLET | Freq: Every evening | ORAL | 0 refills | Status: DC
Start: 2023-12-06 — End: 2023-12-30

## 2023-12-06 MED ORDER — AMOXICILLIN-POT CLAVULANATE 875-125 MG PO TABS
1.0000 | ORAL_TABLET | Freq: Two times a day (BID) | ORAL | 0 refills | Status: AC
Start: 2023-12-06 — End: 2023-12-13

## 2023-12-06 NOTE — Assessment & Plan Note (Signed)
Augmentin 875-125 mg twice daily x 7 days, Xyzal 5 mg  Advise patient to rest to support your body's recovery. Stay hydrated by drinking water, tea, or broth. Using a humidifier can help soothe throat irritation and ease nasal congestion. For fever or pain, acetaminophen (Tylenol) is recommended. To relieve other symptoms, try saline nasal sprays, throat lozenges, or gargling with saltwater. Focus on eating light, healthy meals like fruits and vegetables to keep your strength up. Practice good hygiene by washing your hands frequently and covering your mouth when coughing or sneezing.Follow-up for worsening or persistent symptoms. Patient verbalizes understanding regarding plan of care and all questions answered

## 2023-12-29 ENCOUNTER — Other Ambulatory Visit: Payer: Self-pay | Admitting: Family Medicine

## 2024-01-08 LAB — PROTIME-INR
INR: 1.5 — ABNORMAL HIGH (ref 0.9–1.2)
Prothrombin Time: 16.1 s — ABNORMAL HIGH (ref 9.1–12.0)

## 2024-01-08 LAB — AFP TUMOR MARKER: AFP, Serum, Tumor Marker: 2.8 ng/mL (ref 0.0–9.2)

## 2024-01-09 ENCOUNTER — Encounter: Payer: Self-pay | Admitting: Internal Medicine

## 2024-01-09 ENCOUNTER — Ambulatory Visit (INDEPENDENT_AMBULATORY_CARE_PROVIDER_SITE_OTHER): Payer: Medicare Other | Admitting: Internal Medicine

## 2024-01-09 VITALS — BP 124/86 | HR 60 | Ht 63.0 in | Wt 139.6 lb

## 2024-01-09 DIAGNOSIS — K7460 Unspecified cirrhosis of liver: Secondary | ICD-10-CM

## 2024-01-09 DIAGNOSIS — I10 Essential (primary) hypertension: Secondary | ICD-10-CM

## 2024-01-09 DIAGNOSIS — R61 Generalized hyperhidrosis: Secondary | ICD-10-CM | POA: Insufficient documentation

## 2024-01-09 DIAGNOSIS — I8510 Secondary esophageal varices without bleeding: Secondary | ICD-10-CM | POA: Diagnosis not present

## 2024-01-09 DIAGNOSIS — I700 Atherosclerosis of aorta: Secondary | ICD-10-CM

## 2024-01-09 DIAGNOSIS — E559 Vitamin D deficiency, unspecified: Secondary | ICD-10-CM

## 2024-01-09 DIAGNOSIS — E538 Deficiency of other specified B group vitamins: Secondary | ICD-10-CM

## 2024-01-09 DIAGNOSIS — E782 Mixed hyperlipidemia: Secondary | ICD-10-CM | POA: Diagnosis not present

## 2024-01-09 DIAGNOSIS — F331 Major depressive disorder, recurrent, moderate: Secondary | ICD-10-CM

## 2024-01-09 DIAGNOSIS — Z794 Long term (current) use of insulin: Secondary | ICD-10-CM

## 2024-01-09 DIAGNOSIS — R188 Other ascites: Secondary | ICD-10-CM

## 2024-01-09 DIAGNOSIS — E1169 Type 2 diabetes mellitus with other specified complication: Secondary | ICD-10-CM

## 2024-01-09 DIAGNOSIS — Z0001 Encounter for general adult medical examination with abnormal findings: Secondary | ICD-10-CM | POA: Diagnosis not present

## 2024-01-09 MED ORDER — FUROSEMIDE 40 MG PO TABS: 40.0000 mg | ORAL_TABLET | Freq: Every day | ORAL | 1 refills | Status: DC

## 2024-01-09 MED ORDER — FLUOXETINE HCL 40 MG PO CAPS: 40.0000 mg | ORAL_CAPSULE | Freq: Every day | ORAL | 1 refills | Status: DC

## 2024-01-09 NOTE — Assessment & Plan Note (Signed)
Physical exam as documented. Fasting blood tests ordered. 

## 2024-01-09 NOTE — Patient Instructions (Addendum)
Please stop taking additional dose of Prozac 20 mg. Continue taking Prozac 40 mg once daily.  Please continue to take medications as prescribed.  Please continue to follow low carb diet and perform moderate exercise/walking at least 150 mins/week.  Please consider getting Shingrix vaccine at local pharmacy.

## 2024-01-09 NOTE — Assessment & Plan Note (Signed)
On Crestor 5 mg QD QOD Check lipid profile

## 2024-01-09 NOTE — Assessment & Plan Note (Signed)
BP Readings from Last 1 Encounters:  01/09/24 124/86   Well controlled with Coreg and Lasix Had given Lasix 20 mg daily and spironolactone 25 mg daily to help with cirrhosis related ascites, but she has stopped spironolactone due to myalgias - increased Lasix to 40 mg QD for leg swelling and ascites Counseled for compliance with the medications Advised DASH diet and moderate exercise/walking, at least 150 mins/week

## 2024-01-09 NOTE — Assessment & Plan Note (Addendum)
Lab Results  Component Value Date   HGBA1C 11.7 (H) 09/23/2023   Associated with HTN and HLD On Glipizide 10 mg BID and Tresiba 10 U BID Uncontrolled due to inconsistent diet Did not tolerate Metformin and Januvia in the past Considering her history of liver cirrhosis and chronic nausea, would avoid GLP-1 agonist therapy Strictly advised to check blood glucose regularly and contact if blood glucose more than 300 or less than 70  She would benefit from CGM - advised to contact insurance for preferred DME Gvoke hypopen prescribed for hypoglycemia Advised to follow diabetic diet On ACEi and statin F/u CMP and HbA1c Diabetic eye exam: Advised to follow up with Ophthalmology for diabetic eye exam

## 2024-01-09 NOTE — Assessment & Plan Note (Signed)
On Crestor BP WNL now 

## 2024-01-09 NOTE — Assessment & Plan Note (Signed)
Denies other B symptoms such as fever, chills, chronic cough, hemoptysis, LAD Likely due to recent increase in dose of Prozac Decreased dose of Prozac

## 2024-01-09 NOTE — Assessment & Plan Note (Signed)
Had paracentesis (05/24) - 2.4 liters of clear fluid removed Has recurrence of ascites, discussed about portacaval shunt versus repeated paracentesis with GI  Last EGD showed portal hypertensive gastropathy and esophageal varices On B-blocker for ppx On Lasix to 40 mg QD

## 2024-01-09 NOTE — Assessment & Plan Note (Addendum)
Recent EGD showed portal hypertensive gastropathy and esophageal varices On B-blocker for ppx On Lasix 40 mg once daily, did not tolerate spironolactone 25 mg QD

## 2024-01-09 NOTE — Assessment & Plan Note (Signed)
Flowsheet Row Office Visit from 01/09/2024 in [facility]  PHQ-9 Total Score 0      Overall better controlled now Due to excessive sweating, decreased dose of Prozac to 40 mg once daily -concern for serotonin syndrome Her major concern is that her husband remains busy with his cars and does not spend time with her.  No current concern of physical or verbal abuse.  Had lengthy discussion to find alternative activities, she denies BH therapy as she wants her husband to get therapy  Continue Prozac 40 mg QD for now and Seroquel 50 mg qHS

## 2024-01-09 NOTE — Progress Notes (Signed)
Established Patient Office Visit  Subjective:  Patient ID: [name] [name], female    DOB: 1951/12/07  Age: 73 y.o. MRN: [id number]  CC:  Chief Complaint  Patient presents with   Annual Exam    Annual exam and 2 month f/u, reports hot flashes for 6 months, worsens at night time on and off.     HPI [name] [name] is a 73 y.o. female with past medical history of NASH related liver cirrhosis, portal hypertensive gastropathy, esophageal varices, HTN, AS, DM, HLD, MGUS, iron deficiency anemia, depression and anxiety who presents for annual physical.  DM: Her HbA1c had increased to 11.7 (10/24) from 8.7.  She is taking glipizide 10 mg BID, but admits that she still needs to improve her diet.  She did did not tolerate metformin and Januvia in the past.  She has started taking Toujeo 10 units BID for the last 2 months.  Her blood glucose levels have improved now, but still around 200 mostly.  She denies any episode of hypoglycemia since decreasing dose of Tresiba to 10 units.  Denies any polyuria or polyphagia currently.  HTN: Her BP is well controlled today. She has been placed on Lasix  considering her history of liver cirrhosis and ascites, but does not take spironolactone due to myalgias.  She denies any headache, dizziness, chest pain, dyspnea or palpitations.  NASH related liver cirrhosis: She has recurrent ascites, had paracentesis on 05/19 -about 2.4 L fluid was removed.  Of note, she has not been taking spironolactone and takes only 40 mg of Lasix currently.  MDD: She is currently taking Prozac 60 mg daily for it, dose was recently increased from 40 mg.  She reports excessive sweating for the last 6 months.  She was placed on Seroquel 50 mg qHS for resistant depression and insomnia.  She still complains of apathy, lack of interest in routine activities and lack of appetite.  She still complains of anhedonia and agitation, but denies crying spells.  She also has spells of anxiety due to  arguments with her husband. She denies SI or HI currently.  She has tried Zoloft, Lexapro, Effexor and Rexulti before.     Past Medical History:  Diagnosis Date   Allergy    Anemia, unspecified    Anxiety    Arthritis    Chronic diarrhea    Cirrhosis (HCC)    Depression    Depression    Elevated cholesterol    Enterocolitis due to Clostridium difficile, not specified as recurrent    Gastro-esophageal reflux disease with esophagitis    Helicobacter pylori gastritis 07/10/2017   Dx EGD -  1) Omeprazole 20 mg 2 times a day x 14 d 2) Pepto Bismol 2 tabs (262 mg each) 4 times a day x 14 d 3) Metronidazole 250 mg 4 times a day x 14 d 4) doxycycline 100 mg 2 times a day x 14 d  After 14 d stop omeprazole also  In 4 weeks after treatment completed do H. Pylori stool antigen - dx H. Pylori gastritis    Hx of adenomatous colonic polyps 10/05/2015   Hyperlipemia 02/21/2009   NUC STRESS TEST-NORMAL- EF 74%   Hypertension    Iron deficiency anemia    Irritable bowel syndrome    Localized edema    Major depressive disorder, single episode, unspecified    MVP (mitral valve prolapse)    Rectocele    Thrombocytopenia (HCC)    Type 2 diabetes mellitus with hyperglycemia (  HCC)     Past Surgical History:  Procedure Laterality Date   ABDOMINAL HYSTERECTOMY     BILATERAL SALPINGOOPHORECTOMY     carpel tunnel Bilateral 12/17/2009   COLONOSCOPY     ESOPHAGOGASTRODUODENOSCOPY     IR PARACENTESIS  05/03/2022   IR RADIOLOGIST EVAL & MGMT  11/12/2022   TUBAL LIGATION      Family History  Problem Relation Age of Onset   Anxiety disorder Mother    ADD / ADHD Brother    Alcohol abuse Brother        x 3   Drug abuse Brother    Anxiety disorder Maternal Aunt    Anxiety disorder Maternal Grandmother    Bipolar disorder Other    Emphysema Father    Diabetes Sister    Hyperlipidemia Sister    Cirrhosis Sister    Hyperlipidemia Sister    Diabetes Sister    Diabetes Maternal Uncle    Dementia  Neg Hx    OCD Neg Hx    Paranoid behavior Neg Hx    Physical abuse Neg Hx    Schizophrenia Neg Hx    Seizures Neg Hx    Sexual abuse Neg Hx    Colon cancer Neg Hx    Colon polyps Neg Hx    Pancreatic cancer Neg Hx    Esophageal cancer Neg Hx    Stomach cancer Neg Hx    Kidney disease Neg Hx    Liver disease Neg Hx    Rectal cancer Neg Hx     Social History   Socioeconomic History   Marital status: Married    Spouse name: Harvie Heck   Number of children: 2   Years of education: Not on file   Highest education level: Not on file  Occupational History   Not on file  Tobacco Use   Smoking status: Former    Current packs/day: 0.00    Types: Cigarettes    Start date: 09/04/1967    Quit date: 09/03/1997    Years since quitting: 26.3    Passive exposure: Past   Smokeless tobacco: Former    Types: Snuff    Quit date: 05/2022   Tobacco comments:    call when ready to quit    06/14/2022 patient stated she quit 5 or so weeks ago  Vaping Use   Vaping status: Never Used  Substance and Sexual Activity   Alcohol use: No    Alcohol/week: 0.0 standard drinks of alcohol   Drug use: No   Sexual activity: Not on file  Other Topics Concern   Not on file  Social History Narrative   Married 2 sons 1 lives in [place] 1 lives in [place]   She is a housewife   No alcohol tobacco or drug use at this time   Former smoker quit in 1998   Social Drivers of Health   Financial Resource Strain: Low Risk  (11/20/2023)   Overall Financial Resource Strain (CARDIA)    Difficulty of Paying Living Expenses: Not hard at all  Food Insecurity: No Food Insecurity (11/20/2023)   Hunger Vital Sign    Worried About Running Out of Food in the Last Year: Never true    Ran Out of Food in the Last Year: Never true  Transportation Needs: No Transportation Needs (11/20/2023)   PRAPARE - Administrator, Civil Service (Medical): No    Lack of Transportation (Non-Medical): No  Physical  Activity: Inactive (11/20/2023)   Exercise  Vital Sign    Days of Exercise per Week: 0 days    Minutes of Exercise per Session: 0 min  Stress: No Stress Concern Present (11/20/2023)   [employer] of Occupational Health - Occupational Stress Questionnaire    Feeling of Stress : Not at all  Social Connections: Socially Integrated (11/20/2023)   Social Connection and Isolation Panel [NHANES]    Frequency of Communication with Friends and Family: More than three times a week    Frequency of Social Gatherings with Friends and Family: More than three times a week    Attends Religious Services: More than 4 times per year    Active Member of [employer] or Organizations: Yes    Attends Engineer, structural: More than 4 times per year    Marital Status: Married  Catering manager Violence: Not At Risk (11/20/2023)   Humiliation, Afraid, Rape, and Kick questionnaire    Fear of Current or Ex-Partner: No    Emotionally Abused: No    Physically Abused: No    Sexually Abused: No    Outpatient Medications Prior to Visit  Medication Sig Dispense Refill   baclofen (LIORESAL) 10 MG tablet TAKE 1 TABLET BY MOUTH TWICE A DAY AS NEEDED FOR MUSCLE SPASMS 180 tablet 1   BD PEN NEEDLE NANO 2ND GEN 32G X 4 MM MISC USE AS DIRECTED 4 TIMES A DAY 100 each 5   BIOTIN PO Take 500 mg by mouth daily.     Blood Glucose Monitoring Suppl DEVI 1 each by Does not apply route in the morning, at noon, and at bedtime. May substitute to any manufacturer covered by patient's insurance. 1 each 0   carvedilol (COREG) 25 MG tablet TAKE 1 TABLET BY MOUTH TWICE A DAY 180 tablet 3   ferrous sulfate 325 (65 FE) MG EC tablet Take 325 mg by mouth 2 (two) times daily. One daily     glipiZIDE (GLUCOTROL) 10 MG tablet TAKE 1 TABLET (10 MG TOTAL) BY MOUTH TWICE A DAY BEFORE A MEAL 180 tablet 1   Glucagon (GVOKE HYPOPEN 2-PACK) 1 MG/0.2ML SOAJ Inject 1 mg into the skin as needed (For blood glucose < 53). 0.4 mL 5   Glucose Blood (BLOOD  GLUCOSE TEST STRIPS) STRP 1 each by In Vitro route in the morning, at noon, and at bedtime. May substitute to any manufacturer covered by patient's insurance. 100 strip 3   insulin glargine, 2 Unit Dial, (TOUJEO MAX SOLOSTAR) 300 UNIT/ML Solostar Pen Inject 20 Units into the skin every evening. 6 mL 0   ondansetron (ZOFRAN) 4 MG tablet Take 1 tablet (4 mg total) by mouth every 8 (eight) hours as needed for nausea or vomiting. 20 tablet 0   QUEtiapine (SEROQUEL) 50 MG tablet TAKE 1 TABLET BY MOUTH EVERYDAY AT BEDTIME 90 tablet 1   rosuvastatin (CRESTOR) 5 MG tablet TAKE 1 TABLET (5 MG TOTAL) BY MOUTH DAILY. TAKES ONE ON MONDAY, WED., AND FRIDAY 45 tablet 1   spironolactone (ALDACTONE) 25 MG tablet Take 1 tablet (25 mg total) by mouth daily. 90 tablet 3   triamcinolone cream (KENALOG) 0.1 % APPLY TO AFFECTED AREA TWICE A DAY 30 g 0   FLUoxetine (PROZAC) 20 MG capsule Take 1 capsule (20 mg total) by mouth daily. Take in addition to 40 mg to total 60 mg once daily. 90 capsule 1   FLUoxetine (PROZAC) 40 MG capsule Take 1 capsule (40 mg total) by mouth daily. Take in addition to 20 mg to  total 60 mg once daily. 90 capsule 1   furosemide (LASIX) 40 MG tablet Take 1 tablet (40 mg total) by mouth daily. 90 tablet 1   levocetirizine (XYZAL) 5 MG tablet TAKE 1 TABLET BY MOUTH EVERY DAY IN THE EVENING 90 tablet 1   No facility-administered medications prior to visit.    Allergies  Allergen Reactions   Codeine Other (See Comments)    Took it years ago and doesn't remember the reaction   Spironolactone Other (See Comments)    Reports intolerance , states unable to take the medication    ROS Review of Systems  Constitutional:  Positive for fatigue. Negative for chills and fever.  HENT:  Negative for congestion, sinus pressure, sinus pain and sore throat.   Eyes:  Negative for pain and discharge.  Respiratory:  Negative for cough and shortness of breath.   Cardiovascular:  Negative for chest pain and  palpitations.  Gastrointestinal:  Negative for diarrhea, nausea and vomiting.  Endocrine: Negative for polydipsia and polyuria.  Genitourinary:  Negative for dysuria and hematuria.  Musculoskeletal:  Positive for arthralgias. Negative for neck pain and neck stiffness.  Skin:  Negative for rash.  Neurological:  Negative for dizziness and weakness.  Psychiatric/Behavioral:  Positive for dysphoric mood and sleep disturbance. Negative for agitation and behavioral problems. The patient is nervous/anxious.       Objective:    Physical Exam Vitals reviewed.  Constitutional:      General: She is not in acute distress.    Appearance: She is not diaphoretic.  HENT:     Head: Normocephalic and atraumatic.     Nose: Nose normal.     Mouth/Throat:     Mouth: Mucous membranes are moist.  Eyes:     General: No scleral icterus.    Extraocular Movements: Extraocular movements intact.  Cardiovascular:     Rate and Rhythm: Normal rate and regular rhythm.     Pulses: Normal pulses.     Heart sounds: Murmur (Systolic over left upper sternal border) heard.  Pulmonary:     Breath sounds: Normal breath sounds. No wheezing or rales.  Abdominal:     General: There is no distension.     Palpations: Abdomen is soft.  Musculoskeletal:     Right shoulder: No swelling. Decreased range of motion.     Cervical back: Neck supple. No tenderness.     Right lower leg: No edema.     Left lower leg: No edema.  Skin:    General: Skin is warm.     Findings: No rash.  Neurological:     General: No focal deficit present.     Mental Status: She is alert and oriented to person, place, and time.  Psychiatric:        Mood and Affect: Mood is depressed.        Behavior: Behavior normal.     BP 124/86 (BP Location: Left Arm)   Pulse 60   Ht 5\' 3"  (1.6 m)   Wt 139 lb 9.6 oz (63.3 kg)   SpO2 95%   BMI 24.73 kg/m  Wt Readings from Last 3 Encounters:  01/09/24 139 lb 9.6 oz (63.3 kg)  12/06/23 135 lb 0.6 oz  (61.3 kg)  11/28/23 138 lb 1.6 oz (62.6 kg)    Lab Results  Component Value Date   TSH 2.760 04/29/2023   Lab Results  Component Value Date   WBC 4.1 09/23/2023   HGB 10.5 (L) 09/23/2023  HCT 32.9 (L) 09/23/2023   MCV 80 09/23/2023   PLT 101 (L) 09/23/2023   Lab Results  Component Value Date   NA 139 09/23/2023   K 4.7 09/23/2023   CO2 24 09/23/2023   GLUCOSE 173 (H) 09/23/2023   BUN 20 09/23/2023   CREATININE 1.22 (H) 09/23/2023   BILITOT 0.6 09/23/2023   ALKPHOS 75 09/23/2023   AST 20 09/23/2023   ALT 7 09/23/2023   PROT 7.8 09/23/2023   ALBUMIN 3.8 (L) 09/23/2023   CALCIUM 9.3 09/23/2023   ANIONGAP 7 11/29/2021   EGFR 48 (L) 09/23/2023   GFR 48.42 (L) 04/25/2022   Lab Results  Component Value Date   CHOL 163 04/29/2023   Lab Results  Component Value Date   HDL 45 04/29/2023   Lab Results  Component Value Date   LDLCALC 101 (H) 04/29/2023   Lab Results  Component Value Date   TRIG 94 04/29/2023   Lab Results  Component Value Date   CHOLHDL 3.6 04/29/2023   Lab Results  Component Value Date   HGBA1C 11.7 (H) 09/23/2023      Assessment & Plan:   Problem List Items Addressed This Visit       Cardiovascular and Mediastinum   Essential hypertension   BP Readings from Last 1 Encounters:  01/09/24 124/86   Well controlled with Coreg and Lasix Had given Lasix 20 mg daily and spironolactone 25 mg daily to help with cirrhosis related ascites, but she has stopped spironolactone due to myalgias - increased Lasix to 40 mg QD for leg swelling and ascites Counseled for compliance with the medications Advised DASH diet and moderate exercise/walking, at least 150 mins/week      Relevant Medications   furosemide (LASIX) 40 MG tablet   Other Relevant Orders   TSH   CMP14+EGFR   CBC with Differential/Platelet   Secondary esophageal varices without bleeding (HCC)   Recent EGD showed portal hypertensive gastropathy and esophageal varices On B-blocker  for ppx On Lasix 40 mg once daily, did not tolerate spironolactone 25 mg QD      Relevant Medications   furosemide (LASIX) 40 MG tablet   Atherosclerosis of aorta (HCC)   On Crestor BP WNL now      Relevant Medications   furosemide (LASIX) 40 MG tablet   Other Relevant Orders   Lipid panel     Digestive   Cirrhosis of liver with ascites (HCC)   Had paracentesis (05/24) - 2.4 liters of clear fluid removed Has recurrence of ascites, discussed about portacaval shunt versus repeated paracentesis with GI  Last EGD showed portal hypertensive gastropathy and esophageal varices On B-blocker for ppx On Lasix to 40 mg QD      Relevant Medications   furosemide (LASIX) 40 MG tablet   Other Relevant Orders   CMP14+EGFR     Endocrine   Type 2 diabetes mellitus with other specified complication (HCC)   Lab Results  Component Value Date   HGBA1C 11.7 (H) 09/23/2023   Associated with HTN and HLD On Glipizide 10 mg BID and Tresiba 10 U BID Uncontrolled due to inconsistent diet Did not tolerate Metformin and Januvia in the past Considering her history of liver cirrhosis and chronic nausea, would avoid GLP-1 agonist therapy Strictly advised to check blood glucose regularly and contact if blood glucose more than 300 or less than 70  She would benefit from CGM - advised to contact insurance for preferred DME Gvoke hypopen prescribed for hypoglycemia Advised  to follow diabetic diet On ACEi and statin F/u CMP and HbA1c Diabetic eye exam: Advised to follow up with Ophthalmology for diabetic eye exam      Relevant Orders   Hemoglobin A1c   CMP14+EGFR     Other   Mixed hyperlipidemia   On Crestor 5 mg QD QOD Check lipid profile      Relevant Medications   furosemide (LASIX) 40 MG tablet   Other Relevant Orders   Lipid panel   Major depressive disorder, recurrent episode, moderate (HCC)   Flowsheet Row Office Visit from 01/09/2024 in [facility]  PHQ-9  Total Score 0      Overall better controlled now Due to excessive sweating, decreased dose of Prozac to 40 mg once daily -concern for serotonin syndrome Her major concern is that her husband remains busy with his cars and does not spend time with her.  No current concern of physical or verbal abuse.  Had lengthy discussion to find alternative activities, she denies BH therapy as she wants her husband to get therapy  Continue Prozac 40 mg QD for now and Seroquel 50 mg qHS      Relevant Medications   FLUoxetine (PROZAC) 40 MG capsule   Encounter for general adult medical examination with abnormal findings - Primary   Physical exam as documented. Fasting blood tests ordered.      Excessive sweating   Denies other B symptoms such as fever, chills, chronic cough, hemoptysis, LAD Likely due to recent increase in dose of Prozac Decreased dose of Prozac      Other Visit Diagnoses       Vitamin D deficiency       Relevant Orders   VITAMIN D 25 Hydroxy (Vit-D Deficiency, Fractures)     B12 deficiency       Relevant Orders   B12        Meds ordered this encounter  Medications   FLUoxetine (PROZAC) 40 MG capsule    Sig: Take 1 capsule (40 mg total) by mouth daily.    Dispense:  90 capsule    Refill:  1    Please cancel Prozac 20 mg.   furosemide (LASIX) 40 MG tablet    Sig: Take 1 tablet (40 mg total) by mouth daily.    Dispense:  90 tablet    Refill:  1    Follow-up: Return in about 3 months (around 04/08/2024) for DM.    Anabel Halon, MD

## 2024-01-10 LAB — CMP14+EGFR
ALT: 11 [IU]/L (ref 0–32)
AST: 29 [IU]/L (ref 0–40)
Albumin: 3.7 g/dL — ABNORMAL LOW (ref 3.9–4.9)
Alkaline Phosphatase: 76 [IU]/L (ref 44–121)
BUN/Creatinine Ratio: 17 (ref 12–28)
BUN: 19 mg/dL (ref 8–27)
Bilirubin Total: 0.6 mg/dL (ref 0.0–1.2)
CO2: 23 mmol/L (ref 20–29)
Calcium: 9.1 mg/dL (ref 8.7–10.3)
Chloride: 102 mmol/L (ref 96–106)
Creatinine, Ser: 1.09 mg/dL — ABNORMAL HIGH (ref 0.57–1.00)
Globulin, Total: 3.8 g/dL (ref 1.5–4.5)
Glucose: 152 mg/dL — ABNORMAL HIGH (ref 70–99)
Potassium: 4.9 mmol/L (ref 3.5–5.2)
Sodium: 139 mmol/L (ref 134–144)
Total Protein: 7.5 g/dL (ref 6.0–8.5)
eGFR: 55 mL/min/{1.73_m2} — ABNORMAL LOW (ref >59)

## 2024-01-10 LAB — CBC WITH DIFFERENTIAL/PLATELET
Basophils Absolute: 0 10*3/uL (ref 0.0–0.2)
Basos: 1 %
EOS (ABSOLUTE): 0 10*3/uL (ref 0.0–0.4)
Eos: 0 %
Hematocrit: 33.9 % — ABNORMAL LOW (ref 34.0–46.6)
Hemoglobin: 11.4 g/dL (ref 11.1–15.9)
Immature Grans (Abs): 0 10*3/uL (ref 0.0–0.1)
Immature Granulocytes: 0 %
Lymphocytes Absolute: 0.5 10*3/uL — ABNORMAL LOW (ref 0.7–3.1)
Lymphs: 14 %
MCH: 27.5 pg (ref 26.6–33.0)
MCHC: 33.6 g/dL (ref 31.5–35.7)
MCV: 82 fL (ref 79–97)
Monocytes Absolute: 0.3 10*3/uL (ref 0.1–0.9)
Monocytes: 9 %
Neutrophils Absolute: 2.8 10*3/uL (ref 1.4–7.0)
Neutrophils: 76 %
Platelets: 102 10*3/uL — ABNORMAL LOW (ref 150–450)
RBC: 4.15 x10E6/uL (ref 3.77–5.28)
RDW: 15.4 % (ref 11.7–15.4)
WBC: 3.7 10*3/uL (ref 3.4–10.8)

## 2024-01-10 LAB — VITAMIN B12: Vitamin B-12: 576 pg/mL (ref 232–1245)

## 2024-01-10 LAB — HEMOGLOBIN A1C
Est. average glucose Bld gHb Est-mCnc: 151 mg/dL
Hgb A1c MFr Bld: 6.9 % — ABNORMAL HIGH (ref 4.8–5.6)

## 2024-01-10 LAB — LIPID PANEL
Chol/HDL Ratio: 3.9 {ratio} (ref 0.0–4.4)
Cholesterol, Total: 175 mg/dL (ref 100–199)
HDL: 45 mg/dL (ref >39)
LDL Chol Calc (NIH): 110 mg/dL — ABNORMAL HIGH (ref 0–99)
Triglycerides: 113 mg/dL (ref 0–149)
VLDL Cholesterol Cal: 20 mg/dL (ref 5–40)

## 2024-01-10 LAB — VITAMIN D 25 HYDROXY (VIT D DEFICIENCY, FRACTURES): Vit D, 25-Hydroxy: 41.2 ng/mL (ref 30.0–100.0)

## 2024-01-10 LAB — TSH: TSH: 2.46 u[IU]/mL (ref 0.450–4.500)

## 2024-01-15 ENCOUNTER — Other Ambulatory Visit: Payer: Self-pay | Admitting: Internal Medicine

## 2024-01-15 DIAGNOSIS — E1169 Type 2 diabetes mellitus with other specified complication: Secondary | ICD-10-CM

## 2024-01-24 ENCOUNTER — Encounter: Payer: Medicare HMO | Admitting: Internal Medicine

## 2024-02-07 ENCOUNTER — Other Ambulatory Visit: Payer: Self-pay | Admitting: Internal Medicine

## 2024-02-07 DIAGNOSIS — F331 Major depressive disorder, recurrent, moderate: Secondary | ICD-10-CM

## 2024-02-20 ENCOUNTER — Other Ambulatory Visit: Payer: Self-pay | Admitting: Internal Medicine

## 2024-02-20 DIAGNOSIS — E1169 Type 2 diabetes mellitus with other specified complication: Secondary | ICD-10-CM

## 2024-03-23 ENCOUNTER — Encounter: Payer: Self-pay | Admitting: Internal Medicine

## 2024-04-09 ENCOUNTER — Ambulatory Visit (INDEPENDENT_AMBULATORY_CARE_PROVIDER_SITE_OTHER): Payer: Medicare Other | Admitting: Internal Medicine

## 2024-04-09 ENCOUNTER — Encounter: Payer: Self-pay | Admitting: Internal Medicine

## 2024-04-09 VITALS — BP 138/82 | HR 62 | Ht 63.0 in | Wt 135.2 lb

## 2024-04-09 DIAGNOSIS — I10 Essential (primary) hypertension: Secondary | ICD-10-CM | POA: Diagnosis not present

## 2024-04-09 DIAGNOSIS — E1169 Type 2 diabetes mellitus with other specified complication: Secondary | ICD-10-CM | POA: Diagnosis not present

## 2024-04-09 DIAGNOSIS — F331 Major depressive disorder, recurrent, moderate: Secondary | ICD-10-CM

## 2024-04-09 DIAGNOSIS — K7460 Unspecified cirrhosis of liver: Secondary | ICD-10-CM

## 2024-04-09 DIAGNOSIS — Z794 Long term (current) use of insulin: Secondary | ICD-10-CM

## 2024-04-09 DIAGNOSIS — R188 Other ascites: Secondary | ICD-10-CM

## 2024-04-09 MED ORDER — AMLODIPINE BESYLATE 5 MG PO TABS: 5.0000 mg | ORAL_TABLET | Freq: Every day | ORAL | 3 refills | Status: DC

## 2024-04-09 NOTE — Assessment & Plan Note (Addendum)
 Lab Results  Component Value Date   HGBA1C 6.9 (H) 01/09/2024   Associated with HTN and HLD On Glipizide  10 mg BID and Tresiba 10 U QD (instead of prescribed BID) - check HbA1c Well-controlled, but has been uncontrolled in the past due to inconsistent diet Did not tolerate Metformin  and Januvia  in the past Considering her history of liver cirrhosis and chronic nausea, would avoid GLP-1 agonist therapy Strictly advised to check blood glucose regularly and contact if blood glucose more than 300 or less than 70  She would benefit from CGM, but she declines it Gvoke hypopen  prescribed for hypoglycemia Advised to follow diabetic diet On ACEi and statin F/u CMP and HbA1c Diabetic eye exam: Advised to follow up with Ophthalmology for diabetic eye exam

## 2024-04-09 NOTE — Assessment & Plan Note (Addendum)
 Flowsheet Row Office Visit from 04/09/2024 in [facility]  PHQ-9 Total Score 0      Overall better controlled now Continue Seroquel  50 mg at bedtime DC Prozac  as she had excessive sweating - concern for serotonin syndrome Her major concern is that her husband remains busy with his cars and does not spend time with her.  No current concern of physical or verbal abuse.  Had lengthy discussion to find alternative activities, she denies BH therapy as she wants her husband to get therapy

## 2024-04-09 NOTE — Assessment & Plan Note (Signed)
 Had paracentesis (05/24) - 2.4 liters of clear fluid removed Has recurrence of ascites, discussed about portacaval shunt versus repeated paracentesis with GI  Last EGD showed portal hypertensive gastropathy and esophageal varices On B-blocker for ppx On Lasix  40 mg QD, did not tolerate spironolactone 

## 2024-04-09 NOTE — Assessment & Plan Note (Addendum)
 BP Readings from Last 1 Encounters:  04/09/24 138/82   Uncontrolled with Coreg  25 mg BID and Lasix  40 mg QD On Lasix  40 mg daily for cirrhosis related ascites, but she has stopped spironolactone  due to myalgias Added Amlodipine  5 mg QD Counseled for compliance with the medications Advised DASH diet and moderate exercise/walking, at least 150 mins/week

## 2024-04-09 NOTE — Patient Instructions (Signed)
 Please start taking amlodipine  as prescribed for blood pressure.  Please continue taking carvedilol  and Lasix  as prescribed.  Please check blood glucose regularly before meals and keep a log of it.  Please continue to take other medications as prescribed.  Please continue to follow low carb diet and perform moderate exercise/walking as tolerated.

## 2024-04-09 NOTE — Progress Notes (Signed)
 Established Patient Office Visit  Subjective:  Patient ID: [name] [name], female    DOB: 05/30/1951  Age: 73 y.o. MRN: [id number]  CC:  Chief Complaint  Patient presents with   Medical Management of Chronic Issues    3 month f/u    HPI [name] [name] is a 73 y.o. female with past medical history of NASH related liver cirrhosis, portal hypertensive gastropathy, esophageal varices, HTN, AS, DM, HLD, MGUS, iron deficiency anemia, depression and anxiety who presents for annual physical.  DM: Her HbA1c had improved to 6.9 in 01/25. She is taking glipizide  10 mg BID and she has started taking Toujeo  10 units QD instead of BID for the last 1 month. She admits that she still needs to improve her diet.  She did did not tolerate metformin  and Januvia  in the past.   Her blood glucose levels have improved now, but has not been taking regularly.  She denies any episode of hypoglycemia since decreasing dose of Tresiba to 10 units.  Denies any polyuria or polyphagia currently.  HTN: Her BP is elevated today. She has been placed on Lasix  40 mg QD and Coreg  25 mg BID considering her history of liver cirrhosis and ascites, but does not take spironolactone  due to myalgias.  She denies any headache, dizziness, chest pain, dyspnea or palpitations.  NASH related liver cirrhosis: She has recurrent ascites, had paracentesis on 05/19 -about 2.4 L fluid was removed.  Of note, she has not been taking spironolactone  and takes only 40 mg of Lasix  currently.  MDD: She has stopped taking Prozac , her excessive sweating is improved now. She was placed on Seroquel  50 mg qHS for resistant depression and insomnia.  She still complains of apathy, lack of interest in routine activities and lack of appetite, but is overall better than prior.  She still complains of anhedonia and agitation, but denies crying spells.  She also has spells of anxiety due to arguments with her husband. She denies SI or HI currently.  She has tried  Zoloft , Lexapro , Effexor  and Rexulti  before.     Past Medical History:  Diagnosis Date   Allergy    Anemia, unspecified    Anxiety    Arthritis    Chronic diarrhea    Cirrhosis (HCC)    Depression    Depression    Elevated cholesterol    Enterocolitis due to Clostridium difficile, not specified as recurrent    Gastro-esophageal reflux disease with esophagitis    Helicobacter pylori gastritis 07/10/2017   Dx EGD -  1) Omeprazole  20 mg 2 times a day x 14 d 2) Pepto Bismol 2 tabs (262 mg each) 4 times a day x 14 d 3) Metronidazole  250 mg 4 times a day x 14 d 4) doxycycline  100 mg 2 times a day x 14 d  After 14 d stop omeprazole  also  In 4 weeks after treatment completed do H. Pylori stool antigen - dx H. Pylori gastritis    Hx of adenomatous colonic polyps 10/05/2015   Hyperlipemia 02/21/2009   NUC STRESS TEST-NORMAL- EF 74%   Hypertension    Iron deficiency anemia    Irritable bowel syndrome    Localized edema    Major depressive disorder, single episode, unspecified    MVP (mitral valve prolapse)    Rectocele    Thrombocytopenia (HCC)    Type 2 diabetes mellitus with hyperglycemia (HCC)     Past Surgical History:  Procedure Laterality Date   ABDOMINAL HYSTERECTOMY  BILATERAL SALPINGOOPHORECTOMY     carpel tunnel Bilateral 12/17/2009   COLONOSCOPY     ESOPHAGOGASTRODUODENOSCOPY     IR PARACENTESIS  05/03/2022   IR RADIOLOGIST EVAL & MGMT  11/12/2022   TUBAL LIGATION      Family History  Problem Relation Age of Onset   Anxiety disorder Mother    ADD / ADHD Brother    Alcohol abuse Brother        x 3   Drug abuse Brother    Anxiety disorder Maternal Aunt    Anxiety disorder Maternal Grandmother    Bipolar disorder Other    Emphysema Father    Diabetes Sister    Hyperlipidemia Sister    Cirrhosis Sister    Hyperlipidemia Sister    Diabetes Sister    Diabetes Maternal Uncle    Dementia Neg Hx    OCD Neg Hx    Paranoid behavior Neg Hx    Physical abuse Neg Hx     Schizophrenia Neg Hx    Seizures Neg Hx    Sexual abuse Neg Hx    Colon cancer Neg Hx    Colon polyps Neg Hx    Pancreatic cancer Neg Hx    Esophageal cancer Neg Hx    Stomach cancer Neg Hx    Kidney disease Neg Hx    Liver disease Neg Hx    Rectal cancer Neg Hx     Social History   Socioeconomic History   Marital status: Married    Spouse name: Mont Antis   Number of children: 2   Years of education: Not on file   Highest education level: Not on file  Occupational History   Not on file  Tobacco Use   Smoking status: Former    Current packs/day: 0.00    Types: Cigarettes    Start date: 09/04/1967    Quit date: 09/03/1997    Years since quitting: 26.6    Passive exposure: Past   Smokeless tobacco: Former    Types: Snuff    Quit date: 05/2022   Tobacco comments:    call when ready to quit    06/14/2022 patient stated she quit 5 or so weeks ago  Vaping Use   Vaping status: Never Used  Substance and Sexual Activity   Alcohol use: No    Alcohol/week: 0.0 standard drinks of alcohol   Drug use: No   Sexual activity: Not on file  Other Topics Concern   Not on file  Social History Narrative   Married 2 sons 1 lives in [place]  1 lives in [place]   She is a housewife   No alcohol tobacco or drug use at this time   Former smoker quit in 1998   Social Drivers of Health   Financial Resource Strain: Low Risk  (11/20/2023)   Overall Financial Resource Strain (CARDIA)    Difficulty of Paying Living Expenses: Not hard at all  Food Insecurity: No Food Insecurity (11/20/2023)   Hunger Vital Sign    Worried About Running Out of Food in the Last Year: Never true    Ran Out of Food in the Last Year: Never true  Transportation Needs: No Transportation Needs (11/20/2023)   PRAPARE - Administrator, Civil Service (Medical): No    Lack of Transportation (Non-Medical): No  Physical Activity: Inactive (11/20/2023)   Exercise Vital Sign    Days of Exercise per Week:  0 days    Minutes of Exercise per Session:  0 min  Stress: No Stress Concern Present (11/20/2023)   [employer] of Occupational Health - Occupational Stress Questionnaire    Feeling of Stress : Not at all  Social Connections: Socially Integrated (11/20/2023)   Social Connection and Isolation Panel [NHANES]    Frequency of Communication with Friends and Family: More than three times a week    Frequency of Social Gatherings with Friends and Family: More than three times a week    Attends Religious Services: More than 4 times per year    Active Member of [employer] or Organizations: Yes    Attends Engineer, structural: More than 4 times per year    Marital Status: Married  Catering manager Violence: Not At Risk (11/20/2023)   Humiliation, Afraid, Rape, and Kick questionnaire    Fear of Current or Ex-Partner: No    Emotionally Abused: No    Physically Abused: No    Sexually Abused: No    Outpatient Medications Prior to Visit  Medication Sig Dispense Refill   baclofen  (LIORESAL ) 10 MG tablet TAKE 1 TABLET BY MOUTH TWICE A DAY AS NEEDED FOR MUSCLE SPASMS 180 tablet 1   BD PEN NEEDLE NANO 2ND GEN 32G X 4 MM MISC USE AS DIRECTED 4 TIMES A DAY 100 each 5   BIOTIN PO Take 500 mg by mouth daily.     Blood Glucose Monitoring Suppl DEVI 1 each by Does not apply route in the morning, at noon, and at bedtime. May substitute to any manufacturer covered by patient's insurance. 1 each 0   carvedilol  (COREG ) 25 MG tablet TAKE 1 TABLET BY MOUTH TWICE A DAY 180 tablet 3   ferrous sulfate 325 (65 FE) MG EC tablet Take 325 mg by mouth 2 (two) times daily. One daily     furosemide  (LASIX ) 40 MG tablet Take 1 tablet (40 mg total) by mouth daily. 90 tablet 1   glipiZIDE  (GLUCOTROL ) 10 MG tablet TAKE 1 TABLET(10MG  TOTAL) BY MOUTH DAILY BEFORE BREAKFAST 90 tablet 1   Glucagon  (GVOKE HYPOPEN  2-PACK) 1 MG/0.2ML SOAJ Inject 1 mg into the skin as needed (For blood glucose < 53). 0.4 mL 5   Glucose Blood (BLOOD  GLUCOSE TEST STRIPS) STRP 1 each by In Vitro route in the morning, at noon, and at bedtime. May substitute to any manufacturer covered by patient's insurance. 100 strip 3   insulin  glargine, 2 Unit Dial, (TOUJEO  MAX SOLOSTAR) 300 UNIT/ML Solostar Pen INJECT 20 UNITS INTO THE SKIN EVERY EVENING. 3 mL 0   ondansetron  (ZOFRAN ) 4 MG tablet Take 1 tablet (4 mg total) by mouth every 8 (eight) hours as needed for nausea or vomiting. 20 tablet 0   QUEtiapine  (SEROQUEL ) 50 MG tablet TAKE 1 TABLET BY MOUTH EVERYDAY AT BEDTIME 90 tablet 1   rosuvastatin  (CRESTOR ) 5 MG tablet TAKE 1 TABLET (5 MG TOTAL) BY MOUTH DAILY. TAKES ONE ON MONDAY, WED., AND FRIDAY 45 tablet 1   spironolactone  (ALDACTONE ) 25 MG tablet Take 1 tablet (25 mg total) by mouth daily. 90 tablet 3   triamcinolone  cream (KENALOG ) 0.1 % APPLY TO AFFECTED AREA TWICE A DAY 30 g 0   FLUoxetine  (PROZAC ) 40 MG capsule Take 1 capsule (40 mg total) by mouth daily. (Patient not taking: Reported on 04/09/2024) 90 capsule 1   No facility-administered medications prior to visit.    Allergies  Allergen Reactions   Codeine Other (See Comments)    Took it years ago and doesn't remember the reaction  Spironolactone  Other (See Comments)    Reports intolerance , states unable to take the medication    ROS Review of Systems  Constitutional:  Positive for fatigue. Negative for chills and fever.  HENT:  Negative for congestion, sinus pressure, sinus pain and sore throat.   Eyes:  Negative for pain and discharge.  Respiratory:  Negative for cough and shortness of breath.   Cardiovascular:  Negative for chest pain and palpitations.  Gastrointestinal:  Negative for diarrhea, nausea and vomiting.  Endocrine: Negative for polydipsia and polyuria.  Genitourinary:  Negative for dysuria and hematuria.  Musculoskeletal:  Positive for arthralgias. Negative for neck pain and neck stiffness.  Skin:  Negative for rash.  Neurological:  Negative for dizziness and  weakness.  Psychiatric/Behavioral:  Positive for dysphoric mood and sleep disturbance. Negative for agitation and behavioral problems. The patient is nervous/anxious.       Objective:    Physical Exam Vitals reviewed.  Constitutional:      General: She is not in acute distress.    Appearance: She is not diaphoretic.  HENT:     Head: Normocephalic and atraumatic.     Nose: Nose normal.     Mouth/Throat:     Mouth: Mucous membranes are moist.  Eyes:     General: No scleral icterus.    Extraocular Movements: Extraocular movements intact.  Cardiovascular:     Rate and Rhythm: Normal rate and regular rhythm.     Pulses: Normal pulses.     Heart sounds: Murmur (Systolic over left upper sternal border) heard.  Pulmonary:     Breath sounds: Normal breath sounds. No wheezing or rales.  Abdominal:     General: There is no distension.     Palpations: Abdomen is soft.  Musculoskeletal:     Right shoulder: No swelling. Decreased range of motion.     Cervical back: Neck supple. No tenderness.     Right lower leg: No edema.     Left lower leg: No edema.  Skin:    General: Skin is warm.     Findings: No rash.  Neurological:     General: No focal deficit present.     Mental Status: She is alert and oriented to person, place, and time.  Psychiatric:        Mood and Affect: Mood normal.        Behavior: Behavior normal.     BP 138/82 (BP Location: Right Arm)   Pulse 62   Ht 5\' 3"  (1.6 m)   Wt 135 lb 3.2 oz (61.3 kg)   SpO2 97%   BMI 23.95 kg/m  Wt Readings from Last 3 Encounters:  04/09/24 135 lb 3.2 oz (61.3 kg)  01/09/24 139 lb 9.6 oz (63.3 kg)  12/06/23 135 lb 0.6 oz (61.3 kg)    Lab Results  Component Value Date   TSH 2.460 01/09/2024   Lab Results  Component Value Date   WBC 3.7 01/09/2024   HGB 11.4 01/09/2024   HCT 33.9 (L) 01/09/2024   MCV 82 01/09/2024   PLT 102 (L) 01/09/2024   Lab Results  Component Value Date   NA 139 01/09/2024   K 4.9 01/09/2024    CO2 23 01/09/2024   GLUCOSE 152 (H) 01/09/2024   BUN 19 01/09/2024   CREATININE 1.09 (H) 01/09/2024   BILITOT 0.6 01/09/2024   ALKPHOS 76 01/09/2024   AST 29 01/09/2024   ALT 11 01/09/2024   PROT 7.5 01/09/2024   ALBUMIN  3.7 (  L) 01/09/2024   CALCIUM  9.1 01/09/2024   ANIONGAP 7 11/29/2021   EGFR 55 (L) 01/09/2024   GFR 48.42 (L) 04/25/2022   Lab Results  Component Value Date   CHOL 175 01/09/2024   Lab Results  Component Value Date   HDL 45 01/09/2024   Lab Results  Component Value Date   LDLCALC 110 (H) 01/09/2024   Lab Results  Component Value Date   TRIG 113 01/09/2024   Lab Results  Component Value Date   CHOLHDL 3.9 01/09/2024   Lab Results  Component Value Date   HGBA1C 6.9 (H) 01/09/2024      Assessment & Plan:   Problem List Items Addressed This Visit       Cardiovascular and Mediastinum   Essential hypertension   BP Readings from Last 1 Encounters:  04/09/24 138/82   Uncontrolled with Coreg  25 mg BID and Lasix  40 mg QD On Lasix  40 mg daily for cirrhosis related ascites, but she has stopped spironolactone  due to myalgias Added Amlodipine  5 mg QD Counseled for compliance with the medications Advised DASH diet and moderate exercise/walking, at least 150 mins/week      Relevant Medications   amLODipine  (NORVASC ) 5 MG tablet     Digestive   Cirrhosis of liver with ascites (HCC)   Had paracentesis (05/24) - 2.4 liters of clear fluid removed Has recurrence of ascites, discussed about portacaval shunt versus repeated paracentesis with GI  Last EGD showed portal hypertensive gastropathy and esophageal varices On B-blocker for ppx On Lasix  40 mg QD, did not tolerate spironolactone       Relevant Orders   CBC with Differential/Platelet   CMP14+EGFR     Endocrine   Type 2 diabetes mellitus with other specified complication (HCC)   Lab Results  Component Value Date   HGBA1C 6.9 (H) 01/09/2024   Associated with HTN and HLD On Glipizide  10 mg  BID and Tresiba 10 U QD (instead of prescribed BID) - check HbA1c Well-controlled, but has been uncontrolled in the past due to inconsistent diet Did not tolerate Metformin  and Januvia  in the past Considering her history of liver cirrhosis and chronic nausea, would avoid GLP-1 agonist therapy Strictly advised to check blood glucose regularly and contact if blood glucose more than 300 or less than 70  She would benefit from CGM, but she declines it Gvoke hypopen  prescribed for hypoglycemia Advised to follow diabetic diet On ACEi and statin F/u CMP and HbA1c Diabetic eye exam: Advised to follow up with Ophthalmology for diabetic eye exam      Relevant Orders   CMP14+EGFR   Hemoglobin A1c     Other   Major depressive disorder, recurrent episode, moderate (HCC) - Primary   Flowsheet Row Office Visit from 04/09/2024 in [facility]  PHQ-9 Total Score 0      Overall better controlled now Continue Seroquel  50 mg at bedtime DC Prozac  as she had excessive sweating - concern for serotonin syndrome Her major concern is that her husband remains busy with his cars and does not spend time with her.  No current concern of physical or verbal abuse.  Had lengthy discussion to find alternative activities, she denies BH therapy as she wants her husband to get therapy        Meds ordered this encounter  Medications   amLODipine  (NORVASC ) 5 MG tablet    Sig: Take 1 tablet (5 mg total) by mouth daily.    Dispense:  30 tablet  Refill:  3    Follow-up: Return in about 3 months (around 07/09/2024) for HTN, DM and MDD.    Meldon Sport, MD

## 2024-04-11 LAB — HEMOGLOBIN A1C
Est. average glucose Bld gHb Est-mCnc: 298 mg/dL
Hgb A1c MFr Bld: 12 % — ABNORMAL HIGH (ref 4.8–5.6)

## 2024-04-11 LAB — CBC WITH DIFFERENTIAL/PLATELET
Basophils Absolute: 0 10*3/uL (ref 0.0–0.2)
Basos: 1 %
EOS (ABSOLUTE): 0.1 10*3/uL (ref 0.0–0.4)
Eos: 5 %
Hematocrit: 33.6 % — ABNORMAL LOW (ref 34.0–46.6)
Hemoglobin: 10.5 g/dL — ABNORMAL LOW (ref 11.1–15.9)
Immature Grans (Abs): 0 10*3/uL (ref 0.0–0.1)
Immature Granulocytes: 0 %
Lymphocytes Absolute: 0.5 10*3/uL — ABNORMAL LOW (ref 0.7–3.1)
Lymphs: 18 %
MCH: 25.7 pg — ABNORMAL LOW (ref 26.6–33.0)
MCHC: 31.3 g/dL — ABNORMAL LOW (ref 31.5–35.7)
MCV: 82 fL (ref 79–97)
Monocytes Absolute: 0.3 10*3/uL (ref 0.1–0.9)
Monocytes: 10 %
Neutrophils Absolute: 1.9 10*3/uL (ref 1.4–7.0)
Neutrophils: 66 %
Platelets: 72 10*3/uL — CL (ref 150–450)
RBC: 4.08 x10E6/uL (ref 3.77–5.28)
RDW: 14.5 % (ref 11.7–15.4)
WBC: 2.8 10*3/uL — ABNORMAL LOW (ref 3.4–10.8)

## 2024-04-11 LAB — CMP14+EGFR
ALT: 10 IU/L (ref 0–32)
AST: 20 IU/L (ref 0–40)
Albumin: 3.9 g/dL (ref 3.9–4.9)
Alkaline Phosphatase: 75 IU/L (ref 44–121)
BUN/Creatinine Ratio: 21 (ref 12–28)
BUN: 32 mg/dL — ABNORMAL HIGH (ref 8–27)
Bilirubin Total: 0.6 mg/dL (ref 0.0–1.2)
CO2: 24 mmol/L (ref 20–29)
Calcium: 9.6 mg/dL (ref 8.7–10.3)
Chloride: 94 mmol/L — ABNORMAL LOW (ref 96–106)
Creatinine, Ser: 1.53 mg/dL — ABNORMAL HIGH (ref 0.57–1.00)
Globulin, Total: 4.3 g/dL (ref 1.5–4.5)
Glucose: 473 mg/dL — ABNORMAL HIGH (ref 70–99)
Potassium: 5.3 mmol/L — ABNORMAL HIGH (ref 3.5–5.2)
Sodium: 131 mmol/L — ABNORMAL LOW (ref 134–144)
Total Protein: 8.2 g/dL (ref 6.0–8.5)
eGFR: 36 mL/min/{1.73_m2} — ABNORMAL LOW (ref >59)

## 2024-04-15 ENCOUNTER — Other Ambulatory Visit: Payer: Self-pay | Admitting: Internal Medicine

## 2024-04-15 DIAGNOSIS — F331 Major depressive disorder, recurrent, moderate: Secondary | ICD-10-CM

## 2024-04-23 LAB — CMP14+EGFR
ALT: 7 IU/L (ref 0–32)
AST: 23 IU/L (ref 0–40)
Albumin: 3.9 g/dL (ref 3.9–4.9)
Alkaline Phosphatase: 67 IU/L (ref 44–121)
BUN/Creatinine Ratio: 25 (ref 12–28)
BUN: 35 mg/dL — ABNORMAL HIGH (ref 8–27)
Bilirubin Total: 0.5 mg/dL (ref 0.0–1.2)
CO2: 25 mmol/L (ref 20–29)
Calcium: 9.3 mg/dL (ref 8.7–10.3)
Chloride: 98 mmol/L (ref 96–106)
Creatinine, Ser: 1.4 mg/dL — ABNORMAL HIGH (ref 0.57–1.00)
Globulin, Total: 4 g/dL (ref 1.5–4.5)
Glucose: 217 mg/dL — ABNORMAL HIGH (ref 70–99)
Potassium: 4.9 mmol/L (ref 3.5–5.2)
Sodium: 136 mmol/L (ref 134–144)
Total Protein: 7.9 g/dL (ref 6.0–8.5)
eGFR: 40 mL/min/{1.73_m2} — ABNORMAL LOW (ref >59)

## 2024-04-23 LAB — LIPID PANEL
Chol/HDL Ratio: 4.3 ratio (ref 0.0–4.4)
Cholesterol, Total: 152 mg/dL (ref 100–199)
HDL: 35 mg/dL — ABNORMAL LOW (ref >39)
LDL Chol Calc (NIH): 90 mg/dL (ref 0–99)
Triglycerides: 152 mg/dL — ABNORMAL HIGH (ref 0–149)
VLDL Cholesterol Cal: 27 mg/dL (ref 5–40)

## 2024-04-23 LAB — TSH: TSH: 2.76 u[IU]/mL (ref 0.450–4.500)

## 2024-04-23 LAB — CBC
Hematocrit: 33.3 % — ABNORMAL LOW (ref 34.0–46.6)
Hemoglobin: 10.7 g/dL — ABNORMAL LOW (ref 11.1–15.9)
MCH: 25.7 pg — ABNORMAL LOW (ref 26.6–33.0)
MCHC: 32.1 g/dL (ref 31.5–35.7)
MCV: 80 fL (ref 79–97)
Platelets: 79 10*3/uL — CL (ref 150–450)
RBC: 4.16 x10E6/uL (ref 3.77–5.28)
RDW: 14.6 % (ref 11.7–15.4)
WBC: 3.1 10*3/uL — ABNORMAL LOW (ref 3.4–10.8)

## 2024-04-23 LAB — HEMOGLOBIN A1C
Est. average glucose Bld gHb Est-mCnc: 289 mg/dL
Hgb A1c MFr Bld: 11.7 % — ABNORMAL HIGH (ref 4.8–5.6)

## 2024-04-27 ENCOUNTER — Encounter: Payer: Self-pay | Admitting: Internal Medicine

## 2024-04-27 ENCOUNTER — Ambulatory Visit (INDEPENDENT_AMBULATORY_CARE_PROVIDER_SITE_OTHER): Admitting: Internal Medicine

## 2024-04-27 VITALS — BP 158/70 | HR 60 | Ht 63.0 in | Wt 134.0 lb

## 2024-04-27 DIAGNOSIS — I10 Essential (primary) hypertension: Secondary | ICD-10-CM

## 2024-04-27 DIAGNOSIS — D696 Thrombocytopenia, unspecified: Secondary | ICD-10-CM

## 2024-04-27 DIAGNOSIS — E782 Mixed hyperlipidemia: Secondary | ICD-10-CM | POA: Diagnosis not present

## 2024-04-27 DIAGNOSIS — Z794 Long term (current) use of insulin: Secondary | ICD-10-CM

## 2024-04-27 DIAGNOSIS — E1169 Type 2 diabetes mellitus with other specified complication: Secondary | ICD-10-CM | POA: Diagnosis not present

## 2024-04-27 MED ORDER — FREESTYLE LIBRE 3 READER DEVI: 0 refills | Status: DC

## 2024-04-27 MED ORDER — FREESTYLE LIBRE 3 PLUS SENSOR MISC: 1 refills | Status: DC

## 2024-04-27 NOTE — Progress Notes (Signed)
 Established Patient Office Visit  Subjective:  Patient ID: [name] [name], female    DOB: December 18, 1950  Age: 73 y.o. MRN: [id number]  CC:  Chief Complaint  Patient presents with   Care Management    Two week follow up     HPI [name] [name] is a 73 y.o. female with past medical history of NASH related liver cirrhosis, portal hypertensive gastropathy, esophageal varices, HTN, AS, DM, HLD, MGUS, iron deficiency anemia, depression and anxiety who presents for annual physical.  DM: Her HbA1c has worsened to 11.7 from 6.9 in 01/25. She is taking glipizide  10 mg BID and she has started taking Toujeo  10 units BID since the blood tests. She admits that she still needs to improve her diet.  She did did not tolerate metformin  and Januvia  in the past.   Her blood glucose levels have improved now, but has not been checking regularly.  She denies any episode of hypoglycemia since decreasing dose of Tresiba to 10 units.  Denies any polyuria or polyphagia currently.  HTN: Her BP is elevated today. She has been placed on Amlodipine  5 mg once daily, but she has not started it yet. She also takes Lasix  40 mg QD and Coreg  25 mg BID considering her history of liver cirrhosis and ascites, but does not take spironolactone  due to myalgias.  She denies any headache, dizziness, chest pain, dyspnea or palpitations.  NASH related liver cirrhosis: She has recurrent ascites, had paracentesis on 05/19 -about 2.4 L fluid was removed.  Of note, she has not been taking spironolactone  and takes only 40 mg of Lasix  currently.  MDD: She has stopped taking Prozac , her excessive sweating is improved now. She was placed on Seroquel  50 mg qHS for resistant depression and insomnia.  She still complains of apathy, lack of interest in routine activities and lack of appetite, but is overall better than prior.  She still complains of anhedonia and agitation, but denies crying spells.  She also has spells of anxiety due to arguments  with her husband. She denies SI or HI currently.  She has tried Zoloft , Lexapro , Effexor  and Rexulti  before.     Past Medical History:  Diagnosis Date   Allergy    Anemia, unspecified    Anxiety    Arthritis    Chronic diarrhea    Cirrhosis (HCC)    Depression    Depression    Elevated cholesterol    Enterocolitis due to Clostridium difficile, not specified as recurrent    Gastro-esophageal reflux disease with esophagitis    Helicobacter pylori gastritis 07/10/2017   Dx EGD -  1) Omeprazole  20 mg 2 times a day x 14 d 2) Pepto Bismol 2 tabs (262 mg each) 4 times a day x 14 d 3) Metronidazole  250 mg 4 times a day x 14 d 4) doxycycline  100 mg 2 times a day x 14 d  After 14 d stop omeprazole  also  In 4 weeks after treatment completed do H. Pylori stool antigen - dx H. Pylori gastritis    Hx of adenomatous colonic polyps 10/05/2015   Hyperlipemia 02/21/2009   NUC STRESS TEST-NORMAL- EF 74%   Hypertension    Iron deficiency anemia    Irritable bowel syndrome    Localized edema    Major depressive disorder, single episode, unspecified    MVP (mitral valve prolapse)    Rectocele    Thrombocytopenia (HCC)    Type 2 diabetes mellitus with hyperglycemia (HCC)  Past Surgical History:  Procedure Laterality Date   ABDOMINAL HYSTERECTOMY     BILATERAL SALPINGOOPHORECTOMY     carpel tunnel Bilateral 12/17/2009   COLONOSCOPY     ESOPHAGOGASTRODUODENOSCOPY     IR PARACENTESIS  05/03/2022   IR RADIOLOGIST EVAL & MGMT  11/12/2022   TUBAL LIGATION      Family History  Problem Relation Age of Onset   Anxiety disorder Mother    ADD / ADHD Brother    Alcohol abuse Brother        x 3   Drug abuse Brother    Anxiety disorder Maternal Aunt    Anxiety disorder Maternal Grandmother    Bipolar disorder Other    Emphysema Father    Diabetes Sister    Hyperlipidemia Sister    Cirrhosis Sister    Hyperlipidemia Sister    Diabetes Sister    Diabetes Maternal Uncle    Dementia Neg Hx     OCD Neg Hx    Paranoid behavior Neg Hx    Physical abuse Neg Hx    Schizophrenia Neg Hx    Seizures Neg Hx    Sexual abuse Neg Hx    Colon cancer Neg Hx    Colon polyps Neg Hx    Pancreatic cancer Neg Hx    Esophageal cancer Neg Hx    Stomach cancer Neg Hx    Kidney disease Neg Hx    Liver disease Neg Hx    Rectal cancer Neg Hx     Social History   Socioeconomic History   Marital status: Married    Spouse name: Mont Antis   Number of children: 2   Years of education: Not on file   Highest education level: Not on file  Occupational History   Not on file  Tobacco Use   Smoking status: Former    Current packs/day: 0.00    Types: Cigarettes    Start date: 09/04/1967    Quit date: 09/03/1997    Years since quitting: 26.6    Passive exposure: Past   Smokeless tobacco: Former    Types: Snuff    Quit date: 05/2022   Tobacco comments:    call when ready to quit    06/14/2022 patient stated she quit 5 or so weeks ago  Vaping Use   Vaping status: Never Used  Substance and Sexual Activity   Alcohol use: No    Alcohol/week: 0.0 standard drinks of alcohol   Drug use: No   Sexual activity: Not on file  Other Topics Concern   Not on file  Social History Narrative   Married 2 sons 1 lives in [place]  1 lives in [place]   She is a housewife   No alcohol tobacco or drug use at this time   Former smoker quit in 1998   Social Drivers of Health   Financial Resource Strain: Low Risk  (11/20/2023)   Overall Financial Resource Strain (CARDIA)    Difficulty of Paying Living Expenses: Not hard at all  Food Insecurity: No Food Insecurity (11/20/2023)   Hunger Vital Sign    Worried About Running Out of Food in the Last Year: Never true    Ran Out of Food in the Last Year: Never true  Transportation Needs: No Transportation Needs (11/20/2023)   PRAPARE - Administrator, Civil Service (Medical): No    Lack of Transportation (Non-Medical): No  Physical Activity: Inactive  (11/20/2023)   Exercise Vital Sign  Days of Exercise per Week: 0 days    Minutes of Exercise per Session: 0 min  Stress: No Stress Concern Present (11/20/2023)   [employer] of Occupational Health - Occupational Stress Questionnaire    Feeling of Stress : Not at all  Social Connections: Socially Integrated (11/20/2023)   Social Connection and Isolation Panel [NHANES]    Frequency of Communication with Friends and Family: More than three times a week    Frequency of Social Gatherings with Friends and Family: More than three times a week    Attends Religious Services: More than 4 times per year    Active Member of [employer] or Organizations: Yes    Attends Engineer, structural: More than 4 times per year    Marital Status: Married  Catering manager Violence: Not At Risk (11/20/2023)   Humiliation, Afraid, Rape, and Kick questionnaire    Fear of Current or Ex-Partner: No    Emotionally Abused: No    Physically Abused: No    Sexually Abused: No    Outpatient Medications Prior to Visit  Medication Sig Dispense Refill   amLODipine  (NORVASC ) 5 MG tablet Take 1 tablet (5 mg total) by mouth daily. 30 tablet 3   baclofen  (LIORESAL ) 10 MG tablet TAKE 1 TABLET BY MOUTH TWICE A DAY AS NEEDED FOR MUSCLE SPASMS 180 tablet 1   BD PEN NEEDLE NANO 2ND GEN 32G X 4 MM MISC USE AS DIRECTED 4 TIMES A DAY 100 each 5   BIOTIN PO Take 500 mg by mouth daily.     Blood Glucose Monitoring Suppl DEVI 1 each by Does not apply route in the morning, at noon, and at bedtime. May substitute to any manufacturer covered by patient's insurance. 1 each 0   carvedilol  (COREG ) 25 MG tablet TAKE 1 TABLET BY MOUTH TWICE A DAY 180 tablet 3   ferrous sulfate 325 (65 FE) MG EC tablet Take 325 mg by mouth 2 (two) times daily. One daily     furosemide  (LASIX ) 40 MG tablet Take 1 tablet (40 mg total) by mouth daily. 90 tablet 1   glipiZIDE  (GLUCOTROL ) 10 MG tablet TAKE 1 TABLET(10MG  TOTAL) BY MOUTH DAILY BEFORE BREAKFAST  90 tablet 1   Glucagon  (GVOKE HYPOPEN  2-PACK) 1 MG/0.2ML SOAJ Inject 1 mg into the skin as needed (For blood glucose < 53). 0.4 mL 5   Glucose Blood (BLOOD GLUCOSE TEST STRIPS) STRP 1 each by In Vitro route in the morning, at noon, and at bedtime. May substitute to any manufacturer covered by patient's insurance. 100 strip 3   insulin  glargine, 2 Unit Dial, (TOUJEO  MAX SOLOSTAR) 300 UNIT/ML Solostar Pen INJECT 20 UNITS INTO THE SKIN EVERY EVENING. 3 mL 0   ondansetron  (ZOFRAN ) 4 MG tablet Take 1 tablet (4 mg total) by mouth every 8 (eight) hours as needed for nausea or vomiting. 20 tablet 0   QUEtiapine  (SEROQUEL ) 50 MG tablet TAKE 1 TABLET BY MOUTH TWICE A DAY 180 tablet 1   rosuvastatin  (CRESTOR ) 5 MG tablet TAKE 1 TABLET (5 MG TOTAL) BY MOUTH DAILY. TAKES ONE ON MONDAY, WED., AND FRIDAY 45 tablet 1   spironolactone  (ALDACTONE ) 25 MG tablet Take 1 tablet (25 mg total) by mouth daily. 90 tablet 3   triamcinolone  cream (KENALOG ) 0.1 % APPLY TO AFFECTED AREA TWICE A DAY 30 g 0   No facility-administered medications prior to visit.    Allergies  Allergen Reactions   Codeine Other (See Comments)    Took it  years ago and doesn't remember the reaction   Spironolactone  Other (See Comments)    Reports intolerance , states unable to take the medication    ROS Review of Systems  Constitutional:  Positive for fatigue. Negative for chills and fever.  HENT:  Negative for congestion, sinus pressure, sinus pain and sore throat.   Eyes:  Negative for pain and discharge.  Respiratory:  Negative for cough and shortness of breath.   Cardiovascular:  Negative for chest pain and palpitations.  Gastrointestinal:  Negative for diarrhea, nausea and vomiting.  Endocrine: Negative for polydipsia and polyuria.  Genitourinary:  Negative for dysuria and hematuria.  Musculoskeletal:  Positive for arthralgias. Negative for neck pain and neck stiffness.  Skin:  Negative for rash.  Neurological:  Negative for  dizziness and weakness.  Psychiatric/Behavioral:  Positive for dysphoric mood and sleep disturbance. Negative for agitation and behavioral problems. The patient is nervous/anxious.       Objective:    Physical Exam Vitals reviewed.  Constitutional:      General: She is not in acute distress.    Appearance: She is not diaphoretic.  HENT:     Head: Normocephalic and atraumatic.     Nose: Nose normal.     Mouth/Throat:     Mouth: Mucous membranes are moist.  Eyes:     General: No scleral icterus.    Extraocular Movements: Extraocular movements intact.  Cardiovascular:     Rate and Rhythm: Normal rate and regular rhythm.     Pulses: Normal pulses.     Heart sounds: Murmur (Systolic over left upper sternal border) heard.  Pulmonary:     Breath sounds: Normal breath sounds. No wheezing or rales.  Abdominal:     General: There is no distension.     Palpations: Abdomen is soft.  Musculoskeletal:     Right shoulder: No swelling. Decreased range of motion.     Cervical back: Neck supple. No tenderness.     Right lower leg: No edema.     Left lower leg: No edema.  Skin:    General: Skin is warm.     Findings: No rash.  Neurological:     General: No focal deficit present.     Mental Status: She is alert and oriented to person, place, and time.  Psychiatric:        Mood and Affect: Mood normal.        Behavior: Behavior normal.     BP (!) 158/70 (BP Location: Left Arm)   Pulse 60   Ht 5\' 3"  (1.6 m)   Wt 134 lb (60.8 kg)   SpO2 98%   BMI 23.74 kg/m  Wt Readings from Last 3 Encounters:  04/27/24 134 lb (60.8 kg)  04/09/24 135 lb 3.2 oz (61.3 kg)  01/09/24 139 lb 9.6 oz (63.3 kg)    Lab Results  Component Value Date   TSH 2.760 04/22/2024   Lab Results  Component Value Date   WBC 3.1 (L) 04/22/2024   HGB 10.7 (L) 04/22/2024   HCT 33.3 (L) 04/22/2024   MCV 80 04/22/2024   PLT 79 (LL) 04/22/2024   Lab Results  Component Value Date   NA 136 04/22/2024   K 4.9  04/22/2024   CO2 25 04/22/2024   GLUCOSE 217 (H) 04/22/2024   BUN 35 (H) 04/22/2024   CREATININE 1.40 (H) 04/22/2024   BILITOT 0.5 04/22/2024   ALKPHOS 67 04/22/2024   AST 23 04/22/2024   ALT 7 04/22/2024  PROT 7.9 04/22/2024   ALBUMIN  3.9 04/22/2024   CALCIUM  9.3 04/22/2024   ANIONGAP 7 11/29/2021   EGFR 40 (L) 04/22/2024   GFR 48.42 (L) 04/25/2022   Lab Results  Component Value Date   CHOL 152 04/22/2024   Lab Results  Component Value Date   HDL 35 (L) 04/22/2024   Lab Results  Component Value Date   LDLCALC 90 04/22/2024   Lab Results  Component Value Date   TRIG 152 (H) 04/22/2024   Lab Results  Component Value Date   CHOLHDL 4.3 04/22/2024   Lab Results  Component Value Date   HGBA1C 11.7 (H) 04/22/2024      Assessment & Plan:   Problem List Items Addressed This Visit       Cardiovascular and Mediastinum   Essential hypertension - Primary   BP Readings from Last 1 Encounters:  04/27/24 (!) 158/70   Uncontrolled with Coreg  25 mg BID and Lasix  40 mg QD On Lasix  40 mg daily for cirrhosis related ascites, but she has stopped spironolactone  due to myalgias Added Amlodipine  5 mg QD - needs to start taking it Counseled for compliance with the medications Advised DASH diet and moderate exercise/walking, at least 150 mins/week        Endocrine   Type 2 diabetes mellitus with other specified complication (HCC)   Lab Results  Component Value Date   HGBA1C 11.7 (H) 04/22/2024   Associated with HTN and HLD On Glipizide  10 mg BID and Tresiba 10 U BID Uncontrolled due to inconsistent diet Did not tolerate Metformin  and Januvia  in the past Considering her history of liver cirrhosis and chronic nausea, would avoid GLP-1 agonist therapy Strictly advised to check blood glucose regularly and contact if blood glucose more than 300 or less than 70  She would benefit from CGM - agrees for it now, sent freestyle libre 3 plus Gvoke hypopen  prescribed for  hypoglycemia Advised to follow diabetic diet On ACEi and statin F/u CMP and HbA1c Diabetic eye exam: Advised to follow up with Ophthalmology for diabetic eye exam      Relevant Medications   Continuous Glucose Receiver (FREESTYLE LIBRE 3 READER) DEVI   Continuous Glucose Sensor (FREESTYLE LIBRE 3 PLUS SENSOR) MISC     Hematopoietic and Hemostatic   THROMBOCYTOPENIA   Has seen heme-onc Likely due to underlying NASH and/or nutritional deficiencies Continue iron supplement for anemia Has easy bruising Check CBC        Other   Mixed hyperlipidemia   On Crestor  5 mg QD QOD Checked lipid profile        Meds ordered this encounter  Medications   Continuous Glucose Receiver (FREESTYLE LIBRE 3 READER) DEVI    Sig: Use it to check blood glucose as instructed.    Dispense:  1 each    Refill:  0   Continuous Glucose Sensor (FREESTYLE LIBRE 3 PLUS SENSOR) MISC    Sig: Change sensor every 15 days.    Dispense:  6 each    Refill:  1    Follow-up: Return if symptoms worsen or fail to improve.    Meldon Sport, MD

## 2024-04-27 NOTE — Assessment & Plan Note (Signed)
Has seen heme-onc Likely due to underlying NASH and/or nutritional deficiencies Continue iron supplement for anemia Has easy bruising Check CBC

## 2024-04-27 NOTE — Assessment & Plan Note (Addendum)
 BP Readings from Last 1 Encounters:  04/27/24 (!) 158/70   Uncontrolled with Coreg  25 mg BID and Lasix  40 mg QD On Lasix  40 mg daily for cirrhosis related ascites, but she has stopped spironolactone  due to myalgias Added Amlodipine  5 mg QD - needs to start taking it Counseled for compliance with the medications Advised DASH diet and moderate exercise/walking, at least 150 mins/week

## 2024-04-27 NOTE — Assessment & Plan Note (Addendum)
 Lab Results  Component Value Date   HGBA1C 11.7 (H) 04/22/2024   Associated with HTN and HLD On Glipizide  10 mg BID and Tresiba 10 U BID Uncontrolled due to inconsistent diet Did not tolerate Metformin  and Januvia  in the past Considering her history of liver cirrhosis and chronic nausea, would avoid GLP-1 agonist therapy Strictly advised to check blood glucose regularly and contact if blood glucose more than 300 or less than 70  She would benefit from CGM - agrees for it now, sent freestyle libre 3 plus Gvoke hypopen  prescribed for hypoglycemia Advised to follow diabetic diet On ACEi and statin F/u CMP and HbA1c Diabetic eye exam: Advised to follow up with Ophthalmology for diabetic eye exam

## 2024-04-27 NOTE — Patient Instructions (Addendum)
 Please start taking Amlodipine  in addition to other medications.  Please continue to take medications as prescribed.  Please continue to follow low carb diet and ambulate as tolerated.  Please get fasting blood tests done before the next visit.

## 2024-04-27 NOTE — Assessment & Plan Note (Addendum)
 On Crestor  5 mg QD QOD Checked lipid profile

## 2024-05-16 ENCOUNTER — Other Ambulatory Visit: Payer: Self-pay | Admitting: Internal Medicine

## 2024-05-16 ENCOUNTER — Other Ambulatory Visit: Payer: Self-pay | Admitting: Cardiovascular Disease

## 2024-05-16 DIAGNOSIS — E782 Mixed hyperlipidemia: Secondary | ICD-10-CM

## 2024-05-26 ENCOUNTER — Telehealth: Payer: Self-pay | Admitting: Internal Medicine

## 2024-05-26 NOTE — Telephone Encounter (Signed)
 Called patient, needs to schedule diabetic eye exam. Will call back to schedule.

## 2024-05-28 ENCOUNTER — Encounter (INDEPENDENT_AMBULATORY_CARE_PROVIDER_SITE_OTHER): Payer: Self-pay | Admitting: Gastroenterology

## 2024-05-28 ENCOUNTER — Ambulatory Visit (INDEPENDENT_AMBULATORY_CARE_PROVIDER_SITE_OTHER): Payer: Medicare HMO | Admitting: Gastroenterology

## 2024-05-28 VITALS — BP 128/55 | HR 63 | Temp 97.8°F | Ht 63.0 in | Wt 136.0 lb

## 2024-05-28 DIAGNOSIS — D509 Iron deficiency anemia, unspecified: Secondary | ICD-10-CM

## 2024-05-28 DIAGNOSIS — R188 Other ascites: Secondary | ICD-10-CM

## 2024-05-28 DIAGNOSIS — K7460 Unspecified cirrhosis of liver: Secondary | ICD-10-CM | POA: Diagnosis not present

## 2024-05-28 DIAGNOSIS — K7581 Nonalcoholic steatohepatitis (NASH): Secondary | ICD-10-CM

## 2024-05-28 DIAGNOSIS — Z860100 Personal history of colon polyps, unspecified: Secondary | ICD-10-CM

## 2024-05-28 NOTE — Patient Instructions (Signed)
-  we will update labs and US  in regards to your liver  -you are due for colonoscopy, I will discuss repeat upper endoscopy with Dr. Sammi Crick as well, someone will call to schedule procedures in the next few days  -Continue carvedilol  25mg  twice daily -Continue lasix  40mg  daily - Reduce salt intake to <2 g per day - Can take Tylenol  max of 2 g per day (650 mg q8h) for pain - Avoid NSAIDs for pain - Avoid eating raw oysters/shellfish - Ensure every night before going to sleep  Follow up 6 months  It was a pleasure to see you today. I want to create trusting relationships with patients and provide genuine, compassionate, and quality care. I truly value your feedback! please be on the lookout for a survey regarding your visit with me today. I appreciate your input about our visit and your time in completing this!    Adriane Guglielmo L. Key Cen, MSN, APRN, AGNP-C Adult-Gerontology Nurse Practitioner [facility]

## 2024-05-28 NOTE — Progress Notes (Addendum)
 Referring Provider: Meldon Sport, MD Primary Care Physician:  Meldon Sport, MD Primary GI Physician: Dr. Sammi Crick   Chief Complaint  Patient presents with   Follow-up    Patient here today for a follow up on cirrhosis. Patient denies any current gi issues. She does mentions she has an unusual feeling in her chest. No pain, or discomfort, or burning sensation.    HPI:   [name] [name] is a 73 y.o. female with past medical history of NASH cirrhosis, MGUS, H pylori gastritis treatd in 2018, IDA, adenomatous colon polyps, moderate aortic stenosis, HTN.    Patient presenting today for:  Follow up of cirrhosis with grade II EVs IDA   Last seen December 2024, at that time more swelling in abdomen. Taking lasix  but not spironolactone  as this makes her feel bad. Reported some itching at times. MELD 3.0 in may 2024 was 8  Recommended to check CBC, CMP, INR, AFP, RUQ US , continue daily iron, continue coreg  25mg  BID, continue lasix  40mg  daily  RUQ US  was not completed Labs done in January 2025 with MELD 3.0 12 AFP in January 2.8   Most recent labs with on 5/7: creat 1.4, BUN 35, AST 23, ALT 7, t bili 0.5, albumin  3.9 sodium 135, potassium 4.9, plt count 79, hgb 10.7, WBC 3.1   Present: States she has had a sensation in her chest this week. Denies pain, pressure, sob, coughing. States she cannot explain it. No association with eating. No radiation of pain. She denies nausea or vomiting. She took an allergy pill as she thought it may be related to allergies, noted some improvement with this.  Appetite is not great, weight is around mid 130s which is baseline for her. Previously on prozac  which she stopped, think this may have been contributing some to her higher appetite previously.   Denies swelling to her abdomen, some mild swelling to her feet. Denies jaundice. Some itching from dry skin. No episodes of confusion, no disorientation. Having a BM daily, stools are darker due to iron  pill. No BRBPR.   States she missed last US  of the liver due to being sick.    Cirrhosis related questions: Episodes of confusion/disorientation: no Taking diuretics? Lasix  40mg  daily  Beta blockers? Carvedilol  25mg  BID Prior history of variceal banding? No  Prior episodes of SBP? No  Last liver imaging:May 2024, Cirrhotic morphology. No focal lesion identified. Cholelithiasis. Perihepatic ascites. Alcohol use: No   TIPS evaluation with Dr. Jinx Mourning 11/12/22-not felt a candidate due to low amount of ascites  Last Colonoscopy:03/04/19- One diminutive polyp in the transverse colon, diminutive SSP - The examination was otherwise normal on direct and retroflexion views. - Personal history of colonic polyps. 2 adenomas max 15 mm 2016 Last Endoscopy: April 2022 with grade II varices, portal hypertensive gastropathy   Recommendations:  TCS due March 2025  Past Medical History:  Diagnosis Date   Allergy    Anemia, unspecified    Anxiety    Arthritis    Chronic diarrhea    Cirrhosis (HCC)    Depression    Depression    Elevated cholesterol    Enterocolitis due to Clostridium difficile, not specified as recurrent    Gastro-esophageal reflux disease with esophagitis    Helicobacter pylori gastritis 07/10/2017   Dx EGD -  1) Omeprazole  20 mg 2 times a day x 14 d 2) Pepto Bismol 2 tabs (262 mg each) 4 times a day x 14 d 3) Metronidazole  250  mg 4 times a day x 14 d 4) doxycycline  100 mg 2 times a day x 14 d  After 14 d stop omeprazole  also  In 4 weeks after treatment completed do H. Pylori stool antigen - dx H. Pylori gastritis    Hx of adenomatous colonic polyps 10/05/2015   Hyperlipemia 02/21/2009   NUC STRESS TEST-NORMAL- EF 74%   Hypertension    Iron deficiency anemia    Irritable bowel syndrome    Localized edema    Major depressive disorder, single episode, unspecified    MVP (mitral valve prolapse)    Rectocele    Thrombocytopenia (HCC)    Type 2 diabetes mellitus with  hyperglycemia (HCC)     Past Surgical History:  Procedure Laterality Date   ABDOMINAL HYSTERECTOMY     BILATERAL SALPINGOOPHORECTOMY     carpel tunnel Bilateral 12/17/2009   COLONOSCOPY     ESOPHAGOGASTRODUODENOSCOPY     IR PARACENTESIS  05/03/2022   IR RADIOLOGIST EVAL & MGMT  11/12/2022   TUBAL LIGATION      Current Outpatient Medications  Medication Sig Dispense Refill   amLODipine  (NORVASC ) 5 MG tablet Take 1 tablet (5 mg total) by mouth daily. 30 tablet 3   baclofen  (LIORESAL ) 10 MG tablet TAKE 1 TABLET BY MOUTH TWICE A DAY AS NEEDED FOR MUSCLE SPASMS 180 tablet 1   BD PEN NEEDLE NANO 2ND GEN 32G X 4 MM MISC USE AS DIRECTED 4 TIMES A DAY 100 each 5   BIOTIN PO Take 500 mg by mouth daily.     Blood Glucose Monitoring Suppl DEVI 1 each by Does not apply route in the morning, at noon, and at bedtime. May substitute to any manufacturer covered by patient's insurance. 1 each 0   carvedilol  (COREG ) 25 MG tablet TAKE 1 TABLET BY MOUTH TWICE A DAY 60 tablet 0   ferrous sulfate 325 (65 FE) MG EC tablet Take 325 mg by mouth 2 (two) times daily. One daily (Patient taking differently: Take 325 mg by mouth daily with breakfast. One daily)     furosemide  (LASIX ) 40 MG tablet Take 1 tablet (40 mg total) by mouth daily. 90 tablet 1   glipiZIDE  (GLUCOTROL ) 10 MG tablet TAKE 1 TABLET(10MG  TOTAL) BY MOUTH DAILY BEFORE BREAKFAST (Patient taking differently: Take 10 mg by mouth 2 (two) times daily before a meal.) 90 tablet 1   Glucagon  (GVOKE HYPOPEN  2-PACK) 1 MG/0.2ML SOAJ Inject 1 mg into the skin as needed (For blood glucose < 53). 0.4 mL 5   Glucose Blood (BLOOD GLUCOSE TEST STRIPS) STRP 1 each by In Vitro route in the morning, at noon, and at bedtime. May substitute to any manufacturer covered by patient's insurance. 100 strip 3   insulin  glargine, 2 Unit Dial, (TOUJEO  MAX SOLOSTAR) 300 UNIT/ML Solostar Pen INJECT 20 UNITS INTO THE SKIN EVERY EVENING. (Patient taking differently: Inject 10 Units into  the skin 2 (two) times daily.) 3 mL 0   ondansetron  (ZOFRAN ) 4 MG tablet Take 1 tablet (4 mg total) by mouth every 8 (eight) hours as needed for nausea or vomiting. 20 tablet 0   QUEtiapine  (SEROQUEL ) 50 MG tablet TAKE 1 TABLET BY MOUTH TWICE A DAY (Patient taking differently: Take 50 mg by mouth at bedtime.) 180 tablet 1   rosuvastatin  (CRESTOR ) 5 MG tablet TAKE 1 TABLET (5 MG TOTAL) BY MOUTH DAILY. TAKES ONE ON MONDAY, WED., AND FRIDAY 45 tablet 1   triamcinolone  cream (KENALOG ) 0.1 % APPLY TO AFFECTED AREA  TWICE A DAY 30 g 0   Continuous Glucose Receiver (FREESTYLE LIBRE 3 READER) DEVI Use it to check blood glucose as instructed. (Patient not taking: Reported on 05/28/2024) 1 each 0   Continuous Glucose Sensor (FREESTYLE LIBRE 3 PLUS SENSOR) MISC Change sensor every 15 days. (Patient not taking: Reported on 05/28/2024) 6 each 1   spironolactone  (ALDACTONE ) 25 MG tablet Take 1 tablet (25 mg total) by mouth daily. 90 tablet 3   No current facility-administered medications for this visit.    Allergies as of 05/28/2024 - Review Complete 05/28/2024  Allergen Reaction Noted   Codeine Other (See Comments)    Spironolactone  Other (See Comments) 10/09/2021    Social History   Socioeconomic History   Marital status: Married    Spouse name: Mont Antis   Number of children: 2   Years of education: Not on file   Highest education level: Not on file  Occupational History   Not on file  Tobacco Use   Smoking status: Former    Current packs/day: 0.00    Types: Cigarettes    Start date: 09/04/1967    Quit date: 09/03/1997    Years since quitting: 26.7    Passive exposure: Past   Smokeless tobacco: Former    Types: Snuff    Quit date: 05/2022   Tobacco comments:    call when ready to quit    06/14/2022 patient stated she quit 5 or so weeks ago  Vaping Use   Vaping status: Never Used  Substance and Sexual Activity   Alcohol use: No    Alcohol/week: 0.0 standard drinks of alcohol   Drug use: No    Sexual activity: Not on file  Other Topics Concern   Not on file  Social History Narrative   Married 2 sons 1 lives in [place]  1 lives in [place]   She is a housewife   No alcohol tobacco or drug use at this time   Former smoker quit in 1998   Social Drivers of Health   Financial Resource Strain: Low Risk  (11/20/2023)   Overall Financial Resource Strain (CARDIA)    Difficulty of Paying Living Expenses: Not hard at all  Food Insecurity: No Food Insecurity (11/20/2023)   Hunger Vital Sign    Worried About Running Out of Food in the Last Year: Never true    Ran Out of Food in the Last Year: Never true  Transportation Needs: No Transportation Needs (11/20/2023)   PRAPARE - Administrator, Civil Service (Medical): No    Lack of Transportation (Non-Medical): No  Physical Activity: Inactive (11/20/2023)   Exercise Vital Sign    Days of Exercise per Week: 0 days    Minutes of Exercise per Session: 0 min  Stress: No Stress Concern Present (11/20/2023)   [employer] of Occupational Health - Occupational Stress Questionnaire    Feeling of Stress : Not at all  Social Connections: Socially Integrated (11/20/2023)   Social Connection and Isolation Panel    Frequency of Communication with Friends and Family: More than three times a week    Frequency of Social Gatherings with Friends and Family: More than three times a week    Attends Religious Services: More than 4 times per year    Active Member of [employer] or Organizations: Yes    Attends Engineer, structural: More than 4 times per year    Marital Status: Married    Review of systems General: negative for malaise,  night sweats, fever, chills, weight loss Neck: Negative for lumps, goiter, pain and significant neck swelling Resp: Negative for cough, wheezing, dyspnea at rest CV: Negative for leg swelling, palpitations, orthopnea  GI: denies melena, hematochezia, nausea, vomiting, diarrhea, constipation,  dysphagia, odyonophagia, early satiety or unintentional weight loss.  MSK: Negative for joint pain or swelling, back pain, and muscle pain. Derm: Negative for itching or rash Psych: Denies depression, anxiety, memory loss, confusion. No homicidal or suicidal ideation.  Heme: Negative for prolonged bleeding, bruising easily, and swollen nodes. Endocrine: Negative for cold or heat intolerance, polyuria, polydipsia and goiter. Neuro: negative for tremor, gait imbalance, syncope and seizures. The remainder of the review of systems is noncontributory.  Physical Exam: BP (!) 128/55 (BP Location: Left Arm, Patient Position: Sitting, Cuff Size: Normal)   Pulse 63   Temp 97.8 F (36.6 C) (Temporal)   Ht 5' 3 (1.6 m)   Wt 136 lb (61.7 kg)   BMI 24.09 kg/m  General:   Alert and oriented. No distress noted. Pleasant and cooperative.  Head:  Normocephalic and atraumatic. Eyes:  Conjuctiva clear without scleral icterus. Mouth:  Oral mucosa pink and moist. Good dentition. No lesions. Heart: Normal rate and rhythm, s1 and s2 heart sounds present.  Lungs: Clear lung sounds in all lobes. Respirations equal and unlabored. Abdomen:  +BS, soft, non-tender and non-distended. No rebound or guarding. No HSM or masses noted. Derm: No palmar erythema or jaundice Msk:  Symmetrical without gross deformities. Normal posture. Extremities:  Without edema. Neurologic:  Alert and  oriented x4 Psych:  Alert and cooperative. Normal mood and affect.  Invalid input(s): 6 MONTHS   ASSESSMENT: SARAY CAPASSO is a 73 y.o. female presenting today for follow up of cirrhosis and IDA  Cirrhosis: secondary to [facility]. MELD has remained relatively low with last MELD 3.0 in January was 12. history of Grade II EVs on last EGD in 2022, on coreg  25mg  BID (HR 63 today with reported compliance, though BP 128/55) denies episodes of confusion and having regular BMs.  EGD done by [facility] in 2022 and after discussion with them,  previously it was felt she did not need repeat EGD for EV evaluation at that time due to being on a NSBB,  which is adequate.  Maintained on lasix  40mg  daily, has not tolerate spironolactone  in the past. Denies ascites, no real edema present to LEs today.  Not felt to be a TIPS candidate after evaluation by IR in 2023, due to ability to uptitrate titrate diuretics though most recently her creatnine has been elevated leaving little room for uptitration, though reassuringly she seems to be mostly euvolemic today.  Will continue with current medication doses, she is coming due for MELD labs, INR, AFP and US  for [facility] screening is overdue, Has upcoming labs in the next few weeks with PCP, and wants to have all labs done together. we will go ahead and update MELD labs, INR and AFP at that time as there has been some issues with compliance in the past.   History of colon polyps: last TCS in 2020 with HPP, recommended repeat in 5 years. She is due for this. She is agreeable to proceed with updating colonoscopy. Indications, risks and benefits of procedure discussed in detail with patient. Patient verbalized understanding and is in agreement to proceed with Colonoscopy.   [id number] for some time, atleast dating back to 2011. Has had endoscopic evaluations since onset with presence of PHG/congested/friable mucosa in the stomach, likely contributing  to her IDA. Maintained on PO iron. Last hgb 10.7 in may, last iron studies may 2024 with iron 33, TIBC 270, sat 12, ferritin 69. She is maintained on PO iron daily. Denies rectal bleeding or melena. As above, updating colonoscopy and will discuss EGD as well. Will also update iron panel along with other labs.    PLAN:  -MELD Labs, AFP, INR -will update iron studies  -schedule Colonoscopy ASA III -discuss need for repeat EGD at time of colonoscopy for EV screening given recent decline in plt count -RUQ US  for hcc screening  -Continue carvedilol  25mg  BID for now   -Continue lasix  40mg  daily - Reduce salt intake to <2 g per day - Can take Tylenol  max of 2 g per day (650 mg q8h) for pain - Avoid NSAIDs for pain - Avoid eating raw oysters/shellfish - Ensure every night before going to sleep  All questions were answered, patient verbalized understanding and is in agreement with plan as outlined above.   Follow Up: 6 months   [name] Fleece L. Adrien Alberta, MSN, APRN, AGNP-C Adult-Gerontology Nurse Practitioner [facility]   I have reviewed the note and agree with the APP's assessment as described in this progress note  Samantha Cress, MD Gastroenterology and Hepatology [facility] Gastroenterology

## 2024-05-31 ENCOUNTER — Other Ambulatory Visit: Payer: Self-pay | Admitting: Cardiovascular Disease

## 2024-05-31 ENCOUNTER — Other Ambulatory Visit: Payer: Self-pay | Admitting: Internal Medicine

## 2024-05-31 DIAGNOSIS — F331 Major depressive disorder, recurrent, moderate: Secondary | ICD-10-CM

## 2024-05-31 DIAGNOSIS — I10 Essential (primary) hypertension: Secondary | ICD-10-CM

## 2024-05-31 DIAGNOSIS — E1169 Type 2 diabetes mellitus with other specified complication: Secondary | ICD-10-CM

## 2024-06-01 LAB — HM DIABETES EYE EXAM

## 2024-06-03 ENCOUNTER — Telehealth: Payer: Self-pay

## 2024-06-03 ENCOUNTER — Other Ambulatory Visit: Payer: Self-pay | Admitting: Internal Medicine

## 2024-06-03 DIAGNOSIS — E1169 Type 2 diabetes mellitus with other specified complication: Secondary | ICD-10-CM

## 2024-06-03 MED ORDER — PEN NEEDLES 32G X 4 MM MISC: 2 refills | Status: DC

## 2024-06-03 NOTE — Telephone Encounter (Signed)
 Patient advised.

## 2024-06-03 NOTE — Telephone Encounter (Signed)
 Copied from CRM [id number]. Topic: Clinical - Medication Question >> Jun 03, 2024 10:32 AM [name] [name] wrote: Reason for CRM:  [name] [name] is not able to get her BD PEN NEEDLE NANO 2ND GEN 32G X 4 MM MISC. [facility] and [facility] are out of stock and they do not know when they will get them in. Please have a nurse to call [name] [name] at [phone] to discuss what to do. She is out of her needles. Should she change her needles since this is the 2nd time she has had an issue.    -----------------------------------------------------------------------

## 2024-06-05 ENCOUNTER — Ambulatory Visit (HOSPITAL_COMMUNITY)

## 2024-06-10 ENCOUNTER — Ambulatory Visit (INDEPENDENT_AMBULATORY_CARE_PROVIDER_SITE_OTHER): Payer: Self-pay | Admitting: Gastroenterology

## 2024-06-10 ENCOUNTER — Ambulatory Visit (HOSPITAL_COMMUNITY)
Admission: RE | Admit: 2024-06-10 | Discharge: 2024-06-10 | Disposition: A | Discharge status: Home / Self Care | Source: Ambulatory Visit | Attending: Gastroenterology | Admitting: Gastroenterology

## 2024-06-10 DIAGNOSIS — K7460 Unspecified cirrhosis of liver: Secondary | ICD-10-CM | POA: Insufficient documentation

## 2024-06-10 DIAGNOSIS — R188 Other ascites: Secondary | ICD-10-CM | POA: Diagnosis present

## 2024-06-18 ENCOUNTER — Other Ambulatory Visit: Payer: Self-pay | Admitting: Internal Medicine

## 2024-06-18 ENCOUNTER — Other Ambulatory Visit: Payer: Self-pay | Admitting: Cardiovascular Disease

## 2024-06-18 DIAGNOSIS — F331 Major depressive disorder, recurrent, moderate: Secondary | ICD-10-CM

## 2024-06-18 DIAGNOSIS — R188 Other ascites: Secondary | ICD-10-CM

## 2024-06-27 ENCOUNTER — Other Ambulatory Visit: Payer: Self-pay | Admitting: Internal Medicine

## 2024-06-27 ENCOUNTER — Other Ambulatory Visit: Payer: Self-pay | Admitting: Cardiovascular Disease

## 2024-07-06 ENCOUNTER — Other Ambulatory Visit: Payer: Self-pay

## 2024-07-06 DIAGNOSIS — I10 Essential (primary) hypertension: Secondary | ICD-10-CM

## 2024-07-06 DIAGNOSIS — E559 Vitamin D deficiency, unspecified: Secondary | ICD-10-CM

## 2024-07-06 DIAGNOSIS — E782 Mixed hyperlipidemia: Secondary | ICD-10-CM

## 2024-07-06 DIAGNOSIS — E0789 Other specified disorders of thyroid: Secondary | ICD-10-CM

## 2024-07-06 DIAGNOSIS — E1169 Type 2 diabetes mellitus with other specified complication: Secondary | ICD-10-CM

## 2024-07-07 LAB — CBC WITH DIFFERENTIAL/PLATELET
Basophils Absolute: 0 x10E3/uL (ref 0.0–0.2)
Basos: 1 %
EOS (ABSOLUTE): 0 x10E3/uL (ref 0.0–0.4)
Eos: 1 %
Hematocrit: 34.7 % (ref 34.0–46.6)
Hemoglobin: 10.8 g/dL — ABNORMAL LOW (ref 11.1–15.9)
Immature Grans (Abs): 0 x10E3/uL (ref 0.0–0.1)
Immature Granulocytes: 0 %
Lymphocytes Absolute: 0.8 x10E3/uL (ref 0.7–3.1)
Lymphs: 24 %
MCH: 26 pg — ABNORMAL LOW (ref 26.6–33.0)
MCHC: 31.1 g/dL — ABNORMAL LOW (ref 31.5–35.7)
MCV: 84 fL (ref 79–97)
Monocytes Absolute: 0.3 x10E3/uL (ref 0.1–0.9)
Monocytes: 11 %
Neutrophils Absolute: 2 x10E3/uL (ref 1.4–7.0)
Neutrophils: 63 %
Platelets: 77 x10E3/uL — CL (ref 150–450)
RBC: 4.15 x10E6/uL (ref 3.77–5.28)
RDW: 15 % (ref 11.7–15.4)
WBC: 3.2 x10E3/uL — ABNORMAL LOW (ref 3.4–10.8)

## 2024-07-07 LAB — LIPID PANEL
Chol/HDL Ratio: 4.1 ratio (ref 0.0–4.4)
Cholesterol, Total: 148 mg/dL (ref 100–199)
HDL: 36 mg/dL — ABNORMAL LOW (ref >39)
LDL Chol Calc (NIH): 93 mg/dL (ref 0–99)
Triglycerides: 103 mg/dL (ref 0–149)
VLDL Cholesterol Cal: 19 mg/dL (ref 5–40)

## 2024-07-07 LAB — CMP14+EGFR
ALT: 8 IU/L (ref 0–32)
AST: 21 IU/L (ref 0–40)
Albumin: 4 g/dL (ref 3.9–4.9)
Alkaline Phosphatase: 64 IU/L (ref 44–121)
BUN/Creatinine Ratio: 23 (ref 12–28)
BUN: 37 mg/dL — ABNORMAL HIGH (ref 8–27)
Bilirubin Total: 0.5 mg/dL (ref 0.0–1.2)
CO2: 21 mmol/L (ref 20–29)
Calcium: 9.5 mg/dL (ref 8.7–10.3)
Chloride: 102 mmol/L (ref 96–106)
Creatinine, Ser: 1.6 mg/dL — ABNORMAL HIGH (ref 0.57–1.00)
Globulin, Total: 3.9 g/dL (ref 1.5–4.5)
Glucose: 164 mg/dL — ABNORMAL HIGH (ref 70–99)
Potassium: 4.6 mmol/L (ref 3.5–5.2)
Sodium: 139 mmol/L (ref 134–144)
Total Protein: 7.9 g/dL (ref 6.0–8.5)
eGFR: 34 mL/min/1.73 — ABNORMAL LOW (ref >59)

## 2024-07-07 LAB — VITAMIN D 25 HYDROXY (VIT D DEFICIENCY, FRACTURES): Vit D, 25-Hydroxy: 42.7 ng/mL (ref 30.0–100.0)

## 2024-07-07 LAB — TSH: TSH: 3.1 u[IU]/mL (ref 0.450–4.500)

## 2024-07-09 ENCOUNTER — Encounter: Payer: Self-pay | Admitting: Internal Medicine

## 2024-07-09 ENCOUNTER — Ambulatory Visit (INDEPENDENT_AMBULATORY_CARE_PROVIDER_SITE_OTHER): Admitting: Internal Medicine

## 2024-07-09 VITALS — BP 125/62 | HR 79 | Ht 64.0 in | Wt 134.8 lb

## 2024-07-09 DIAGNOSIS — I10 Essential (primary) hypertension: Secondary | ICD-10-CM

## 2024-07-09 DIAGNOSIS — E1169 Type 2 diabetes mellitus with other specified complication: Secondary | ICD-10-CM | POA: Diagnosis not present

## 2024-07-09 DIAGNOSIS — Z794 Long term (current) use of insulin: Secondary | ICD-10-CM

## 2024-07-09 DIAGNOSIS — D696 Thrombocytopenia, unspecified: Secondary | ICD-10-CM

## 2024-07-09 DIAGNOSIS — F331 Major depressive disorder, recurrent, moderate: Secondary | ICD-10-CM | POA: Diagnosis not present

## 2024-07-09 MED ORDER — PEN NEEDLES 32G X 4 MM MISC: 3 refills | Status: AC

## 2024-07-09 MED ORDER — TOUJEO MAX SOLOSTAR 300 UNIT/ML ~~LOC~~ SOPN: 30.0000 [IU] | PEN_INJECTOR | Freq: Every evening | SUBCUTANEOUS | 3 refills | Status: DC

## 2024-07-09 MED ORDER — CARVEDILOL 25 MG PO TABS: 25.0000 mg | ORAL_TABLET | Freq: Two times a day (BID) | ORAL | 1 refills | Status: DC

## 2024-07-09 NOTE — Assessment & Plan Note (Addendum)
 Has seen heme-onc Likely due to underlying NASH and/or nutritional deficiencies Continue iron supplement for anemia Has easy bruising Checked CBC

## 2024-07-09 NOTE — Patient Instructions (Signed)
 Please start taking Toujeo  30 Units once daily.  Please continue to take medications as prescribed.  Please continue to follow low carb diet and perform moderate exercise/walking at least 150 mins/week.  Please get HbA1c checked after 2 weeks.

## 2024-07-09 NOTE — Progress Notes (Signed)
 Established Patient Office Visit  Subjective:  Patient ID: [name] [name], female    DOB: [birth year]  Age: 73 y.o. MRN: [id number]  CC:  Chief Complaint  Patient presents with   Hypertension    Follow up     HPI [name] [name] is a 73 y.o. female with past medical history of NASH related liver cirrhosis, portal hypertensive gastropathy, esophageal varices, HTN, AS, DM, HLD, MGUS, iron deficiency anemia, depression and anxiety who presents for f/u of her chronic medical conditions.  DM: Her HbA1c had worsened to 11.7 in 05/25 from 6.9 in 01/25. She is taking glipizide  10 mg BID and she has started taking Toujeo  20 units QD since the last visit. She admits that she still needs to improve her diet.  She did did not tolerate metformin  and Januvia  in the past.   Her blood glucose levels have improved now, but still has postmeal hyperglycemia up to 300s.  She denies any episode of hypoglycemia recently.  Denies any polyuria or polyphagia currently.  HTN: Her BP is WNL today. She takes Amlodipine  5 mg QD, Lasix  40 mg QD and Coreg  25 mg BID (for liver cirrhosis and ascites), but does not take spironolactone  due to myalgias.  She denies any headache, dizziness, chest pain, dyspnea or palpitations.  NASH related liver cirrhosis: She has recurrent ascites, had paracentesis on 05/19 -about 2.4 L fluid was removed.  Of note, she has not been taking spironolactone  and takes only 40 mg of Lasix  currently.  MDD: She has stopped taking Prozac , her excessive sweating is improved now. She was placed on Seroquel  50 mg qHS for resistant depression and insomnia.  She still complains of apathy, lack of interest in routine activities and lack of appetite, but is overall better than prior.  She still complains of anhedonia and agitation, but denies crying spells.  She also has spells of anxiety due to arguments with her husband. She denies SI or HI currently.  She has tried Zoloft , Lexapro , Effexor  and Rexulti   before.     Past Medical History:  Diagnosis Date   Allergy    Anemia, unspecified    Anxiety    Arthritis    Chronic diarrhea    Cirrhosis (HCC)    Depression    Depression    Elevated cholesterol    Enterocolitis due to Clostridium difficile, not specified as recurrent    Gastro-esophageal reflux disease with esophagitis    Helicobacter pylori gastritis 07/10/2017   Dx EGD -  1) Omeprazole  20 mg 2 times a day x 14 d 2) Pepto Bismol 2 tabs (262 mg each) 4 times a day x 14 d 3) Metronidazole  250 mg 4 times a day x 14 d 4) doxycycline  100 mg 2 times a day x 14 d  After 14 d stop omeprazole  also  In 4 weeks after treatment completed do H. Pylori stool antigen - dx H. Pylori gastritis    Hx of adenomatous colonic polyps 10/05/2015   Hyperlipemia 02/21/2009   NUC STRESS TEST-NORMAL- EF 74%   Hypertension    Iron deficiency anemia    Irritable bowel syndrome    Localized edema    Major depressive disorder, single episode, unspecified    MVP (mitral valve prolapse)    Rectocele    Thrombocytopenia (HCC)    Type 2 diabetes mellitus with hyperglycemia (HCC)     Past Surgical History:  Procedure Laterality Date   ABDOMINAL HYSTERECTOMY     BILATERAL SALPINGOOPHORECTOMY  carpel tunnel Bilateral 12/17/2009   COLONOSCOPY     ESOPHAGOGASTRODUODENOSCOPY     IR PARACENTESIS  05/03/2022   IR RADIOLOGIST EVAL & MGMT  11/12/2022   TUBAL LIGATION      Family History  Problem Relation Age of Onset   Anxiety disorder Mother    ADD / ADHD Brother    Alcohol abuse Brother        x 3   Drug abuse Brother    Anxiety disorder Maternal Aunt    Anxiety disorder Maternal Grandmother    Bipolar disorder Other    Emphysema Father    Diabetes Sister    Hyperlipidemia Sister    Cirrhosis Sister    Hyperlipidemia Sister    Diabetes Sister    Diabetes Maternal Uncle    Dementia Neg Hx    OCD Neg Hx    Paranoid behavior Neg Hx    Physical abuse Neg Hx    Schizophrenia Neg Hx     Seizures Neg Hx    Sexual abuse Neg Hx    Colon cancer Neg Hx    Colon polyps Neg Hx    Pancreatic cancer Neg Hx    Esophageal cancer Neg Hx    Stomach cancer Neg Hx    Kidney disease Neg Hx    Liver disease Neg Hx    Rectal cancer Neg Hx     Social History   Socioeconomic History   Marital status: Married    Spouse name: Darina   Number of children: 2   Years of education: Not on file   Highest education level: Not on file  Occupational History   Not on file  Tobacco Use   Smoking status: Former    Current packs/day: 0.00    Types: Cigarettes    Start date: 09/04/1967    Quit date: 09/03/1997    Years since quitting: 26.8    Passive exposure: Past   Smokeless tobacco: Former    Types: Snuff    Quit date: 05/2022   Tobacco comments:    call when ready to quit    06/14/2022 patient stated she quit 5 or so weeks ago  Vaping Use   Vaping status: Never Used  Substance and Sexual Activity   Alcohol use: No    Alcohol/week: 0.0 standard drinks of alcohol   Drug use: No   Sexual activity: Not on file  Other Topics Concern   Not on file  Social History Narrative   Married 2 sons 1 lives in [place]  1 lives in [place]   She is a housewife   No alcohol tobacco or drug use at this time   Former smoker quit in 1998   Social Drivers of Health   Financial Resource Strain: Low Risk  (11/20/2023)   Overall Financial Resource Strain (CARDIA)    Difficulty of Paying Living Expenses: Not hard at all  Food Insecurity: No Food Insecurity (11/20/2023)   Hunger Vital Sign    Worried About Running Out of Food in the Last Year: Never true    Ran Out of Food in the Last Year: Never true  Transportation Needs: No Transportation Needs (11/20/2023)   PRAPARE - Administrator, Civil Service (Medical): No    Lack of Transportation (Non-Medical): No  Physical Activity: Inactive (11/20/2023)   Exercise Vital Sign    Days of Exercise per Week: 0 days    Minutes of  Exercise per Session: 0 min  Stress: No Stress  Concern Present (11/20/2023)   [employer] of Occupational Health - Occupational Stress Questionnaire    Feeling of Stress : Not at all  Social Connections: Socially Integrated (11/20/2023)   Social Connection and Isolation Panel    Frequency of Communication with Friends and Family: More than three times a week    Frequency of Social Gatherings with Friends and Family: More than three times a week    Attends Religious Services: More than 4 times per year    Active Member of [employer] or Organizations: Yes    Attends Engineer, structural: More than 4 times per year    Marital Status: Married  Catering manager Violence: Not At Risk (11/20/2023)   Humiliation, Afraid, Rape, and Kick questionnaire    Fear of Current or Ex-Partner: No    Emotionally Abused: No    Physically Abused: No    Sexually Abused: No    Outpatient Medications Prior to Visit  Medication Sig Dispense Refill   amLODipine  (NORVASC ) 5 MG tablet TAKE 1 TABLET (5 MG TOTAL) BY MOUTH DAILY. 90 tablet 1   baclofen  (LIORESAL ) 10 MG tablet TAKE 1 TABLET BY MOUTH TWICE A DAY AS NEEDED FOR MUSCLE SPASMS 180 tablet 1   BIOTIN PO Take 500 mg by mouth daily.     Blood Glucose Monitoring Suppl DEVI 1 each by Does not apply route in the morning, at noon, and at bedtime. May substitute to any manufacturer covered by patient's insurance. 1 each 0   ferrous sulfate 325 (65 FE) MG EC tablet Take 325 mg by mouth 2 (two) times daily. One daily (Patient taking differently: Take 325 mg by mouth daily with breakfast. One daily)     furosemide  (LASIX ) 40 MG tablet TAKE 1 TABLET BY MOUTH EVERY DAY 90 tablet 1   glipiZIDE  (GLUCOTROL ) 10 MG tablet TAKE 1 TABLET(10MG  TOTAL) BY MOUTH DAILY BEFORE BREAKFAST (Patient taking differently: Take 10 mg by mouth 2 (two) times daily before a meal.) 90 tablet 1   Glucagon  (GVOKE HYPOPEN  2-PACK) 1 MG/0.2ML SOAJ Inject 1 mg into the skin as needed (For blood  glucose < 53). 0.4 mL 5   Glucose Blood (BLOOD GLUCOSE TEST STRIPS) STRP 1 each by In Vitro route in the morning, at noon, and at bedtime. May substitute to any manufacturer covered by patient's insurance. 100 strip 3   ondansetron  (ZOFRAN ) 4 MG tablet Take 1 tablet (4 mg total) by mouth every 8 (eight) hours as needed for nausea or vomiting. 20 tablet 0   QUEtiapine  (SEROQUEL ) 50 MG tablet TAKE 1 TABLET BY MOUTH TWICE A DAY (Patient taking differently: Take 50 mg by mouth at bedtime.) 180 tablet 1   rosuvastatin  (CRESTOR ) 5 MG tablet TAKE 1 TABLET (5 MG TOTAL) BY MOUTH DAILY. TAKES ONE ON MONDAY, WED., AND FRIDAY 45 tablet 1   triamcinolone  cream (KENALOG ) 0.1 % APPLY TO AFFECTED AREA TWICE A DAY 30 g 0   carvedilol  (COREG ) 25 MG tablet TAKE 1 TABLET BY MOUTH TWICE A DAY 30 tablet 0   Continuous Glucose Receiver (FREESTYLE LIBRE 3 READER) DEVI Use it to check blood glucose as instructed. (Patient not taking: Reported on 05/28/2024) 1 each 0   Continuous Glucose Sensor (FREESTYLE LIBRE 3 PLUS SENSOR) MISC Change sensor every 15 days. (Patient not taking: Reported on 05/28/2024) 6 each 1   insulin  glargine, 2 Unit Dial, (TOUJEO  MAX SOLOSTAR) 300 UNIT/ML Solostar Pen INJECT 20 UNITS INTO THE SKIN EVERY EVENING. 3 mL 3  Insulin  Pen Needle (PEN NEEDLES) 32G X 4 MM MISC Use as directed 4 times a day for injecting insulin  100 each 2   spironolactone  (ALDACTONE ) 25 MG tablet Take 1 tablet (25 mg total) by mouth daily. 90 tablet 3   No facility-administered medications prior to visit.    Allergies  Allergen Reactions   Codeine Other (See Comments)    Took it years ago and doesn't remember the reaction   Spironolactone  Other (See Comments)    Reports intolerance , states unable to take the medication    ROS Review of Systems  Constitutional:  Positive for fatigue. Negative for chills and fever.  HENT:  Negative for congestion, sinus pressure, sinus pain and sore throat.   Eyes:  Negative for pain  and discharge.  Respiratory:  Negative for cough and shortness of breath.   Cardiovascular:  Negative for chest pain and palpitations.  Gastrointestinal:  Negative for diarrhea, nausea and vomiting.  Endocrine: Negative for polydipsia and polyuria.  Genitourinary:  Negative for dysuria and hematuria.  Musculoskeletal:  Positive for arthralgias. Negative for neck pain and neck stiffness.  Skin:  Negative for rash.  Neurological:  Negative for dizziness and weakness.  Psychiatric/Behavioral:  Positive for dysphoric mood and sleep disturbance. Negative for agitation and behavioral problems. The patient is nervous/anxious.       Objective:    Physical Exam Vitals reviewed.  Constitutional:      General: She is not in acute distress.    Appearance: She is not diaphoretic.  HENT:     Head: Normocephalic and atraumatic.     Nose: Nose normal.     Mouth/Throat:     Mouth: Mucous membranes are moist.  Eyes:     General: No scleral icterus.    Extraocular Movements: Extraocular movements intact.  Cardiovascular:     Rate and Rhythm: Normal rate and regular rhythm.     Pulses: Normal pulses.     Heart sounds: Murmur (Systolic over left upper sternal border) heard.  Pulmonary:     Breath sounds: Normal breath sounds. No wheezing or rales.  Abdominal:     General: There is no distension.     Palpations: Abdomen is soft.  Musculoskeletal:     Right shoulder: No swelling. Decreased range of motion.     Cervical back: Neck supple. No tenderness.     Right lower leg: No edema.     Left lower leg: No edema.  Skin:    General: Skin is warm.     Findings: No rash.  Neurological:     General: No focal deficit present.     Mental Status: She is alert and oriented to person, place, and time.  Psychiatric:        Mood and Affect: Mood normal.        Behavior: Behavior normal.     BP 125/62   Pulse 79   Ht 5' 4 (1.626 m)   Wt 134 lb 12.8 oz (61.1 kg)   SpO2 98%   BMI 23.14 kg/m   Wt Readings from Last 3 Encounters:  07/09/24 134 lb 12.8 oz (61.1 kg)  05/28/24 136 lb (61.7 kg)  04/27/24 134 lb (60.8 kg)    Lab Results  Component Value Date   TSH 3.100 07/06/2024   Lab Results  Component Value Date   WBC 3.2 (L) 07/06/2024   HGB 10.8 (L) 07/06/2024   HCT 34.7 07/06/2024   MCV 84 07/06/2024   PLT 77 (LL)  07/06/2024   Lab Results  Component Value Date   NA 139 07/06/2024   K 4.6 07/06/2024   CO2 21 07/06/2024   GLUCOSE 164 (H) 07/06/2024   BUN 37 (H) 07/06/2024   CREATININE 1.60 (H) 07/06/2024   BILITOT 0.5 07/06/2024   ALKPHOS 64 07/06/2024   AST 21 07/06/2024   ALT 8 07/06/2024   PROT 7.9 07/06/2024   ALBUMIN  4.0 07/06/2024   CALCIUM  9.5 07/06/2024   ANIONGAP 7 11/29/2021   EGFR 34 (L) 07/06/2024   GFR 48.42 (L) 04/25/2022   Lab Results  Component Value Date   CHOL 148 07/06/2024   Lab Results  Component Value Date   HDL 36 (L) 07/06/2024   Lab Results  Component Value Date   LDLCALC 93 07/06/2024   Lab Results  Component Value Date   TRIG 103 07/06/2024   Lab Results  Component Value Date   CHOLHDL 4.1 07/06/2024   Lab Results  Component Value Date   HGBA1C 11.7 (H) 04/22/2024      Assessment & Plan:   Problem List Items Addressed This Visit       Cardiovascular and Mediastinum   Essential hypertension   BP Readings from Last 1 Encounters:  07/09/24 125/62   Well-controlled with Amlodipine  5 mg QD, Coreg  25 mg BID and Lasix  40 mg QD On Lasix  40 mg daily for cirrhosis related ascites, but she has stopped spironolactone  due to myalgias Counseled for compliance with the medications Advised DASH diet and moderate exercise/walking, at least 150 mins/week      Relevant Medications   carvedilol  (COREG ) 25 MG tablet     Endocrine   Type 2 diabetes mellitus with other specified complication (HCC) - Primary   Lab Results  Component Value Date   HGBA1C 11.7 (H) 04/22/2024   Associated with HTN and HLD On Glipizide   10 mg BID and Toujeo  20 U QD, increased dose of Toujeo  to 30 U QD Uncontrolled due to inconsistent diet Did not tolerate Metformin  and Januvia  in the past Considering her history of liver cirrhosis and chronic nausea, would avoid GLP-1 agonist therapy Strictly advised to check blood glucose regularly and contact if blood glucose more than 300 or less than 70  She would benefit from CGM - sent freestyle libre 3 plus, but she does not wish to use it for now Gvoke hypopen  prescribed for hypoglycemia Advised to follow diabetic diet On ACEi and statin F/u CMP and HbA1c Diabetic eye exam: Advised to follow up with Ophthalmology for diabetic eye exam      Relevant Medications   Insulin  Pen Needle (PEN NEEDLES) 32G X 4 MM MISC   insulin  glargine, 2 Unit Dial, (TOUJEO  MAX SOLOSTAR) 300 UNIT/ML Solostar Pen   Other Relevant Orders   Bayer DCA Hb A1c Waived     Hematopoietic and Hemostatic   Thrombocytopenia   Has seen heme-onc Likely due to underlying NASH and/or nutritional deficiencies Continue iron supplement for anemia Has easy bruising Checked CBC        Other   Major depressive disorder, recurrent episode, moderate (HCC)   Flowsheet Row Office Visit from 07/09/2024 in [facility]  PHQ-9 Total Score 4   Overall better controlled now Continue Seroquel  50 mg at bedtime, can take second dose as needed for agitation DC Prozac  as she had excessive sweating - concern for serotonin syndrome Her major concern is that her husband remains busy with his cars and does not spend time with  her.  No current concern of physical or verbal abuse.  Had lengthy discussion to find alternative activities, she denies BH therapy as she wants her husband to get therapy         Meds ordered this encounter  Medications   Insulin  Pen Needle (PEN NEEDLES) 32G X 4 MM MISC    Sig: Use as directed 4 times a day for injecting insulin     Dispense:  100 each    Refill:  3    No  brand preference. Okay to dispense generic alternative preferred by insurance.   carvedilol  (COREG ) 25 MG tablet    Sig: Take 1 tablet (25 mg total) by mouth 2 (two) times daily.    Dispense:  180 tablet    Refill:  1   insulin  glargine, 2 Unit Dial, (TOUJEO  MAX SOLOSTAR) 300 UNIT/ML Solostar Pen    Sig: Inject 30 Units into the skin every evening.    Dispense:  3 mL    Refill:  3    Follow-up: Return in about 4 months (around 11/09/2024) for DM.    Suzzane MARLA Blanch, MD

## 2024-07-09 NOTE — Assessment & Plan Note (Addendum)
 Lab Results  Component Value Date   HGBA1C 11.7 (H) 04/22/2024   Associated with HTN and HLD On Glipizide  10 mg BID and Toujeo  20 U QD, increased dose of Toujeo  to 30 U QD Uncontrolled due to inconsistent diet Did not tolerate Metformin  and Januvia  in the past Considering her history of liver cirrhosis and chronic nausea, would avoid GLP-1 agonist therapy Strictly advised to check blood glucose regularly and contact if blood glucose more than 300 or less than 70  She would benefit from CGM - sent freestyle libre 3 plus, but she does not wish to use it for now Gvoke hypopen  prescribed for hypoglycemia Advised to follow diabetic diet On ACEi and statin F/u CMP and HbA1c Diabetic eye exam: Advised to follow up with Ophthalmology for diabetic eye exam

## 2024-07-09 NOTE — Assessment & Plan Note (Signed)
 BP Readings from Last 1 Encounters:  07/09/24 125/62   Well-controlled with Amlodipine  5 mg QD, Coreg  25 mg BID and Lasix  40 mg QD On Lasix  40 mg daily for cirrhosis related ascites, but she has stopped spironolactone  due to myalgias Counseled for compliance with the medications Advised DASH diet and moderate exercise/walking, at least 150 mins/week

## 2024-07-09 NOTE — Assessment & Plan Note (Signed)
 Flowsheet Row Office Visit from 07/09/2024 in [facility]  PHQ-9 Total Score 4   Overall better controlled now Continue Seroquel  50 mg at bedtime, can take second dose as needed for agitation DC Prozac  as she had excessive sweating - concern for serotonin syndrome Her major concern is that her husband remains busy with his cars and does not spend time with her.  No current concern of physical or verbal abuse.  Had lengthy discussion to find alternative activities, she denies BH therapy as she wants her husband to get therapy

## 2024-07-31 ENCOUNTER — Other Ambulatory Visit: Payer: Self-pay | Admitting: Internal Medicine

## 2024-07-31 DIAGNOSIS — E1169 Type 2 diabetes mellitus with other specified complication: Secondary | ICD-10-CM

## 2024-07-31 MED ORDER — TOUJEO MAX SOLOSTAR 300 UNIT/ML ~~LOC~~ SOPN: 36.0000 [IU] | PEN_INJECTOR | Freq: Every evening | SUBCUTANEOUS | 3 refills | Status: DC

## 2024-08-01 LAB — BAYER DCA HB A1C WAIVED: HB A1C (BAYER DCA - WAIVED): 8.6 % — ABNORMAL HIGH (ref 4.8–5.6)

## 2024-09-30 ENCOUNTER — Emergency Department (HOSPITAL_COMMUNITY): Admission: EM | Admit: 2024-09-30 | Discharge: 2024-09-30 | Disposition: A | Discharge status: Home / Self Care

## 2024-09-30 ENCOUNTER — Encounter (HOSPITAL_COMMUNITY): Payer: Self-pay

## 2024-09-30 ENCOUNTER — Ambulatory Visit: Payer: Self-pay

## 2024-09-30 ENCOUNTER — Other Ambulatory Visit: Payer: Self-pay

## 2024-09-30 DIAGNOSIS — R739 Hyperglycemia, unspecified: Secondary | ICD-10-CM | POA: Diagnosis present

## 2024-09-30 DIAGNOSIS — R5383 Other fatigue: Secondary | ICD-10-CM | POA: Diagnosis not present

## 2024-09-30 DIAGNOSIS — Z79899 Other long term (current) drug therapy: Secondary | ICD-10-CM | POA: Insufficient documentation

## 2024-09-30 DIAGNOSIS — Z7984 Long term (current) use of oral hypoglycemic drugs: Secondary | ICD-10-CM | POA: Diagnosis not present

## 2024-09-30 DIAGNOSIS — I10 Essential (primary) hypertension: Secondary | ICD-10-CM | POA: Insufficient documentation

## 2024-09-30 DIAGNOSIS — N390 Urinary tract infection, site not specified: Secondary | ICD-10-CM | POA: Insufficient documentation

## 2024-09-30 DIAGNOSIS — Z794 Long term (current) use of insulin: Secondary | ICD-10-CM | POA: Diagnosis not present

## 2024-09-30 LAB — CBC WITH DIFFERENTIAL/PLATELET
Abs Immature Granulocytes: 0.02 K/uL (ref 0.00–0.07)
Basophils Absolute: 0.1 K/uL (ref 0.0–0.1)
Basophils Relative: 1 %
Eosinophils Absolute: 0 K/uL (ref 0.0–0.5)
Eosinophils Relative: 1 %
HCT: 34.6 % — ABNORMAL LOW (ref 36.0–46.0)
Hemoglobin: 11.5 g/dL — ABNORMAL LOW (ref 12.0–15.0)
Immature Granulocytes: 1 %
Lymphocytes Relative: 19 %
Lymphs Abs: 0.7 K/uL (ref 0.7–4.0)
MCH: 27.5 pg (ref 26.0–34.0)
MCHC: 33.2 g/dL (ref 30.0–36.0)
MCV: 82.8 fL (ref 80.0–100.0)
Monocytes Absolute: 0.3 K/uL (ref 0.1–1.0)
Monocytes Relative: 8 %
Neutro Abs: 2.8 K/uL (ref 1.7–7.7)
Neutrophils Relative %: 70 %
Platelets: 89 K/uL — ABNORMAL LOW (ref 150–400)
RBC: 4.18 MIL/uL (ref 3.87–5.11)
RDW: 14.8 % (ref 11.5–15.5)
WBC: 3.9 K/uL — ABNORMAL LOW (ref 4.0–10.5)
nRBC: 0 % (ref 0.0–0.2)

## 2024-09-30 LAB — URINALYSIS, ROUTINE W REFLEX MICROSCOPIC
Bilirubin Urine: NEGATIVE
Glucose, UA: NEGATIVE mg/dL
Ketones, ur: NEGATIVE mg/dL
Nitrite: NEGATIVE
Protein, ur: NEGATIVE mg/dL
Specific Gravity, Urine: 1.008 (ref 1.005–1.030)
pH: 6 (ref 5.0–8.0)

## 2024-09-30 LAB — COMPREHENSIVE METABOLIC PANEL WITH GFR
ALT: 9 U/L (ref 0–44)
AST: 25 U/L (ref 15–41)
Albumin: 4.3 g/dL (ref 3.5–5.0)
Alkaline Phosphatase: 65 U/L (ref 38–126)
Anion gap: 11 (ref 5–15)
BUN: 40 mg/dL — ABNORMAL HIGH (ref 8–23)
CO2: 27 mmol/L (ref 22–32)
Calcium: 10.1 mg/dL (ref 8.9–10.3)
Chloride: 99 mmol/L (ref 98–111)
Creatinine, Ser: 1.8 mg/dL — ABNORMAL HIGH (ref 0.44–1.00)
GFR, Estimated: 30 mL/min — ABNORMAL LOW
Glucose, Bld: 227 mg/dL — ABNORMAL HIGH (ref 70–99)
Potassium: 4.2 mmol/L (ref 3.5–5.1)
Sodium: 136 mmol/L (ref 135–145)
Total Bilirubin: 0.6 mg/dL (ref 0.0–1.2)
Total Protein: 8.6 g/dL — ABNORMAL HIGH (ref 6.5–8.1)

## 2024-09-30 LAB — CBG MONITORING, ED: Glucose-Capillary: 230 mg/dL — ABNORMAL HIGH (ref 70–99)

## 2024-09-30 LAB — HEMOGLOBIN A1C
Hgb A1c MFr Bld: 9.3 % — ABNORMAL HIGH (ref 4.8–5.6)
Mean Plasma Glucose: 220.21 mg/dL

## 2024-09-30 MED ORDER — LACTATED RINGERS IV BOLUS
500.0000 mL | Freq: Once | INTRAVENOUS | Status: AC
  Administered 2024-09-30: 500 mL via INTRAVENOUS

## 2024-09-30 MED ORDER — LOSARTAN POTASSIUM 25 MG PO TABS: 25.0000 mg | ORAL_TABLET | Freq: Every day | ORAL | 0 refills | Status: DC

## 2024-09-30 MED ORDER — CEPHALEXIN 500 MG PO CAPS: 500.0000 mg | ORAL_CAPSULE | Freq: Four times a day (QID) | ORAL | 0 refills | Status: AC

## 2024-09-30 NOTE — ED Triage Notes (Signed)
 Pt arrived via POV c/o hyperglycemia. Pt reports checking her CBG at home and reports a reading of 500. Pt reports calling her PCP and being advised to come to the ER for evaluation. Pts CBG in Triage is 230. Pt endorses fatigue as well.

## 2024-09-30 NOTE — Telephone Encounter (Signed)
 Noted

## 2024-09-30 NOTE — Discharge Instructions (Signed)
 Take your antibiotics as prescribed.  Stop your amlodipine  and start your losartan .  Call and follow-up with your primary care doctor.  Keep a close eye on your blood sugar at home and remember to check it daily.

## 2024-09-30 NOTE — ED Provider Notes (Signed)
 [name] EMERGENCY DEPARTMENT AT [facility] Provider Note   CSN: [id number] Arrival date & time: 09/30/24  1257     Patient presents with: Hyperglycemia   [name] [name] is a 73 y.o. female.  {Add pertinent medical, surgical, social history, OB history to HPI:7175} 73 year old female presents for evaluation of elevated blood sugar.  States it was 500 when she checked it today.  States has been compliant with her medications she takes glipizide  and insulin  but states she has not been checking her blood sugar as recommended.  She states that since she started norvasc  her blood sugars have been out of control. Denies any other symptoms or concerns.    Hyperglycemia Associated symptoms: no abdominal pain, no chest pain, no dysuria, no fever, no shortness of breath and no vomiting        Prior to Admission medications   Medication Sig Start Date End Date Taking? Authorizing Provider  amLODipine  (NORVASC ) 5 MG tablet TAKE 1 TABLET (5 MG TOTAL) BY MOUTH DAILY. 06/01/24   Tobie Suzzane POUR, MD  baclofen  (LIORESAL ) 10 MG tablet TAKE 1 TABLET BY MOUTH TWICE A DAY AS NEEDED FOR MUSCLE SPASMS 01/14/23   Tobie Suzzane POUR, MD  BIOTIN PO Take 500 mg by mouth daily.    [provider]  Blood Glucose Monitoring Suppl DEVI 1 each by Does not apply route in the morning, at noon, and at bedtime. May substitute to any manufacturer covered by patient's insurance. 10/09/23   Tobie Suzzane POUR, MD  carvedilol  (COREG ) 25 MG tablet Take 1 tablet (25 mg total) by mouth 2 (two) times daily. 07/09/24   Tobie Suzzane POUR, MD  ferrous sulfate 325 (65 FE) MG EC tablet Take 325 mg by mouth 2 (two) times daily. One daily Patient taking differently: Take 325 mg by mouth daily with breakfast. One daily    [provider]  furosemide  (LASIX ) 40 MG tablet TAKE 1 TABLET BY MOUTH EVERY DAY 06/22/24   Tobie Suzzane POUR, MD  glipiZIDE  (GLUCOTROL ) 10 MG tablet TAKE 1 TABLET (10 MG TOTAL) BY MOUTH TWICE  A DAY BEFORE A MEAL 07/31/24   Tobie Suzzane POUR, MD  Glucagon  (GVOKE HYPOPEN  2-PACK) 1 MG/0.2ML SOAJ Inject 1 mg into the skin as needed (For blood glucose < 53). 10/09/23   Tobie Suzzane POUR, MD  Glucose Blood (BLOOD GLUCOSE TEST STRIPS) STRP 1 each by In Vitro route in the morning, at noon, and at bedtime. May substitute to any manufacturer covered by patient's insurance. 10/09/23   Tobie Suzzane POUR, MD  insulin  glargine, 2 Unit Dial, (TOUJEO  MAX SOLOSTAR) 300 UNIT/ML Solostar Pen Inject 36 Units into the skin every evening. 07/31/24   Tobie Suzzane POUR, MD  Insulin  Pen Needle (PEN NEEDLES) 32G X 4 MM MISC Use as directed 4 times a day for injecting insulin  07/09/24   Tobie Suzzane POUR, MD  ondansetron  (ZOFRAN ) 4 MG tablet Take 1 tablet (4 mg total) by mouth every 8 (eight) hours as needed for nausea or vomiting. 09/07/22   Tobie Suzzane POUR, MD  QUEtiapine  (SEROQUEL ) 50 MG tablet TAKE 1 TABLET BY MOUTH TWICE A DAY Patient taking differently: Take 50 mg by mouth at bedtime. 04/15/24   Tobie Suzzane POUR, MD  rosuvastatin  (CRESTOR ) 5 MG tablet TAKE 1 TABLET (5 MG TOTAL) BY MOUTH DAILY. TAKES ONE ON MONDAY, WED., AND FRIDAY 05/18/24   Tobie Suzzane POUR, MD  triamcinolone  cream (KENALOG ) 0.1 % APPLY TO AFFECTED AREA TWICE A DAY 10/16/21  Tobie Suzzane POUR, MD    Allergies: Codeine and Spironolactone     Review of Systems  Constitutional:  Negative for chills and fever.  HENT:  Negative for ear pain and sore throat.   Eyes:  Negative for pain and visual disturbance.  Respiratory:  Negative for cough and shortness of breath.   Cardiovascular:  Negative for chest pain and palpitations.  Gastrointestinal:  Negative for abdominal pain and vomiting.  Genitourinary:  Negative for dysuria and hematuria.  Musculoskeletal:  Negative for arthralgias and back pain.  Skin:  Negative for color change and rash.  Neurological:  Negative for seizures and syncope.  All other systems reviewed and are negative.   Updated Vital  Signs BP (!) 126/58   Pulse 60   Temp (!) 97.4 F (36.3 C) (Temporal)   Resp 18   Ht 5' 4 (1.626 m)   Wt 61.1 kg   SpO2 99%   BMI 23.12 kg/m   Physical Exam Vitals and nursing note reviewed.  Constitutional:      General: She is not in acute distress.    Appearance: Normal appearance. She is well-developed. She is not ill-appearing.  HENT:     Head: Normocephalic and atraumatic.  Eyes:     Conjunctiva/sclera: Conjunctivae normal.  Cardiovascular:     Rate and Rhythm: Normal rate and regular rhythm.     Heart sounds: No murmur heard. Pulmonary:     Effort: Pulmonary effort is normal. No respiratory distress.     Breath sounds: Normal breath sounds.  Abdominal:     Palpations: Abdomen is soft.     Tenderness: There is no abdominal tenderness.  Musculoskeletal:        General: No swelling.     Cervical back: Neck supple.  Skin:    General: Skin is warm and dry.     Capillary Refill: Capillary refill takes less than 2 seconds.  Neurological:     General: No focal deficit present.     Mental Status: She is alert.  Psychiatric:        Mood and Affect: Mood normal.     (all labs ordered are listed, but only abnormal results are displayed) Labs Reviewed  CBG MONITORING, ED - Abnormal; Notable for the following components:      Result Value   Glucose-Capillary 230 (*)    All other components within normal limits  URINALYSIS, ROUTINE W REFLEX MICROSCOPIC  CBC WITH DIFFERENTIAL/PLATELET  COMPREHENSIVE METABOLIC PANEL WITH GFR  HEMOGLOBIN A1C    EKG: None  Radiology: No results found.  {Document cardiac monitor, telemetry assessment procedure when appropriate:32947} Procedures   Medications Ordered in the ED  lactated ringers bolus 500 mL (has no administration in time range)      {Click here for ABCD2, HEART and other calculators REFRESH Note before signing:1}                              Medical Decision Making Amount and/or Complexity of Data  Reviewed Labs: ordered.   ***  {Document critical care time when appropriate  Document review of labs and clinical decision tools ie CHADS2VASC2, etc  Document your independent review of radiology images and any outside records  Document your discussion with family members, caretakers and with consultants  Document social determinants of health affecting pt's care  Document your decision making why or why not admission, treatments were needed:32947:::1}   Final diagnoses:  None  ED Discharge Orders     None

## 2024-09-30 NOTE — Telephone Encounter (Signed)
 FYI Only or Action Required?: FYI only for provider.  Patient was last seen in primary care on 07/09/2024 by Tobie Suzzane POUR, MD.  Called Nurse Triage reporting Blood Sugar Problem.  Symptoms began yesterday.  Interventions attempted: Nothing.  Symptoms are: gradually worsening.  Triage Disposition: Go to ED Now (or PCP Triage)  Patient/caregiver understands and will follow disposition?: Yes      Copied from CRM #[id number]. Topic: Clinical - Red Word Triage >> Sep 30, 2024 12:06 PM Sophia H wrote: Red Word that prompted transfer to Nurse Triage: Patient states she hasn't been feeling well, checked blood sugar and it is reading 500 Reason for Disposition  Blood glucose > 500 mg/dL (72.1 mmol/L)  Answer Assessment - Initial Assessment Questions 1. BLOOD GLUCOSE: What is your blood glucose level?      500 2. ONSET: When did you check the blood glucose?     Around 1200 today 3. USUAL RANGE: What is your glucose level usually? (e.g., usual fasting morning value, usual evening value)     Unsure she states she does not check her blood sugar regularly  4. TYPE 1 or 2:  Do you know what type of diabetes you have?  (e.g., Type 1, Type 2, Gestational; doesn't know)      Type 2  5. INSULIN : Do you take insulin ? What type of insulin (s) do you use? What is the mode of delivery? (syringe, pen; injection or pump)?      Yes, takes insulin   6. DIABETES PILLS: Do you take any pills for your diabetes? If Yes, ask: Have you missed taking any pills recently?     Glipizide  denies missing doses   7. OTHER SYMPTOMS: Do you have any symptoms? (e.g., fever, frequent urination, difficulty breathing, dizziness, weakness, vomiting)     Fatigue and body aches in shoulder/ feet.  Protocols used: Diabetes - High Blood Sugar-A-AH

## 2024-10-02 ENCOUNTER — Ambulatory Visit: Payer: Self-pay

## 2024-10-02 ENCOUNTER — Other Ambulatory Visit: Payer: Self-pay | Admitting: Internal Medicine

## 2024-10-02 DIAGNOSIS — E1169 Type 2 diabetes mellitus with other specified complication: Secondary | ICD-10-CM

## 2024-10-02 NOTE — Telephone Encounter (Signed)
 FYI Only or Action Required?: FYI only for provider.  Patient was last seen in primary care on 07/09/2024 by [name] Suzzane POUR, MD.  Called Nurse Triage reporting Blood Sugar Problem.  Symptoms began several weeks ago.  Interventions attempted: Prescription medications: insulin ; glipizide .  Symptoms are: unchanged.  Triage Disposition: See Physician Within 24 Hours  Patient/caregiver understands and will follow disposition?: No, refuses disposition  Copied from CRM #[id number]. Topic: Clinical - Red Word Triage >> Oct 02, 2024  4:28 PM Delon DASEN wrote: Red Word that prompted transfer to Nurse Triage: blood sugar 316 Reason for Disposition  [1] Symptoms of high blood sugar (e.g., increased thirst, frequent urination, weight loss) AND [2] not able to test blood glucose  Answer Assessment - Initial Assessment Questions Pt was in ER earlier this week with a glucose of >500. States this afternoon she took two glipizides and it was still 361 today. Has not taken her insulin  yet today, but she doesn't normally until the afternoon. Pt requesting appointment w/ pcp, but earliest availability at the clinic is Oct 23rd. RN recommended seeing provider within 24 hrs, but pt states she will take the app on the 23rd instead. RN educated on ER escalation sxs and pt is agreeable. Pt agrees to check glucose more often and keep log.   1. BLOOD GLUCOSE: What is your blood glucose level?      361 2. ONSET: When did you check the blood glucose?     A few minutes ago 3. USUAL RANGE: What is your glucose level usually? (e.g., usual fasting morning value, usual evening value)     Unsure she doesn't normally check them 4. KETONES: Do you check for ketones (urine or blood test strips)? If Yes, ask: What does the test show now?      denies 5. TYPE 1 or 2:  Do you know what type of diabetes you have?  (e.g., Type 1, Type 2, Gestational; doesn't know)      Type 2 6. INSULIN : Do you take insulin ? What  type of insulin (s) do you use? What is the mode of delivery? (syringe, pen; injection or pump)?      Yes; insulin  glargine 7. DIABETES PILLS: Do you take any pills for your diabetes? If Yes, ask: Have you missed taking any pills recently?     Glipizide ;  8. OTHER SYMPTOMS: Do you have any symptoms? (e.g., fever, frequent urination, difficulty breathing, dizziness, weakness, vomiting)     Increased urination; also has mild UTI per provider and taking abx. Keflex; day 2  Protocols used: Diabetes - High Blood Sugar-A-AH

## 2024-10-05 NOTE — Telephone Encounter (Signed)
 Spoke to patient scheduled 10/21 at 1140

## 2024-10-06 ENCOUNTER — Ambulatory Visit (INDEPENDENT_AMBULATORY_CARE_PROVIDER_SITE_OTHER): Admitting: Internal Medicine

## 2024-10-06 ENCOUNTER — Encounter: Payer: Self-pay | Admitting: Internal Medicine

## 2024-10-06 VITALS — BP 102/60 | HR 58 | Ht 64.0 in | Wt 134.2 lb

## 2024-10-06 DIAGNOSIS — Z09 Encounter for follow-up examination after completed treatment for conditions other than malignant neoplasm: Secondary | ICD-10-CM

## 2024-10-06 DIAGNOSIS — Z794 Long term (current) use of insulin: Secondary | ICD-10-CM

## 2024-10-06 DIAGNOSIS — D509 Iron deficiency anemia, unspecified: Secondary | ICD-10-CM

## 2024-10-06 DIAGNOSIS — E1169 Type 2 diabetes mellitus with other specified complication: Secondary | ICD-10-CM

## 2024-10-06 DIAGNOSIS — I10 Essential (primary) hypertension: Secondary | ICD-10-CM | POA: Diagnosis not present

## 2024-10-06 DIAGNOSIS — F331 Major depressive disorder, recurrent, moderate: Secondary | ICD-10-CM

## 2024-10-06 MED ORDER — FERROUS SULFATE 325 (65 FE) MG PO TBEC: 325.0000 mg | DELAYED_RELEASE_TABLET | Freq: Two times a day (BID) | ORAL | 5 refills | Status: AC

## 2024-10-06 MED ORDER — TOUJEO MAX SOLOSTAR 300 UNIT/ML ~~LOC~~ SOPN: 40.0000 [IU] | PEN_INJECTOR | Freq: Every evening | SUBCUTANEOUS | 3 refills | Status: DC

## 2024-10-06 MED ORDER — LANCETS MISC: 1.0000 | 2 refills | Status: DC

## 2024-10-06 NOTE — Assessment & Plan Note (Signed)
 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

## 2024-10-06 NOTE — Patient Instructions (Addendum)
 Please stop taking Amlodipine . Please take Losartan  only half tablet once daily for now.  Please start taking Toujeo  40 Units once daily.  Please follow low carb diet and ambulate as tolerated.

## 2024-10-06 NOTE — Progress Notes (Unsigned)
 Established Patient Office Visit  Subjective:  Patient ID: [name] [name], female    DOB: 1951/11/18  Age: 73 y.o. MRN: [id number]  CC:  Chief Complaint  Patient presents with   Diabetes    Hyperglycemia f/u , reports feeling fatigued, ongoing since last Wednesday. Also would like an rx for iron pill.     HPI [name] [name] is a 74 y.o. female with past medical history of NASH related liver cirrhosis, portal hypertensive gastropathy, esophageal varices, HTN, AS, DM, HLD, MGUS, iron deficiency anemia, depression and anxiety who presents for f/u of recent ER visit on 09/30/24.  She went to ER for hyperglycemia.  She had blood glucose above 500 at home, after which she took the insulin  and went to ER.  Her blood glucose was 227 in the ER.  She was given IV fluids.  Of note, her blood pressure medicine was switched from amlodipine  to losartan  by ER.  She was also given Keflex for possible UTI.  She reports that she had a large blueberry muffin over the last weekend, which led to another episode of hyperglycemia up to 500s.  She agrees to avoid sweets and soft drinks now.  DM: Her HbA1c had worsened to 9.3 recently. She is taking glipizide  10 mg BID and she has started taking Toujeo  36 units QD since the last visit. She admits that she still needs to improve her diet.  She did did not tolerate metformin  and Januvia  in the past.   Her blood glucose levels have improved now, but still has postmeal hyperglycemia up to 300s.  She denies any episode of hypoglycemia recently.  Denies any polyuria or polyphagia currently.  HTN: Her BP is WNL today. She takes losartan  25 mg QD, Lasix  40 mg QD and Coreg  25 mg BID (for liver cirrhosis and ascites), but does not take spironolactone  due to myalgias.  She denies any headache, dizziness, chest pain, dyspnea or palpitations.  NASH related liver cirrhosis: She has recurrent ascites, had paracentesis on 05/19 -about 2.4 L fluid was removed.  Of note, she has  not been taking spironolactone  and takes only 40 mg of Lasix  currently.  MDD: She has stopped taking Prozac , her excessive sweating is improved now. She was placed on Seroquel  50 mg qHS for resistant depression and insomnia.  She still complains of apathy, lack of interest in routine activities and lack of appetite, but is overall better than prior.  She still complains of anhedonia and agitation, but denies crying spells.  She also has spells of anxiety due to arguments with her husband. She denies SI or HI currently.  She has tried Zoloft , Lexapro , Effexor  and Rexulti  before.     Past Medical History:  Diagnosis Date   Allergy    Anemia, unspecified    Anxiety    Arthritis    Chronic diarrhea    Cirrhosis (HCC)    Depression    Depression    Elevated cholesterol    Enterocolitis due to Clostridium difficile, not specified as recurrent    Gastro-esophageal reflux disease with esophagitis    Helicobacter pylori gastritis 07/10/2017   Dx EGD -  1) Omeprazole  20 mg 2 times a day x 14 d 2) Pepto Bismol 2 tabs (262 mg each) 4 times a day x 14 d 3) Metronidazole  250 mg 4 times a day x 14 d 4) doxycycline  100 mg 2 times a day x 14 d  After 14 d stop omeprazole  also  In 4  weeks after treatment completed do H. Pylori stool antigen - dx H. Pylori gastritis    Hx of adenomatous colonic polyps 10/05/2015   Hyperlipemia 02/21/2009   NUC STRESS TEST-NORMAL- EF 74%   Hypertension    Iron deficiency anemia    Irritable bowel syndrome    Localized edema    Major depressive disorder, single episode, unspecified    MVP (mitral valve prolapse)    Rectocele    Thrombocytopenia    Type 2 diabetes mellitus with hyperglycemia (HCC)     Past Surgical History:  Procedure Laterality Date   ABDOMINAL HYSTERECTOMY     BILATERAL SALPINGOOPHORECTOMY     carpel tunnel Bilateral 12/17/2009   COLONOSCOPY     ESOPHAGOGASTRODUODENOSCOPY     IR PARACENTESIS  05/03/2022   IR RADIOLOGIST EVAL & MGMT  11/12/2022    TUBAL LIGATION      Family History  Problem Relation Age of Onset   Anxiety disorder Mother    ADD / ADHD Brother    Alcohol abuse Brother        x 3   Drug abuse Brother    Anxiety disorder Maternal Aunt    Anxiety disorder Maternal Grandmother    Bipolar disorder Other    Emphysema Father    Diabetes Sister    Hyperlipidemia Sister    Cirrhosis Sister    Hyperlipidemia Sister    Diabetes Sister    Diabetes Maternal Uncle    Dementia Neg Hx    OCD Neg Hx    Paranoid behavior Neg Hx    Physical abuse Neg Hx    Schizophrenia Neg Hx    Seizures Neg Hx    Sexual abuse Neg Hx    Colon cancer Neg Hx    Colon polyps Neg Hx    Pancreatic cancer Neg Hx    Esophageal cancer Neg Hx    Stomach cancer Neg Hx    Kidney disease Neg Hx    Liver disease Neg Hx    Rectal cancer Neg Hx     Social History   Socioeconomic History   Marital status: Married    Spouse name: Darina   Number of children: 2   Years of education: Not on file   Highest education level: Not on file  Occupational History   Not on file  Tobacco Use   Smoking status: Former    Current packs/day: 0.00    Types: Cigarettes    Start date: 09/04/1967    Quit date: 09/03/1997    Years since quitting: 27.1    Passive exposure: Past   Smokeless tobacco: Former    Types: Snuff    Quit date: 05/2022   Tobacco comments:    call when ready to quit    06/14/2022 patient stated she quit 5 or so weeks ago  Vaping Use   Vaping status: Never Used  Substance and Sexual Activity   Alcohol use: No    Alcohol/week: 0.0 standard drinks of alcohol   Drug use: No   Sexual activity: Not on file  Other Topics Concern   Not on file  Social History Narrative   Married 2 sons 1 lives in [place]  1 lives in [place]   She is a housewife   No alcohol tobacco or drug use at this time   Former smoker quit in 1998   Social Drivers of Health   Financial Resource Strain: Low Risk  (11/20/2023)   Overall Financial  Resource Strain (CARDIA)  Difficulty of Paying Living Expenses: Not hard at all  Food Insecurity: No Food Insecurity (11/20/2023)   Hunger Vital Sign    Worried About Running Out of Food in the Last Year: Never true    Ran Out of Food in the Last Year: Never true  Transportation Needs: No Transportation Needs (11/20/2023)   PRAPARE - Administrator, Civil Service (Medical): No    Lack of Transportation (Non-Medical): No  Physical Activity: Inactive (11/20/2023)   Exercise Vital Sign    Days of Exercise per Week: 0 days    Minutes of Exercise per Session: 0 min  Stress: No Stress Concern Present (11/20/2023)   [employer] of Occupational Health - Occupational Stress Questionnaire    Feeling of Stress : Not at all  Social Connections: Socially Integrated (11/20/2023)   Social Connection and Isolation Panel    Frequency of Communication with Friends and Family: More than three times a week    Frequency of Social Gatherings with Friends and Family: More than three times a week    Attends Religious Services: More than 4 times per year    Active Member of [employer] or Organizations: Yes    Attends Engineer, structural: More than 4 times per year    Marital Status: Married  Catering manager Violence: Not At Risk (11/20/2023)   Humiliation, Afraid, Rape, and Kick questionnaire    Fear of Current or Ex-Partner: No    Emotionally Abused: No    Physically Abused: No    Sexually Abused: No    Outpatient Medications Prior to Visit  Medication Sig Dispense Refill   baclofen  (LIORESAL ) 10 MG tablet TAKE 1 TABLET BY MOUTH TWICE A DAY AS NEEDED FOR MUSCLE SPASMS 180 tablet 1   BIOTIN PO Take 500 mg by mouth daily.     Blood Glucose Monitoring Suppl DEVI 1 each by Does not apply route in the morning, at noon, and at bedtime. May substitute to any manufacturer covered by patient's insurance. 1 each 0   Calcium  Carb-Cholecalciferol (CALCIUM  600 + D PO) Take 1 tablet by mouth every  evening.     carvedilol  (COREG ) 25 MG tablet Take 1 tablet (25 mg total) by mouth 2 (two) times daily. 180 tablet 1   furosemide  (LASIX ) 40 MG tablet TAKE 1 TABLET BY MOUTH EVERY DAY 90 tablet 1   glipiZIDE  (GLUCOTROL ) 10 MG tablet TAKE 1 TABLET (10 MG TOTAL) BY MOUTH TWICE A DAY BEFORE A MEAL 180 tablet 1   Glucagon  (GVOKE HYPOPEN  2-PACK) 1 MG/0.2ML SOAJ Inject 1 mg into the skin as needed (For blood glucose < 53). 0.4 mL 5   glucose blood (ACCU-CHEK GUIDE TEST) test strip USE AS DIRECTED 100 strip 0   Insulin  Pen Needle (PEN NEEDLES) 32G X 4 MM MISC Use as directed 4 times a day for injecting insulin  100 each 3   losartan  (COZAAR ) 25 MG tablet Take 1 tablet (25 mg total) by mouth daily. 30 tablet 0   ondansetron  (ZOFRAN ) 4 MG tablet Take 1 tablet (4 mg total) by mouth every 8 (eight) hours as needed for nausea or vomiting. 20 tablet 0   QUEtiapine  (SEROQUEL ) 50 MG tablet TAKE 1 TABLET BY MOUTH TWICE A DAY (Patient taking differently: Take 50 mg by mouth daily.) 180 tablet 1   rosuvastatin  (CRESTOR ) 5 MG tablet TAKE 1 TABLET (5 MG TOTAL) BY MOUTH DAILY. TAKES ONE ON MONDAY, WED., AND FRIDAY 45 tablet 1   triamcinolone  cream (KENALOG )  0.1 % APPLY TO AFFECTED AREA TWICE A DAY 30 g 0   amLODipine  (NORVASC ) 5 MG tablet TAKE 1 TABLET (5 MG TOTAL) BY MOUTH DAILY. 90 tablet 1   ferrous sulfate 325 (65 FE) MG EC tablet Take 325 mg by mouth 2 (two) times daily. One daily (Patient taking differently: Take 325 mg by mouth every evening. One daily)     insulin  glargine, 2 Unit Dial, (TOUJEO  MAX SOLOSTAR) 300 UNIT/ML Solostar Pen Inject 36 Units into the skin every evening. 3 mL 3   No facility-administered medications prior to visit.    Allergies  Allergen Reactions   Codeine Other (See Comments)    Took it years ago and doesn't remember the reaction   Spironolactone  Other (See Comments)    Reports intolerance , states unable to take the medication    ROS Review of Systems  Constitutional:   Positive for fatigue. Negative for chills and fever.  HENT:  Negative for congestion, sinus pressure, sinus pain and sore throat.   Eyes:  Negative for pain and discharge.  Respiratory:  Negative for cough and shortness of breath.   Cardiovascular:  Negative for chest pain and palpitations.  Gastrointestinal:  Negative for diarrhea, nausea and vomiting.  Endocrine: Negative for polydipsia and polyuria.  Genitourinary:  Negative for dysuria and hematuria.  Musculoskeletal:  Positive for arthralgias. Negative for neck pain and neck stiffness.  Skin:  Negative for rash.  Neurological:  Negative for dizziness and weakness.  Psychiatric/Behavioral:  Positive for dysphoric mood and sleep disturbance. Negative for agitation and behavioral problems. The patient is nervous/anxious.       Objective:    Physical Exam Vitals reviewed.  Constitutional:      General: She is not in acute distress.    Appearance: She is not diaphoretic.  HENT:     Head: Normocephalic and atraumatic.     Nose: Nose normal.     Mouth/Throat:     Mouth: Mucous membranes are moist.  Eyes:     General: No scleral icterus.    Extraocular Movements: Extraocular movements intact.  Cardiovascular:     Rate and Rhythm: Normal rate and regular rhythm.     Pulses: Normal pulses.     Heart sounds: Murmur (Systolic over left upper sternal border) heard.  Pulmonary:     Breath sounds: Normal breath sounds. No wheezing or rales.  Musculoskeletal:     Right shoulder: No swelling. Decreased range of motion.     Cervical back: Neck supple. No tenderness.     Right lower leg: No edema.     Left lower leg: No edema.  Skin:    General: Skin is warm.     Findings: No rash.  Neurological:     General: No focal deficit present.     Mental Status: She is alert and oriented to person, place, and time.  Psychiatric:        Mood and Affect: Mood normal.        Behavior: Behavior normal.     BP 102/60   Pulse (!) 58   Ht 5'  4 (1.626 m)   Wt 134 lb 3.2 oz (60.9 kg)   SpO2 100%   BMI 23.04 kg/m  Wt Readings from Last 3 Encounters:  10/06/24 134 lb 3.2 oz (60.9 kg)  09/30/24 134 lb 11.2 oz (61.1 kg)  07/09/24 134 lb 12.8 oz (61.1 kg)    Lab Results  Component Value Date   TSH 3.100 07/06/2024  Lab Results  Component Value Date   WBC 3.9 (L) 09/30/2024   HGB 11.5 (L) 09/30/2024   HCT 34.6 (L) 09/30/2024   MCV 82.8 09/30/2024   PLT 89 (L) 09/30/2024   Lab Results  Component Value Date   NA 136 09/30/2024   K 4.2 09/30/2024   CO2 27 09/30/2024   GLUCOSE 227 (H) 09/30/2024   BUN 40 (H) 09/30/2024   CREATININE 1.80 (H) 09/30/2024   BILITOT 0.6 09/30/2024   ALKPHOS 65 09/30/2024   AST 25 09/30/2024   ALT 9 09/30/2024   PROT 8.6 (H) 09/30/2024   ALBUMIN  4.3 09/30/2024   CALCIUM  10.1 09/30/2024   ANIONGAP 11 09/30/2024   EGFR 34 (L) 07/06/2024   GFR 48.42 (L) 04/25/2022   Lab Results  Component Value Date   CHOL 148 07/06/2024   Lab Results  Component Value Date   HDL 36 (L) 07/06/2024   Lab Results  Component Value Date   LDLCALC 93 07/06/2024   Lab Results  Component Value Date   TRIG 103 07/06/2024   Lab Results  Component Value Date   CHOLHDL 4.1 07/06/2024   Lab Results  Component Value Date   HGBA1C 9.3 (H) 09/30/2024      Assessment & Plan:   Problem List Items Addressed This Visit       Cardiovascular and Mediastinum   Essential hypertension   BP Readings from Last 1 Encounters:  10/06/24 102/60   Tightly controlled with losartan  25 mg QD, Coreg  25 mg BID and Lasix  40 mg QD Amlodipine  was recently discontinued and was placed on losartan , advised to take only half tablet of losartan  for now On Lasix  40 mg daily for cirrhosis related ascites, but she has stopped spironolactone  due to myalgias Counseled for compliance with the medications Advised DASH diet and moderate exercise/walking, at least 150 mins/week        Endocrine   Type 2 diabetes mellitus  with other specified complication (HCC)   Lab Results  Component Value Date   HGBA1C 9.3 (H) 09/30/2024   Associated with HTN and HLD On Glipizide  10 mg BID and Toujeo  36 U QD, increased dose of Toujeo  to 40 U QD Uncontrolled due to inconsistent diet, but agrees to avoid sweets and soft drinks Advised to increase fluid intake and take about 15 minutes walk if she has hyperglycemia above 250 after drinking water Did not tolerate Metformin  and Januvia  in the past Considering her history of liver cirrhosis and chronic nausea, would avoid GLP-1 agonist therapy Strictly advised to check blood glucose regularly and contact if blood glucose more than 300 or less than 70  She would benefit from CGM - sent freestyle libre 3 plus, but she does not wish to use it for now Gvoke hypopen  prescribed for hypoglycemia Advised to follow diabetic diet On ACEi and statin F/u CMP and HbA1c Diabetic eye exam: Advised to follow up with Ophthalmology for diabetic eye exam      Relevant Medications   insulin  glargine, 2 Unit Dial, (TOUJEO  MAX SOLOSTAR) 300 UNIT/ML Solostar Pen   Lancets MISC   Other Relevant Orders   Referral to Nutrition and Diabetes Services     Other   Iron deficiency anemia   Hb stable around 11 now Has seen heme-onc Likely due to underlying NASH and/or nutritional deficiencies Continue iron supplement for anemia Has easy bruising      Relevant Medications   ferrous sulfate 325 (65 FE) MG EC tablet   Major  depressive disorder, recurrent episode, moderate (HCC)   Flowsheet Row Office Visit from 10/06/2024 in [facility]  PHQ-9 Total Score 0   Overall better controlled now Continue Seroquel  50 mg at bedtime, can take second dose as needed for agitation DC Prozac  as she had excessive sweating - concern for serotonin syndrome Her major concern is that her husband remains busy with his cars and does not spend time with her.  No current concern of physical  or verbal abuse.  Had lengthy discussion to find alternative activities, she denies BH therapy as she wants her husband to get therapy      Encounter for examination following treatment at hospital - Primary   ER chart reviewed, including blood tests Episode of hyperglycemia due to diet noncompliance - agrees to improve diet Advised to increase water intake and cut down sweets and soft drinks         Meds ordered this encounter  Medications   insulin  glargine, 2 Unit Dial, (TOUJEO  MAX SOLOSTAR) 300 UNIT/ML Solostar Pen    Sig: Inject 40 Units into the skin every evening.    Dispense:  3 mL    Refill:  3   ferrous sulfate 325 (65 FE) MG EC tablet    Sig: Take 1 tablet (325 mg total) by mouth 2 (two) times daily.    Dispense:  60 tablet    Refill:  5   Lancets MISC    Sig: 1 each by Does not apply route as directed. Dispense based on patient and insurance preference. Use up to four times daily as directed. (FOR ICD-10 E10.9, E11.9).    Dispense:  100 each    Refill:  2    Follow-up: Return if symptoms worsen or fail to improve.    Suzzane MARLA Blanch, MD

## 2024-10-08 ENCOUNTER — Ambulatory Visit: Payer: Self-pay | Admitting: Family Medicine

## 2024-10-09 DIAGNOSIS — Z09 Encounter for follow-up examination after completed treatment for conditions other than malignant neoplasm: Secondary | ICD-10-CM | POA: Insufficient documentation

## 2024-10-09 NOTE — Assessment & Plan Note (Signed)
 ER chart reviewed, including blood tests Episode of hyperglycemia due to diet noncompliance - agrees to improve diet Advised to increase water intake and cut down sweets and soft drinks

## 2024-10-09 NOTE — Assessment & Plan Note (Signed)
 BP Readings from Last 1 Encounters:  10/06/24 102/60   Tightly controlled with losartan  25 mg QD, Coreg  25 mg BID and Lasix  40 mg QD Amlodipine  was recently discontinued and was placed on losartan , advised to take only half tablet of losartan  for now On Lasix  40 mg daily for cirrhosis related ascites, but she has stopped spironolactone  due to myalgias Counseled for compliance with the medications Advised DASH diet and moderate exercise/walking, at least 150 mins/week

## 2024-10-09 NOTE — Assessment & Plan Note (Signed)
 Flowsheet Row Office Visit from 10/06/2024 in [facility]  PHQ-9 Total Score 0   Overall better controlled now Continue Seroquel  50 mg at bedtime, can take second dose as needed for agitation DC Prozac  as she had excessive sweating - concern for serotonin syndrome Her major concern is that her husband remains busy with his cars and does not spend time with her.  No current concern of physical or verbal abuse.  Had lengthy discussion to find alternative activities, she denies BH therapy as she wants her husband to get therapy

## 2024-10-09 NOTE — Assessment & Plan Note (Addendum)
 Hb stable around 11 now Has seen heme-onc Likely due to underlying NASH and/or nutritional deficiencies Continue iron supplement for anemia Has easy bruising

## 2024-10-22 ENCOUNTER — Encounter (INDEPENDENT_AMBULATORY_CARE_PROVIDER_SITE_OTHER): Payer: Self-pay | Admitting: Gastroenterology

## 2024-10-26 ENCOUNTER — Encounter: Attending: Internal Medicine | Admitting: Nutrition

## 2024-10-26 VITALS — Ht 63.0 in | Wt 132.0 lb

## 2024-10-26 DIAGNOSIS — E1169 Type 2 diabetes mellitus with other specified complication: Secondary | ICD-10-CM | POA: Diagnosis present

## 2024-10-26 NOTE — Progress Notes (Unsigned)
 Medical Nutrition Therapy  Appointment Start time:  1300  Appointment End time:  1430  Primary concerns today: DM Type 2  Referral diagnosis: E11.8 Preferred learning style: No Preference  Learning readiness: Ready  NUTRITION ASSESSMENT  73 yr old white female here for Type 2 DM education. PCP Dr. Tobie. A1C 9.3%. Recently in hospital with hyperglycemia. She thinks her Norvasc  has effected her blood sugars. Doesn't check blood sugars regularly. Doesn't have a CGM.  Did have a UTI from high blood sugars and treated with antibiotics. FBS.200's.It doesn't matter what I do, my morning blood sugars are always about 200's. PMH  Depression, Cirrhosis NASH, Hyperlipidemia, HTN, anxiety, anemia, anxiety, Type 2 DM. Glipizide  10 mg, Toujeo  Max 40 units. She notes sometimes if her blood sugar in lower at bedtime, she may reduce the amount of Toujeo  she take on her own. Eats randomly 2 meals per day. Skips breakfast. Typically sleeps in and doesn't eat til 11 am or so. Snacks throughout the day. LDL HIgh at 105 mg/dl, needs to be below 70 mg/dl with DM. BMI 23. Creatine high at 1.8 mg/dl. eGFR 34. CKD Stg 3b.  She is willing to work on eating better balanced meals consistently and taking medications as prescribed. Sample Dexcom given and put on her in office today. Will follow up in 2 weeks.   CGM Training:    Intervention:   Understanding Glucose Sensing Programming Sensor Information  High Glucose: 325 mg/dl  Low Glucose 80 mg/dl  Other settings to be added at follow up visit Starting [employer] of Product  Entering BG, Calibration Technique and Graphs with Public Librarian and Alarms   Clinical Medical Hx:  Past Medical History:  Diagnosis Date   Allergy    Anemia, unspecified    Anxiety    Arthritis    Chronic diarrhea    Cirrhosis (HCC)    Depression    Depression    Elevated cholesterol    Enterocolitis due to  Clostridium difficile, not specified as recurrent    Gastro-esophageal reflux disease with esophagitis    Helicobacter pylori gastritis 07/10/2017   Dx EGD -  1) Omeprazole  20 mg 2 times a day x 14 d 2) Pepto Bismol 2 tabs (262 mg each) 4 times a day x 14 d 3) Metronidazole  250 mg 4 times a day x 14 d 4) doxycycline  100 mg 2 times a day x 14 d  After 14 d stop omeprazole  also  In 4 weeks after treatment completed do H. Pylori stool antigen - dx H. Pylori gastritis    Hx of adenomatous colonic polyps 10/05/2015   Hyperlipemia 02/21/2009   NUC STRESS TEST-NORMAL- EF 74%   Hypertension    Iron deficiency anemia    Irritable bowel syndrome    Localized edema    Major depressive disorder, single episode, unspecified    MVP (mitral valve prolapse)    Rectocele    Thrombocytopenia    Type 2 diabetes mellitus with hyperglycemia (HCC)     Medications: Current Outpatient Medications on File Prior to Visit  Medication Sig Dispense Refill   baclofen  (LIORESAL ) 10 MG tablet TAKE 1 TABLET BY MOUTH TWICE A DAY AS NEEDED FOR MUSCLE SPASMS 180 tablet 1   BIOTIN PO Take 500 mg by mouth daily.     Blood Glucose Monitoring Suppl DEVI 1 each by Does not apply route in the morning, at noon, and at bedtime. May substitute to any  manufacturer covered by [employer]. 1 each 0   Calcium  Carb-Cholecalciferol (CALCIUM  600 + D PO) Take 1 tablet by mouth every evening.     carvedilol  (COREG ) 25 MG tablet Take 1 tablet (25 mg total) by mouth 2 (two) times daily. 180 tablet 1   ferrous sulfate 325 (65 FE) MG EC tablet Take 1 tablet (325 mg total) by mouth 2 (two) times daily. 60 tablet 5   furosemide  (LASIX ) 40 MG tablet TAKE 1 TABLET BY MOUTH EVERY DAY 90 tablet 1   glipiZIDE  (GLUCOTROL ) 10 MG tablet TAKE 1 TABLET (10 MG TOTAL) BY MOUTH TWICE A DAY BEFORE A MEAL 180 tablet 1   Glucagon  (GVOKE HYPOPEN  2-PACK) 1 MG/0.2ML SOAJ Inject 1 mg into the skin as needed (For blood glucose < 53). 0.4 mL 5   glucose blood  (ACCU-CHEK GUIDE TEST) test strip USE AS DIRECTED 100 strip 0   insulin  glargine, 2 Unit Dial, (TOUJEO  MAX SOLOSTAR) 300 UNIT/ML Solostar Pen Inject 40 Units into the skin every evening. 3 mL 3   Insulin  Pen Needle (PEN NEEDLES) 32G X 4 MM MISC Use as directed 4 times a day for injecting insulin  100 each 3   Lancets MISC 1 each by Does not apply route as directed. Dispense based on patient and insurance preference. Use up to four times daily as directed. (FOR ICD-10 E10.9, E11.9). 100 each 2   losartan  (COZAAR ) 25 MG tablet Take 1 tablet (25 mg total) by mouth daily. 30 tablet 0   ondansetron  (ZOFRAN ) 4 MG tablet Take 1 tablet (4 mg total) by mouth every 8 (eight) hours as needed for nausea or vomiting. 20 tablet 0   QUEtiapine  (SEROQUEL ) 50 MG tablet TAKE 1 TABLET BY MOUTH TWICE A DAY (Patient taking differently: Take 50 mg by mouth daily.) 180 tablet 1   rosuvastatin  (CRESTOR ) 5 MG tablet TAKE 1 TABLET (5 MG TOTAL) BY MOUTH DAILY. TAKES ONE ON MONDAY, WED., AND FRIDAY 45 tablet 1   triamcinolone  cream (KENALOG ) 0.1 % APPLY TO AFFECTED AREA TWICE A DAY 30 g 0   No current facility-administered medications on file prior to visit.    Labs:     Latest Ref Rng & Units 09/30/2024    1:20 PM 07/06/2024    8:26 AM 04/22/2024    8:59 AM  CMP  Glucose 70 - 99 mg/dL 772  835  782   BUN 8 - 23 mg/dL 40  37  35   Creatinine 0.44 - 1.00 mg/dL 8.19  8.39  8.59   Sodium 135 - 145 mmol/L 136  139  136   Potassium 3.5 - 5.1 mmol/L 4.2  4.6  4.9   Chloride 98 - 111 mmol/L 99  102  98   CO2 22 - 32 mmol/L 27  21  25    Calcium  8.9 - 10.3 mg/dL 89.8  9.5  9.3   Total Protein 6.5 - 8.1 g/dL 8.6  7.9  7.9   Total Bilirubin 0.0 - 1.2 mg/dL 0.6  0.5  0.5   Alkaline Phos 38 - 126 U/L 65  64  67   AST 15 - 41 U/L 25  21  23    ALT 0 - 44 U/L 9  8  7     Lipid Panel     Component Value Date/Time   CHOL 148 07/06/2024 0826   TRIG 103 07/06/2024 0826   HDL 36 (L) 07/06/2024 0826   CHOLHDL 4.1 07/06/2024 0826    CHOLHDL  2.9 04/16/2018 0732   VLDL 27 09/13/2014 0730   LDLCALC 93 07/06/2024 0826   LDLCALC 105 (H) 04/16/2018 0732   LABVLDL 19 07/06/2024 0826   Lab Results  Component Value Date   HGBA1C 9.3 (H) 09/30/2024    Notable Signs/Symptoms: Eats ice  Lifestyle & Dietary Hx LIves with her husband  Estimated daily fluid intake:  oz Supplements:  Sleep: poor Stress / self-care: some Current average weekly physical activity: ADL  24-Hr Dietary Recall Eats 1-2 meals per day. Snacks. Doesn't eat breakfast usually. Doesn't eat til 11 am or so.  Estimated Energy Needs Calories: 1500-1800 Carbohydrate: 170g Protein: 110g Fat: 42g   NUTRITION DIAGNOSIS  NB-1.1 Food and nutrition-related knowledge deficit As related to inconsistent food intake with balanced CHO and medication compliance.  As evidenced by A1C  9.3% and  diet recall.[name] [name]   NUTRITION INTERVENTION  Nutrition education (E-1) on the following topics:  Nutrition and Diabetes education provided on My Plate, CHO counting, meal planning, portion sizes, timing of meals, avoiding snacks between meals unless having a low blood sugar, target ranges for A1C and blood sugars, signs/symptoms and treatment of hyper/hypoglycemia, monitoring blood sugars, taking medications as prescribed, benefits of exercising 30 minutes per day and prevention of complications of DM.  Lifestyle Medicine  - Whole Food, Plant Predominant Nutrition is highly recommended: Eat Plenty of vegetables, Mushrooms, fruits, Legumes, Whole Grains, Nuts, seeds in lieu of processed meats, processed snacks/pastries red meat, poultry, eggs.    -It is better to avoid simple carbohydrates including: Cakes, Sweet Desserts, Ice Cream, Soda (diet and regular), Sweet Tea, Candies, Chips, Cookies, Store Bought Juices, Alcohol in Excess of  1-2 drinks a day, Lemonade,  Artificial Sweeteners, Doughnuts, Coffee Creamers, Sugar-free Products, etc, etc.  This is not a complete  list.....  Exercise: If you are able: 30 -60 minutes a day ,4 days a week, or 150 minutes a week.  The longer the better.  Combine stretch, strength, and aerobic activities.  If you were told in the past that you have high risk for cardiovascular diseases, you may seek evaluation by your heart doctor prior to initiating moderate to intense exercise programs.     Handouts Provided Include  My Plate Lifestyle Medicine handouts  Learning Style & Readiness for Change Teaching method utilized: Visual & Auditory  Demonstrated degree of understanding via: Teach Back  Barriers to learning/adherence to lifestyle change: mental health of depression and anxiety  Goals Established by Pt Goals  Eat three meals per day  B) 8 Lunch 12-2 and Dinner 5-7 pm Take Toujeo  8 pm nightly. Don't skip meals Drink more water Keep walking Wear the Dexcom for 2 weeks.   MONITORING & EVALUATION Dietary intake, weekly physical activity, and blood sugars in 2 weeks. May benefit from seeing a counselor to help with depression and anxiety. Needs prescription for Dexcom if she does well with it for the next 2 weeks. Next Steps  Patient is to work on eating 3 balanced meals on times discussed.[name] [name]

## 2024-10-26 NOTE — Patient Instructions (Signed)
 Goals  Eat three meals per day  B) 8 Lunch 12-2 and Dinner 5-7 pm Take Toujeo  8 pm nightly. Don't skip meals Drink more water Keep walking Wear the Dexcom for 2 weeks.

## 2024-10-27 ENCOUNTER — Encounter (INDEPENDENT_AMBULATORY_CARE_PROVIDER_SITE_OTHER): Payer: Self-pay | Admitting: Gastroenterology

## 2024-11-03 ENCOUNTER — Encounter: Payer: Self-pay | Admitting: Nutrition

## 2024-11-05 ENCOUNTER — Other Ambulatory Visit: Payer: Self-pay

## 2024-11-05 DIAGNOSIS — E782 Mixed hyperlipidemia: Secondary | ICD-10-CM

## 2024-11-06 LAB — LIPID PANEL
Chol/HDL Ratio: 6.4 ratio — ABNORMAL HIGH (ref 0.0–4.4)
Cholesterol, Total: 216 mg/dL — ABNORMAL HIGH (ref 100–199)
HDL: 34 mg/dL — ABNORMAL LOW (ref >39)
LDL Chol Calc (NIH): 157 mg/dL — ABNORMAL HIGH (ref 0–99)
Triglycerides: 134 mg/dL (ref 0–149)
VLDL Cholesterol Cal: 25 mg/dL (ref 5–40)

## 2024-11-09 ENCOUNTER — Other Ambulatory Visit: Payer: Self-pay | Admitting: Internal Medicine

## 2024-11-09 DIAGNOSIS — E782 Mixed hyperlipidemia: Secondary | ICD-10-CM

## 2024-11-10 ENCOUNTER — Ambulatory Visit (INDEPENDENT_AMBULATORY_CARE_PROVIDER_SITE_OTHER): Admitting: Internal Medicine

## 2024-11-10 ENCOUNTER — Encounter: Admitting: Nutrition

## 2024-11-10 ENCOUNTER — Encounter: Payer: Self-pay | Admitting: Internal Medicine

## 2024-11-10 VITALS — BP 108/63 | HR 60 | Ht 64.0 in | Wt 131.6 lb

## 2024-11-10 DIAGNOSIS — I10 Essential (primary) hypertension: Secondary | ICD-10-CM | POA: Diagnosis not present

## 2024-11-10 DIAGNOSIS — Z23 Encounter for immunization: Secondary | ICD-10-CM

## 2024-11-10 DIAGNOSIS — F331 Major depressive disorder, recurrent, moderate: Secondary | ICD-10-CM

## 2024-11-10 DIAGNOSIS — Z794 Long term (current) use of insulin: Secondary | ICD-10-CM

## 2024-11-10 DIAGNOSIS — E1169 Type 2 diabetes mellitus with other specified complication: Secondary | ICD-10-CM | POA: Diagnosis not present

## 2024-11-10 DIAGNOSIS — E782 Mixed hyperlipidemia: Secondary | ICD-10-CM

## 2024-11-10 DIAGNOSIS — K3189 Other diseases of stomach and duodenum: Secondary | ICD-10-CM

## 2024-11-10 DIAGNOSIS — R188 Other ascites: Secondary | ICD-10-CM

## 2024-11-10 DIAGNOSIS — K7460 Unspecified cirrhosis of liver: Secondary | ICD-10-CM

## 2024-11-10 DIAGNOSIS — K766 Portal hypertension: Secondary | ICD-10-CM

## 2024-11-10 MED ORDER — LANCETS MISC
1.0000 | 2 refills | Status: AC
Start: 2024-11-10 — End: ?

## 2024-11-10 MED ORDER — BLOOD GLUCOSE TEST VI STRP: 1.0000 | ORAL_STRIP | 2 refills | Status: AC

## 2024-11-10 MED ORDER — TOUJEO MAX SOLOSTAR 300 UNIT/ML ~~LOC~~ SOPN: 48.0000 [IU] | PEN_INJECTOR | Freq: Every evening | SUBCUTANEOUS | 3 refills | Status: AC

## 2024-11-10 MED ORDER — PRAVASTATIN SODIUM 10 MG PO TABS: 10.0000 mg | ORAL_TABLET | ORAL | 1 refills | Status: AC

## 2024-11-10 MED ORDER — BLOOD GLUCOSE MONITORING SUPPL DEVI
1.0000 | 0 refills | Status: AC
Start: 2024-11-10 — End: ?

## 2024-11-10 NOTE — Patient Instructions (Addendum)
 Please start taking Toujeo  48 Units instead of 40 Units.  Please start taking Glipizide  5 mg in the evening with meal and continue taking 10 mg in the morning.  Please start taking Pravastatin  every other day instead of Crestor .  Please continue to take medications as prescribed.  Please continue to follow low carb diet and perform moderate exercise/walking as tolerated.  Please get fasting blood tests done before the next visit.

## 2024-11-10 NOTE — Assessment & Plan Note (Signed)
 Had paracentesis (05/24) - 2.4 liters of clear fluid removed Has recurrence of ascites, had discussed about portacaval shunt versus repeated paracentesis with GI - but has been better since taking Lasix  regularly  Last EGD showed portal hypertensive gastropathy and esophageal varices On B-blocker for ppx On Lasix  40 mg QD, did not tolerate spironolactone 
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
## 2024-11-10 NOTE — Assessment & Plan Note (Signed)
On Coreg now ?Followed by GI ?

## 2024-11-10 NOTE — Progress Notes (Signed)
 Established Patient Office Visit  Subjective:  Patient ID: [name] [name], female    DOB: [birth year]  Age: 73 y.o. MRN: [id number]  CC:  Chief Complaint  Patient presents with   Diabetes    4 month f/u     HPI [name] [name] is a 73 y.o. female with past medical history of NASH related liver cirrhosis, portal hypertensive gastropathy, esophageal varices, HTN, AS, DM, HLD, MGUS, iron deficiency anemia, depression and anxiety who presents for f/u of her chronic medical conditions.  DM: Her HbA1c had improved to 9.3 in 10/25 from 11.7 in 05/25. She is taking glipizide  10 mg BID and she has been taking Toujeo  40 units QD since the last visit. She admits that she still needs to improve her diet.  She did did not tolerate metformin  and Januvia  in the past.  She met with the nutritionist, was given CGM, but has taken out of the sensor now due to repeated alarms of hyperglycemia.  Her blood glucose levels have improved compared to prior, but still has postmeal hyperglycemia up to 250s.  She denies any episode of hypoglycemia recently.  Denies any polyuria or polyphagia currently.  HTN: Her BP is WNL today. She takes Lasix  40 mg QD and Coreg  25 mg BID (for liver cirrhosis and ascites). She did not take spironolactone  due to myalgias.  She denies any headache, dizziness, chest pain, dyspnea or palpitations.  NASH related liver cirrhosis: She has recurrent ascites, had paracentesis on 05/05/23 -about 2.4 L fluid was removed.  Of note, she has not been taking spironolactone  and takes only 40 mg of Lasix  currently.  MDD: She has stopped taking Prozac , her excessive sweating is improved now. She was placed on Seroquel  50 mg qHS for resistant depression and insomnia.  She still complains of apathy, lack of interest in routine activities and lack of appetite, but is overall better than prior.  She still complains of anhedonia and agitation, but denies crying spells.  She also has spells of anxiety due to  arguments with her husband. She denies SI or HI currently.  She has tried Zoloft , Lexapro , Effexor  and Rexulti  before.     Past Medical History:  Diagnosis Date   Allergy    Anemia, unspecified    Anxiety    Arthritis    Chronic diarrhea    Cirrhosis (HCC)    Depression    Depression    Elevated cholesterol    Enterocolitis due to Clostridium difficile, not specified as recurrent    Gastro-esophageal reflux disease with esophagitis    Helicobacter pylori gastritis 07/10/2017   Dx EGD -  1) Omeprazole  20 mg 2 times a day x 14 d 2) Pepto Bismol 2 tabs (262 mg each) 4 times a day x 14 d 3) Metronidazole  250 mg 4 times a day x 14 d 4) doxycycline  100 mg 2 times a day x 14 d  After 14 d stop omeprazole  also  In 4 weeks after treatment completed do H. Pylori stool antigen - dx H. Pylori gastritis    Hx of adenomatous colonic polyps 10/05/2015   Hyperlipemia 02/21/2009   NUC STRESS TEST-NORMAL- EF 74%   Hypertension    Iron deficiency anemia    Irritable bowel syndrome    Localized edema    Major depressive disorder, single episode, unspecified    MVP (mitral valve prolapse)    Rectocele    Thrombocytopenia    Type 2 diabetes mellitus with hyperglycemia (HCC)  Past Surgical History:  Procedure Laterality Date   ABDOMINAL HYSTERECTOMY     BILATERAL SALPINGOOPHORECTOMY     carpel tunnel Bilateral 12/17/2009   COLONOSCOPY     ESOPHAGOGASTRODUODENOSCOPY     IR PARACENTESIS  05/03/2022   IR RADIOLOGIST EVAL & MGMT  11/12/2022   TUBAL LIGATION      Family History  Problem Relation Age of Onset   Anxiety disorder Mother    ADD / ADHD Brother    Alcohol abuse Brother        x 3   Drug abuse Brother    Anxiety disorder Maternal Aunt    Anxiety disorder Maternal Grandmother    Bipolar disorder Other    Emphysema Father    Diabetes Sister    Hyperlipidemia Sister    Cirrhosis Sister    Hyperlipidemia Sister    Diabetes Sister    Diabetes Maternal Uncle    Dementia Neg Hx     OCD Neg Hx    Paranoid behavior Neg Hx    Physical abuse Neg Hx    Schizophrenia Neg Hx    Seizures Neg Hx    Sexual abuse Neg Hx    Colon cancer Neg Hx    Colon polyps Neg Hx    Pancreatic cancer Neg Hx    Esophageal cancer Neg Hx    Stomach cancer Neg Hx    Kidney disease Neg Hx    Liver disease Neg Hx    Rectal cancer Neg Hx     Social History   Socioeconomic History   Marital status: Married    Spouse name: Darina   Number of children: 2   Years of education: Not on file   Highest education level: Not on file  Occupational History   Not on file  Tobacco Use   Smoking status: Former    Current packs/day: 0.00    Types: Cigarettes    Start date: 09/04/1967    Quit date: 09/03/1997    Years since quitting: 27.2    Passive exposure: Past   Smokeless tobacco: Former    Types: Snuff    Quit date: 05/2022   Tobacco comments:    call when ready to quit    06/14/2022 patient stated she quit 5 or so weeks ago  Vaping Use   Vaping status: Never Used  Substance and Sexual Activity   Alcohol use: No    Alcohol/week: 0.0 standard drinks of alcohol   Drug use: No   Sexual activity: Not on file  Other Topics Concern   Not on file  Social History Narrative   Married 2 sons 1 lives in [place]  1 lives in [place]   She is a housewife   No alcohol tobacco or drug use at this time   Former smoker quit in 1998   Social Drivers of Health   Financial Resource Strain: Low Risk  (11/20/2023)   Overall Financial Resource Strain (CARDIA)    Difficulty of Paying Living Expenses: Not hard at all  Food Insecurity: No Food Insecurity (11/20/2023)   Hunger Vital Sign    Worried About Running Out of Food in the Last Year: Never true    Ran Out of Food in the Last Year: Never true  Transportation Needs: No Transportation Needs (11/20/2023)   PRAPARE - Administrator, Civil Service (Medical): No    Lack of Transportation (Non-Medical): No  Physical Activity:  Inactive (11/20/2023)   Exercise Vital Sign  Days of Exercise per Week: 0 days    Minutes of Exercise per Session: 0 min  Stress: No Stress Concern Present (11/20/2023)   [employer] of Occupational Health - Occupational Stress Questionnaire    Feeling of Stress : Not at all  Social Connections: Socially Integrated (11/20/2023)   Social Connection and Isolation Panel    Frequency of Communication with Friends and Family: More than three times a week    Frequency of Social Gatherings with Friends and Family: More than three times a week    Attends Religious Services: More than 4 times per year    Active Member of [employer] or Organizations: Yes    Attends Engineer, Structural: More than 4 times per year    Marital Status: Married  Catering Manager Violence: Not At Risk (11/20/2023)   Humiliation, Afraid, Rape, and Kick questionnaire    Fear of Current or Ex-Partner: No    Emotionally Abused: No    Physically Abused: No    Sexually Abused: No    Outpatient Medications Prior to Visit  Medication Sig Dispense Refill   baclofen  (LIORESAL ) 10 MG tablet TAKE 1 TABLET BY MOUTH TWICE A DAY AS NEEDED FOR MUSCLE SPASMS 180 tablet 1   BIOTIN PO Take 500 mg by mouth daily.     Calcium  Carb-Cholecalciferol (CALCIUM  600 + D PO) Take 1 tablet by mouth every evening.     carvedilol  (COREG ) 25 MG tablet Take 1 tablet (25 mg total) by mouth 2 (two) times daily. 180 tablet 1   ferrous sulfate  325 (65 FE) MG EC tablet Take 1 tablet (325 mg total) by mouth 2 (two) times daily. 60 tablet 5   furosemide  (LASIX ) 40 MG tablet TAKE 1 TABLET BY MOUTH EVERY DAY 90 tablet 1   glipiZIDE  (GLUCOTROL ) 10 MG tablet TAKE 1 TABLET (10 MG TOTAL) BY MOUTH TWICE A DAY BEFORE A MEAL 180 tablet 1   Glucagon  (GVOKE HYPOPEN  2-PACK) 1 MG/0.2ML SOAJ Inject 1 mg into the skin as needed (For blood glucose < 53). 0.4 mL 5   Insulin  Pen Needle (PEN NEEDLES) 32G X 4 MM MISC Use as directed 4 times a day for injecting insulin   100 each 3   losartan  (COZAAR ) 25 MG tablet Take 1 tablet (25 mg total) by mouth daily. 30 tablet 0   ondansetron  (ZOFRAN ) 4 MG tablet Take 1 tablet (4 mg total) by mouth every 8 (eight) hours as needed for nausea or vomiting. 20 tablet 0   QUEtiapine  (SEROQUEL ) 50 MG tablet TAKE 1 TABLET BY MOUTH TWICE A DAY (Patient taking differently: Take 50 mg by mouth daily.) 180 tablet 1   triamcinolone  cream (KENALOG ) 0.1 % APPLY TO AFFECTED AREA TWICE A DAY 30 g 0   Blood Glucose Monitoring Suppl DEVI 1 each by Does not apply route in the morning, at noon, and at bedtime. May substitute to any manufacturer covered by patient's insurance. 1 each 0   glucose blood (ACCU-CHEK GUIDE TEST) test strip USE AS DIRECTED 100 strip 0   insulin  glargine, 2 Unit Dial, (TOUJEO  MAX SOLOSTAR) 300 UNIT/ML Solostar Pen Inject 40 Units into the skin every evening. 3 mL 3   Lancets MISC 1 each by Does not apply route as directed. Dispense based on patient and insurance preference. Use up to four times daily as directed. (FOR ICD-10 E10.9, E11.9). 100 each 2   rosuvastatin  (CRESTOR ) 5 MG tablet TAKE 1 TABLET (5 MG TOTAL) BY MOUTH DAILY. TAKES ONE ON  MONDAY, WED., AND FRIDAY 45 tablet 1   No facility-administered medications prior to visit.    Allergies  Allergen Reactions   Codeine Other (See Comments)    Took it years ago and doesn't remember the reaction   Spironolactone  Other (See Comments)    Reports intolerance , states unable to take the medication    ROS Review of Systems  Constitutional:  Positive for fatigue. Negative for chills and fever.  HENT:  Negative for congestion, sinus pressure, sinus pain and sore throat.   Eyes:  Negative for pain and discharge.  Respiratory:  Negative for cough and shortness of breath.   Cardiovascular:  Negative for chest pain and palpitations.  Gastrointestinal:  Negative for diarrhea, nausea and vomiting.  Endocrine: Negative for polydipsia and polyuria.  Genitourinary:   Negative for dysuria and hematuria.  Musculoskeletal:  Positive for arthralgias. Negative for neck pain and neck stiffness.  Skin:  Negative for rash.  Neurological:  Negative for dizziness and weakness.  Psychiatric/Behavioral:  Positive for dysphoric mood and sleep disturbance. Negative for agitation and behavioral problems. The patient is nervous/anxious.       Objective:    Physical Exam Vitals reviewed.  Constitutional:      General: She is not in acute distress.    Appearance: She is not diaphoretic.  HENT:     Head: Normocephalic and atraumatic.     Nose: Nose normal.     Mouth/Throat:     Mouth: Mucous membranes are moist.  Eyes:     General: No scleral icterus.    Extraocular Movements: Extraocular movements intact.  Cardiovascular:     Rate and Rhythm: Normal rate and regular rhythm.     Pulses: Normal pulses.     Heart sounds: Murmur (Systolic over left upper sternal border) heard.  Pulmonary:     Breath sounds: Normal breath sounds. No wheezing or rales.  Musculoskeletal:     Right shoulder: No swelling. Decreased range of motion.     Cervical back: Neck supple. No tenderness.     Right lower leg: No edema.     Left lower leg: No edema.  Skin:    General: Skin is warm.     Findings: No rash.  Neurological:     General: No focal deficit present.     Mental Status: She is alert and oriented to person, place, and time.  Psychiatric:        Mood and Affect: Mood normal.        Behavior: Behavior normal.     BP 108/63   Pulse 60   Ht 5' 4 (1.626 m)   Wt 131 lb 9.6 oz (59.7 kg)   SpO2 100%   BMI 22.59 kg/m  Wt Readings from Last 3 Encounters:  11/10/24 131 lb 9.6 oz (59.7 kg)  10/26/24 132 lb (59.9 kg)  10/06/24 134 lb 3.2 oz (60.9 kg)    Lab Results  Component Value Date   TSH 3.100 07/06/2024   Lab Results  Component Value Date   WBC 3.9 (L) 09/30/2024   HGB 11.5 (L) 09/30/2024   HCT 34.6 (L) 09/30/2024   MCV 82.8 09/30/2024   PLT 89 (L)  09/30/2024   Lab Results  Component Value Date   NA 136 09/30/2024   K 4.2 09/30/2024   CO2 27 09/30/2024   GLUCOSE 227 (H) 09/30/2024   BUN 40 (H) 09/30/2024   CREATININE 1.80 (H) 09/30/2024   BILITOT 0.6 09/30/2024   ALKPHOS 65  09/30/2024   AST 25 09/30/2024   ALT 9 09/30/2024   PROT 8.6 (H) 09/30/2024   ALBUMIN  4.3 09/30/2024   CALCIUM  10.1 09/30/2024   ANIONGAP 11 09/30/2024   EGFR 34 (L) 07/06/2024   GFR 48.42 (L) 04/25/2022   Lab Results  Component Value Date   CHOL 216 (H) 11/05/2024   Lab Results  Component Value Date   HDL 34 (L) 11/05/2024   Lab Results  Component Value Date   LDLCALC 157 (H) 11/05/2024   Lab Results  Component Value Date   TRIG 134 11/05/2024   Lab Results  Component Value Date   CHOLHDL 6.4 (H) 11/05/2024   Lab Results  Component Value Date   HGBA1C 9.3 (H) 09/30/2024      Assessment & Plan:   Problem List Items Addressed This Visit       Cardiovascular and Mediastinum   Essential hypertension   Relevant Medications   pravastatin  (PRAVACHOL ) 10 MG tablet   Other Relevant Orders   CMP14+EGFR   CBC with Differential/Platelet     Digestive   Cirrhosis of liver with ascites (HCC)   Had paracentesis (05/24) - 2.4 liters of clear fluid removed Has recurrence of ascites, had discussed about portacaval shunt versus repeated paracentesis with GI - but has been better since taking Lasix  regularly  Last EGD showed portal hypertensive gastropathy and esophageal varices On B-blocker for ppx On Lasix  40 mg QD, did not tolerate spironolactone       Portal hypertensive gastropathy (HCC)   On Coreg  now Followed by GI        Endocrine   Type 2 diabetes mellitus with other specified complication (HCC) - Primary   Lab Results  Component Value Date   HGBA1C 9.3 (H) 09/30/2024   Associated with HTN and HLD On Glipizide  10 mg BID and Toujeo  40 U QD, increased dose of Toujeo  to 48 U once daily Reduced evening dose of Glipizide  to  5 mg, continue 10 mg in AM Uncontrolled due to inconsistent diet, but agrees to avoid sweets and soft drinks Advised to increase fluid intake and take about 15 minutes walk if she has hyperglycemia above 250 after drinking water Did not tolerate Metformin  and Januvia  in the past Considering her history of liver cirrhosis and chronic nausea, would avoid GLP-1 agonist therapy Strictly advised to check blood glucose regularly and contact if blood glucose more than 300 or less than 70  She would benefit from CGM - sent freestyle libre 3 plus, but she does not wish to use it for now Gvoke hypopen  prescribed for hypoglycemia Advised to follow diabetic diet On ACEi and statin F/u CMP and HbA1c Diabetic eye exam: Advised to follow up with Ophthalmology for diabetic eye exam      Relevant Medications   pravastatin  (PRAVACHOL ) 10 MG tablet   insulin  glargine, 2 Unit Dial, (TOUJEO  MAX SOLOSTAR) 300 UNIT/ML Solostar Pen   Blood Glucose Monitoring Suppl DEVI   Glucose Blood (BLOOD GLUCOSE TEST STRIPS) STRP   Lancets MISC   Other Relevant Orders   CMP14+EGFR   Hemoglobin A1c   Urine Microalbumin w/creat. ratio     Other   Mixed hyperlipidemia   On Crestor  5 mg QD QOD Checked lipid profile      Relevant Medications   pravastatin  (PRAVACHOL ) 10 MG tablet   Other Relevant Orders   Lipid Profile   Major depressive disorder, recurrent episode, moderate (HCC)   Overall better controlled now Continue Seroquel  50 mg at  bedtime, can take second dose as needed for agitation DC Prozac  as she had excessive sweating - concern for serotonin syndrome Her major concern is that her husband remains busy with his cars and does not spend time with her.  No current concern of physical or verbal abuse.  Had lengthy discussion to find alternative activities, she denies BH therapy as she wants her husband to get therapy      Other Visit Diagnoses       Encounter for immunization       Relevant Orders   Flu  vaccine HIGH DOSE PF(Fluzone Trivalent) (Completed)         Meds ordered this encounter  Medications   pravastatin  (PRAVACHOL ) 10 MG tablet    Sig: Take 1 tablet (10 mg total) by mouth every other day.    Dispense:  45 tablet    Refill:  1   insulin  glargine, 2 Unit Dial, (TOUJEO  MAX SOLOSTAR) 300 UNIT/ML Solostar Pen    Sig: Inject 48 Units into the skin every evening.    Dispense:  3 mL    Refill:  3   Blood Glucose Monitoring Suppl DEVI    Sig: 1 each by Does not apply route as directed. Dispense based on patient and insurance preference. Use up to four times daily as directed. (FOR ICD-10 E10.9, E11.9).    Dispense:  1 each    Refill:  0   Glucose Blood (BLOOD GLUCOSE TEST STRIPS) STRP    Sig: 1 each by Does not apply route as directed. Dispense based on patient and insurance preference. Use up to four times daily as directed. (FOR ICD-10 E10.9, E11.9).    Dispense:  100 strip    Refill:  2   Lancets MISC    Sig: 1 each by Does not apply route as directed. Dispense based on patient and insurance preference. Use up to four times daily as directed. (FOR ICD-10 E10.9, E11.9).    Dispense:  100 each    Refill:  2    Follow-up: Return in about 3 months (around 02/10/2025) for DM.    Suzzane MARLA Blanch, MD

## 2024-11-10 NOTE — Assessment & Plan Note (Addendum)
 Lab Results  Component Value Date   HGBA1C 9.3 (H) 09/30/2024   Associated with HTN and HLD On Glipizide  10 mg BID and Toujeo  40 U QD, increased dose of Toujeo  to 48 U once daily Reduced evening dose of Glipizide  to 5 mg, continue 10 mg in AM Uncontrolled due to inconsistent diet, but agrees to avoid sweets and soft drinks Advised to increase fluid intake and take about 15 minutes walk if she has hyperglycemia above 250 after drinking water Did not tolerate Metformin  and Januvia  in the past Considering her history of liver cirrhosis and chronic nausea, would avoid GLP-1 agonist therapy Strictly advised to check blood glucose regularly and contact if blood glucose more than 300 or less than 70  She would benefit from CGM - sent freestyle libre 3 plus, but she does not wish to use it for now Gvoke hypopen  prescribed for hypoglycemia Advised to follow diabetic diet On ACEi and statin F/u CMP and HbA1c Diabetic eye exam: Advised to follow up with Ophthalmology for diabetic eye exam

## 2024-11-10 NOTE — Assessment & Plan Note (Signed)
 On Crestor  5 mg QD QOD Checked lipid profile

## 2024-11-12 LAB — MICROALBUMIN / CREATININE URINE RATIO
Creatinine, Urine: 47.7 mg/dL
Microalb/Creat Ratio: 33 mg/g{creat} — ABNORMAL HIGH (ref 0–29)
Microalbumin, Urine: 15.6 ug/mL

## 2024-11-16 ENCOUNTER — Telehealth: Payer: Self-pay | Admitting: Internal Medicine

## 2024-11-16 NOTE — Telephone Encounter (Unsigned)
 Copied from CRM #[id number]. Topic: Clinical - Medication Refill >> Nov 16, 2024  1:44 PM [name] [name] wrote: Medication: losartan  (COZAAR ) 25 MG tablet  Has the patient contacted their pharmacy? No  This is the patient's preferred pharmacy:  [id number] - [place], [place] - [address] AT [facility] [address] [place] [address] Phone: [phone] Fax: [phone]  Is this the correct pharmacy for this prescription? Yes  Has the prescription been filled recently? Yes  Is the patient out of the medication? No  Has the patient been seen for an appointment in the last year OR does the patient have an upcoming appointment? Yes  Can we respond through MyChart? No  Agent: Please be advised that Rx refills may take up to 3 business days. We ask that you follow-up with your pharmacy.

## 2024-11-17 MED ORDER — LOSARTAN POTASSIUM 25 MG PO TABS: 25.0000 mg | ORAL_TABLET | Freq: Every day | ORAL | 0 refills | Status: DC

## 2024-11-23 ENCOUNTER — Telehealth: Payer: Self-pay | Admitting: Internal Medicine

## 2024-11-23 DIAGNOSIS — F331 Major depressive disorder, recurrent, moderate: Secondary | ICD-10-CM

## 2024-11-23 NOTE — Telephone Encounter (Unsigned)
 Copied from CRM #[id number]. Topic: Clinical - Medication Refill >> Nov 23, 2024 10:48 AM Eva FALCON wrote: Medication:  QUEtiapine  (SEROQUEL ) 50 MG tablet   Has the patient contacted their pharmacy? Yes, unable to get connected to anyone at pharmacy. (Agent: If no, request that the patient contact the pharmacy for the refill. If patient does not wish to contact the pharmacy document the reason why and proceed with request.) (Agent: If yes, when and what did the pharmacy advise?)  This is the patient's preferred pharmacy:  [id number] - [place], [place] - [address] AT [facility] [address] [place] [address] Phone: [phone] Fax: [phone]  Is this the correct pharmacy for this prescription? Yes If no, delete pharmacy and type the correct one.   Has the prescription been filled recently? No  Is the patient out of the medication? Yes  Has the patient been seen for an appointment in the last year OR does the patient have an upcoming appointment? Yes  Can we respond through MyChart? No  Agent: Please be advised that Rx refills may take up to 3 business days. We ask that you follow-up with your pharmacy.

## 2024-11-24 MED ORDER — QUETIAPINE FUMARATE 50 MG PO TABS: 50.0000 mg | ORAL_TABLET | Freq: Two times a day (BID) | ORAL | 1 refills | Status: AC

## 2024-12-03 ENCOUNTER — Ambulatory Visit (INDEPENDENT_AMBULATORY_CARE_PROVIDER_SITE_OTHER): Admitting: Gastroenterology

## 2024-12-08 DIAGNOSIS — K7460 Unspecified cirrhosis of liver: Secondary | ICD-10-CM

## 2024-12-18 ENCOUNTER — Other Ambulatory Visit: Payer: Self-pay | Admitting: Internal Medicine

## 2024-12-19 ENCOUNTER — Other Ambulatory Visit: Payer: Self-pay | Admitting: Internal Medicine

## 2024-12-19 DIAGNOSIS — I10 Essential (primary) hypertension: Secondary | ICD-10-CM

## 2024-12-22 ENCOUNTER — Encounter: Payer: Self-pay | Admitting: Internal Medicine

## 2024-12-22 ENCOUNTER — Ambulatory Visit: Admitting: Internal Medicine

## 2024-12-22 VITALS — BP 114/62 | HR 62 | Ht 64.0 in | Wt 127.0 lb

## 2024-12-22 DIAGNOSIS — I8510 Secondary esophageal varices without bleeding: Secondary | ICD-10-CM

## 2024-12-22 DIAGNOSIS — K766 Portal hypertension: Secondary | ICD-10-CM

## 2024-12-22 DIAGNOSIS — R188 Other ascites: Secondary | ICD-10-CM

## 2024-12-22 DIAGNOSIS — E1169 Type 2 diabetes mellitus with other specified complication: Secondary | ICD-10-CM | POA: Diagnosis not present

## 2024-12-22 DIAGNOSIS — K3189 Other diseases of stomach and duodenum: Secondary | ICD-10-CM

## 2024-12-22 DIAGNOSIS — I10 Essential (primary) hypertension: Secondary | ICD-10-CM | POA: Diagnosis not present

## 2024-12-22 DIAGNOSIS — F331 Major depressive disorder, recurrent, moderate: Secondary | ICD-10-CM | POA: Diagnosis not present

## 2024-12-22 DIAGNOSIS — Z794 Long term (current) use of insulin: Secondary | ICD-10-CM

## 2024-12-22 DIAGNOSIS — K7460 Unspecified cirrhosis of liver: Secondary | ICD-10-CM | POA: Diagnosis not present

## 2024-12-22 MED ORDER — CITALOPRAM HYDROBROMIDE 10 MG PO TABS: 10.0000 mg | ORAL_TABLET | Freq: Every day | ORAL | 3 refills | Status: DC

## 2024-12-22 NOTE — Assessment & Plan Note (Signed)
 BP Readings from Last 1 Encounters:  12/22/24 114/62   Well-controlled with Coreg  25 mg BID and Lasix  40 mg QD She has stopped taking losartan  now On Lasix  40 mg daily for cirrhosis related ascites, but she has stopped spironolactone  due to myalgias Counseled for compliance with the medications Advised DASH diet and moderate exercise/walking, at least 150 mins/week

## 2024-12-22 NOTE — Assessment & Plan Note (Signed)
 Lab Results  Component Value Date   HGBA1C 9.3 (H) 09/30/2024   Associated with HTN and HLD On Glipizide  10 mg QAM and 5 mg QPM and Toujeo  48 U QD Uncontrolled due to inconsistent diet, but agrees to avoid sweets and soft drinks Advised to increase fluid intake and take about 15 minutes walk if she has hyperglycemia above 250 after drinking water Did not tolerate Metformin  and Januvia  in the past Considering her history of liver cirrhosis and chronic nausea, would avoid GLP-1 agonist therapy Strictly advised to check blood glucose regularly and contact if blood glucose more than 300 or less than 70  She would benefit from CGM - sent freestyle libre 3 plus, but she does not wish to use it for now Advised to follow diabetic diet On ACEi and statin F/u CMP and HbA1c Diabetic eye exam: Advised to follow up with Ophthalmology for diabetic eye exam

## 2024-12-22 NOTE — Patient Instructions (Addendum)
 Please start taking Citalopram  10 mg once daily.  Please take Seroquel  at bedtime.  You are being referred to Behavioral therapist.

## 2024-12-22 NOTE — Assessment & Plan Note (Signed)
On Coreg now ?Followed by GI ?

## 2024-12-22 NOTE — Progress Notes (Signed)
 "  Established Patient Office Visit  Subjective:  Patient ID: [name] [name], female    DOB: 1951/09/29  Age: 74 y.o. MRN: [id number]  CC:  Chief Complaint  Patient presents with   Depression    Follow up    HPI [name] [name] is a 74 y.o. female with past medical history of NASH related liver cirrhosis, portal hypertensive gastropathy, esophageal varices, HTN, AS, DM, HLD, MGUS, iron deficiency anemia, depression and anxiety who presents for f/u of her MDD and DM.  DM: Her HbA1c had improved to 9.3 in 10/25 from 11.7 in 05/25. She is taking glipizide  10 mg QAM and 5 mg QPM and she has been taking Toujeo  48 units QD since the last visit. She admits that she still needs to improve her diet, does not like healthy food.  She did did not tolerate metformin  and Januvia  in the past.  She met with the nutritionist, was given CGM, but did not like it due to repeated alarms of hyperglycemia.  Her blood glucose levels have worsened recently due to noncompliance to diet, still has postmeal hyperglycemia up to 250s.  She denies any episode of hypoglycemia recently.  Denies any polyuria or polyphagia currently.  HTN: Her BP is WNL today. She takes Lasix  40 mg QD and Coreg  25 mg BID (for liver cirrhosis and ascites).  She has stopped taking losartan .  She did not take spironolactone  due to myalgias.  She denies any headache, dizziness, chest pain, dyspnea or palpitations.  NASH related liver cirrhosis: She has recurrent ascites, had paracentesis on 05/05/23 -about 2.4 L fluid was removed.  Of note, she has not been taking spironolactone  and takes only 40 mg of Lasix  currently.  MDD: She was placed on Seroquel  50 mg qHS for resistant depression and insomnia.  She still complains of apathy, lack of interest in routine activities and lack of appetite, worse recently during holidays.  She still complains of anhedonia and agitation, but denies crying spells.  She also has spells of anxiety due to arguments with  her husband. She denies SI or HI currently.  She has tried Zoloft , Lexapro , Effexor  and Rexulti  before.     Past Medical History:  Diagnosis Date   Allergy    Anemia, unspecified    Anxiety    Arthritis    Chronic diarrhea    Cirrhosis (HCC)    Depression    Depression    Elevated cholesterol    Enterocolitis due to Clostridium difficile, not specified as recurrent    Gastro-esophageal reflux disease with esophagitis    Helicobacter pylori gastritis 07/10/2017   Dx EGD -  1) Omeprazole  20 mg 2 times a day x 14 d 2) Pepto Bismol 2 tabs (262 mg each) 4 times a day x 14 d 3) Metronidazole  250 mg 4 times a day x 14 d 4) doxycycline  100 mg 2 times a day x 14 d  After 14 d stop omeprazole  also  In 4 weeks after treatment completed do H. Pylori stool antigen - dx H. Pylori gastritis    Hx of adenomatous colonic polyps 10/05/2015   Hyperlipemia 02/21/2009   NUC STRESS TEST-NORMAL- EF 74%   Hypertension    Iron deficiency anemia    Irritable bowel syndrome    Localized edema    Major depressive disorder, single episode, unspecified    MVP (mitral valve prolapse)    Rectocele    Thrombocytopenia    Type 2 diabetes mellitus with hyperglycemia (HCC)  Past Surgical History:  Procedure Laterality Date   ABDOMINAL HYSTERECTOMY     BILATERAL SALPINGOOPHORECTOMY     carpel tunnel Bilateral 12/17/2009   COLONOSCOPY     ESOPHAGOGASTRODUODENOSCOPY     IR PARACENTESIS  05/03/2022   IR RADIOLOGIST EVAL & MGMT  11/12/2022   TUBAL LIGATION      Family History  Problem Relation Age of Onset   Anxiety disorder Mother    ADD / ADHD Brother    Alcohol abuse Brother        x 3   Drug abuse Brother    Anxiety disorder Maternal Aunt    Anxiety disorder Maternal Grandmother    Bipolar disorder Other    Emphysema Father    Diabetes Sister    Hyperlipidemia Sister    Cirrhosis Sister    Hyperlipidemia Sister    Diabetes Sister    Diabetes Maternal Uncle    Dementia Neg Hx    OCD Neg Hx     Paranoid behavior Neg Hx    Physical abuse Neg Hx    Schizophrenia Neg Hx    Seizures Neg Hx    Sexual abuse Neg Hx    Colon cancer Neg Hx    Colon polyps Neg Hx    Pancreatic cancer Neg Hx    Esophageal cancer Neg Hx    Stomach cancer Neg Hx    Kidney disease Neg Hx    Liver disease Neg Hx    Rectal cancer Neg Hx     Social History   Socioeconomic History   Marital status: Married    Spouse name: Darina   Number of children: 2   Years of education: Not on file   Highest education level: Not on file  Occupational History   Not on file  Tobacco Use   Smoking status: Former    Current packs/day: 0.00    Types: Cigarettes    Start date: 09/04/1967    Quit date: 09/03/1997    Years since quitting: 27.3    Passive exposure: Past   Smokeless tobacco: Former    Types: Snuff    Quit date: 05/2022   Tobacco comments:    call when ready to quit    06/14/2022 patient stated she quit 5 or so weeks ago  Vaping Use   Vaping status: Never Used  Substance and Sexual Activity   Alcohol use: No    Alcohol/week: 0.0 standard drinks of alcohol   Drug use: No   Sexual activity: Not on file  Other Topics Concern   Not on file  Social History Narrative   Married 2 sons 1 lives in [place]  1 lives in [place]   She is a housewife   No alcohol tobacco or drug use at this time   Former smoker quit in 1998   Social Drivers of Health   Tobacco Use: Medium Risk (12/22/2024)   Patient History    Smoking Tobacco Use: Former    Smokeless Tobacco Use: Former    Passive Exposure: Past  Physicist, Medical Strain: Low Risk (11/20/2023)   Overall Financial Resource Strain (CARDIA)    Difficulty of Paying Living Expenses: Not hard at all  Food Insecurity: No Food Insecurity (11/20/2023)   Hunger Vital Sign    Worried About Running Out of Food in the Last Year: Never true    Ran Out of Food in the Last Year: Never true  Transportation Needs: No Transportation Needs (11/20/2023)    PRAPARE - Transportation  Lack of Transportation (Medical): No    Lack of Transportation (Non-Medical): No  Physical Activity: Inactive (11/20/2023)   Exercise Vital Sign    Days of Exercise per Week: 0 days    Minutes of Exercise per Session: 0 min  Stress: No Stress Concern Present (11/20/2023)   [employer] of Occupational Health - Occupational Stress Questionnaire    Feeling of Stress : Not at all  Social Connections: Socially Integrated (11/20/2023)   Social Connection and Isolation Panel    Frequency of Communication with Friends and Family: More than three times a week    Frequency of Social Gatherings with Friends and Family: More than three times a week    Attends Religious Services: More than 4 times per year    Active Member of Clubs or Organizations: Yes    Attends Banker Meetings: More than 4 times per year    Marital Status: Married  Catering Manager Violence: Not At Risk (11/20/2023)   Humiliation, Afraid, Rape, and Kick questionnaire    Fear of Current or Ex-Partner: No    Emotionally Abused: No    Physically Abused: No    Sexually Abused: No  Depression (PHQ2-9): High Risk (12/22/2024)   Depression (PHQ2-9)    PHQ-2 Score: 11  Alcohol Screen: Low Risk (11/20/2023)   Alcohol Screen    Last Alcohol Screening Score (AUDIT): 0  Housing: Low Risk (11/20/2023)   Housing    Last Housing Risk Score: 0  Utilities: Not At Risk (11/20/2023)   AHC Utilities    Threatened with loss of utilities: No  Health Literacy: Adequate Health Literacy (11/20/2023)   B1300 Health Literacy    Frequency of need for help with medical instructions: Never    Outpatient Medications Prior to Visit  Medication Sig Dispense Refill   baclofen  (LIORESAL ) 10 MG tablet TAKE 1 TABLET BY MOUTH TWICE A DAY AS NEEDED FOR MUSCLE SPASMS 180 tablet 1   BIOTIN PO Take 500 mg by mouth daily.     Blood Glucose Monitoring Suppl DEVI 1 each by Does not apply route as directed. Dispense based  on patient and insurance preference. Use up to four times daily as directed. (FOR ICD-10 E10.9, E11.9). 1 each 0   Calcium  Carb-Cholecalciferol (CALCIUM  600 + D PO) Take 1 tablet by mouth every evening.     carvedilol  (COREG ) 25 MG tablet TAKE 1 TABLET BY MOUTH TWICE A DAY 180 tablet 1   ferrous sulfate  325 (65 FE) MG EC tablet Take 1 tablet (325 mg total) by mouth 2 (two) times daily. 60 tablet 5   furosemide  (LASIX ) 40 MG tablet TAKE 1 TABLET BY MOUTH EVERY DAY 90 tablet 1   glipiZIDE  (GLUCOTROL ) 10 MG tablet TAKE 1 TABLET (10 MG TOTAL) BY MOUTH TWICE A DAY BEFORE A MEAL 180 tablet 1   Glucagon  (GVOKE HYPOPEN  2-PACK) 1 MG/0.2ML SOAJ Inject 1 mg into the skin as needed (For blood glucose < 53). 0.4 mL 5   Glucose Blood (BLOOD GLUCOSE TEST STRIPS) STRP 1 each by Does not apply route as directed. Dispense based on patient and insurance preference. Use up to four times daily as directed. (FOR ICD-10 E10.9, E11.9). 100 strip 2   insulin  glargine, 2 Unit Dial, (TOUJEO  MAX SOLOSTAR) 300 UNIT/ML Solostar Pen Inject 48 Units into the skin every evening. 3 mL 3   Insulin  Pen Needle (PEN NEEDLES) 32G X 4 MM MISC Use as directed 4 times a day for injecting insulin  100 each 3  Lancets MISC 1 each by Does not apply route as directed. Dispense based on patient and insurance preference. Use up to four times daily as directed. (FOR ICD-10 E10.9, E11.9). 100 each 2   ondansetron  (ZOFRAN ) 4 MG tablet Take 1 tablet (4 mg total) by mouth every 8 (eight) hours as needed for nausea or vomiting. 20 tablet 0   pravastatin  (PRAVACHOL ) 10 MG tablet Take 1 tablet (10 mg total) by mouth every other day. 45 tablet 1   QUEtiapine  (SEROQUEL ) 50 MG tablet Take 1 tablet (50 mg total) by mouth 2 (two) times daily. 180 tablet 1   triamcinolone  cream (KENALOG ) 0.1 % APPLY TO AFFECTED AREA TWICE A DAY 30 g 0   losartan  (COZAAR ) 25 MG tablet TAKE 1 TABLET (25 MG TOTAL) BY MOUTH DAILY. 90 tablet 1   No facility-administered medications  prior to visit.    Allergies  Allergen Reactions   Codeine Other (See Comments)    Took it years ago and doesn't remember the reaction   Spironolactone  Other (See Comments)    Reports intolerance , states unable to take the medication    ROS Review of Systems  Constitutional:  Positive for fatigue. Negative for chills and fever.  HENT:  Negative for congestion, sinus pressure, sinus pain and sore throat.   Eyes:  Negative for pain and discharge.  Respiratory:  Negative for cough and shortness of breath.   Cardiovascular:  Negative for chest pain and palpitations.  Gastrointestinal:  Negative for diarrhea, nausea and vomiting.  Endocrine: Negative for polydipsia and polyuria.  Genitourinary:  Negative for dysuria and hematuria.  Musculoskeletal:  Positive for arthralgias. Negative for neck pain and neck stiffness.  Skin:  Negative for rash.  Neurological:  Negative for dizziness and weakness.  Psychiatric/Behavioral:  Positive for dysphoric mood and sleep disturbance. Negative for agitation and behavioral problems. The patient is nervous/anxious.       Objective:    Physical Exam Vitals reviewed.  Constitutional:      General: She is not in acute distress.    Appearance: She is not diaphoretic.  HENT:     Head: Normocephalic and atraumatic.     Nose: Nose normal.     Mouth/Throat:     Mouth: Mucous membranes are moist.  Eyes:     General: No scleral icterus.    Extraocular Movements: Extraocular movements intact.  Cardiovascular:     Rate and Rhythm: Normal rate and regular rhythm.     Pulses: Normal pulses.     Heart sounds: Murmur (Systolic over left upper sternal border) heard.  Pulmonary:     Breath sounds: Normal breath sounds. No wheezing or rales.  Musculoskeletal:     Right shoulder: No swelling. Decreased range of motion.     Cervical back: Neck supple. No tenderness.     Right lower leg: No edema.     Left lower leg: No edema.  Skin:    General: Skin is  warm.     Findings: No rash.  Neurological:     General: No focal deficit present.     Mental Status: She is alert and oriented to person, place, and time.  Psychiatric:        Mood and Affect: Mood is depressed.        Behavior: Behavior normal.     BP 114/62   Pulse 62   Ht 5' 4 (1.626 m)   Wt 127 lb (57.6 kg)   SpO2 96%   BMI  21.80 kg/m  Wt Readings from Last 3 Encounters:  12/22/24 127 lb (57.6 kg)  11/10/24 131 lb 9.6 oz (59.7 kg)  10/26/24 132 lb (59.9 kg)    Lab Results  Component Value Date   TSH 3.100 07/06/2024   Lab Results  Component Value Date   WBC 3.9 (L) 09/30/2024   HGB 11.5 (L) 09/30/2024   HCT 34.6 (L) 09/30/2024   MCV 82.8 09/30/2024   PLT 89 (L) 09/30/2024   Lab Results  Component Value Date   NA 136 09/30/2024   K 4.2 09/30/2024   CO2 27 09/30/2024   GLUCOSE 227 (H) 09/30/2024   BUN 40 (H) 09/30/2024   CREATININE 1.80 (H) 09/30/2024   BILITOT 0.6 09/30/2024   ALKPHOS 65 09/30/2024   AST 25 09/30/2024   ALT 9 09/30/2024   PROT 8.6 (H) 09/30/2024   ALBUMIN  4.3 09/30/2024   CALCIUM  10.1 09/30/2024   ANIONGAP 11 09/30/2024   EGFR 34 (L) 07/06/2024   GFR 48.42 (L) 04/25/2022   Lab Results  Component Value Date   CHOL 216 (H) 11/05/2024   Lab Results  Component Value Date   HDL 34 (L) 11/05/2024   Lab Results  Component Value Date   LDLCALC 157 (H) 11/05/2024   Lab Results  Component Value Date   TRIG 134 11/05/2024   Lab Results  Component Value Date   CHOLHDL 6.4 (H) 11/05/2024   Lab Results  Component Value Date   HGBA1C 9.3 (H) 09/30/2024      Assessment & Plan:   Problem List Items Addressed This Visit       Cardiovascular and Mediastinum   Essential hypertension   BP Readings from Last 1 Encounters:  12/22/24 114/62   Well-controlled with Coreg  25 mg BID and Lasix  40 mg QD She has stopped taking losartan  now On Lasix  40 mg daily for cirrhosis related ascites, but she has stopped spironolactone  due to  myalgias Counseled for compliance with the medications Advised DASH diet and moderate exercise/walking, at least 150 mins/week      Secondary esophageal varices without bleeding (HCC)   Last EGD showed portal hypertensive gastropathy and esophageal varices On B-blocker for ppx On Lasix  40 mg once daily, did not tolerate spironolactone  25 mg QD        Digestive   Cirrhosis of liver with ascites (HCC)   Had paracentesis (05/24) - 2.4 liters of clear fluid removed Has recurrence of ascites, had discussed about portacaval shunt versus repeated paracentesis with GI - but has been better since taking Lasix  regularly  Last EGD showed portal hypertensive gastropathy and esophageal varices On B-blocker for ppx On Lasix  40 mg QD, did not tolerate spironolactone       Portal hypertensive gastropathy (HCC)   On Coreg  now Followed by GI        Endocrine   Type 2 diabetes mellitus with other specified complication (HCC)   Lab Results  Component Value Date   HGBA1C 9.3 (H) 09/30/2024   Associated with HTN and HLD On Glipizide  10 mg QAM and 5 mg QPM and Toujeo  48 U QD Uncontrolled due to inconsistent diet, but agrees to avoid sweets and soft drinks Advised to increase fluid intake and take about 15 minutes walk if she has hyperglycemia above 250 after drinking water Did not tolerate Metformin  and Januvia  in the past Considering her history of liver cirrhosis and chronic nausea, would avoid GLP-1 agonist therapy Strictly advised to check blood glucose regularly and contact if  blood glucose more than 300 or less than 70  She would benefit from CGM - sent freestyle libre 3 plus, but she does not wish to use it for now Advised to follow diabetic diet On ACEi and statin F/u CMP and HbA1c Diabetic eye exam: Advised to follow up with Ophthalmology for diabetic eye exam        Other   Major depressive disorder, recurrent episode, moderate (HCC) - Primary   Uncontrolled now, ?seasonal  variability Added citalopram  10 mg QD Continue Seroquel  50 mg at bedtime, can take second/daytime dose as needed for agitation Had to DC Prozac  previously as she had excessive sweating Her major concern is that her husband remains busy with his cars and does not spend time with her.  No current concern of physical or verbal abuse.  Had lengthy discussion to find alternative activities  Referred to psychiatry and integrated BH therapy      Relevant Medications   citalopram  (CELEXA ) 10 MG tablet   Other Relevant Orders   Ambulatory referral to Psychiatry   Ambulatory referral to Integrated Behavioral Health      Meds ordered this encounter  Medications   citalopram  (CELEXA ) 10 MG tablet    Sig: Take 1 tablet (10 mg total) by mouth daily.    Dispense:  30 tablet    Refill:  3    Follow-up: Return in about 4 weeks (around 01/19/2025).    Suzzane MARLA Blanch, MD "

## 2024-12-22 NOTE — Assessment & Plan Note (Signed)
 Had paracentesis (05/24) - 2.4 liters of clear fluid removed Has recurrence of ascites, had discussed about portacaval shunt versus repeated paracentesis with GI - but has been better since taking Lasix  regularly  Last EGD showed portal hypertensive gastropathy and esophageal varices On B-blocker for ppx On Lasix  40 mg QD, did not tolerate spironolactone 

## 2024-12-22 NOTE — Assessment & Plan Note (Signed)
 Last EGD showed portal hypertensive gastropathy and esophageal varices On B-blocker for ppx On Lasix  40 mg once daily, did not tolerate spironolactone  25 mg QD

## 2024-12-22 NOTE — Assessment & Plan Note (Addendum)
 Uncontrolled now, ?seasonal variability Added citalopram  10 mg QD Continue Seroquel  50 mg at bedtime, can take second/daytime dose as needed for agitation Had to DC Prozac  previously as she had excessive sweating Her major concern is that her husband remains busy with his cars and does not spend time with her.  No current concern of physical or verbal abuse.  Had lengthy discussion to find alternative activities  Referred to psychiatry and integrated BH therapy

## 2024-12-31 ENCOUNTER — Telehealth: Payer: Self-pay | Admitting: Professional Counselor

## 2024-12-31 NOTE — Telephone Encounter (Signed)
 Appt canceled

## 2024-12-31 NOTE — Telephone Encounter (Signed)
 Copied from CRM #[id number]. Topic: General - Other >> Dec 31, 2024  8:39 AM Lonell PEDLAR wrote: Patient is just looking to cx apt.

## 2025-01-01 ENCOUNTER — Institutional Professional Consult (permissible substitution): Payer: Self-pay | Admitting: Professional Counselor

## 2025-01-11 ENCOUNTER — Ambulatory Visit (INDEPENDENT_AMBULATORY_CARE_PROVIDER_SITE_OTHER): Admitting: Gastroenterology

## 2025-01-13 ENCOUNTER — Other Ambulatory Visit: Payer: Self-pay | Admitting: Internal Medicine

## 2025-01-13 DIAGNOSIS — F331 Major depressive disorder, recurrent, moderate: Secondary | ICD-10-CM

## 2025-02-02 ENCOUNTER — Encounter (INDEPENDENT_AMBULATORY_CARE_PROVIDER_SITE_OTHER): Admitting: Gastroenterology

## 2025-02-12 ENCOUNTER — Ambulatory Visit: Admitting: Internal Medicine
